# Patient Record
Sex: Male | Born: 1960
Health system: Southern US, Community
[De-identification: ages and names within clinical notes are randomized; demographics above are authoritative.]

## PROBLEM LIST (undated history)

## (undated) ENCOUNTER — Inpatient Hospital Stay: Discharge: 2018-03-11 | Payer: PRIVATE HEALTH INSURANCE

## (undated) DIAGNOSIS — M960 Pseudarthrosis after fusion or arthrodesis: Secondary | ICD-10-CM

## (undated) DIAGNOSIS — M47812 Spondylosis without myelopathy or radiculopathy, cervical region: Secondary | ICD-10-CM

## (undated) DIAGNOSIS — M47816 Spondylosis without myelopathy or radiculopathy, lumbar region: Secondary | ICD-10-CM

## (undated) DIAGNOSIS — R202 Paresthesia of skin: Secondary | ICD-10-CM

## (undated) DIAGNOSIS — C9110 Chronic lymphocytic leukemia of B-cell type not having achieved remission: Principal | ICD-10-CM

## (undated) DIAGNOSIS — Z9889 Other specified postprocedural states: Secondary | ICD-10-CM

## (undated) DIAGNOSIS — I70213 Atherosclerosis of native arteries of extremities with intermittent claudication, bilateral legs: Secondary | ICD-10-CM

## (undated) DIAGNOSIS — Z01818 Encounter for other preprocedural examination: Secondary | ICD-10-CM

## (undated) DIAGNOSIS — E049 Nontoxic goiter, unspecified: Secondary | ICD-10-CM

## (undated) DIAGNOSIS — R1010 Upper abdominal pain, unspecified: Secondary | ICD-10-CM

## (undated) DIAGNOSIS — M4312 Spondylolisthesis, cervical region: Secondary | ICD-10-CM

## (undated) DIAGNOSIS — R52 Pain, unspecified: Principal | ICD-10-CM

## (undated) DIAGNOSIS — R1084 Generalized abdominal pain: Principal | ICD-10-CM

## (undated) DIAGNOSIS — F32A Depression, unspecified: Secondary | ICD-10-CM

## (undated) DIAGNOSIS — N419 Inflammatory disease of prostate, unspecified: Secondary | ICD-10-CM

## (undated) DIAGNOSIS — K219 Gastro-esophageal reflux disease without esophagitis: Secondary | ICD-10-CM

## (undated) DIAGNOSIS — R109 Unspecified abdominal pain: Secondary | ICD-10-CM

## (undated) DIAGNOSIS — G8929 Other chronic pain: Secondary | ICD-10-CM

## (undated) DIAGNOSIS — K589 Irritable bowel syndrome without diarrhea: Secondary | ICD-10-CM

## (undated) DIAGNOSIS — I739 Peripheral vascular disease, unspecified: Secondary | ICD-10-CM

## (undated) DIAGNOSIS — K859 Acute pancreatitis without necrosis or infection, unspecified: Secondary | ICD-10-CM

## (undated) DIAGNOSIS — M549 Dorsalgia, unspecified: Secondary | ICD-10-CM

## (undated) DIAGNOSIS — I2510 Atherosclerotic heart disease of native coronary artery without angina pectoris: Secondary | ICD-10-CM

## (undated) DIAGNOSIS — F329 Major depressive disorder, single episode, unspecified: Secondary | ICD-10-CM

## (undated) DIAGNOSIS — E119 Type 2 diabetes mellitus without complications: Secondary | ICD-10-CM

## (undated) DIAGNOSIS — I10 Essential (primary) hypertension: Secondary | ICD-10-CM

## (undated) HISTORY — DX: Inflammatory disease of prostate, unspecified: N41.9

## (undated) HISTORY — DX: Dorsalgia, unspecified: M54.9

## (undated) HISTORY — DX: Other chronic pain: G89.29

## (undated) HISTORY — DX: Peripheral vascular disease, unspecified: I73.9

---

## 2014-08-21 ENCOUNTER — Encounter (HOSPITAL_BASED_OUTPATIENT_CLINIC_OR_DEPARTMENT_OTHER): Payer: Self-pay

## 2014-08-21 ENCOUNTER — Emergency Department (HOSPITAL_BASED_OUTPATIENT_CLINIC_OR_DEPARTMENT_OTHER)
Admission: EM | Admit: 2014-08-21 | Discharge: 2014-08-21 | Disposition: A | Discharge status: Home / Self Care | Payer: Self-pay | Attending: Emergency Medicine | Admitting: Emergency Medicine

## 2014-08-21 DIAGNOSIS — Z79899 Other long term (current) drug therapy: Secondary | ICD-10-CM | POA: Insufficient documentation

## 2014-08-21 DIAGNOSIS — K0889 Other specified disorders of teeth and supporting structures: Secondary | ICD-10-CM

## 2014-08-21 DIAGNOSIS — K029 Dental caries, unspecified: Secondary | ICD-10-CM | POA: Insufficient documentation

## 2014-08-21 DIAGNOSIS — Z720 Tobacco use: Secondary | ICD-10-CM | POA: Insufficient documentation

## 2014-08-21 DIAGNOSIS — K219 Gastro-esophageal reflux disease without esophagitis: Secondary | ICD-10-CM | POA: Insufficient documentation

## 2014-08-21 DIAGNOSIS — I2510 Atherosclerotic heart disease of native coronary artery without angina pectoris: Secondary | ICD-10-CM | POA: Insufficient documentation

## 2014-08-21 DIAGNOSIS — K088 Other specified disorders of teeth and supporting structures: Secondary | ICD-10-CM | POA: Insufficient documentation

## 2014-08-21 DIAGNOSIS — I10 Essential (primary) hypertension: Secondary | ICD-10-CM | POA: Insufficient documentation

## 2014-08-21 DIAGNOSIS — E119 Type 2 diabetes mellitus without complications: Secondary | ICD-10-CM | POA: Insufficient documentation

## 2014-08-21 HISTORY — DX: Irritable bowel syndrome, unspecified: K58.9

## 2014-08-21 HISTORY — DX: Type 2 diabetes mellitus without complications: E11.9

## 2014-08-21 HISTORY — DX: Atherosclerotic heart disease of native coronary artery without angina pectoris: I25.10

## 2014-08-21 HISTORY — DX: Gastro-esophageal reflux disease without esophagitis: K21.9

## 2014-08-21 HISTORY — DX: Essential (primary) hypertension: I10

## 2014-08-21 MED ORDER — PENICILLIN V POTASSIUM 500 MG PO TABS: 1000.0000 mg | ORAL_TABLET | Freq: Two times a day (BID) | ORAL | Status: DC

## 2014-08-21 MED ORDER — BUPIVACAINE HCL 0.25 % IJ SOLN: 10.0000 mL | Freq: Once | INTRAMUSCULAR | Status: DC

## 2014-08-21 MED ORDER — BUPIVACAINE-EPINEPHRINE (PF) 0.5% -1:200000 IJ SOLN
INTRAMUSCULAR | Status: AC
  Administered 2014-08-21: 1.8 mL
  Filled 2014-08-21: qty 1.8

## 2014-08-21 MED ORDER — ACETAMINOPHEN 500 MG PO TABS
1000.0000 mg | ORAL_TABLET | Freq: Once | ORAL | Status: AC
  Administered 2014-08-21: 1000 mg via ORAL
  Filled 2014-08-21: qty 2

## 2014-08-21 MED ORDER — IBUPROFEN 800 MG PO TABS
800.0000 mg | ORAL_TABLET | Freq: Once | ORAL | Status: AC
  Administered 2014-08-21: 800 mg via ORAL
  Filled 2014-08-21: qty 1

## 2014-08-21 MED ORDER — OXYCODONE HCL 5 MG PO TABS
5.0000 mg | ORAL_TABLET | Freq: Once | ORAL | Status: AC
  Administered 2014-08-21: 5 mg via ORAL
  Filled 2014-08-21: qty 1

## 2014-08-21 NOTE — ED Notes (Signed)
Pt reports left sided dental pain, extensive dental decay.  Reports yesterday face was swollen.

## 2014-08-21 NOTE — Discharge Instructions (Signed)
Dental list          updated 1.22.15 These dentists all accept Medicaid.  The list is for your convenience in choosing your childs dentist. Estos dentistas aceptan Medicaid.  La lista es para su [place] y es una cortesa.     [facility]     [phone] [address]  [address] [place] [address] Se habla espaol From 4 to 53 years old Parent may go with child Vinson Moselle DDS     [phone] [address] [place]  [address] Se habla espaol From 44 to 56 years old Parent may NOT go with child  Marolyn Hammock DMD    [phone] [address] [place] [address] Se habla espaol [place] spoken From 55 years old Parent may go with child [facility]     [phone] [address] [place] [address] Se habla espaol From 48 to 23 years old Parent may NOT go with child  Winfield Rast DDS     [phone] [facility]      [address]  Gin[place] [address] No se habla espaol From teeth coming in Parent may go with child  [facility]     [phone] [address] [place]. [place] [address] Requires certification. Call for information. Requiere certificacin. Llame para informacin. Algunos dias se habla espaol  From birth to 20 years Parent possibly goes with child  Bradd Canary DDS     [phone] [id number]  [address] [place] [address] Se habla espaol From 18 months to 18 years  Parent may go with child  J. Cos Cob DDS    [phone] Garlon Hatchet DDS [address] [place] [address] Se habla espaol From 70 year old Parent may go with child  Melynda Ripple DDS    [phone] [address] [place] [address] Se habla espaol  From 32 months old Parent may go with child Dorian Pod DDS    [phone] [address] [place] [address] Se habla espaol From 88 to 87 years old Parent may go with child  [facility]    [phone] [address] [place] [address] No se habla espaol From birth Parent may not go with child     Dental Pain A tooth ache may be caused by cavities (tooth decay). Cavities expose the nerve of the tooth to air and hot or cold temperatures. It may come from an infection or abscess (also called a boil or furuncle) around your tooth. It is also often caused by dental caries (tooth decay). This causes the pain you are having. DIAGNOSIS  Your caregiver can diagnose this problem by exam. TREATMENT   If caused by an infection, it may be treated with medications which kill germs (antibiotics) and pain medications as prescribed by your caregiver. Take medications as directed.  Only take over-the-counter or prescription medicines for pain, discomfort, or fever as directed by your caregiver.  Whether the tooth ache today is caused by infection or dental disease, you should see your dentist as soon as possible for further care. SEEK MEDICAL CARE IF: The exam and treatment you received today has been provided on an emergency basis only. This is not a substitute for complete medical or dental care. If your problem worsens or new problems (symptoms) appear, and you are unable to meet with your dentist, call or return to this location. SEEK IMMEDIATE MEDICAL  CARE IF:   You have a fever.  You develop redness and swelling of your face, jaw, or neck.  You are unable to open your mouth.  You have severe pain uncontrolled by pain medicine. MAKE SURE YOU:   Understand these instructions.  Will watch your condition.  Will get help right away if you are not doing well or get worse. Document Released: 01/09/2005 Document Revised: 04/03/2011 Document Reviewed: 08/28/2007 [facility] Patient Information 2015 [place], [place]. This information is not intended to replace advice given to you by your health care provider. Make sure you discuss any questions you have with your  health care provider.

## 2014-08-21 NOTE — ED Provider Notes (Signed)
CSN: [id number]     Arrival date & time 08/21/14  1609 History   First MD Initiated Contact with Patient 08/21/14 1700     Chief Complaint  Patient presents with  . Dental Pain     (Consider location/radiation/quality/duration/timing/severity/associated sxs/prior Treatment) Patient is a 54 y.o. male presenting with tooth pain. The history is provided by the patient.  Dental Pain Location:  Upper, generalized and lower Upper teeth location:  12/LU 1st bicuspid Lower teeth location:  19/LL 1st molar and 18/LL 2nd molar Quality:  Sharp and constant Severity:  Severe Onset quality:  Gradual Duration:  2 days Timing:  Constant Progression:  Worsening Chronicity:  New Previous work-up:  Dental exam Relieved by:  Nothing Worsened by:  Nothing tried Ineffective treatments:  None tried Associated symptoms: facial pain, facial swelling and gum swelling   Associated symptoms: no congestion, no fever and no headaches     Past Medical History  Diagnosis Date  . Diabetes mellitus without complication   . IBS (irritable bowel syndrome)   . CAD (coronary artery disease)   . Hypertension   . GERD (gastroesophageal reflux disease)    History reviewed. No pertinent past surgical history. No family history on file. History  Substance Use Topics  . Smoking status: Current Every Day Smoker -- 0.50 packs/day    Types: Cigarettes  . Smokeless tobacco: Not on file  . Alcohol Use: No    Review of Systems  Constitutional: Negative for fever and chills.  HENT: Positive for dental problem and facial swelling. Negative for congestion.   Eyes: Negative for discharge and visual disturbance.  Respiratory: Negative for shortness of breath.   Cardiovascular: Negative for chest pain and palpitations.  Gastrointestinal: Negative for vomiting, abdominal pain and diarrhea.  Musculoskeletal: Negative for myalgias and arthralgias.  Skin: Negative for color change and rash.  Neurological: Negative for  tremors, syncope and headaches.  Psychiatric/Behavioral: Negative for confusion and dysphoric mood.      Allergies  Review of patient's allergies indicates no known allergies.  Home Medications   Prior to Admission medications   Medication Sig Start Date End Date Taking? Authorizing Provider  dicyclomine (BENTYL) 10 MG capsule Take 10 mg by mouth 4 (four) times daily -  before meals and at bedtime.   Yes Historical Provider, MD  ibuprofen (ADVIL,MOTRIN) 400 MG tablet Take 400 mg by mouth every 6 (six) hours as needed.   Yes Historical Provider, MD  lisinopril (PRINIVIL,ZESTRIL) 10 MG tablet Take 10 mg by mouth daily.   Yes Historical Provider, MD  metFORMIN (GLUCOPHAGE) 1000 MG tablet Take 1,000 mg by mouth 2 (two) times daily with a meal.   Yes Historical Provider, MD  omeprazole (PRILOSEC) 10 MG capsule Take 10 mg by mouth daily.   Yes Historical Provider, MD  rosuvastatin (CRESTOR) 10 MG tablet Take 10 mg by mouth daily.   Yes Historical Provider, MD  penicillin v potassium (VEETID) 500 MG tablet Take 2 tablets (1,000 mg total) by mouth 2 (two) times daily. X 7 days 08/21/14   Melene Plan, DO   BP 130/70 mmHg  Pulse 82  Temp(Src) 98.1 F (36.7 C) (Oral)  Resp 22  Ht  (1.676 m)  Wt 166 lb (75.297 kg)  BMI 26.81 kg/m2  SpO2 97% Physical Exam  Constitutional: He is oriented to person, place, and time. He appears well-developed and well-nourished.  HENT:  Head: Normocephalic and atraumatic.  Poor dentition multiple dental caries mild fluctuance above left upper canine.  Noted swelling to the floor of the mouth. Able to handle secretions without difficulty.  Eyes: EOM are normal. Pupils are equal, round, and reactive to light.  Neck: Normal range of motion. Neck supple. No JVD present.  Pulmonary/Chest: No respiratory distress.  Musculoskeletal: Normal range of motion.  Neurological: He is alert and oriented to person, place, and time.  Skin: No rash noted. No pallor.   Psychiatric: He has a normal mood and affect. His behavior is normal.    ED Course  Dental Date/Time: 08/21/2014 5:41 PM Performed by: Melene Plan Authorized by: Melene Plan Consent: Verbal consent obtained. Risks and benefits: risks, benefits and alternatives were discussed Consent given by: patient Patient understanding: patient states understanding of the procedure being performed Patient consent: the patient's understanding of the procedure matches consent given Required items: required blood products, implants, devices, and special equipment available Time out: Immediately prior to procedure a "time out" was called to verify the correct patient, procedure, equipment, support staff and site/side marked as required. Local anesthesia used: yes Anesthesia: nerve block Local anesthetic: bupivacaine 0.25% with epinephrine Anesthetic total: 10 ml Patient sedated: no Patient tolerance: Patient tolerated the procedure well with no immediate complications Comments: I&D without drainage  NERVE BLOCK Date/Time: 08/21/2014 5:42 PM Performed by: Melene Plan Authorized by: Melene Plan Consent: Verbal consent obtained. Risks and benefits: risks, benefits and alternatives were discussed Consent given by: patient Required items: required blood products, implants, devices, and special equipment available Indications: pain relief Body area: face/mouth Nerve: inferior alveolar Laterality: left Patient sedated: no Patient position: sitting Needle gauge: 27 G Location technique: anatomical landmarks Local anesthetic: bupivacaine 0.25% with epinephrine Anesthetic total: 5 ml Outcome: pain improved Patient tolerance: Patient tolerated the procedure well with no immediate complications   (including critical care time) Labs Review Labs Reviewed - No data to display  Imaging Review No results found.   EKG Interpretation None      MDM   Final diagnoses:  Pain, dental       54 year old male  with dental pain started 2 days ago. Mild facial swelling has resolved according to patient. Able to handle secretions without difficulty. No signs of ludwigs angina. Dental block was performed at bedside with relief of pain. We'll have the patient follow-up with his dentist. 5:42 PM:  I have discussed the diagnosis/risks/treatment options with the patient and family and believe the pt to be eligible for discharge home to follow-up with Dentist. We also discussed returning to the ED immediately if new or worsening sx occur. We discussed the sx which are most concerning (e.g., sudden worsening pain, fever,nausea) that necessitate immediate return. Medications administered to the patient during their visit and any new prescriptions provided to the patient are listed below.  Medications given during this visit Medications  acetaminophen (TYLENOL) tablet 1,000 mg (1,000 mg Oral Given 08/21/14 1730)  ibuprofen (ADVIL,MOTRIN) tablet 800 mg (800 mg Oral Given 08/21/14 1730)  oxyCODONE (Oxy IR/ROXICODONE) immediate release tablet 5 mg (5 mg Oral Given 08/21/14 1730)  bupivacaine-epinephrine (MARCAINE W/ EPI) 0.5% -1:200000 injection (1.8 mLs  Given 08/21/14 1735)    New Prescriptions   PENICILLIN V POTASSIUM (VEETID) 500 MG TABLET    Take 2 tablets (1,000 mg total) by mouth 2 (two) times daily. X 7 days     The patient appears reasonably screen and/or stabilized for discharge and I doubt any other medical condition or other [facility] requiring further screening, evaluation, or treatment in the ED at this time prior  to discharge.    Melene Plan, DO 08/21/14 1742

## 2014-08-27 ENCOUNTER — Emergency Department (HOSPITAL_BASED_OUTPATIENT_CLINIC_OR_DEPARTMENT_OTHER): Payer: Self-pay

## 2014-08-27 ENCOUNTER — Encounter (HOSPITAL_BASED_OUTPATIENT_CLINIC_OR_DEPARTMENT_OTHER): Payer: Self-pay

## 2014-08-27 ENCOUNTER — Emergency Department (HOSPITAL_BASED_OUTPATIENT_CLINIC_OR_DEPARTMENT_OTHER)
Admission: EM | Admit: 2014-08-27 | Discharge: 2014-08-27 | Disposition: A | Discharge status: Home / Self Care | Payer: Self-pay | Attending: Emergency Medicine | Admitting: Emergency Medicine

## 2014-08-27 DIAGNOSIS — R1084 Generalized abdominal pain: Secondary | ICD-10-CM | POA: Insufficient documentation

## 2014-08-27 DIAGNOSIS — K589 Irritable bowel syndrome without diarrhea: Secondary | ICD-10-CM | POA: Insufficient documentation

## 2014-08-27 DIAGNOSIS — Z720 Tobacco use: Secondary | ICD-10-CM | POA: Insufficient documentation

## 2014-08-27 DIAGNOSIS — Z79899 Other long term (current) drug therapy: Secondary | ICD-10-CM | POA: Insufficient documentation

## 2014-08-27 DIAGNOSIS — I2510 Atherosclerotic heart disease of native coronary artery without angina pectoris: Secondary | ICD-10-CM | POA: Insufficient documentation

## 2014-08-27 DIAGNOSIS — E119 Type 2 diabetes mellitus without complications: Secondary | ICD-10-CM | POA: Insufficient documentation

## 2014-08-27 DIAGNOSIS — K219 Gastro-esophageal reflux disease without esophagitis: Secondary | ICD-10-CM | POA: Insufficient documentation

## 2014-08-27 DIAGNOSIS — I10 Essential (primary) hypertension: Secondary | ICD-10-CM | POA: Insufficient documentation

## 2014-08-27 LAB — COMPREHENSIVE METABOLIC PANEL
ALK PHOS: 51 U/L (ref 38–126)
ALT: 13 U/L — AB (ref 17–63)
ANION GAP: 13 (ref 5–15)
AST: 19 U/L (ref 15–41)
Albumin: 4.9 g/dL (ref 3.5–5.0)
BUN: 13 mg/dL (ref 6–20)
CO2: 25 mmol/L (ref 22–32)
CREATININE: 0.95 mg/dL (ref 0.61–1.24)
Calcium: 9.7 mg/dL (ref 8.9–10.3)
Chloride: 105 mmol/L (ref 101–111)
Glucose, Bld: 183 mg/dL — ABNORMAL HIGH (ref 65–99)
Potassium: 4.1 mmol/L (ref 3.5–5.1)
Sodium: 143 mmol/L (ref 135–145)
Total Bilirubin: 0.6 mg/dL (ref 0.3–1.2)
Total Protein: 8.5 g/dL — ABNORMAL HIGH (ref 6.5–8.1)

## 2014-08-27 LAB — CBC WITH DIFFERENTIAL/PLATELET
BASOS PCT: 0 % (ref 0–1)
Basophils Absolute: 0 10*3/uL (ref 0.0–0.1)
Eosinophils Absolute: 0 10*3/uL (ref 0.0–0.7)
Eosinophils Relative: 0 % (ref 0–5)
HCT: 44.3 % (ref 39.0–52.0)
Hemoglobin: 14.9 g/dL (ref 13.0–17.0)
LYMPHS PCT: 37 % (ref 12–46)
Lymphs Abs: 6.6 10*3/uL — ABNORMAL HIGH (ref 0.7–4.0)
MCH: 29 pg (ref 26.0–34.0)
MCHC: 33.6 g/dL (ref 30.0–36.0)
MCV: 86.2 fL (ref 78.0–100.0)
Monocytes Absolute: 0.5 10*3/uL (ref 0.1–1.0)
Monocytes Relative: 3 % (ref 3–12)
Neutro Abs: 10.7 10*3/uL — ABNORMAL HIGH (ref 1.7–7.7)
Neutrophils Relative %: 60 % (ref 43–77)
Platelets: 404 10*3/uL — ABNORMAL HIGH (ref 150–400)
RBC: 5.14 MIL/uL (ref 4.22–5.81)
RDW: 14.6 % (ref 11.5–15.5)
WBC: 17.8 10*3/uL — ABNORMAL HIGH (ref 4.0–10.5)

## 2014-08-27 LAB — LIPASE, BLOOD: LIPASE: 25 U/L (ref 22–51)

## 2014-08-27 MED ORDER — HYDROCODONE-ACETAMINOPHEN 5-325 MG PO TABS: 1.0000 | ORAL_TABLET | Freq: Four times a day (QID) | ORAL | Status: DC | PRN

## 2014-08-27 MED ORDER — MORPHINE SULFATE 4 MG/ML IJ SOLN
4.0000 mg | Freq: Once | INTRAMUSCULAR | Status: AC
  Administered 2014-08-27: 4 mg via INTRAVENOUS
  Filled 2014-08-27: qty 1

## 2014-08-27 MED ORDER — MORPHINE SULFATE 4 MG/ML IJ SOLN
4.0000 mg | Freq: Once | INTRAMUSCULAR | Status: AC
  Administered 2014-08-27: 4 mg via INTRAVENOUS

## 2014-08-27 MED ORDER — ONDANSETRON HCL 4 MG/2ML IJ SOLN
4.0000 mg | Freq: Once | INTRAMUSCULAR | Status: AC
  Administered 2014-08-27: 4 mg via INTRAVENOUS
  Filled 2014-08-27: qty 2

## 2014-08-27 MED ORDER — IOHEXOL 300 MG/ML  SOLN
100.0000 mL | Freq: Once | INTRAMUSCULAR | Status: AC | PRN
  Administered 2014-08-27: 100 mL via INTRAVENOUS

## 2014-08-27 MED ORDER — ONDANSETRON HCL 4 MG/2ML IJ SOLN
INTRAMUSCULAR | Status: AC
  Administered 2014-08-27: 4 mg
  Filled 2014-08-27: qty 2

## 2014-08-27 MED ORDER — ONDANSETRON 8 MG PO TBDP: ORAL_TABLET | ORAL | Status: DC

## 2014-08-27 MED ORDER — PROMETHAZINE HCL 25 MG/ML IJ SOLN
12.5000 mg | Freq: Once | INTRAMUSCULAR | Status: AC
  Administered 2014-08-27: 12.5 mg via INTRAVENOUS
  Filled 2014-08-27: qty 1

## 2014-08-27 MED ORDER — IOHEXOL 300 MG/ML  SOLN
25.0000 mL | Freq: Once | INTRAMUSCULAR | Status: AC | PRN
  Administered 2014-08-27: 25 mL via ORAL

## 2014-08-27 MED ORDER — MORPHINE SULFATE 4 MG/ML IJ SOLN
INTRAMUSCULAR | Status: AC
  Filled 2014-08-27: qty 1

## 2014-08-27 NOTE — ED Notes (Signed)
Update given on blood pressure to Dr. Judd Lien.  OK to discharge.

## 2014-08-27 NOTE — ED Notes (Signed)
abd pain x 1 week- n/v-constipation with laxative use-last BM today

## 2014-08-27 NOTE — Discharge Instructions (Signed)
Hydrocodone as prescribed as needed for pain. Zofran as prescribed as needed for nausea.  Follow-up with your primary Dr. if not improving in the next several days, and return to the ER if symptoms significantly worsen or change.   Abdominal Pain Many things can cause abdominal pain. Usually, abdominal pain is not caused by a disease and will improve without treatment. It can often be observed and treated at home. Your health care provider will do a physical exam and possibly order blood tests and X-rays to help determine the seriousness of your pain. However, in many cases, more time must pass before a clear cause of the pain can be found. Before that point, your health care provider may not know if you need more testing or further treatment. HOME CARE INSTRUCTIONS  Monitor your abdominal pain for any changes. The following actions may help to alleviate any discomfort you are experiencing:  Only take over-the-counter or prescription medicines as directed by your health care provider.  Do not take laxatives unless directed to do so by your health care provider.  Try a clear liquid diet (broth, tea, or water) as directed by your health care provider. Slowly move to a bland diet as tolerated. SEEK MEDICAL CARE IF:  You have unexplained abdominal pain.  You have abdominal pain associated with nausea or diarrhea.  You have pain when you urinate or have a bowel movement.  You experience abdominal pain that wakes you in the night.  You have abdominal pain that is worsened or improved by eating food.  You have abdominal pain that is worsened with eating fatty foods.  You have a fever. SEEK IMMEDIATE MEDICAL CARE IF:   Your pain does not go away within 2 hours.  You keep throwing up (vomiting).  Your pain is felt only in portions of the abdomen, such as the right side or the left lower portion of the abdomen.  You pass bloody or black tarry stools. MAKE SURE YOU:  Understand these  instructions.   Will watch your condition.   Will get help right away if you are not doing well or get worse.  Document Released: 10/19/2004 Document Revised: 01/14/2013 Document Reviewed: 09/18/2012 [facility] Patient Information 2015 [place], [place]. This information is not intended to replace advice given to you by your health care provider. Make sure you discuss any questions you have with your health care provider.

## 2014-08-27 NOTE — ED Provider Notes (Signed)
CSN: [id number]     Arrival date & time 08/27/14  1430 History   First MD Initiated Contact with Patient 08/27/14 1522     Chief Complaint  Patient presents with  . Abdominal Pain     (Consider location/radiation/quality/duration/timing/severity/associated sxs/prior Treatment) HPI Comments: Patient is a 54 year old male with prior history of pancreatitis, diabetes, and hypertension, but no prior abdominal surgeries. He presents for evaluation of abdominal pain which has worsened over the past week. He tells me initially it started in the lower half of his abdomen but now hurts all over. He reports vomiting but no diarrhea and actually believes he may be constipated. He denies any urinary complaints. He denies any fevers or chills.  Patient is a 54 y.o. male presenting with abdominal pain. The history is provided by the patient.  Abdominal Pain Pain location:  Generalized Pain quality: cramping   Pain radiates to:  Does not radiate Pain severity:  Severe Onset quality:  Gradual Duration:  1 week Timing:  Constant Progression:  Worsening Chronicity:  Recurrent Relieved by:  Nothing Worsened by:  Nothing tried Ineffective treatments:  None tried   Past Medical History  Diagnosis Date  . Diabetes mellitus without complication   . IBS (irritable bowel syndrome)   . CAD (coronary artery disease)   . Hypertension   . GERD (gastroesophageal reflux disease)    History reviewed. No pertinent past surgical history. No family history on file. History  Substance Use Topics  . Smoking status: Current Every Day Smoker -- 0.50 packs/day    Types: Cigarettes  . Smokeless tobacco: Not on file  . Alcohol Use: No    Review of Systems  Gastrointestinal: Positive for abdominal pain.  All other systems reviewed and are negative.     Allergies  Review of patient's allergies indicates no known allergies.  Home Medications   Prior to Admission medications   Medication Sig Start Date  End Date Taking? Authorizing Provider  dicyclomine (BENTYL) 10 MG capsule Take 10 mg by mouth 4 (four) times daily -  before meals and at bedtime.    Historical Provider, MD  ibuprofen (ADVIL,MOTRIN) 400 MG tablet Take 400 mg by mouth every 6 (six) hours as needed.    Historical Provider, MD  lisinopril (PRINIVIL,ZESTRIL) 10 MG tablet Take 10 mg by mouth daily.    Historical Provider, MD  metFORMIN (GLUCOPHAGE) 1000 MG tablet Take 1,000 mg by mouth 2 (two) times daily with a meal.    Historical Provider, MD  omeprazole (PRILOSEC) 10 MG capsule Take 10 mg by mouth daily.    Historical Provider, MD  rosuvastatin (CRESTOR) 10 MG tablet Take 10 mg by mouth daily.    Historical Provider, MD   BP 188/97 mmHg  Pulse 73  Temp(Src) 98.2 F (36.8 C) (Oral)  Resp 18  Ht 5\' 6"  (1.676 m)  Wt 165 lb (74.844 kg)  BMI 26.64 kg/m2  SpO2 98% Physical Exam  Constitutional: He is oriented to person, place, and time. He appears well-developed and well-nourished. No distress.  HENT:  Head: Normocephalic and atraumatic.  Neck: Normal range of motion. Neck supple.  Cardiovascular: Normal rate, regular rhythm and normal heart sounds.   No murmur heard. Pulmonary/Chest: Effort normal and breath sounds normal. No respiratory distress. He has no wheezes.  Abdominal: Soft. Bowel sounds are normal. He exhibits no distension. There is tenderness. There is no rebound and no guarding.  There is tenderness to palpation in all 4 quadrants. There is no rebound  and no guarding.  Musculoskeletal: Normal range of motion. He exhibits no edema.  Neurological: He is alert and oriented to person, place, and time.  Skin: Skin is warm and dry. He is not diaphoretic.  Nursing note and vitals reviewed.   ED Course  Procedures (including critical care time) Labs Review Labs Reviewed  CBC WITH DIFFERENTIAL/PLATELET - Abnormal; Notable for the following:    WBC 17.8 (*)    Platelets 404 (*)    All other components within  normal limits  COMPREHENSIVE METABOLIC PANEL  LIPASE, BLOOD    Imaging Review No results found.   EKG Interpretation None      MDM   Final diagnoses:  None    Workup reveals a leukocytosis of 18,000, however CT scan is unremarkable for any acute intra-abdominal process. He reports a history of pancreatitis, however there is no radiographic evidence of this and his lipase is normal. He tells me he has several episodes per year of abdominal pain flareups and this feels similar. He is feeling somewhat better with medications here in the ER and I believe is appropriate for discharge.    Geoffery Lyons, MD 08/27/14 [phone]

## 2014-08-28 LAB — PATHOLOGIST SMEAR REVIEW: PATH REVIEW: REACTIVE

## 2014-08-31 ENCOUNTER — Encounter (HOSPITAL_BASED_OUTPATIENT_CLINIC_OR_DEPARTMENT_OTHER): Payer: Self-pay

## 2014-08-31 ENCOUNTER — Emergency Department (HOSPITAL_BASED_OUTPATIENT_CLINIC_OR_DEPARTMENT_OTHER)
Admission: EM | Admit: 2014-08-31 | Discharge: 2014-08-31 | Disposition: A | Discharge status: Home / Self Care | Payer: Self-pay | Attending: Emergency Medicine | Admitting: Emergency Medicine

## 2014-08-31 DIAGNOSIS — F1210 Cannabis abuse, uncomplicated: Secondary | ICD-10-CM | POA: Insufficient documentation

## 2014-08-31 DIAGNOSIS — F1110 Opioid abuse, uncomplicated: Secondary | ICD-10-CM | POA: Insufficient documentation

## 2014-08-31 DIAGNOSIS — K5900 Constipation, unspecified: Secondary | ICD-10-CM | POA: Insufficient documentation

## 2014-08-31 DIAGNOSIS — R1084 Generalized abdominal pain: Secondary | ICD-10-CM | POA: Insufficient documentation

## 2014-08-31 DIAGNOSIS — R112 Nausea with vomiting, unspecified: Secondary | ICD-10-CM | POA: Insufficient documentation

## 2014-08-31 DIAGNOSIS — E119 Type 2 diabetes mellitus without complications: Secondary | ICD-10-CM | POA: Insufficient documentation

## 2014-08-31 DIAGNOSIS — Z720 Tobacco use: Secondary | ICD-10-CM | POA: Insufficient documentation

## 2014-08-31 DIAGNOSIS — R630 Anorexia: Secondary | ICD-10-CM | POA: Insufficient documentation

## 2014-08-31 DIAGNOSIS — Z79899 Other long term (current) drug therapy: Secondary | ICD-10-CM | POA: Insufficient documentation

## 2014-08-31 DIAGNOSIS — I2510 Atherosclerotic heart disease of native coronary artery without angina pectoris: Secondary | ICD-10-CM | POA: Insufficient documentation

## 2014-08-31 DIAGNOSIS — I10 Essential (primary) hypertension: Secondary | ICD-10-CM | POA: Insufficient documentation

## 2014-08-31 DIAGNOSIS — R109 Unspecified abdominal pain: Secondary | ICD-10-CM

## 2014-08-31 DIAGNOSIS — K219 Gastro-esophageal reflux disease without esophagitis: Secondary | ICD-10-CM | POA: Insufficient documentation

## 2014-08-31 LAB — COMPREHENSIVE METABOLIC PANEL
ALT: 14 U/L — ABNORMAL LOW (ref 17–63)
AST: 21 U/L (ref 15–41)
Albumin: 4.7 g/dL (ref 3.5–5.0)
Alkaline Phosphatase: 53 U/L (ref 38–126)
Anion gap: 11 (ref 5–15)
BILIRUBIN TOTAL: 0.7 mg/dL (ref 0.3–1.2)
BUN: 12 mg/dL (ref 6–20)
CO2: 24 mmol/L (ref 22–32)
Calcium: 9.7 mg/dL (ref 8.9–10.3)
Chloride: 106 mmol/L (ref 101–111)
Creatinine, Ser: 0.9 mg/dL (ref 0.61–1.24)
GLUCOSE: 173 mg/dL — AB (ref 65–99)
POTASSIUM: 4.1 mmol/L (ref 3.5–5.1)
Sodium: 141 mmol/L (ref 135–145)
TOTAL PROTEIN: 7.5 g/dL (ref 6.5–8.1)

## 2014-08-31 LAB — CBC WITH DIFFERENTIAL/PLATELET
BASOS ABS: 0 10*3/uL (ref 0.0–0.1)
Basophils Relative: 0 % (ref 0–1)
EOS ABS: 0 10*3/uL (ref 0.0–0.7)
Eosinophils Relative: 0 % (ref 0–5)
HCT: 45.1 % (ref 39.0–52.0)
Hemoglobin: 15.3 g/dL (ref 13.0–17.0)
LYMPHS ABS: 6.3 10*3/uL — AB (ref 0.7–4.0)
LYMPHS PCT: 34 % (ref 12–46)
MCH: 29.1 pg (ref 26.0–34.0)
MCHC: 33.9 g/dL (ref 30.0–36.0)
MCV: 85.9 fL (ref 78.0–100.0)
MYELOCYTES: 1 %
Monocytes Absolute: 0.4 10*3/uL (ref 0.1–1.0)
Monocytes Relative: 2 % — ABNORMAL LOW (ref 3–12)
Neutro Abs: 11.7 10*3/uL — ABNORMAL HIGH (ref 1.7–7.7)
Neutrophils Relative %: 63 % (ref 43–77)
Platelets: 401 10*3/uL — ABNORMAL HIGH (ref 150–400)
RBC: 5.25 MIL/uL (ref 4.22–5.81)
RDW: 14.5 % (ref 11.5–15.5)
WBC: 18.4 10*3/uL — ABNORMAL HIGH (ref 4.0–10.5)

## 2014-08-31 LAB — URINALYSIS, ROUTINE W REFLEX MICROSCOPIC
Glucose, UA: NEGATIVE mg/dL
Hgb urine dipstick: NEGATIVE
KETONES UR: 15 mg/dL — AB
Leukocytes, UA: NEGATIVE
Nitrite: NEGATIVE
Protein, ur: 30 mg/dL — AB
Specific Gravity, Urine: 1.029 (ref 1.005–1.030)
UROBILINOGEN UA: 1 mg/dL (ref 0.0–1.0)
pH: 6 (ref 5.0–8.0)

## 2014-08-31 LAB — RAPID URINE DRUG SCREEN, HOSP PERFORMED
AMPHETAMINES: NOT DETECTED
Barbiturates: NOT DETECTED
Benzodiazepines: NOT DETECTED
Cocaine: NOT DETECTED
Opiates: POSITIVE — AB
TETRAHYDROCANNABINOL: POSITIVE — AB

## 2014-08-31 LAB — TROPONIN I: Troponin I: <0.03 ng/mL

## 2014-08-31 LAB — URINE MICROSCOPIC-ADD ON

## 2014-08-31 LAB — LIPASE, BLOOD: LIPASE: 19 U/L — AB (ref 22–51)

## 2014-08-31 LAB — I-STAT CG4 LACTIC ACID, ED: Lactic Acid, Venous: 2.01 mmol/L (ref 0.5–2.0)

## 2014-08-31 LAB — ETHANOL

## 2014-08-31 MED ORDER — KETOROLAC TROMETHAMINE 30 MG/ML IJ SOLN
30.0000 mg | Freq: Once | INTRAMUSCULAR | Status: AC
  Administered 2014-08-31: 30 mg via INTRAVENOUS
  Filled 2014-08-31: qty 1

## 2014-08-31 MED ORDER — SODIUM CHLORIDE 0.9 % IV BOLUS (SEPSIS)
1000.0000 mL | Freq: Once | INTRAVENOUS | Status: AC
  Administered 2014-08-31: 1000 mL via INTRAVENOUS

## 2014-08-31 MED ORDER — HYDROMORPHONE HCL 1 MG/ML IJ SOLN
2.0000 mg | Freq: Once | INTRAMUSCULAR | Status: AC
  Administered 2014-08-31: 2 mg via INTRAVENOUS
  Filled 2014-08-31: qty 2

## 2014-08-31 MED ORDER — ONDANSETRON 4 MG PO TBDP: ORAL_TABLET | ORAL | Status: DC

## 2014-08-31 MED ORDER — ONDANSETRON HCL 4 MG/2ML IJ SOLN
4.0000 mg | Freq: Once | INTRAMUSCULAR | Status: AC
  Administered 2014-08-31: 4 mg via INTRAVENOUS
  Filled 2014-08-31: qty 2

## 2014-08-31 MED ORDER — DIPHENHYDRAMINE HCL 50 MG/ML IJ SOLN
25.0000 mg | Freq: Once | INTRAMUSCULAR | Status: AC
  Administered 2014-08-31: 25 mg via INTRAVENOUS
  Filled 2014-08-31: qty 1

## 2014-08-31 MED ORDER — METOCLOPRAMIDE HCL 5 MG/ML IJ SOLN
10.0000 mg | Freq: Once | INTRAMUSCULAR | Status: AC
  Administered 2014-08-31: 10 mg via INTRAVENOUS
  Filled 2014-08-31: qty 2

## 2014-08-31 MED ORDER — HYDROMORPHONE HCL 1 MG/ML IJ SOLN
1.0000 mg | Freq: Once | INTRAMUSCULAR | Status: AC
  Administered 2014-08-31: 1 mg via INTRAVENOUS
  Filled 2014-08-31: qty 1

## 2014-08-31 NOTE — ED Notes (Signed)
Requested urine specimen.  Pt states he is unable to void at present time. Urinal at bedside

## 2014-08-31 NOTE — Discharge Instructions (Signed)

## 2014-08-31 NOTE — ED Notes (Signed)
Pt reports " that pain medication i got did nothing for me " , MD made aware

## 2014-08-31 NOTE — ED Provider Notes (Signed)
CSN: [id number]     Arrival date & time 08/31/14  1142 History   First MD Initiated Contact with Patient 08/31/14 1152     Chief Complaint  Patient presents with  . Abdominal Pain     (Consider location/radiation/quality/duration/timing/severity/associated sxs/prior Treatment) Patient is a 54 y.o. male presenting with abdominal pain.  Abdominal Pain Pain location:  Generalized Pain quality: cramping   Pain radiates to:  Does not radiate Pain severity:  Moderate Onset quality:  Gradual Duration:  2 weeks Timing:  Constant Progression:  Unchanged Chronicity:  Recurrent Context: not alcohol use, not medication withdrawal and not previous surgeries   Relieved by:  Nothing Worsened by:  Nothing tried Ineffective treatments:  None tried Associated symptoms: anorexia, constipation, nausea and vomiting   Associated symptoms: no cough, no diarrhea, no dysuria and no fever     Past Medical History  Diagnosis Date  . Diabetes mellitus without complication   . IBS (irritable bowel syndrome)   . CAD (coronary artery disease)   . Hypertension   . GERD (gastroesophageal reflux disease)    History reviewed. No pertinent past surgical history. No family history on file. Social History  Substance Use Topics  . Smoking status: Current Every Day Smoker -- 0.50 packs/day    Types: Cigarettes  . Smokeless tobacco: None  . Alcohol Use: No    Review of Systems  Constitutional: Negative for fever.  Respiratory: Negative for cough.   Gastrointestinal: Positive for nausea, vomiting, abdominal pain, constipation and anorexia. Negative for diarrhea.  Genitourinary: Negative for dysuria.  All other systems reviewed and are negative.     Allergies  Review of patient's allergies indicates no known allergies.  Home Medications   Prior to Admission medications   Medication Sig Start Date End Date Taking? Authorizing Provider  dicyclomine (BENTYL) 10 MG capsule Take 10 mg by mouth 4  (four) times daily -  before meals and at bedtime.    Historical Provider, MD  HYDROcodone-acetaminophen (NORCO) 5-325 MG per tablet Take 1-2 tablets by mouth every 6 (six) hours as needed. 08/27/14   Geoffery Lyons, MD  ibuprofen (ADVIL,MOTRIN) 400 MG tablet Take 400 mg by mouth every 6 (six) hours as needed.    Historical Provider, MD  lisinopril (PRINIVIL,ZESTRIL) 10 MG tablet Take 10 mg by mouth daily.    Historical Provider, MD  metFORMIN (GLUCOPHAGE) 1000 MG tablet Take 1,000 mg by mouth 2 (two) times daily with a meal.    Historical Provider, MD  omeprazole (PRILOSEC) 10 MG capsule Take 10 mg by mouth daily.    Historical Provider, MD  ondansetron (ZOFRAN ODT) 4 MG disintegrating tablet  ODT q4 hours prn nausea/vomit 08/31/14   Mirian Mo, MD  rosuvastatin (CRESTOR) 10 MG tablet Take 10 mg by mouth daily.    Historical Provider, MD   BP 133/61 mmHg  Pulse 69  Temp(Src) 98.2 F (36.8 C) (Oral)  Resp 16  Ht  (1.676 m)  Wt 165 lb (74.844 kg)  BMI 26.64 kg/m2  SpO2 98% Physical Exam  Constitutional: He is oriented to person, place, and time. He appears well-developed and well-nourished.  HENT:  Head: Normocephalic and atraumatic.  Eyes: Conjunctivae and EOM are normal.  Neck: Normal range of motion. Neck supple.  Cardiovascular: Normal rate, regular rhythm and normal heart sounds.   Pulmonary/Chest: Effort normal and breath sounds normal. No respiratory distress.  Abdominal: He exhibits no distension. There is generalized tenderness. There is no rebound and no guarding.  Musculoskeletal:  Normal range of motion.  Neurological: He is alert and oriented to person, place, and time.  Skin: Skin is warm and dry.  Vitals reviewed.   ED Course  Procedures (including critical care time) Labs Review Labs Reviewed  COMPREHENSIVE METABOLIC PANEL - Abnormal; Notable for the following:    Glucose, Bld 173 (*)    ALT 14 (*)    All other components within normal limits  LIPASE,  BLOOD - Abnormal; Notable for the following:    Lipase 19 (*)    All other components within normal limits  CBC WITH DIFFERENTIAL/PLATELET - Abnormal; Notable for the following:    WBC 18.4 (*)    Platelets 401 (*)    Monocytes Relative 2 (*)    Neutro Abs 11.7 (*)    Lymphs Abs 6.3 (*)    All other components within normal limits  URINALYSIS, ROUTINE W REFLEX MICROSCOPIC (NOT AT [facility]) - Abnormal; Notable for the following:    Color, Urine AMBER (*)    APPearance CLOUDY (*)    Bilirubin Urine SMALL (*)    Ketones, ur 15 (*)    Protein, ur 30 (*)    All other components within normal limits  URINE RAPID DRUG SCREEN, HOSP PERFORMED - Abnormal; Notable for the following:    Opiates POSITIVE (*)    Tetrahydrocannabinol POSITIVE (*)    All other components within normal limits  URINE MICROSCOPIC-ADD ON - Abnormal; Notable for the following:    Casts HYALINE CASTS (*)    All other components within normal limits  I-STAT CG4 LACTIC ACID, ED - Abnormal; Notable for the following:    Lactic Acid, Venous 2.01 (*)    All other components within normal limits  TROPONIN I  ETHANOL    Imaging Review No results found.   EKG Interpretation   Date/Time:  Monday August 31 2014 12:08:07 EDT Ventricular Rate:  74 PR Interval:  116 QRS Duration: 86 QT Interval:  398 QTC Calculation: 441 R Axis:   -23 Text Interpretation:  Normal sinus rhythm Normal ECG No old tracing to  compare Confirmed by Mirian Mo [phone]) on 08/31/2014 1:27:14 PM      MDM   Final diagnoses:  Abdominal pain, acute    54 y.o. male with pertinent PMH of chronic abd pain (8 episodes last year) presents with recurrent abd pain.  Pt states he has had significant wu including endoscopy and colonoscopy and visits to gastroenterology which she states were unremarkable. He was seen recently for similar symptoms and had a negative CT scan. He denies change in symptoms since that time. On arrival today the patient has  vital signs and physical exam as above. On my history the patient denies use of any drugs.  Workup returned as above with positive THC. I confronted the patient about this and informed him that this could be contributing to his symptoms.  Pain relieved with narcotics. Tolerating by mouth. Discharged home in stable condition  I have reviewed all laboratory and imaging studies if ordered as above  1. Abdominal pain, acute         Mirian Mo, MD 09/02/14 1526

## 2014-08-31 NOTE — ED Notes (Signed)
Pt actively heaving/vomiting in triage-c/o cont'd abd pain-seen here 8/4 for same

## 2014-08-31 NOTE — ED Notes (Signed)
Urinal provided.

## 2014-08-31 NOTE — ED Notes (Signed)
MD at bedside. 

## 2014-12-15 ENCOUNTER — Emergency Department (HOSPITAL_BASED_OUTPATIENT_CLINIC_OR_DEPARTMENT_OTHER)
Admission: EM | Admit: 2014-12-15 | Discharge: 2014-12-15 | Disposition: A | Discharge status: Home / Self Care | Payer: Self-pay | Attending: Emergency Medicine | Admitting: Emergency Medicine

## 2014-12-15 ENCOUNTER — Other Ambulatory Visit: Payer: Self-pay | Admitting: Emergency Medicine

## 2014-12-15 ENCOUNTER — Encounter (HOSPITAL_BASED_OUTPATIENT_CLINIC_OR_DEPARTMENT_OTHER): Payer: Self-pay | Admitting: *Deleted

## 2014-12-15 ENCOUNTER — Emergency Department (HOSPITAL_BASED_OUTPATIENT_CLINIC_OR_DEPARTMENT_OTHER): Payer: Self-pay

## 2014-12-15 DIAGNOSIS — K859 Acute pancreatitis without necrosis or infection, unspecified: Secondary | ICD-10-CM | POA: Insufficient documentation

## 2014-12-15 DIAGNOSIS — F17210 Nicotine dependence, cigarettes, uncomplicated: Secondary | ICD-10-CM | POA: Insufficient documentation

## 2014-12-15 DIAGNOSIS — E119 Type 2 diabetes mellitus without complications: Secondary | ICD-10-CM | POA: Insufficient documentation

## 2014-12-15 DIAGNOSIS — I10 Essential (primary) hypertension: Secondary | ICD-10-CM | POA: Insufficient documentation

## 2014-12-15 DIAGNOSIS — Z79899 Other long term (current) drug therapy: Secondary | ICD-10-CM | POA: Insufficient documentation

## 2014-12-15 DIAGNOSIS — I2510 Atherosclerotic heart disease of native coronary artery without angina pectoris: Secondary | ICD-10-CM | POA: Insufficient documentation

## 2014-12-15 DIAGNOSIS — K589 Irritable bowel syndrome without diarrhea: Secondary | ICD-10-CM | POA: Insufficient documentation

## 2014-12-15 DIAGNOSIS — K219 Gastro-esophageal reflux disease without esophagitis: Secondary | ICD-10-CM | POA: Insufficient documentation

## 2014-12-15 LAB — CBC WITH DIFFERENTIAL/PLATELET
Basophils Absolute: 0 10*3/uL (ref 0.0–0.1)
Basophils Relative: 0 %
Eosinophils Absolute: 0.5 10*3/uL (ref 0.0–0.7)
Eosinophils Relative: 3 %
HEMATOCRIT: 40.2 % (ref 39.0–52.0)
HEMOGLOBIN: 13.5 g/dL (ref 13.0–17.0)
Lymphocytes Relative: 60 %
Lymphs Abs: 10.1 10*3/uL — ABNORMAL HIGH (ref 0.7–4.0)
MCH: 28 pg (ref 26.0–34.0)
MCHC: 33.6 g/dL (ref 30.0–36.0)
MCV: 83.4 fL (ref 78.0–100.0)
MONOS PCT: 4 %
Monocytes Absolute: 0.7 10*3/uL (ref 0.1–1.0)
NEUTROS PCT: 33 %
Neutro Abs: 5.6 10*3/uL (ref 1.7–7.7)
Platelets: 307 10*3/uL (ref 150–400)
RBC: 4.82 MIL/uL (ref 4.22–5.81)
RDW: 16.2 % — ABNORMAL HIGH (ref 11.5–15.5)
WBC: 16.9 10*3/uL — AB (ref 4.0–10.5)

## 2014-12-15 LAB — COMPREHENSIVE METABOLIC PANEL
ALT: 13 U/L — AB (ref 17–63)
AST: 16 U/L (ref 15–41)
Albumin: 4.5 g/dL (ref 3.5–5.0)
Alkaline Phosphatase: 50 U/L (ref 38–126)
Anion gap: 8 (ref 5–15)
BUN: 9 mg/dL (ref 6–20)
CHLORIDE: 105 mmol/L (ref 101–111)
CO2: 26 mmol/L (ref 22–32)
CREATININE: 0.91 mg/dL (ref 0.61–1.24)
Calcium: 9 mg/dL (ref 8.9–10.3)
Glucose, Bld: 81 mg/dL (ref 65–99)
Potassium: 4 mmol/L (ref 3.5–5.1)
SODIUM: 139 mmol/L (ref 135–145)
Total Bilirubin: 0.2 mg/dL — ABNORMAL LOW (ref 0.3–1.2)
Total Protein: 7.3 g/dL (ref 6.5–8.1)

## 2014-12-15 LAB — LIPASE, BLOOD: Lipase: 348 U/L — ABNORMAL HIGH (ref 11–51)

## 2014-12-15 MED ORDER — KETOROLAC TROMETHAMINE 15 MG/ML IJ SOLN
15.0000 mg | Freq: Once | INTRAMUSCULAR | Status: AC
  Administered 2014-12-15: 15 mg via INTRAVENOUS
  Filled 2014-12-15: qty 1

## 2014-12-15 MED ORDER — SODIUM CHLORIDE 0.9 % IV BOLUS (SEPSIS)
1000.0000 mL | Freq: Once | INTRAVENOUS | Status: AC
  Administered 2014-12-15: 1000 mL via INTRAVENOUS

## 2014-12-15 MED ORDER — PROMETHAZINE HCL 25 MG PO TABS: 25.0000 mg | ORAL_TABLET | Freq: Four times a day (QID) | ORAL | Status: DC | PRN

## 2014-12-15 MED ORDER — HYDROCODONE-ACETAMINOPHEN 5-325 MG PO TABS: 1.0000 | ORAL_TABLET | Freq: Four times a day (QID) | ORAL | Status: DC | PRN

## 2014-12-15 MED ORDER — HYDROMORPHONE HCL 1 MG/ML IJ SOLN
1.0000 mg | Freq: Once | INTRAMUSCULAR | Status: AC
  Administered 2014-12-15: 1 mg via INTRAVENOUS
  Filled 2014-12-15: qty 1

## 2014-12-15 MED ORDER — ONDANSETRON HCL 4 MG/2ML IJ SOLN
4.0000 mg | Freq: Once | INTRAMUSCULAR | Status: AC
  Administered 2014-12-15: 4 mg via INTRAVENOUS
  Filled 2014-12-15: qty 2

## 2014-12-15 NOTE — ED Notes (Signed)
Ice chips and water given for PO challenge

## 2014-12-15 NOTE — ED Notes (Signed)
C/o abd pain in left side. Pt constipated x 2 days. Pain started 3 weeks ago. Pt has hx of IBS. Pt states hx of clogged arteries in left abd.

## 2014-12-15 NOTE — Discharge Instructions (Signed)

## 2014-12-15 NOTE — ED Provider Notes (Signed)
CSN: [id number]     Arrival date & time 12/15/14  1713 History   First MD Initiated Contact with Patient 12/15/14 1731     No chief complaint on file.    The history is provided by the patient. No language interpreter was used.   Jacobi Nile is a 54 y.o. male who presents to the Emergency Department complaining of abdominal pain. He has a history of hypertension, diabetes, irritable bowel syndrome. Reports 3 weeks of intermittent left lower quadrant abdominal pain. The pain is described as sharp in nature and is waxing and waning. No clear alleviating or worsening factors but does note that it is worse with urination at times. He denies any fevers. He does have associated nausea and vomiting the last several days. He is having increased constipation. No change in his urinary output, no dysuria. Symptoms are moderate and worsening.   Past Medical History  Diagnosis Date  . Diabetes mellitus without complication (HCC)   . IBS (irritable bowel syndrome)   . CAD (coronary artery disease)   . Hypertension   . GERD (gastroesophageal reflux disease)    History reviewed. No pertinent past surgical history. No family history on file. Social History  Substance Use Topics  . Smoking status: Current Every Day Smoker -- 0.50 packs/day    Types: Cigarettes  . Smokeless tobacco: None  . Alcohol Use: No    Review of Systems  All other systems reviewed and are negative.     Allergies  Review of patient's allergies indicates no known allergies.  Home Medications   Prior to Admission medications   Medication Sig Start Date End Date Taking? Authorizing Provider  dicyclomine (BENTYL) 10 MG capsule Take 10 mg by mouth 4 (four) times daily -  before meals and at bedtime.   Yes Historical Provider, MD  ibuprofen (ADVIL,MOTRIN) 400 MG tablet Take 400 mg by mouth every 6 (six) hours as needed.   Yes Historical Provider, MD  lisinopril (PRINIVIL,ZESTRIL) 10 MG tablet Take 10 mg by mouth daily.    Yes Historical Provider, MD  metFORMIN (GLUCOPHAGE) 1000 MG tablet Take 1,000 mg by mouth 2 (two) times daily with a meal.   Yes Historical Provider, MD  omeprazole (PRILOSEC) 10 MG capsule Take 10 mg by mouth daily.   Yes Historical Provider, MD  rosuvastatin (CRESTOR) 10 MG tablet Take 10 mg by mouth daily.   Yes Historical Provider, MD  sertraline (ZOLOFT) 100 MG tablet Take 100 mg by mouth daily.   Yes Historical Provider, MD   BP 132/80 mmHg  Pulse 76  Temp(Src) 98.2 F (36.8 C) (Oral)  Resp 18  Ht  (1.676 m)  Wt 162 lb (73.483 kg)  BMI 26.16 kg/m2  SpO2 100% Physical Exam  Constitutional: He is oriented to person, place, and time. He appears well-developed and well-nourished.  HENT:  Head: Normocephalic and atraumatic.  Cardiovascular: Normal rate and regular rhythm.   No murmur heard. Pulmonary/Chest: Effort normal and breath sounds normal. No respiratory distress.  Abdominal: Soft. There is no rebound and no guarding.  Mild diffuse abdominal tenderness  Genitourinary:  No inguinal masses  Musculoskeletal: Normal range of motion. He exhibits no edema or tenderness.  2+ femoral pulses bilaterally  Neurological: He is alert and oriented to person, place, and time.  Skin: Skin is warm and dry.  Psychiatric: He has a normal mood and affect. His behavior is normal.  Nursing note and vitals reviewed.   ED Course  Procedures (including critical  care time) Labs Review Labs Reviewed  COMPREHENSIVE METABOLIC PANEL - Abnormal; Notable for the following:    ALT 13 (*)    Total Bilirubin 0.2 (*)    All other components within normal limits  CBC WITH DIFFERENTIAL/PLATELET - Abnormal; Notable for the following:    WBC 16.9 (*)    RDW 16.2 (*)    Lymphs Abs 10.1 (*)    All other components within normal limits  LIPASE, BLOOD - Abnormal; Notable for the following:    Lipase 348 (*)    All other components within normal limits    Imaging Review Ct Renal Stone  Study  12/15/2014  CLINICAL DATA:  Pt getting meds Pt co left flank pain x 3 wks, nausea, vomiting, h/o kidney stones EXAM: CT ABDOMEN AND PELVIS WITHOUT CONTRAST TECHNIQUE: Multidetector CT imaging of the abdomen and pelvis was performed following the standard protocol without IV contrast. COMPARISON:  08/27/2014 FINDINGS: Visualized lung bases clear. Unremarkable liver, nondilated gallbladder, spleen, adrenal glands, pancreas, kidneys. Unenhanced CT was performed per clinician order. Lack of IV contrast limits sensitivity and specificity, especially for evaluation of abdominal/pelvic solid viscera. No nephrolithiasis or hydronephrosis. Stomach is nondistended. Small bowel and colon are nondilated. Normal appendix. Urinary bladder physiologically distended. Mild prostatic enlargement. No ascites. No free air. No adenopathy. Patchy aortoiliac arterial calcifications. IMPRESSION: 1. No nephrolithiasis, hydronephrosis, or other acute intra-abdominal process. Electronically Signed   By: Corlis Leak  Hassell M.D.   On: 12/15/2014 19:06   I have personally reviewed and evaluated these images and lab results as part of my medical decision-making.   EKG Interpretation None      MDM   Final diagnoses:  Acute pancreatitis, unspecified complication status, unspecified pancreatitis type    Patient here for evaluation of abdominal pain, vomiting. Initial concern for renal colic given his symptoms. CT negative for stone. Labs are consistent with acute pancreatitis. On repeat evaluation following IV fluids and pain medications as pain is resolved and he is tolerating oral fluids without any difficulty. Discussed with patient options for admission for monitoring versus outpatient follow-up and patient prefers outpatient follow-up. Discussed very close return precautions for new or worsening symptoms.    Tilden FossaElizabeth Meagan Ancona, MD 12/15/14 2351

## 2014-12-19 ENCOUNTER — Emergency Department (HOSPITAL_BASED_OUTPATIENT_CLINIC_OR_DEPARTMENT_OTHER)
Admission: EM | Admit: 2014-12-19 | Discharge: 2014-12-20 | Disposition: A | Discharge status: Home / Self Care | Payer: Self-pay | Attending: Emergency Medicine | Admitting: Emergency Medicine

## 2014-12-19 ENCOUNTER — Emergency Department (HOSPITAL_BASED_OUTPATIENT_CLINIC_OR_DEPARTMENT_OTHER): Payer: Self-pay

## 2014-12-19 ENCOUNTER — Encounter (HOSPITAL_BASED_OUTPATIENT_CLINIC_OR_DEPARTMENT_OTHER): Payer: Self-pay | Admitting: Adult Health

## 2014-12-19 DIAGNOSIS — Z7984 Long term (current) use of oral hypoglycemic drugs: Secondary | ICD-10-CM | POA: Insufficient documentation

## 2014-12-19 DIAGNOSIS — I10 Essential (primary) hypertension: Secondary | ICD-10-CM | POA: Insufficient documentation

## 2014-12-19 DIAGNOSIS — I2510 Atherosclerotic heart disease of native coronary artery without angina pectoris: Secondary | ICD-10-CM | POA: Insufficient documentation

## 2014-12-19 DIAGNOSIS — Z79899 Other long term (current) drug therapy: Secondary | ICD-10-CM | POA: Insufficient documentation

## 2014-12-19 DIAGNOSIS — E119 Type 2 diabetes mellitus without complications: Secondary | ICD-10-CM | POA: Insufficient documentation

## 2014-12-19 DIAGNOSIS — R109 Unspecified abdominal pain: Secondary | ICD-10-CM

## 2014-12-19 DIAGNOSIS — K589 Irritable bowel syndrome without diarrhea: Secondary | ICD-10-CM | POA: Insufficient documentation

## 2014-12-19 DIAGNOSIS — F17210 Nicotine dependence, cigarettes, uncomplicated: Secondary | ICD-10-CM | POA: Insufficient documentation

## 2014-12-19 DIAGNOSIS — K219 Gastro-esophageal reflux disease without esophagitis: Secondary | ICD-10-CM | POA: Insufficient documentation

## 2014-12-19 DIAGNOSIS — M545 Low back pain: Secondary | ICD-10-CM | POA: Insufficient documentation

## 2014-12-19 DIAGNOSIS — R300 Dysuria: Secondary | ICD-10-CM | POA: Insufficient documentation

## 2014-12-19 DIAGNOSIS — R112 Nausea with vomiting, unspecified: Secondary | ICD-10-CM | POA: Insufficient documentation

## 2014-12-19 DIAGNOSIS — R1084 Generalized abdominal pain: Secondary | ICD-10-CM | POA: Insufficient documentation

## 2014-12-19 LAB — URINALYSIS, ROUTINE W REFLEX MICROSCOPIC
GLUCOSE, UA: NEGATIVE mg/dL
HGB URINE DIPSTICK: NEGATIVE
Ketones, ur: 15 mg/dL — AB
Leukocytes, UA: NEGATIVE
Nitrite: NEGATIVE
Protein, ur: 30 mg/dL — AB
SPECIFIC GRAVITY, URINE: 1.028 (ref 1.005–1.030)
pH: 6.5 (ref 5.0–8.0)

## 2014-12-19 LAB — URINE MICROSCOPIC-ADD ON: Bacteria, UA: NONE SEEN

## 2014-12-19 LAB — CBC
HEMATOCRIT: 40.8 % (ref 39.0–52.0)
Hemoglobin: 13.7 g/dL (ref 13.0–17.0)
MCH: 28.2 pg (ref 26.0–34.0)
MCHC: 33.6 g/dL (ref 30.0–36.0)
MCV: 84 fL (ref 78.0–100.0)
Platelets: 324 10*3/uL (ref 150–400)
RBC: 4.86 MIL/uL (ref 4.22–5.81)
RDW: 16.4 % — ABNORMAL HIGH (ref 11.5–15.5)
WBC: 18.9 10*3/uL — ABNORMAL HIGH (ref 4.0–10.5)

## 2014-12-19 LAB — COMPREHENSIVE METABOLIC PANEL
ALBUMIN: 4.7 g/dL (ref 3.5–5.0)
ALK PHOS: 57 U/L (ref 38–126)
ALT: 14 U/L — ABNORMAL LOW (ref 17–63)
ANION GAP: 9 (ref 5–15)
AST: 16 U/L (ref 15–41)
BUN: 14 mg/dL (ref 6–20)
CALCIUM: 9.4 mg/dL (ref 8.9–10.3)
CO2: 26 mmol/L (ref 22–32)
Chloride: 107 mmol/L (ref 101–111)
Creatinine, Ser: 0.84 mg/dL (ref 0.61–1.24)
GFR calc Af Amer: >60 mL/min (ref >60)
GFR calc non Af Amer: >60 mL/min (ref >60)
GLUCOSE: 182 mg/dL — AB (ref 65–99)
POTASSIUM: 4.4 mmol/L (ref 3.5–5.1)
SODIUM: 142 mmol/L (ref 135–145)
Total Bilirubin: 0.4 mg/dL (ref 0.3–1.2)
Total Protein: 7.7 g/dL (ref 6.5–8.1)

## 2014-12-19 LAB — RAPID URINE DRUG SCREEN, HOSP PERFORMED
AMPHETAMINES: NOT DETECTED
Barbiturates: NOT DETECTED
Benzodiazepines: NOT DETECTED
Cocaine: NOT DETECTED
Opiates: POSITIVE — AB
TETRAHYDROCANNABINOL: POSITIVE — AB

## 2014-12-19 LAB — LIPASE, BLOOD: Lipase: 32 U/L (ref 11–51)

## 2014-12-19 LAB — ETHANOL: Alcohol, Ethyl (B): <5 mg/dL (ref <5)

## 2014-12-19 MED ORDER — ONDANSETRON 4 MG PO TBDP: ORAL_TABLET | ORAL | Status: DC

## 2014-12-19 MED ORDER — IOHEXOL 300 MG/ML  SOLN: 30.0000 mL | Freq: Once | INTRAMUSCULAR | Status: DC | PRN

## 2014-12-19 MED ORDER — IOHEXOL 300 MG/ML  SOLN
30.0000 mL | Freq: Once | INTRAMUSCULAR | Status: DC | PRN
  Administered 2014-12-19: 30 mL via INTRAVENOUS

## 2014-12-19 MED ORDER — HYDROMORPHONE HCL 1 MG/ML IJ SOLN
1.0000 mg | Freq: Once | INTRAMUSCULAR | Status: AC
  Administered 2014-12-19: 1 mg via INTRAVENOUS
  Filled 2014-12-19: qty 1

## 2014-12-19 MED ORDER — ONDANSETRON HCL 4 MG/2ML IJ SOLN
4.0000 mg | Freq: Once | INTRAMUSCULAR | Status: AC
  Administered 2014-12-19: 4 mg via INTRAVENOUS

## 2014-12-19 MED ORDER — SODIUM CHLORIDE 0.9 % IV BOLUS (SEPSIS)
1000.0000 mL | Freq: Once | INTRAVENOUS | Status: AC
  Administered 2014-12-19: 1000 mL via INTRAVENOUS

## 2014-12-19 MED ORDER — IOHEXOL 300 MG/ML  SOLN
50.0000 mL | Freq: Once | INTRAMUSCULAR | Status: AC | PRN
Start: 2014-12-19 — End: 2014-12-19
  Administered 2014-12-19: 50 mL via ORAL

## 2014-12-19 MED ORDER — ONDANSETRON HCL 4 MG/2ML IJ SOLN
INTRAMUSCULAR | Status: AC
  Filled 2014-12-19: qty 2

## 2014-12-19 MED ORDER — OXYCODONE-ACETAMINOPHEN 5-325 MG PO TABS: 1.0000 | ORAL_TABLET | ORAL | Status: DC | PRN

## 2014-12-19 NOTE — ED Notes (Addendum)
Presents with 3 weeks of left flank and middle back pain, states this feels like pancreatitis, it has been ongoing for three weeks. The pain is lower in the abdomen as well mostly on left side. Reports 6 episodes of vomiting, endorses nausea. Pt is diaphoretic, pain rated 10/10. Denies diarrhea.  Endorses episode of left sided chest pain yesterday and SOB at that time. Denies chest pain at this time

## 2014-12-19 NOTE — ED Provider Notes (Signed)
CSN: [id number]     Arrival date & time 12/19/14  1913 History  By signing my name below, I, David Nelson, attest that this documentation has been prepared under the direction and in the presence of David MoMatthew Gentry, MD. Electronically Signed: [name], ED Scribe. 12/19/2014. 7:51 PM.     Chief Complaint  Patient presents with  . Abdominal Pain   Patient is a 54 y.o. male presenting with abdominal pain. The history is provided by the patient. No language interpreter was used.  Abdominal Pain Pain location:  LLQ and LUQ Pain quality: aching   Pain severity:  Moderate Onset quality:  Gradual Duration:  3 weeks Timing:  Constant Progression:  Worsening Chronicity:  Recurrent Context: retching   Relieved by:  Nothing Associated symptoms: dysuria, nausea and vomiting    HPI Comments: David Nelson is a 54 y.o. male smoker at 0.5 ppd with a PMHx of IBS, GERD, CAD, DM, and HTN who presents to the Emergency Department complaining of constant, aching, worsening, left-sided, lower abdominal pain onset 3 weeks ago. He reports associated n/v, lower back pain ("as though someone had punched me"), and dysuria. He notes exacerbation of the pain after urinating. Pt states he has been seen for the same before, when he was diagnosed with Pancreatitis. He notes a PMHx of the same and states he has seen a GI specialist for this. He states he was not told what had caused that episode. He notes he has not been drinking alcohol for the past 5 years. He notes he smokes marijuana, and states his last use was yesterday. He denies a PSHx of cholecystectomy.   Past Medical History  Diagnosis Date  . Diabetes mellitus without complication (HCC)   . IBS (irritable bowel syndrome)   . CAD (coronary artery disease)   . Hypertension   . GERD (gastroesophageal reflux disease)    History reviewed. No pertinent past surgical history. History reviewed. No pertinent family history. Social History  Substance  Use Topics  . Smoking status: Current Every Day Smoker -- 0.50 packs/day    Types: Cigarettes  . Smokeless tobacco: None  . Alcohol Use: No    Review of Systems  Gastrointestinal: Positive for nausea, vomiting and abdominal pain.  Genitourinary: Positive for dysuria.  Musculoskeletal: Positive for back pain.  All other systems reviewed and are negative.   Allergies  Review of patient's allergies indicates no known allergies.  Home Medications   Prior to Admission medications   Medication Sig Start Date End Date Taking? Authorizing Provider  dicyclomine (BENTYL) 10 MG capsule Take 10 mg by mouth 4 (four) times daily -  before meals and at bedtime.    Historical Provider, MD  HYDROcodone-acetaminophen (NORCO/VICODIN) 5-325 MG tablet Take 1 tablet by mouth every 6 (six) hours as needed. 12/15/14   Tilden FossaElizabeth Rees, MD  ibuprofen (ADVIL,MOTRIN) 400 MG tablet Take 400 mg by mouth every 6 (six) hours as needed.    Historical Provider, MD  lisinopril (PRINIVIL,ZESTRIL) 10 MG tablet Take 10 mg by mouth daily.    Historical Provider, MD  metFORMIN (GLUCOPHAGE) 1000 MG tablet Take 1,000 mg by mouth 2 (two) times daily with a meal.    Historical Provider, MD  omeprazole (PRILOSEC) 10 MG capsule Take 10 mg by mouth daily.    Historical Provider, MD  ondansetron (ZOFRAN ODT) 4 MG disintegrating tablet 4mg  ODT q4 hours prn nausea/vomit 12/19/14   David MoMatthew Gentry, MD  oxyCODONE-acetaminophen (PERCOCET/ROXICET) 5-325 MG tablet Take 1-2 tablets by mouth  every 4 (four) hours as needed. 12/19/14   David Mo, MD  promethazine (PHENERGAN) 25 MG tablet Take 1 tablet (25 mg total) by mouth every 6 (six) hours as needed for nausea or vomiting. 12/15/14   Tilden Fossa, MD  rosuvastatin (CRESTOR) 10 MG tablet Take 10 mg by mouth daily.    Historical Provider, MD  sertraline (ZOLOFT) 100 MG tablet Take 100 mg by mouth daily.    Historical Provider, MD   BP 139/80 mmHg  Pulse 75  Temp(Src) 98.6 F (37 C)  (Oral)  Resp 14  Ht  (1.676 m)  Wt 165 lb (74.844 kg)  BMI 26.64 kg/m2  SpO2 100% Physical Exam  Constitutional: He is oriented to person, place, and time. He appears well-developed and well-nourished.  HENT:  Head: Normocephalic and atraumatic.  Eyes: Conjunctivae and EOM are normal.  Neck: Normal range of motion. Neck supple.  Cardiovascular: Normal rate, regular rhythm and normal heart sounds.   Pulmonary/Chest: Effort normal and breath sounds normal. No respiratory distress.  Abdominal: He exhibits no distension. There is generalized tenderness. There is no rebound and no guarding.  Musculoskeletal: Normal range of motion.  Neurological: He is alert and oriented to person, place, and time.  Skin: Skin is warm and dry.  Vitals reviewed.   ED Course  Procedures  DIAGNOSTIC STUDIES: Oxygen Saturation is 99% on RA, normal by my interpretation.    COORDINATION OF CARE: 7:32 PM - Discussed plans to order pain and anti-nausea medication, as well as diagnostic studies. Advised pt stop smoking marijuana. Pt advised of plan for treatment and pt agrees.  Labs Review Labs Reviewed  COMPREHENSIVE METABOLIC PANEL - Abnormal; Notable for the following:    Glucose, Bld 182 (*)    ALT 14 (*)    All other components within normal limits  CBC - Abnormal; Notable for the following:    WBC 18.9 (*)    RDW 16.4 (*)    All other components within normal limits  URINALYSIS, ROUTINE W REFLEX MICROSCOPIC (NOT AT [facility]) - Abnormal; Notable for the following:    Bilirubin Urine SMALL (*)    Ketones, ur 15 (*)    Protein, ur 30 (*)    All other components within normal limits  URINE RAPID DRUG SCREEN, HOSP PERFORMED - Abnormal; Notable for the following:    Opiates POSITIVE (*)    Tetrahydrocannabinol POSITIVE (*)    All other components within normal limits  URINE MICROSCOPIC-ADD ON - Abnormal; Notable for the following:    Squamous Epithelial / LPF 0-5 (*)    Casts GRANULAR CAST (*)     All other components within normal limits  LIPASE, BLOOD  ETHANOL    Imaging Review US Abdomen Complete  12/19/2014  CLINICAL DATA:  Nausea and vomiting for 2 weeks. Left lower quadrant abdominal pain for 3 days. Pancreatitis for 3 days. EXAM: ULTRASOUND ABDOMEN COMPLETE COMPARISON:  CT abdomen and pelvis 12/15/2014 FINDINGS: Gallbladder: No gallstones or wall thickening visualized. No sonographic Murphy sign noted. Common bile duct: Diameter: 2.5 mm, normal Liver: No focal lesion identified. Within normal limits in parenchymal echogenicity. IVC: No abnormality visualized. Pancreas: Incomplete visualization due to overlying bowel gas. The pancreatic duct as visualized in the body of the pancreas is mildly dilated at 4 mm. This may indicate changes of obstruction or pancreatitis. Spleen: Size and appearance within normal limits. Right Kidney: Length: 12.5 cm. Echogenicity within normal limits. No mass or hydronephrosis visualized. Left Kidney: Length: 11.3 cm.  Echogenicity within normal limits. No mass or hydronephrosis visualized. Abdominal aorta: No aneurysm visualized. Other findings: None. IMPRESSION: No evidence of cholelithiasis or cholecystitis. Segmental visualization of the pancreas demonstrates mild pancreatic ductal dilatation in the body region. Electronically Signed   By: Burman Nieves M.D.   On: 12/19/2014 20:58   Ct Abdomen Pelvis W Contrast  12/19/2014  CLINICAL DATA:  Subacute onset of left lower quadrant abdominal pain, with nausea and vomiting. Lower back pain. Dysuria. Initial encounter. EXAM: CT ABDOMEN AND PELVIS WITH CONTRAST TECHNIQUE: Multidetector CT imaging of the abdomen and pelvis was performed using the standard protocol following bolus administration of intravenous contrast. CONTRAST:  50mL OMNIPAQUE IOHEXOL 300 MG/ML  SOLN COMPARISON:  CT of the abdomen and pelvis performed 12/15/2014, and abdominal ultrasound performed earlier today at 8:23 p.m. FINDINGS: The  visualized lung bases are clear. The liver and spleen are unremarkable in appearance. The gallbladder is within normal limits. The pancreas and adrenal glands are unremarkable. The pancreatic duct prominence suggested on ultrasound is not seen on CT. The kidneys are unremarkable in appearance. There is no evidence of hydronephrosis. No renal or ureteral stones are seen. Minimal nonspecific perinephric stranding is noted bilaterally. No free fluid is identified. The small bowel is unremarkable in appearance. The stomach is within normal limits. No acute vascular abnormalities are seen. Scattered calcification is noted along the abdominal aorta and its branches. The appendix is normal in caliber and contains trace air, without evidence of appendicitis. The colon is unremarkable in appearance. The bladder is mildly distended and grossly unremarkable. The prostate remains normal in size. No inguinal lymphadenopathy is seen. No acute osseous abnormalities are identified. IMPRESSION: 1. No acute abnormality seen within the abdomen or pelvis. 2. Scattered calcification along the abdominal aorta and its branches. Electronically Signed   By: Roanna Raider M.D.   On: 12/19/2014 23:36   I have personally reviewed and evaluated these images and lab results as part of my medical decision-making.   EKG Interpretation   Date/Time:  Saturday December 19 2014 19:24:14 EST Ventricular Rate:  66 PR Interval:  142 QRS Duration: 88 QT Interval:  424 QTC Calculation: 444 R Axis:   3 Text Interpretation:  Normal sinus rhythm Normal ECG No significant change  since last tracing Confirmed by David Mo [phone]) on 12/19/2014  7:32:24 PM      MDM   Final diagnoses:  Recurrent abdominal pain    54 y.o. male with pertinent PMH of pancreatitis with recent visit for same presents with recurrent abd pain.  Seen recently with elevated lipase.  Returned home and symptoms continued, no acute worsening.  Presents due to  continued pain.  On arrival vitals and physical exam as above.  Given dilaudid, wu as above unremarkable.  Likely recurrent chronic abd pain, dc home to fu with GI. Encouraged cessation of marijuana.    I have reviewed all laboratory and imaging studies if ordered as above  1. Recurrent abdominal pain   2. Pain in the abdomen           David Mo, MD 12/20/14 [phone]

## 2014-12-19 NOTE — Discharge Instructions (Signed)

## 2014-12-19 NOTE — ED Notes (Signed)
Pt placed on heart monitor.

## 2014-12-23 ENCOUNTER — Ambulatory Visit (INDEPENDENT_AMBULATORY_CARE_PROVIDER_SITE_OTHER): Payer: Self-pay | Admitting: Gastroenterology

## 2014-12-23 ENCOUNTER — Encounter: Payer: Self-pay | Admitting: Gastroenterology

## 2014-12-23 VITALS — BP 142/70 | HR 72 | Ht 66.0 in | Wt 180.0 lb

## 2014-12-23 DIAGNOSIS — R1033 Periumbilical pain: Secondary | ICD-10-CM

## 2014-12-23 MED ORDER — NA SULFATE-K SULFATE-MG SULF 17.5-3.13-1.6 GM/177ML PO SOLN: ORAL | Status: DC

## 2014-12-23 NOTE — Progress Notes (Signed)
HPI: This is a  pleasant 54 year old man     who was referred to me by ER MD to evaluate  abdominal pains .    Chief complaint is chronic intermittent abdominal pains  Had had lower abdominal pains for about a month.  Feels like really sharp pains, + nausea, vomiting. Also pains in the middle of back. Also pains in his groin bilaterally.  The pain is intermittent.   He says he has been dealing with this for about 10 years  Starts after he urinates or moves his bowels.  He had colonoscopy in [place] for these same pains about 4 years.  Was in hosp for 8 days.  He tells me the colonoscopy was normal. He had upper endoscopy, he tells me this was normal as well.  He believes he was told he had IBS.  Overall gaining weight.  Probably 10 pounds in past 6 months.  CT scan abdomen and pelvis with IV and oral contrast November 2016 done during ER admission for abdominal pain was essentially normal. No pancreatitis noted  He saw a GI doctor in [place] in 2014 and other tests were done.  He says he's been to [place] [facility] for abd pains 6-8 times in 2015.  CT scan abdomen pelvis with IV and oral contrast August 2016 done during ER admission for abdominal pain was also essentially normal Ultrasound abdomen November 2016 suggested dilated main pancreatic duct but was otherwise normal.  Bloodwork November and August 2016 showed normal CBC, normal complete about profile, his amylase was 300 on one occasion, most recently his amylase was normal.  He takes 3 of the 600mg  ibuprofen daily for past 2-3 months. Says this doesn't help at all.   Review of systems: Pertinent positive and negative review of systems were noted in the above HPI section. Complete review of systems was performed and was otherwise normal.   Past Medical History  Diagnosis Date  . Diabetes mellitus without complication (HCC)   . IBS (irritable bowel syndrome)   . CAD (coronary artery disease)   . Hypertension   . GERD  (gastroesophageal reflux disease)     History reviewed. No pertinent past surgical history.  Current Outpatient Prescriptions  Medication Sig Dispense Refill  . dicyclomine (BENTYL) 10 MG capsule Take 10 mg by mouth 4 (four) times daily -  before meals and at bedtime.    Marland Kitchen. HYDROcodone-acetaminophen (NORCO/VICODIN) 5-325 MG tablet Take 1 tablet by mouth every 6 (six) hours as needed. 6 tablet 0  . ibuprofen (ADVIL,MOTRIN) 400 MG tablet Take 400 mg by mouth every 6 (six) hours as needed.    Marland Kitchen. lisinopril (PRINIVIL,ZESTRIL) 10 MG tablet Take 10 mg by mouth daily.    . metFORMIN (GLUCOPHAGE) 1000 MG tablet Take 1,000 mg by mouth 2 (two) times daily with a meal.    . omeprazole (PRILOSEC) 10 MG capsule Take 10 mg by mouth daily.    . ondansetron (ZOFRAN ODT) 4 MG disintegrating tablet 4mg  ODT q4 hours prn nausea/vomit 15 tablet 0  . oxyCODONE-acetaminophen (PERCOCET/ROXICET) 5-325 MG tablet Take 1-2 tablets by mouth every 4 (four) hours as needed. 15 tablet 0  . promethazine (PHENERGAN) 25 MG tablet Take 1 tablet (25 mg total) by mouth every 6 (six) hours as needed for nausea or vomiting. 8 tablet 0  . rosuvastatin (CRESTOR) 10 MG tablet Take 10 mg by mouth daily.    . sertraline (ZOLOFT) 100 MG tablet Take 100 mg by mouth daily.  No current facility-administered medications for this visit.    Allergies as of 12/23/2014  . (No Known Allergies)    Family History  Problem Relation Age of Onset  . Family history unknown: Yes    Social History   Social History  . Marital Status: Significant Other    Spouse Name: N/A  . Number of Children: N/A  . Years of Education: N/A   Occupational History  . disabled    Social History Main Topics  . Smoking status: Current Every Day Smoker -- 0.50 packs/day    Types: Cigarettes  . Smokeless tobacco: Not on file  . Alcohol Use: No  . Drug Use: No  . Sexual Activity: Not on file   Other Topics Concern  . Not on file   Social History  Narrative     Physical Exam: BP 142/70 mmHg  Pulse 72  Ht  (1.676 m)  Wt 180 lb (81.647 kg)  BMI 29.07 kg/m2 Constitutional: generally well-appearing Psychiatric: alert and oriented x3 Eyes: extraocular movements intact Mouth: oral pharynx moist, no lesions Neck: supple no lymphadenopathy Cardiovascular: heart regular rate and rhythm Lungs: clear to auscultation bilaterally Abdomen: soft, nontender, nondistended, no obvious ascites, no peritoneal signs, normal bowel sounds Extremities: no lower extremity edema bilaterally Skin: no lesions on visible extremities   Assessment and plan: 54 y.o. male with  chronic intermittent abdominal pains  He tells me he has been dealing with these intermittent severe abdominal pains for about 10 years. He has undergone multiple tests including CAT scans, colonoscopy, upper endoscopy which were about 4 years ago in [facility]. He has seen other gastroenterologist for this. Most recently CT scan, ultrasound were essentially normal. Blood work shows normal complete metabolic profile. He did have slightly elevated white blood cell count which I cannot explain. I'm suspicious of drug-seeking behavior here. I explained to him that he is on a very high-dose of NSAIDs daily and this can cause ulcers and he should try to cut back if he can. He is currently listed as taking 2 different narcotic pain medicines, he received these from emergency room visits here recently. His tox screen was positive for opiates and for cannabis. I explained to him that we would not be prescribing narcotic pain medicines for his chronic intermittent pain. I offered further testing with colonoscopy and upper endoscopy, admitted that I would unlikely be able to find any explanation for his chronic intermittent pains since they've been going on for so long, he has undergone some many tests already and all have been negative, unrevealing. He would like to proceed with colonoscopy and  upper endoscopy. We will get records from his previous [place] [facility] visits. I also recommended he start taking MiraLAX on a daily basis since he is very bothered by chronic constipation as well area   Rob Bunting, MD [facility] 12/23/2014, 10:13 AM  Cc: ER MD

## 2014-12-23 NOTE — Patient Instructions (Addendum)
We will get records sent from your previous gastroenterologist ([place]) for review.  This will include any endoscopic (colonoscopy or upper endoscopy) procedures and any associated pathology reports.   We will not prescribe narcotic pain medicines. You will be set up for a colonoscopy, EGD (for abdominal pains). Take miralax every single day instead of just needed (will help your constipation).  You have been scheduled for an endoscopy and colonoscopy. Please follow the written instructions given to you at your visit today. Please pick up your prep supplies at the pharmacy within the next 1-3 days. If you use inhalers (even only as needed), please bring them with you on the day of your procedure. Your physician has requested that you go to www.startemmi.com and enter the access code given to you at your visit today. This web site gives a general overview about your procedure. However, you should still follow specific instructions given to you by our office regarding your preparation for the procedure.

## 2015-02-02 ENCOUNTER — Telehealth: Payer: Self-pay | Admitting: Gastroenterology

## 2015-02-02 NOTE — Telephone Encounter (Signed)
Colonoscopy January 2015 [place] [facility], done for "variety of GI complaints including diarrhea and nausea vomiting upper and lower GI pain recent CT scan raise possibility of colitis". Findings normal terminal ileum, sigmoid diverticulosis and small internal hemorrhoids. Biopsies showed normal terminal ileum, normal colonic mucosa. Upper endoscopy January 2015 with [facility]. [facility], done for the same. Findings gastric inlet patch just below the upper esophageal sphincter muscle slight sliding hiatal hernia patchy erythema in the duodenum. Biopsies were all essentially normal except for some mild inflammation.

## 2015-02-09 ENCOUNTER — Encounter: Payer: Self-pay | Admitting: Gastroenterology

## 2015-02-17 ENCOUNTER — Encounter (HOSPITAL_BASED_OUTPATIENT_CLINIC_OR_DEPARTMENT_OTHER): Payer: Self-pay

## 2015-02-17 ENCOUNTER — Emergency Department (HOSPITAL_BASED_OUTPATIENT_CLINIC_OR_DEPARTMENT_OTHER)
Admission: EM | Admit: 2015-02-17 | Discharge: 2015-02-17 | Disposition: A | Discharge status: Home / Self Care | Payer: Self-pay | Attending: Emergency Medicine | Admitting: Emergency Medicine

## 2015-02-17 DIAGNOSIS — F17210 Nicotine dependence, cigarettes, uncomplicated: Secondary | ICD-10-CM | POA: Insufficient documentation

## 2015-02-17 DIAGNOSIS — K5900 Constipation, unspecified: Secondary | ICD-10-CM | POA: Insufficient documentation

## 2015-02-17 DIAGNOSIS — I2510 Atherosclerotic heart disease of native coronary artery without angina pectoris: Secondary | ICD-10-CM | POA: Insufficient documentation

## 2015-02-17 DIAGNOSIS — K219 Gastro-esophageal reflux disease without esophagitis: Secondary | ICD-10-CM | POA: Insufficient documentation

## 2015-02-17 DIAGNOSIS — K859 Acute pancreatitis without necrosis or infection, unspecified: Secondary | ICD-10-CM | POA: Insufficient documentation

## 2015-02-17 DIAGNOSIS — I10 Essential (primary) hypertension: Secondary | ICD-10-CM | POA: Insufficient documentation

## 2015-02-17 DIAGNOSIS — Z79899 Other long term (current) drug therapy: Secondary | ICD-10-CM | POA: Insufficient documentation

## 2015-02-17 DIAGNOSIS — Z7984 Long term (current) use of oral hypoglycemic drugs: Secondary | ICD-10-CM | POA: Insufficient documentation

## 2015-02-17 DIAGNOSIS — E119 Type 2 diabetes mellitus without complications: Secondary | ICD-10-CM | POA: Insufficient documentation

## 2015-02-17 DIAGNOSIS — N410 Acute prostatitis: Secondary | ICD-10-CM | POA: Insufficient documentation

## 2015-02-17 HISTORY — DX: Acute pancreatitis without necrosis or infection, unspecified: K85.90

## 2015-02-17 LAB — CBC WITH DIFFERENTIAL/PLATELET
BASOS ABS: 0 10*3/uL (ref 0.0–0.1)
BASOS PCT: 0 %
EOS ABS: 0.7 10*3/uL (ref 0.0–0.7)
EOS PCT: 4 %
HCT: 40.2 % (ref 39.0–52.0)
Hemoglobin: 13.5 g/dL (ref 13.0–17.0)
LYMPHS ABS: 7.5 10*3/uL — AB (ref 0.7–4.0)
LYMPHS PCT: 41 %
MCH: 27.8 pg (ref 26.0–34.0)
MCHC: 33.6 g/dL (ref 30.0–36.0)
MCV: 82.7 fL (ref 78.0–100.0)
MONO ABS: 1.8 10*3/uL — AB (ref 0.1–1.0)
MONOS PCT: 10 %
NEUTROS PCT: 45 %
Neutro Abs: 8.3 10*3/uL — ABNORMAL HIGH (ref 1.7–7.7)
PLATELETS: 364 10*3/uL (ref 150–400)
RBC: 4.86 MIL/uL (ref 4.22–5.81)
RDW: 15.1 % (ref 11.5–15.5)
WBC: 18.3 10*3/uL — ABNORMAL HIGH (ref 4.0–10.5)

## 2015-02-17 LAB — URINALYSIS, ROUTINE W REFLEX MICROSCOPIC
Bilirubin Urine: NEGATIVE
GLUCOSE, UA: NEGATIVE mg/dL
Hgb urine dipstick: NEGATIVE
KETONES UR: NEGATIVE mg/dL
LEUKOCYTES UA: NEGATIVE
NITRITE: NEGATIVE
PH: 6 (ref 5.0–8.0)
Protein, ur: NEGATIVE mg/dL
SPECIFIC GRAVITY, URINE: 1.01 (ref 1.005–1.030)

## 2015-02-17 LAB — COMPREHENSIVE METABOLIC PANEL
ALT: 16 U/L — AB (ref 17–63)
AST: 16 U/L (ref 15–41)
Albumin: 4.5 g/dL (ref 3.5–5.0)
Alkaline Phosphatase: 51 U/L (ref 38–126)
Anion gap: 10 (ref 5–15)
BUN: 14 mg/dL (ref 6–20)
CHLORIDE: 103 mmol/L (ref 101–111)
CO2: 25 mmol/L (ref 22–32)
CREATININE: 0.92 mg/dL (ref 0.61–1.24)
Calcium: 8.9 mg/dL (ref 8.9–10.3)
GFR calc Af Amer: >60 mL/min (ref >60)
GFR calc non Af Amer: >60 mL/min (ref >60)
Glucose, Bld: 123 mg/dL — ABNORMAL HIGH (ref 65–99)
POTASSIUM: 3.8 mmol/L (ref 3.5–5.1)
SODIUM: 138 mmol/L (ref 135–145)
Total Bilirubin: 0.4 mg/dL (ref 0.3–1.2)
Total Protein: 7.5 g/dL (ref 6.5–8.1)

## 2015-02-17 LAB — LIPASE, BLOOD: Lipase: 287 U/L — ABNORMAL HIGH (ref 11–51)

## 2015-02-17 MED ORDER — ONDANSETRON HCL 4 MG PO TABS: 4.0000 mg | ORAL_TABLET | Freq: Four times a day (QID) | ORAL | Status: DC

## 2015-02-17 MED ORDER — ONDANSETRON HCL 4 MG/2ML IJ SOLN
4.0000 mg | Freq: Once | INTRAMUSCULAR | Status: AC
  Administered 2015-02-17: 4 mg via INTRAVENOUS
  Filled 2015-02-17: qty 2

## 2015-02-17 MED ORDER — OXYCODONE HCL 5 MG PO TABS: 21.0000 mg | ORAL_TABLET | ORAL | Status: DC | PRN

## 2015-02-17 MED ORDER — SODIUM CHLORIDE 0.9 % IV BOLUS (SEPSIS)
1000.0000 mL | Freq: Once | INTRAVENOUS | Status: AC
  Administered 2015-02-17: 1000 mL via INTRAVENOUS

## 2015-02-17 MED ORDER — MORPHINE SULFATE (PF) 4 MG/ML IV SOLN
8.0000 mg | Freq: Once | INTRAVENOUS | Status: AC
  Administered 2015-02-17: 8 mg via INTRAVENOUS
  Filled 2015-02-17: qty 2

## 2015-02-17 MED ORDER — CIPROFLOXACIN HCL 500 MG PO TABS
500.0000 mg | ORAL_TABLET | Freq: Once | ORAL | Status: AC
  Administered 2015-02-17: 500 mg via ORAL
  Filled 2015-02-17: qty 1

## 2015-02-17 MED ORDER — HYDROMORPHONE HCL 1 MG/ML IJ SOLN
1.0000 mg | Freq: Once | INTRAMUSCULAR | Status: AC
  Administered 2015-02-17: 1 mg via INTRAVENOUS
  Filled 2015-02-17: qty 1

## 2015-02-17 MED ORDER — CIPROFLOXACIN HCL 750 MG PO TABS: 750.0000 mg | ORAL_TABLET | Freq: Two times a day (BID) | ORAL | Status: DC

## 2015-02-17 MED ORDER — OXYCODONE HCL 5 MG PO TABS: 5.0000 mg | ORAL_TABLET | ORAL | Status: DC | PRN

## 2015-02-17 NOTE — ED Provider Notes (Signed)
CSN: [id number]     Arrival date & time 02/17/15  1639 History  By signing my name below, I, Linus Galas, attest that this documentation has been prepared under the direction and in the presence of Melene Plan, DO. Electronically Signed: Linus Galas, ED Scribe. 02/17/2015. 6:36 PM.   Chief Complaint  Patient presents with  . Abdominal Pain   The history is provided by the patient. No language interpreter was used.   HPI Comments: David Nelson is a 55 y.o. male with a h/o pancreatitis, DM, CAD, GERD, and IBS who presents to the Emergency Department complaining of constant diffuse abdominal pain that began 3 weeks ago. Pt described the pain as a pressure on his abdomen and lower back pain as if someone is "punching him." Pt also reports constipation, ongoing dysuria, and vomiting x1. He states that his abdominal pain is worse after urinating and moving his bowel. Pt reports he last had a bowel movement 2 days ago.  Pt tried hot shower and heating pad with no relief. Pt reports he was seen by a GI specialist. At that time, plan included coloscopy and endoscopy for further evaluation. However, pt was unable to schedule due to lack of insurance. Pt denies any fevers, chills, CP, SOB, testicular pain, penile pain, or any other associated sx. Pt denies any abdominal surgeries.   Past Medical History  Diagnosis Date  . Diabetes mellitus without complication (HCC)   . IBS (irritable bowel syndrome)   . CAD (coronary artery disease)   . Hypertension   . GERD (gastroesophageal reflux disease)   . Pancreatitis    History reviewed. No pertinent past surgical history. Family History  Problem Relation Age of Onset  . Family history unknown: Yes   Social History  Substance Use Topics  . Smoking status: Current Every Day Smoker -- 0.50 packs/day    Types: Cigarettes  . Smokeless tobacco: None  . Alcohol Use: No    Review of Systems  Constitutional: Negative for fever and chills.  HENT:  Negative for congestion and facial swelling.   Eyes: Negative for discharge and visual disturbance.  Respiratory: Negative for shortness of breath.   Cardiovascular: Negative for chest pain and palpitations.  Gastrointestinal: Positive for vomiting, abdominal pain and constipation.  Genitourinary: Positive for dysuria. Negative for penile pain and testicular pain.  Musculoskeletal: Positive for back pain. Negative for myalgias and arthralgias.  Skin: Negative for color change and rash.  Neurological: Negative for tremors, syncope and headaches.  Psychiatric/Behavioral: Negative for confusion and dysphoric mood.      Allergies  Ibuprofen  Home Medications   Prior to Admission medications   Medication Sig Start Date End Date Taking? Authorizing Provider  ciprofloxacin (CIPRO) 750 MG tablet Take 1 tablet (750 mg total) by mouth 2 (two) times daily. 02/17/15   Melene Plan, DO  dicyclomine (BENTYL) 10 MG capsule Take 10 mg by mouth 4 (four) times daily -  before meals and at bedtime.    Historical Provider, MD  lisinopril (PRINIVIL,ZESTRIL) 10 MG tablet Take 10 mg by mouth daily.    Historical Provider, MD  metFORMIN (GLUCOPHAGE) 1000 MG tablet Take 1,000 mg by mouth 2 (two) times daily with a meal.    Historical Provider, MD  omeprazole (PRILOSEC) 10 MG capsule Take 10 mg by mouth daily.    Historical Provider, MD  ondansetron (ZOFRAN) 4 MG tablet Take 1 tablet (4 mg total) by mouth every 6 (six) hours. 02/17/15   Melene Plan, DO  oxyCODONE (ROXICODONE) 5 MG immediate release tablet Take 1 tablet (5 mg total) by mouth every 4 (four) hours as needed for severe pain. 02/17/15   Melene Plan, DO  rosuvastatin (CRESTOR) 10 MG tablet Take 10 mg by mouth daily.    Historical Provider, MD  sertraline (ZOLOFT) 100 MG tablet Take 100 mg by mouth daily.    Historical Provider, MD   BP 138/85 mmHg  Pulse 72  Temp(Src) 98.4 F (36.9 C) (Oral)  Resp 18  Ht  (1.676 m)  Wt 178 lb (80.74 kg)  BMI 28.74  kg/m2  SpO2 100% Physical Exam  Constitutional: He is oriented to person, place, and time. He appears well-developed and well-nourished.  HENT:  Head: Normocephalic and atraumatic.  Eyes: EOM are normal. Pupils are equal, round, and reactive to light.  Neck: Normal range of motion. Neck supple. No JVD present.  Cardiovascular: Normal rate and regular rhythm.  Exam reveals no gallop and no friction rub.   No murmur heard. Pulmonary/Chest: No respiratory distress. He has no wheezes.  Abdominal: He exhibits no distension. There is no rebound and no guarding.  Diffuse abdominal tenderness, mildly worse in epigastrium, LLQ and RLQ. Tender bilateral inguinal canal. No hernia. No testicular tenderness. No Penile discharge. Exquisite tenderness to prostate.   Musculoskeletal: Normal range of motion.  Neurological: He is alert and oriented to person, place, and time.  Skin: No rash noted. No pallor.  Psychiatric: He has a normal mood and affect. His behavior is normal.  Nursing note and vitals reviewed.   ED Course  Procedures  DIAGNOSTIC STUDIES: Oxygen Saturation is 99% on room air, normal by my interpretation.    COORDINATION OF CARE: 6:03 PM Will give fluids, Morphine, Zofran and Cipro. Will order Urinalysis and blood work. Discussed treatment plan with pt at bedside and pt agreed to plan.   Labs Review Labs Reviewed  CBC WITH DIFFERENTIAL/PLATELET - Abnormal; Notable for the following:    WBC 18.3 (*)    Neutro Abs 8.3 (*)    Lymphs Abs 7.5 (*)    Monocytes Absolute 1.8 (*)    All other components within normal limits  COMPREHENSIVE METABOLIC PANEL - Abnormal; Notable for the following:    Glucose, Bld 123 (*)    ALT 16 (*)    All other components within normal limits  LIPASE, BLOOD - Abnormal; Notable for the following:    Lipase 287 (*)    All other components within normal limits  URINALYSIS, ROUTINE W REFLEX MICROSCOPIC (NOT AT [facility])  PATHOLOGIST SMEAR REVIEW     Imaging Review No results found. I have personally reviewed and evaluated these images and lab results as part of my medical decision-making.   EKG Interpretation None      MDM   Final diagnoses:  Acute pancreatitis, unspecified pancreatitis type  Acute prostatitis    55 yo M with hx of pancreatitis with lower abdominal pain.  Constant, some urinary symptoms.  Found to have pancreatitis by labs, though minimal epigastric pain.  Exquisite TTP about the prostate, will treat for prostatitis.  Pain well controlled in the ED, will have follow up with urology and GI. Gerrit Friends     I personally performed the services described in this documentation, which was scribed in my presence. The recorded information has been reviewed and is accurate.  I have discussed the diagnosis/risks/treatment options with the patient and family and believe the pt to be eligible for discharge home to follow-up with  PCP, urology, GI. We also discussed returning to the ED immediately if new or worsening sx occur. We discussed the sx which are most concerning (e.g., sudden worsening pain, fever, inability to tolerate by mouth) that necessitate immediate return. Medications administered to the patient during their visit and any new prescriptions provided to the patient are listed below.  Medications given during this visit Medications  sodium chloride 0.9 % bolus 1,000 mL (0 mLs Intravenous Stopped 02/17/15 1945)  morphine 4 MG/ML injection 8 mg (8 mg Intravenous Given 02/17/15 1844)  ondansetron (ZOFRAN) injection 4 mg (4 mg Intravenous Given 02/17/15 1843)  ciprofloxacin (CIPRO) tablet 500 mg (500 mg Oral Given 02/17/15 1848)  HYDROmorphone (DILAUDID) injection 1 mg (1 mg Intravenous Given 02/17/15 1957)  ondansetron (ZOFRAN) injection 4 mg (4 mg Intravenous Given 02/17/15 1956)    Discharge Medication List as of 02/17/2015  8:30 PM    START taking these medications   Details  ciprofloxacin (CIPRO) 750 MG  tablet Take 1 tablet (750 mg total) by mouth 2 (two) times daily., Starting 02/17/2015, Until Discontinued, Print    ondansetron (ZOFRAN) 4 MG tablet Take 1 tablet (4 mg total) by mouth every 6 (six) hours., Starting 02/17/2015, Until Discontinued, Print    oxyCODONE (ROXICODONE) 5 MG immediate release tablet Take 4 tablets (20 mg total) by mouth every 4 (four) hours as needed for severe pain., Starting 02/17/2015, Until Discontinued, Print        The patient appears reasonably screen and/or stabilized for discharge and I doubt any other medical condition or other [facility] requiring further screening, evaluation, or treatment in the ED at this time prior to discharge.     Melene Plan, DO 02/18/15 1141

## 2015-02-17 NOTE — Discharge Instructions (Signed)
Prostatitis °Prostatitis is redness, soreness, and puffiness (swelling) of the prostate gland. The prostate gland is the walnut-sized gland located just below your bladder. °HOME CARE:  °· Take all medicines as told by your doctor. °· Take warm-water baths (sitz baths) as told by your doctor. °GET HELP IF: °· Your symptoms get worse, not better. °· You have a fever. °GET HELP RIGHT AWAY IF:  °· You have chills. °· You feel sick to your stomach (nauseous) or like you will throw up (vomit). °· You feel lightheaded or like you will pass out (faint). °· You are unable to pee (urinate). °· You have blood or blood clumps (clots) in your pee (urine). °MAKE SURE YOU: °· Understand these instructions. °· Will watch your condition. °· Will get help right away if you are not doing well or get worse. °  °This information is not intended to replace advice given to you by your health care provider. Make sure you discuss any questions you have with your health care provider. °  °Document Released: 07/11/2011 Document Revised: 01/30/2014 Document Reviewed: 07/29/2012 °Elsevier Interactive Patient Education ©2016 [employer] Inc[employer] ° °

## 2015-02-17 NOTE — ED Notes (Signed)
abd pain x 3 weeks, vomited x 1 yesterday-denies diarrhea-NAD-steady gait

## 2015-02-18 LAB — PATHOLOGIST SMEAR REVIEW

## 2015-02-24 ENCOUNTER — Encounter (HOSPITAL_BASED_OUTPATIENT_CLINIC_OR_DEPARTMENT_OTHER): Payer: Self-pay

## 2015-02-24 ENCOUNTER — Emergency Department (HOSPITAL_BASED_OUTPATIENT_CLINIC_OR_DEPARTMENT_OTHER)
Admission: EM | Admit: 2015-02-24 | Discharge: 2015-02-25 | Disposition: A | Discharge status: Home / Self Care | Payer: Self-pay | Attending: Emergency Medicine | Admitting: Emergency Medicine

## 2015-02-24 DIAGNOSIS — I10 Essential (primary) hypertension: Secondary | ICD-10-CM | POA: Insufficient documentation

## 2015-02-24 DIAGNOSIS — R1084 Generalized abdominal pain: Secondary | ICD-10-CM

## 2015-02-24 DIAGNOSIS — Z792 Long term (current) use of antibiotics: Secondary | ICD-10-CM | POA: Insufficient documentation

## 2015-02-24 DIAGNOSIS — Z8719 Personal history of other diseases of the digestive system: Secondary | ICD-10-CM

## 2015-02-24 DIAGNOSIS — F17210 Nicotine dependence, cigarettes, uncomplicated: Secondary | ICD-10-CM | POA: Insufficient documentation

## 2015-02-24 DIAGNOSIS — Z87438 Personal history of other diseases of male genital organs: Secondary | ICD-10-CM

## 2015-02-24 DIAGNOSIS — Z79899 Other long term (current) drug therapy: Secondary | ICD-10-CM | POA: Insufficient documentation

## 2015-02-24 DIAGNOSIS — E119 Type 2 diabetes mellitus without complications: Secondary | ICD-10-CM | POA: Insufficient documentation

## 2015-02-24 DIAGNOSIS — Z7984 Long term (current) use of oral hypoglycemic drugs: Secondary | ICD-10-CM | POA: Insufficient documentation

## 2015-02-24 DIAGNOSIS — K219 Gastro-esophageal reflux disease without esophagitis: Secondary | ICD-10-CM | POA: Insufficient documentation

## 2015-02-24 DIAGNOSIS — I2510 Atherosclerotic heart disease of native coronary artery without angina pectoris: Secondary | ICD-10-CM | POA: Insufficient documentation

## 2015-02-24 MED ORDER — MORPHINE SULFATE (PF) 4 MG/ML IV SOLN
4.0000 mg | Freq: Once | INTRAVENOUS | Status: AC
  Administered 2015-02-25: 4 mg via INTRAVENOUS
  Filled 2015-02-24: qty 1

## 2015-02-24 MED ORDER — SODIUM CHLORIDE 0.9 % IV BOLUS (SEPSIS)
1000.0000 mL | Freq: Once | INTRAVENOUS | Status: AC
  Administered 2015-02-25: 1000 mL via INTRAVENOUS

## 2015-02-24 MED ORDER — ONDANSETRON HCL 4 MG/2ML IJ SOLN
4.0000 mg | Freq: Once | INTRAMUSCULAR | Status: AC
  Administered 2015-02-25: 4 mg via INTRAVENOUS
  Filled 2015-02-24: qty 2

## 2015-02-24 NOTE — ED Provider Notes (Signed)
CSN: [id number]     Arrival date & time 02/24/15  2134 History  By signing my name below, I, David Nelson, attest that this documentation has been prepared under the direction and in the presence of Geoffery Lyons, MD. Electronically Signed: Soijett Nelson, ED Scribe. 02/24/2015. 11:48 PM.   Chief Complaint  Patient presents with  . Abdominal Pain      The history is provided by the patient. No language interpreter was used.    HPI Comments: David Nelson is a 55 y.o. male with a medical hx of IBS, GERD, pancreatitis, HTN, DM, who presents to the Emergency Department complaining of bilateral lower/middle abdominal pain onset 10 years ago worsening 4 weeks ago. He reports that he was here last week and was dx with pancreatitis and prostatitis. He states that he is returning to the ED because he is having worsening pain. He notes that he called urology to set up an appointment and there were no available appointments at the time. He is having associated symptoms of nausea. He states that he has not tried any medications for the relief for his symptoms. He denies any other symptoms.   Per pt chart review: Pt was seen in the ED on 02/17/2015, 12/23/2014, 12/19/2014, 12/15/2014, 08/31/2014, and 08/27/2014 for abdominal pain and/or pancreatitis. At pt most recent visit on 02/17/2015, pt had labs completed. Pt was Rx cipro, zofran, and roxicodone and informed to follow up with his PCP, urology, and GI for further evaluation.     Past Medical History  Diagnosis Date  . Diabetes mellitus without complication (HCC)   . IBS (irritable bowel syndrome)   . CAD (coronary artery disease)   . Hypertension   . GERD (gastroesophageal reflux disease)   . Pancreatitis    History reviewed. No pertinent past surgical history. Family History  Problem Relation Age of Onset  . Family history unknown: Yes   Social History  Substance Use Topics  . Smoking status: Current Every Day Smoker -- 0.50 packs/day     Types: Cigarettes  . Smokeless tobacco: None  . Alcohol Use: No    Review of Systems  A complete 10 system review of systems was obtained and all systems are negative except as noted in the HPI and PMH.   Allergies  Ibuprofen  Home Medications   Prior to Admission medications   Medication Sig Start Date End Date Taking? Authorizing Provider  ciprofloxacin (CIPRO) 750 MG tablet Take 1 tablet (750 mg total) by mouth 2 (two) times daily. 02/17/15   Melene Plan, DO  dicyclomine (BENTYL) 10 MG capsule Take 10 mg by mouth 4 (four) times daily -  before meals and at bedtime.    Historical Provider, MD  lisinopril (PRINIVIL,ZESTRIL) 10 MG tablet Take 10 mg by mouth daily.    Historical Provider, MD  metFORMIN (GLUCOPHAGE) 1000 MG tablet Take 1,000 mg by mouth 2 (two) times daily with a meal.    Historical Provider, MD  omeprazole (PRILOSEC) 10 MG capsule Take 10 mg by mouth daily.    Historical Provider, MD  ondansetron (ZOFRAN) 4 MG tablet Take 1 tablet (4 mg total) by mouth every 6 (six) hours. 02/17/15   Melene Plan, DO  oxyCODONE (ROXICODONE) 5 MG immediate release tablet Take 1 tablet (5 mg total) by mouth every 4 (four) hours as needed for severe pain. 02/17/15   Melene Plan, DO  rosuvastatin (CRESTOR) 10 MG tablet Take 10 mg by mouth daily.    Historical Provider, MD  sertraline (ZOLOFT) 100 MG tablet Take 100 mg by mouth daily.    Historical Provider, MD   BP 129/85 mmHg  Pulse 88  Temp(Src) 98.4 F (36.9 C) (Oral)  Resp 18  Ht  (1.676 m)  Wt 178 lb (80.74 kg)  BMI 28.74 kg/m2  SpO2 96% Physical Exam  Constitutional: He is oriented to person, place, and time. He appears well-developed and well-nourished. No distress.  HENT:  Head: Normocephalic and atraumatic.  Eyes: Conjunctivae are normal. Pupils are equal, round, and reactive to light. No scleral icterus.  Neck: Normal range of motion. Neck supple.  Cardiovascular: Normal rate, regular rhythm and normal heart sounds.  Exam  reveals no gallop and no friction rub.   No murmur heard. Pulmonary/Chest: Effort normal and breath sounds normal. No respiratory distress. He has no wheezes. He has no rales.  Abdominal: Soft. Bowel sounds are normal. He exhibits no distension. There is tenderness. There is no rebound.  TTP over the lower abdomen  Musculoskeletal: Normal range of motion. He exhibits no edema.  Neurological: He is alert and oriented to person, place, and time.  Skin: Skin is warm and dry.  Psychiatric: He has a normal mood and affect. His behavior is normal.  Nursing note and vitals reviewed.   ED Course  Procedures (including critical care time) DIAGNOSTIC STUDIES: Oxygen Saturation is 96% on RA, nl by my interpretation.    COORDINATION OF CARE: 11:47 PM Discussed treatment plan with pt at bedside and pt agreed to plan.    Labs Review Labs Reviewed - No data to display  Imaging Review No results found. I have personally reviewed and evaluated these images and lab results as part of my medical decision-making.   EKG Interpretation None      MDM   Final diagnoses:  None    Patient presents here with complaints of abdominal pain he tells me has been ongoing for 10 years. He is been in the ER multiple times recently and told that he has pancreatitis. His last lipase was 287, however today it is 63. There is no radiographic evidence of pancreatitis on his CT scan, nor are there any other emergent processes. He does have an elevated white count today, however appears to run this white count chronically. His pain has been treated and he is nontoxic and very comfortable appearing. He is surfing the Internet on his tablet and appears to me to be in no discomfort.  He tells me that his primary doctor has called in a prescription for Cipro for his prostatitis. He will be given a dose of this here and discharged to home with instructions to fill this prescription.  I personally performed the services  described in this documentation, which was scribed in my presence. The recorded information has been reviewed and is accurate.       Geoffery Lyons, MD 02/25/15 [phone]

## 2015-02-24 NOTE — ED Notes (Signed)
Pt c/o bilateral lower and middle abdominal pain for the last several weeks, hx of pancreatitis and dx'd with prostatitis last week but states he has not gotten any better since visit last Wednesday.

## 2015-02-25 ENCOUNTER — Emergency Department (HOSPITAL_BASED_OUTPATIENT_CLINIC_OR_DEPARTMENT_OTHER): Payer: Self-pay

## 2015-02-25 LAB — CBC WITH DIFFERENTIAL/PLATELET
BASOS ABS: 0.2 10*3/uL — AB (ref 0.0–0.1)
Basophils Relative: 1 %
EOS ABS: 1 10*3/uL — AB (ref 0.0–0.7)
Eosinophils Relative: 5 %
HCT: 38.4 % — ABNORMAL LOW (ref 39.0–52.0)
HEMOGLOBIN: 12.8 g/dL — AB (ref 13.0–17.0)
LYMPHS ABS: 10 10*3/uL — AB (ref 0.7–4.0)
Lymphocytes Relative: 52 %
MCH: 27.6 pg (ref 26.0–34.0)
MCHC: 33.3 g/dL (ref 30.0–36.0)
MCV: 82.8 fL (ref 78.0–100.0)
MONO ABS: 1.2 10*3/uL — AB (ref 0.1–1.0)
MONOS PCT: 6 %
NEUTROS ABS: 6.9 10*3/uL (ref 1.7–7.7)
Neutrophils Relative %: 36 %
Platelets: 342 10*3/uL (ref 150–400)
RBC: 4.64 MIL/uL (ref 4.22–5.81)
RDW: 15.1 % (ref 11.5–15.5)
WBC: 19.3 10*3/uL — AB (ref 4.0–10.5)

## 2015-02-25 LAB — COMPREHENSIVE METABOLIC PANEL
ALT: 14 U/L — AB (ref 17–63)
AST: 14 U/L — AB (ref 15–41)
Albumin: 4.3 g/dL (ref 3.5–5.0)
Alkaline Phosphatase: 54 U/L (ref 38–126)
Anion gap: 9 (ref 5–15)
BILIRUBIN TOTAL: 0.2 mg/dL — AB (ref 0.3–1.2)
BUN: 11 mg/dL (ref 6–20)
CALCIUM: 9.2 mg/dL (ref 8.9–10.3)
CHLORIDE: 103 mmol/L (ref 101–111)
CO2: 26 mmol/L (ref 22–32)
CREATININE: 0.9 mg/dL (ref 0.61–1.24)
Glucose, Bld: 137 mg/dL — ABNORMAL HIGH (ref 65–99)
Potassium: 3.8 mmol/L (ref 3.5–5.1)
Sodium: 138 mmol/L (ref 135–145)
TOTAL PROTEIN: 7.3 g/dL (ref 6.5–8.1)

## 2015-02-25 LAB — LIPASE, BLOOD: LIPASE: 63 U/L — AB (ref 11–51)

## 2015-02-25 MED ORDER — HYDROCODONE-ACETAMINOPHEN 5-325 MG PO TABS: 1.0000 | ORAL_TABLET | Freq: Four times a day (QID) | ORAL | Status: DC | PRN

## 2015-02-25 MED ORDER — CIPROFLOXACIN HCL 500 MG PO TABS: 500.0000 mg | ORAL_TABLET | Freq: Two times a day (BID) | ORAL | Status: DC

## 2015-02-25 MED ORDER — CIPROFLOXACIN HCL 500 MG PO TABS
500.0000 mg | ORAL_TABLET | Freq: Once | ORAL | Status: AC
  Administered 2015-02-25: 500 mg via ORAL
  Filled 2015-02-25: qty 1

## 2015-02-25 MED ORDER — IOHEXOL 300 MG/ML  SOLN
100.0000 mL | Freq: Once | INTRAMUSCULAR | Status: AC | PRN
  Administered 2015-02-25: 100 mL via INTRAVENOUS

## 2015-02-25 MED ORDER — IOHEXOL 300 MG/ML  SOLN
25.0000 mL | Freq: Once | INTRAMUSCULAR | Status: AC | PRN
  Administered 2015-02-25: 25 mL via ORAL

## 2015-02-25 MED ORDER — MORPHINE SULFATE (PF) 4 MG/ML IV SOLN
4.0000 mg | Freq: Once | INTRAVENOUS | Status: AC
  Administered 2015-02-25: 4 mg via INTRAVENOUS
  Filled 2015-02-25: qty 1

## 2015-02-25 NOTE — Discharge Instructions (Signed)
Cipro as prescribed.  Hydrocodone as prescribed as needed for pain.  Follow-up with gastroenterology if not improving in the next week.   Abdominal Pain, Adult Many things can cause abdominal pain. Usually, abdominal pain is not caused by a disease and will improve without treatment. It can often be observed and treated at home. Your health care provider will do a physical exam and possibly order blood tests and X-rays to help determine the seriousness of your pain. However, in many cases, more time must pass before a clear cause of the pain can be found. Before that point, your health care provider may not know if you need more testing or further treatment. HOME CARE INSTRUCTIONS Monitor your abdominal pain for any changes. The following actions may help to alleviate any discomfort you are experiencing:  Only take over-the-counter or prescription medicines as directed by your health care provider.  Do not take laxatives unless directed to do so by your health care provider.  Try a clear liquid diet (broth, tea, or water) as directed by your health care provider. Slowly move to a bland diet as tolerated. SEEK MEDICAL CARE IF:  You have unexplained abdominal pain.  You have abdominal pain associated with nausea or diarrhea.  You have pain when you urinate or have a bowel movement.  You experience abdominal pain that wakes you in the night.  You have abdominal pain that is worsened or improved by eating food.  You have abdominal pain that is worsened with eating fatty foods.  You have a fever. SEEK IMMEDIATE MEDICAL CARE IF:  Your pain does not go away within 2 hours.  You keep throwing up (vomiting).  Your pain is felt only in portions of the abdomen, such as the right side or the left lower portion of the abdomen.  You pass bloody or black tarry stools. MAKE SURE YOU:  Understand these instructions.  Will watch your condition.  Will get help right away if you are not  doing well or get worse.   This information is not intended to replace advice given to you by your health care provider. Make sure you discuss any questions you have with your health care provider.   Document Released: 10/19/2004 Document Revised: 09/30/2014 Document Reviewed: 09/18/2012 Elsevier Interactive Patient Education [employer]

## 2015-02-25 NOTE — ED Notes (Signed)
C/o abd pain w n/v and constipation x 3 months  Off and on  Was seen here last week for same,  Saw his md yesterday   Was given prescription for antibiotics but did not get  Due to lack on money

## 2015-03-22 ENCOUNTER — Encounter (HOSPITAL_BASED_OUTPATIENT_CLINIC_OR_DEPARTMENT_OTHER): Payer: Self-pay | Admitting: *Deleted

## 2015-03-22 ENCOUNTER — Emergency Department (HOSPITAL_BASED_OUTPATIENT_CLINIC_OR_DEPARTMENT_OTHER)
Admission: EM | Admit: 2015-03-22 | Discharge: 2015-03-22 | Disposition: A | Discharge status: Home / Self Care | Payer: Self-pay | Attending: Emergency Medicine | Admitting: Emergency Medicine

## 2015-03-22 DIAGNOSIS — G8929 Other chronic pain: Secondary | ICD-10-CM | POA: Insufficient documentation

## 2015-03-22 DIAGNOSIS — F1210 Cannabis abuse, uncomplicated: Secondary | ICD-10-CM | POA: Insufficient documentation

## 2015-03-22 DIAGNOSIS — Z7984 Long term (current) use of oral hypoglycemic drugs: Secondary | ICD-10-CM | POA: Insufficient documentation

## 2015-03-22 DIAGNOSIS — I2510 Atherosclerotic heart disease of native coronary artery without angina pectoris: Secondary | ICD-10-CM | POA: Insufficient documentation

## 2015-03-22 DIAGNOSIS — R109 Unspecified abdominal pain: Secondary | ICD-10-CM | POA: Insufficient documentation

## 2015-03-22 DIAGNOSIS — F17210 Nicotine dependence, cigarettes, uncomplicated: Secondary | ICD-10-CM | POA: Insufficient documentation

## 2015-03-22 DIAGNOSIS — I10 Essential (primary) hypertension: Secondary | ICD-10-CM | POA: Insufficient documentation

## 2015-03-22 DIAGNOSIS — Z79899 Other long term (current) drug therapy: Secondary | ICD-10-CM | POA: Insufficient documentation

## 2015-03-22 DIAGNOSIS — Z792 Long term (current) use of antibiotics: Secondary | ICD-10-CM | POA: Insufficient documentation

## 2015-03-22 DIAGNOSIS — K589 Irritable bowel syndrome without diarrhea: Secondary | ICD-10-CM | POA: Insufficient documentation

## 2015-03-22 DIAGNOSIS — R112 Nausea with vomiting, unspecified: Secondary | ICD-10-CM | POA: Insufficient documentation

## 2015-03-22 DIAGNOSIS — K219 Gastro-esophageal reflux disease without esophagitis: Secondary | ICD-10-CM | POA: Insufficient documentation

## 2015-03-22 DIAGNOSIS — E119 Type 2 diabetes mellitus without complications: Secondary | ICD-10-CM | POA: Insufficient documentation

## 2015-03-22 LAB — URINALYSIS, ROUTINE W REFLEX MICROSCOPIC
Bilirubin Urine: NEGATIVE
Glucose, UA: NEGATIVE mg/dL
HGB URINE DIPSTICK: NEGATIVE
Ketones, ur: NEGATIVE mg/dL
LEUKOCYTES UA: NEGATIVE
NITRITE: NEGATIVE
PROTEIN: NEGATIVE mg/dL
Specific Gravity, Urine: 1.019 (ref 1.005–1.030)
pH: 7 (ref 5.0–8.0)

## 2015-03-22 LAB — CBC WITH DIFFERENTIAL/PLATELET
BASOS ABS: 0.1 10*3/uL (ref 0.0–0.1)
BASOS PCT: 1 %
Eosinophils Absolute: 1.3 10*3/uL — ABNORMAL HIGH (ref 0.0–0.7)
Eosinophils Relative: 7 %
HCT: 41.5 % (ref 39.0–52.0)
Hemoglobin: 13.8 g/dL (ref 13.0–17.0)
Lymphocytes Relative: 44 %
Lymphs Abs: 8.5 10*3/uL — ABNORMAL HIGH (ref 0.7–4.0)
MCH: 28.1 pg (ref 26.0–34.0)
MCHC: 33.3 g/dL (ref 30.0–36.0)
MCV: 84.5 fL (ref 78.0–100.0)
MONO ABS: 1.4 10*3/uL — AB (ref 0.1–1.0)
Monocytes Relative: 7 %
NEUTROS ABS: 8.2 10*3/uL — AB (ref 1.7–7.7)
Neutrophils Relative %: 42 %
PLATELETS: 342 10*3/uL (ref 150–400)
RBC: 4.91 MIL/uL (ref 4.22–5.81)
RDW: 16.1 % — ABNORMAL HIGH (ref 11.5–15.5)
WBC: 20 10*3/uL — ABNORMAL HIGH (ref 4.0–10.5)

## 2015-03-22 LAB — COMPREHENSIVE METABOLIC PANEL
ALT: 15 U/L — AB (ref 17–63)
ANION GAP: 11 (ref 5–15)
AST: 19 U/L (ref 15–41)
Albumin: 4.6 g/dL (ref 3.5–5.0)
Alkaline Phosphatase: 51 U/L (ref 38–126)
BUN: 14 mg/dL (ref 6–20)
CHLORIDE: 104 mmol/L (ref 101–111)
CO2: 24 mmol/L (ref 22–32)
Calcium: 9.2 mg/dL (ref 8.9–10.3)
Creatinine, Ser: 0.95 mg/dL (ref 0.61–1.24)
GFR calc non Af Amer: >60 mL/min (ref >60)
Glucose, Bld: 182 mg/dL — ABNORMAL HIGH (ref 65–99)
POTASSIUM: 4.2 mmol/L (ref 3.5–5.1)
SODIUM: 139 mmol/L (ref 135–145)
Total Bilirubin: 0.4 mg/dL (ref 0.3–1.2)
Total Protein: 7.4 g/dL (ref 6.5–8.1)

## 2015-03-22 LAB — RAPID URINE DRUG SCREEN, HOSP PERFORMED
AMPHETAMINES: NOT DETECTED
BENZODIAZEPINES: NOT DETECTED
Barbiturates: NOT DETECTED
Cocaine: NOT DETECTED
Opiates: POSITIVE — AB
TETRAHYDROCANNABINOL: POSITIVE — AB

## 2015-03-22 LAB — LIPASE, BLOOD: LIPASE: 29 U/L (ref 11–51)

## 2015-03-22 MED ORDER — HYDROMORPHONE HCL 1 MG/ML IJ SOLN
1.0000 mg | Freq: Once | INTRAMUSCULAR | Status: AC
  Administered 2015-03-22: 1 mg via INTRAVENOUS
  Filled 2015-03-22: qty 1

## 2015-03-22 MED ORDER — HALOPERIDOL LACTATE 5 MG/ML IJ SOLN
2.0000 mg | Freq: Once | INTRAMUSCULAR | Status: AC
  Administered 2015-03-22: 2 mg via INTRAVENOUS
  Filled 2015-03-22: qty 1

## 2015-03-22 MED ORDER — SODIUM CHLORIDE 0.9 % IV SOLN
1000.0000 mL | Freq: Once | INTRAVENOUS | Status: AC
  Administered 2015-03-22: 1000 mL via INTRAVENOUS

## 2015-03-22 MED ORDER — ONDANSETRON 4 MG PO TBDP
4.0000 mg | ORAL_TABLET | Freq: Once | ORAL | Status: AC
  Administered 2015-03-22: 4 mg via ORAL
  Filled 2015-03-22: qty 1

## 2015-03-22 MED ORDER — METOCLOPRAMIDE HCL 5 MG/ML IJ SOLN
10.0000 mg | Freq: Once | INTRAMUSCULAR | Status: AC
  Administered 2015-03-22: 10 mg via INTRAVENOUS
  Filled 2015-03-22: qty 2

## 2015-03-22 MED ORDER — ONDANSETRON 4 MG PO TBDP: 4.0000 mg | ORAL_TABLET | Freq: Three times a day (TID) | ORAL | Status: DC | PRN

## 2015-03-22 MED ORDER — DIPHENHYDRAMINE HCL 50 MG/ML IJ SOLN
12.5000 mg | Freq: Once | INTRAMUSCULAR | Status: AC
  Administered 2015-03-22: 12.5 mg via INTRAVENOUS
  Filled 2015-03-22: qty 1

## 2015-03-22 MED ORDER — SODIUM CHLORIDE 0.9 % IV SOLN: 1000.0000 mL | INTRAVENOUS | Status: DC

## 2015-03-22 NOTE — ED Provider Notes (Signed)
Patient care assumed from [facility], PA-C at shift change pending nausea control and lab results. For full HPI, see note by initial provider.  In short, 55 year old male with recurrent abdominal pain presenting with acute worsening of chronic pain. Generalized aching with associated nausea and vomiting. History of pancreatitis and gastroparesis. Patient is also a daily marijuana user. On exam, abdomen is soft, nontender without peritoneal signs. Patient symptoms of nausea uncontrolled with Zofran, Benadryl and Reglan. Patient given Dilaudid and Haldol in attempt control nausea.  White blood cell count found to be high at 20. Chart review shows that his white blood cells are chronically elevated at this level. Unlikely to be acute infection. Lipase 29. Glucose 182. No sign of urinary tract infection. Urine positive for marijuana and opiates. Patient reports good symptom control after Dilaudid and Haldol. He is requesting to go home as his abdominal pain and nausea have resolved. Patient is to be discharged per initial provider's instructions with prescription for Zofran and follow up with his primary care provider. Patient is in no acute distress and hemodynamically stable before discharge. I have answered all questions he states understanding.  Filed Vitals:   03/22/15 1236 03/22/15 1428 03/22/15 1558 03/22/15 1801  BP: 158/101 159/82 177/108 126/73  Pulse: 80 68 62 73  Temp: 98.1 F (36.7 C) 98.1 F (36.7 C) 98.5 F (36.9 C) 98 F (36.7 C)  TempSrc: Oral Oral Oral Oral  Resp: '20 20 20 17  ' Weight: 80.74 kg     SpO2: 99% 100% 100% 98%   Results for orders placed or performed during the hospital encounter of 03/22/15 (from the past 48 hour(s))  Lipase, blood     Status: None   Collection Time: 03/22/15  1:15 PM  Result Value Ref Range   Lipase 29 11 - 51 U/L  CBC with Differential/Platelet     Status: Abnormal   Collection Time: 03/22/15  1:15 PM  Result Value Ref Range   WBC 20.0 (H)  4.0 - 10.5 K/uL    Comment: WHITE COUNT CONFIRMED ON SMEAR   RBC 4.91 4.22 - 5.81 MIL/uL   Hemoglobin 13.8 13.0 - 17.0 g/dL   HCT 41.5 39.0 - 52.0 %   MCV 84.5 78.0 - 100.0 fL   MCH 28.1 26.0 - 34.0 pg   MCHC 33.3 30.0 - 36.0 g/dL   RDW 16.1 (H) 11.5 - 15.5 %   Platelets 342 150 - 400 K/uL   Neutrophils Relative % 42 %   Neutro Abs 8.2 (H) 1.7 - 7.7 K/uL   Lymphocytes Relative 44 %   Lymphs Abs 8.5 (H) 0.7 - 4.0 K/uL   Monocytes Relative 7 %   Monocytes Absolute 1.4 (H) 0.1 - 1.0 K/uL   Eosinophils Relative 7 %   Eosinophils Absolute 1.3 (H) 0.0 - 0.7 K/uL   Basophils Relative 1 %   Basophils Absolute 0.1 0.0 - 0.1 K/uL   WBC Morphology ATYPICAL LYMPHOCYTES     Comment: TOXIC GRANULATION VACUOLATED NEUTROPHILS SMUDGE CELLS    RBC Morphology ELLIPTOCYTES     Comment: TEARDROP CELLS  Comprehensive metabolic panel     Status: Abnormal   Collection Time: 03/22/15  1:15 PM  Result Value Ref Range   Sodium 139 135 - 145 mmol/L   Potassium 4.2 3.5 - 5.1 mmol/L   Chloride 104 101 - 111 mmol/L   CO2 24 22 - 32 mmol/L   Glucose, Bld 182 (H) 65 - 99 mg/dL   BUN 14 6 -  20 mg/dL   Creatinine, Ser 0.95 0.61 - 1.24 mg/dL   Calcium 9.2 8.9 - 10.3 mg/dL   Total Protein 7.4 6.5 - 8.1 g/dL   Albumin 4.6 3.5 - 5.0 g/dL   AST 19 15 - 41 U/L   ALT 15 (L) 17 - 63 U/L   Alkaline Phosphatase 51 38 - 126 U/L   Total Bilirubin 0.4 0.3 - 1.2 mg/dL   GFR calc non Af Amer >60 >60 mL/min   GFR calc Af Amer >60 >60 mL/min    Comment: (NOTE) The eGFR has been calculated using the CKD EPI equation. This calculation has not been validated in all clinical situations. eGFR's persistently <60 mL/min signify possible Chronic Kidney Disease.    Anion gap 11 5 - 15  Urinalysis, Routine w reflex microscopic (not at [facility])     Status: None   Collection Time: 03/22/15  3:55 PM  Result Value Ref Range   Color, Urine YELLOW YELLOW   APPearance CLEAR CLEAR   Specific Gravity, Urine 1.019 1.005 - 1.030    pH 7.0 5.0 - 8.0   Glucose, UA NEGATIVE NEGATIVE mg/dL   Hgb urine dipstick NEGATIVE NEGATIVE   Bilirubin Urine NEGATIVE NEGATIVE   Ketones, ur NEGATIVE NEGATIVE mg/dL   Protein, ur NEGATIVE NEGATIVE mg/dL   Nitrite NEGATIVE NEGATIVE   Leukocytes, UA NEGATIVE NEGATIVE    Comment: MICROSCOPIC NOT DONE ON URINES WITH NEGATIVE PROTEIN, BLOOD, LEUKOCYTES, NITRITE, OR GLUCOSE <1000 mg/dL.  Urine rapid drug screen (hosp performed)     Status: Abnormal   Collection Time: 03/22/15  3:55 PM  Result Value Ref Range   Opiates POSITIVE (A) NONE DETECTED   Cocaine NONE DETECTED NONE DETECTED   Benzodiazepines NONE DETECTED NONE DETECTED   Amphetamines NONE DETECTED NONE DETECTED   Tetrahydrocannabinol POSITIVE (A) NONE DETECTED   Barbiturates NONE DETECTED NONE DETECTED    Comment:        DRUG SCREEN FOR MEDICAL PURPOSES ONLY.  IF CONFIRMATION IS NEEDED FOR ANY PURPOSE, NOTIFY LAB WITHIN 5 DAYS.        LOWEST DETECTABLE LIMITS FOR URINE DRUG SCREEN Drug Class       Cutoff (ng/mL) Amphetamine      1000 Barbiturate      200 Benzodiazepine   272 Tricyclics       536 Opiates          300 Cocaine          300 THC              50     Carolos Fecher, PA-C 03/23/15 0129  Veryl Speak, MD 03/24/15 6440

## 2015-03-22 NOTE — ED Notes (Signed)
No urine sample at this time

## 2015-03-22 NOTE — ED Notes (Signed)
Pt has dry heaves and continues to c/o pain 10/10. U/A obtained.

## 2015-03-22 NOTE — Discharge Instructions (Signed)

## 2015-03-22 NOTE — ED Provider Notes (Addendum)
CSN: [id number]     Arrival date & time 03/22/15  1221 History   First MD Initiated Contact with Patient 03/22/15 1258     Chief Complaint  Patient presents with  . Abdominal Pain     (Consider location/radiation/quality/duration/timing/severity/associated sxs/prior Treatment) Patient is a 55 y.o. Nelson presenting with abdominal pain. The history is provided by the patient. No language interpreter was used.  Abdominal Pain Pain location:  Generalized Pain quality: aching   Pain radiates to:  Does not radiate Pain severity:  Severe Onset quality:  Sudden Progression:  Worsening Chronicity:  Recurrent Context: retching   Relieved by:  Nothing Worsened by:  Nothing tried Ineffective treatments:  None tried Associated symptoms: vomiting   Associated symptoms: no anorexia   Risk factors: has not had multiple surgeries     Past Medical History  Diagnosis Date  . Diabetes mellitus without complication (HCC)   . IBS (irritable bowel syndrome)   . CAD (coronary artery disease)   . Hypertension   . GERD (gastroesophageal reflux disease)   . Pancreatitis    History reviewed. No pertinent past surgical history. Family History  Problem Relation Age of Onset  . Family history unknown: Yes   Social History  Substance Use Topics  . Smoking status: Current Every Day Smoker -- 0.50 packs/day    Types: Cigarettes  . Smokeless tobacco: None  . Alcohol Use: No    Review of Systems  Gastrointestinal: Positive for vomiting and abdominal pain. Negative for anorexia.  All other systems reviewed and are negative.     Allergies  Ibuprofen  Home Medications   Prior to Admission medications   Medication Sig Start Date End Date Taking? Authorizing Provider  ciprofloxacin (CIPRO) 500 MG tablet Take 1 tablet (500 mg total) by mouth 2 (two) times daily. 02/25/15   Geoffery Lyons, MD  dicyclomine (BENTYL) 10 MG capsule Take 10 mg by mouth 4 (four) times daily -  before meals and at bedtime.     Historical Provider, MD  HYDROcodone-acetaminophen (NORCO) 5-325 MG tablet Take 1-2 tablets by mouth every 6 (six) hours as needed. 02/25/15   Geoffery Lyons, MD  lisinopril (PRINIVIL,ZESTRIL) 10 MG tablet Take 10 mg by mouth daily.    Historical Provider, MD  metFORMIN (GLUCOPHAGE) 1000 MG tablet Take 1,000 mg by mouth 2 (two) times daily with a meal.    Historical Provider, MD  omeprazole (PRILOSEC) 10 MG capsule Take 10 mg by mouth daily.    Historical Provider, MD  ondansetron (ZOFRAN) 4 MG tablet Take 1 tablet (4 mg total) by mouth every 6 (six) hours. 02/17/15   Melene Plan, DO  oxyCODONE (ROXICODONE) 5 MG immediate release tablet Take 1 tablet (5 mg total) by mouth every 4 (four) hours as needed for severe pain. 02/17/15   Melene Plan, DO  rosuvastatin (CRESTOR) 10 MG tablet Take 10 mg by mouth daily.    Historical Provider, MD  sertraline (ZOLOFT) 100 MG tablet Take 100 mg by mouth daily.    Historical Provider, MD   BP 158/101 mmHg  Pulse 80  Temp(Src) 98.1 F (36.7 C) (Oral)  Resp 20  Wt 80.74 kg  SpO2 99% Physical Exam  Constitutional: He is oriented to person, place, and time. He appears well-developed and well-nourished.  HENT:  Head: Normocephalic and atraumatic.  Eyes: EOM are normal. Pupils are equal, round, and reactive to light.  Neck: Normal range of motion.  Cardiovascular: Normal rate and normal heart sounds.   Pulmonary/Chest: Effort  normal.  Abdominal: He exhibits no distension.  Musculoskeletal: Normal range of motion.  Neurological: He is alert and oriented to person, place, and time.  Psychiatric: He has a normal mood and affect.  Nursing note and vitals reviewed.   ED Course  Procedures (including critical care time) Labs Review Labs Reviewed  URINALYSIS, ROUTINE W REFLEX MICROSCOPIC (NOT AT [facility])  LIPASE, BLOOD  CBC WITH DIFFERENTIAL/PLATELET  COMPREHENSIVE METABOLIC PANEL  URINE RAPID DRUG SCREEN, HOSP PERFORMED    Imaging Review No results found. I  have personally reviewed and evaluated these images and lab results as part of my medical decision-making.   EKG Interpretation None      MDM Pt given zofran, reglan and benadryl.   Dilaudid for pain.    I reviewed records.   Pt has frequent episodes of pancreatitis.     Final diagnoses:  Chronic abdominal pain   Meds ordered this encounter  Medications  . 0.9 %  sodium chloride infusion    Sig:    Followed by  . 0.9 %  sodium chloride infusion    Sig:    Followed by  . 0.9 %  sodium chloride infusion    Sig:   . metoCLOPramide (REGLAN) injection 10 mg    Sig:   . diphenhydrAMINE (BENADRYL) injection 12.5 mg    Sig:   . ondansetron (ZOFRAN-ODT) disintegrating tablet 4 mg    Sig:   . HYDROmorphone (DILAUDID) injection 1 mg    Sig:   . ondansetron (ZOFRAN ODT) 4 MG disintegrating tablet    Sig: Take 1 tablet (4 mg total) by mouth every 8 (eight) hours as needed for nausea or vomiting.    Dispense:  20 tablet    Refill:  0    Order Specific Question:  Supervising Provider    Answer:  Eber Hong [[id number]]    An After Visit Summary was printed and given to the patient. Pt advised he needs to stop using marijuana as this may be part of the reason he has gastoparesis  Elson Areas, PA-C 03/22/15 1627  Azalia Bilis, MD 03/22/15 [address] [place], [place] 03/22/15 1644  Azalia Bilis, MD 03/24/15 [phone]

## 2015-03-22 NOTE — ED Notes (Signed)
Abdominal pain and vomiting. States he was diagnosed with pancreatitis about 3 weeks ago. Has not gotten any better.

## 2015-04-08 ENCOUNTER — Emergency Department (HOSPITAL_BASED_OUTPATIENT_CLINIC_OR_DEPARTMENT_OTHER)
Admission: EM | Admit: 2015-04-08 | Discharge: 2015-04-08 | Disposition: A | Discharge status: Home / Self Care | Payer: Self-pay | Attending: Physician Assistant | Admitting: Physician Assistant

## 2015-04-08 ENCOUNTER — Encounter (HOSPITAL_BASED_OUTPATIENT_CLINIC_OR_DEPARTMENT_OTHER): Payer: Self-pay | Admitting: Emergency Medicine

## 2015-04-08 DIAGNOSIS — R109 Unspecified abdominal pain: Secondary | ICD-10-CM

## 2015-04-08 DIAGNOSIS — I10 Essential (primary) hypertension: Secondary | ICD-10-CM | POA: Insufficient documentation

## 2015-04-08 DIAGNOSIS — R300 Dysuria: Secondary | ICD-10-CM

## 2015-04-08 DIAGNOSIS — Z79899 Other long term (current) drug therapy: Secondary | ICD-10-CM | POA: Insufficient documentation

## 2015-04-08 DIAGNOSIS — Z7984 Long term (current) use of oral hypoglycemic drugs: Secondary | ICD-10-CM | POA: Insufficient documentation

## 2015-04-08 DIAGNOSIS — E119 Type 2 diabetes mellitus without complications: Secondary | ICD-10-CM | POA: Insufficient documentation

## 2015-04-08 DIAGNOSIS — F1210 Cannabis abuse, uncomplicated: Secondary | ICD-10-CM | POA: Insufficient documentation

## 2015-04-08 DIAGNOSIS — I2510 Atherosclerotic heart disease of native coronary artery without angina pectoris: Secondary | ICD-10-CM | POA: Insufficient documentation

## 2015-04-08 DIAGNOSIS — F17210 Nicotine dependence, cigarettes, uncomplicated: Secondary | ICD-10-CM | POA: Insufficient documentation

## 2015-04-08 DIAGNOSIS — N419 Inflammatory disease of prostate, unspecified: Secondary | ICD-10-CM | POA: Insufficient documentation

## 2015-04-08 DIAGNOSIS — K219 Gastro-esophageal reflux disease without esophagitis: Secondary | ICD-10-CM | POA: Insufficient documentation

## 2015-04-08 DIAGNOSIS — K589 Irritable bowel syndrome without diarrhea: Secondary | ICD-10-CM | POA: Insufficient documentation

## 2015-04-08 LAB — COMPREHENSIVE METABOLIC PANEL
ALT: 13 U/L — AB (ref 17–63)
ANION GAP: 9 (ref 5–15)
AST: 16 U/L (ref 15–41)
Albumin: 3.9 g/dL (ref 3.5–5.0)
Alkaline Phosphatase: 45 U/L (ref 38–126)
BUN: 9 mg/dL (ref 6–20)
CHLORIDE: 106 mmol/L (ref 101–111)
CO2: 25 mmol/L (ref 22–32)
Calcium: 8.7 mg/dL — ABNORMAL LOW (ref 8.9–10.3)
Creatinine, Ser: 0.73 mg/dL (ref 0.61–1.24)
Glucose, Bld: 114 mg/dL — ABNORMAL HIGH (ref 65–99)
POTASSIUM: 3.7 mmol/L (ref 3.5–5.1)
SODIUM: 140 mmol/L (ref 135–145)
Total Bilirubin: 0.4 mg/dL (ref 0.3–1.2)
Total Protein: 6.4 g/dL — ABNORMAL LOW (ref 6.5–8.1)

## 2015-04-08 LAB — CBC WITH DIFFERENTIAL/PLATELET
BASOS ABS: 0 10*3/uL (ref 0.0–0.1)
Basophils Relative: 0 %
EOS ABS: 0.3 10*3/uL (ref 0.0–0.7)
Eosinophils Relative: 2 %
HCT: 36.5 % — ABNORMAL LOW (ref 39.0–52.0)
Hemoglobin: 11.8 g/dL — ABNORMAL LOW (ref 13.0–17.0)
LYMPHS ABS: 6.2 10*3/uL — AB (ref 0.7–4.0)
LYMPHS PCT: 42 %
MCH: 27.3 pg (ref 26.0–34.0)
MCHC: 32.3 g/dL (ref 30.0–36.0)
MCV: 84.3 fL (ref 78.0–100.0)
MONOS PCT: 2 %
Monocytes Absolute: 0.3 10*3/uL (ref 0.1–1.0)
Neutro Abs: 8 10*3/uL — ABNORMAL HIGH (ref 1.7–7.7)
Neutrophils Relative %: 54 %
PLATELETS: 331 10*3/uL (ref 150–400)
RBC: 4.33 MIL/uL (ref 4.22–5.81)
RDW: 15.9 % — AB (ref 11.5–15.5)
WBC: 14.8 10*3/uL — AB (ref 4.0–10.5)

## 2015-04-08 LAB — URINALYSIS, ROUTINE W REFLEX MICROSCOPIC
BILIRUBIN URINE: NEGATIVE
Glucose, UA: NEGATIVE mg/dL
Hgb urine dipstick: NEGATIVE
KETONES UR: NEGATIVE mg/dL
Leukocytes, UA: NEGATIVE
NITRITE: NEGATIVE
PH: 7 (ref 5.0–8.0)
Protein, ur: NEGATIVE mg/dL
Specific Gravity, Urine: 1.014 (ref 1.005–1.030)

## 2015-04-08 LAB — RAPID URINE DRUG SCREEN, HOSP PERFORMED
AMPHETAMINES: NOT DETECTED
BENZODIAZEPINES: NOT DETECTED
Barbiturates: NOT DETECTED
Cocaine: NOT DETECTED
OPIATES: NOT DETECTED
TETRAHYDROCANNABINOL: POSITIVE — AB

## 2015-04-08 LAB — LIPASE, BLOOD: LIPASE: 78 U/L — AB (ref 11–51)

## 2015-04-08 MED ORDER — HYDROMORPHONE HCL 1 MG/ML IJ SOLN
1.0000 mg | Freq: Once | INTRAMUSCULAR | Status: AC
  Administered 2015-04-08: 1 mg via INTRAVENOUS
  Filled 2015-04-08: qty 1

## 2015-04-08 MED ORDER — HYDROCODONE-ACETAMINOPHEN 5-325 MG PO TABS: 1.0000 | ORAL_TABLET | ORAL | Status: DC | PRN

## 2015-04-08 MED ORDER — CIPROFLOXACIN HCL 500 MG PO TABS: 500.0000 mg | ORAL_TABLET | Freq: Two times a day (BID) | ORAL | Status: DC

## 2015-04-08 MED FILL — CIPROFLOXACIN HCL 500 MG TA: 500 | 10 days supply | Qty: 20 | Fill #0

## 2015-04-08 MED FILL — HYDROCODON-APAP 5-325: 5-325 | 2 days supply | Qty: 8 | Fill #0

## 2015-04-08 NOTE — Discharge Instructions (Signed)
1. Medications: pain medicine, cipro, usual home medications 2. Treatment: rest, drink plenty of fluids 3. Follow Up: please followup with your primary doctor and with urology for discussion of your diagnoses and further evaluation after today's visit; if you do not have a primary care doctor use the phone number listed in your discharge paperwork to find one; please return to the ER for high fever, severe abdominal pain, new or worsening symptoms   Prostatitis The prostate gland is about the size and shape of a walnut. It is located just below your bladder. It produces one of the components of semen, which is made up of sperm and the fluids that help nourish and transport it out from the testicles. Prostatitis is inflammation of the prostate gland.  There are four types of prostatitis:  Acute bacterial prostatitis. This is the least common type of prostatitis. It starts quickly and usually is associated with a bladder infection, high fever, and shaking chills. It can occur at any age.  Chronic bacterial prostatitis. This is a persistent bacterial infection in the prostate. It usually develops from repeated acute bacterial prostatitis or acute bacterial prostatitis that was not properly treated. It can occur in men of any age but is most common in middle-aged men whose prostate has begun to enlarge. The symptoms are not as severe as those in acute bacterial prostatitis. Discomfort in the part of your body that is in front of your rectum and below your scrotum (perineum), lower abdomen, or in the head of your penis (glans) may represent your primary discomfort.  Chronic prostatitis (nonbacterial). This is the most common type of prostatitis. It is inflammation of the prostate gland that is not caused by a bacterial infection. The cause is unknown and may be associated with a viral infection or autoimmune disorder.  Prostatodynia (pelvic floor disorder). This is associated with increased muscular tone  in the pelvis surrounding the prostate. CAUSES The causes of bacterial prostatitis are bacterial infection. The causes of the other types of prostatitis are unknown.  SYMPTOMS  Symptoms can vary depending upon the type of prostatitis that exists. There can also be overlap in symptoms. Possible symptoms for each type of prostatitis are listed below. Acute Bacterial Prostatitis  Painful urination.  Fever or chills.  Muscle or joint pains.  Low back pain.  Low abdominal pain.  Inability to empty bladder completely. Chronic Bacterial Prostatitis, Chronic Nonbacterial Prostatitis, and Prostatodynia  Sudden urge to urinate.  Frequent urination.  Difficulty starting urine stream.  Weak urine stream.  Discharge from the urethra.  Dribbling after urination.  Rectal pain.  Pain in the testicles, penis, or tip of the penis.  Pain in the perineum.  Problems with sexual function.  Painful ejaculation.  Bloody semen. DIAGNOSIS  In order to diagnose prostatitis, your health care provider will ask about your symptoms. One or more urine samples will be taken and tested (urinalysis). If the urinalysis result is negative for bacteria, your health care provider may use a finger to feel your prostate (digital rectal exam). This exam helps your health care provider determine if your prostate is swollen and tender. It will also produce a specimen of semen that can be analyzed. TREATMENT  Treatment for prostatitis depends on the cause. If a bacterial infection is the cause, it can be treated with antibiotic medicine. In cases of chronic bacterial prostatitis, the use of antibiotics for up to 1 month or 6 weeks may be necessary. Your health care provider may instruct you  to take sitz baths to help relieve pain. A sitz bath is a bath of hot water in which your hips and buttocks are under water. This relaxes the pelvic floor muscles and often helps to relieve the pressure on your prostate. HOME  CARE INSTRUCTIONS   Take all medicines as directed by your health care provider.  Take sitz baths as directed by your health care provider. SEEK MEDICAL CARE IF:   Your symptoms get worse, not better.  You have a fever. SEEK IMMEDIATE MEDICAL CARE IF:   You have chills.  You feel nauseous or vomit.  You feel lightheaded or faint.  You are unable to urinate.  You have blood or blood clots in your urine. MAKE SURE YOU:  Understand these instructions.  Will watch your condition.  Will get help right away if you are not doing well or get worse.   This information is not intended to replace advice given to you by your health care provider. Make sure you discuss any questions you have with your health care provider.   Document Released: 01/07/2000 Document Revised: 01/30/2014 Document Reviewed: 07/29/2012 Elsevier Interactive Patient Education [employer]

## 2015-04-08 NOTE — ED Notes (Addendum)
C/o piercing pains in the lower abdomen since yesterday morning. Feels nauseated but has not vomited and has pain with urination. Hx of pancreatitis.

## 2015-04-08 NOTE — ED Provider Notes (Signed)
CSN: [id number]     Arrival date & time 04/08/15  1408 History   First MD Initiated Contact with Patient 04/08/15 1422     Chief Complaint  Patient presents with  . Abdominal Pain  . Dysuria    HPI   David Nelson is a 55 y.o. male with a PMH of DM, IBS, HTN, GERD, pancreatitis who presents to the ED with diffuse abdominal pain. He states his symptoms started yesterday and been constant since that time. He denies exacerbating or alleviating factors. He reports associated dysuria. He denies fever, chills, nausea, vomiting, diarrhea, constipation, hematochezia, melena, urgency, frequency, penile discharge/pain/swelling, testicular pain/swelling. Per record review, patient has been evaluated in the ED several times for the same symptoms and has been seen in follow-up by GI. He states he has a history of prostatitis and that his symptoms feel similar.   Past Medical History  Diagnosis Date  . Diabetes mellitus without complication (HCC)   . IBS (irritable bowel syndrome)   . CAD (coronary artery disease)   . Hypertension   . GERD (gastroesophageal reflux disease)   . Pancreatitis    History reviewed. No pertinent past surgical history. Family History  Problem Relation Age of Onset  . Family history unknown: Yes   Social History  Substance Use Topics  . Smoking status: Current Every Day Smoker -- 0.50 packs/day    Types: Cigarettes  . Smokeless tobacco: None  . Alcohol Use: No     Review of Systems  Constitutional: Negative for fever and chills.  Gastrointestinal: Positive for abdominal pain. Negative for nausea, vomiting, constipation and blood in stool.  Genitourinary: Positive for dysuria. Negative for urgency, frequency, hematuria, discharge, penile swelling, scrotal swelling, penile pain and testicular pain.  All other systems reviewed and are negative.     Allergies  Ibuprofen  Home Medications   Prior to Admission medications   Medication Sig Start Date End Date  Taking? Authorizing Provider  dicyclomine (BENTYL) 10 MG capsule Take 10 mg by mouth 4 (four) times daily -  before meals and at bedtime.   Yes Historical Provider, MD  gabapentin (NEURONTIN) 300 MG capsule Take 300 mg by mouth at bedtime.   Yes Historical Provider, MD  lisinopril (PRINIVIL,ZESTRIL) 10 MG tablet Take 10 mg by mouth daily.   Yes Historical Provider, MD  metFORMIN (GLUCOPHAGE) 1000 MG tablet Take 1,000 mg by mouth 2 (two) times daily with a meal.   Yes Historical Provider, MD  omeprazole (PRILOSEC) 10 MG capsule Take 10 mg by mouth daily.   Yes Historical Provider, MD  rosuvastatin (CRESTOR) 10 MG tablet Take 10 mg by mouth daily.   Yes Historical Provider, MD  sertraline (ZOLOFT) 100 MG tablet Take 100 mg by mouth daily.   Yes Historical Provider, MD  ciprofloxacin (CIPRO) 500 MG tablet Take 1 tablet (500 mg total) by mouth every 12 (twelve) hours. 04/08/15   Mady Gemma, PA-C  HYDROcodone-acetaminophen (NORCO/VICODIN) 5-325 MG tablet Take 1-2 tablets by mouth every 4 (four) hours as needed. 04/08/15   Mady Gemma, PA-C  ondansetron (ZOFRAN ODT) 4 MG disintegrating tablet Take 1 tablet (4 mg total) by mouth every 8 (eight) hours as needed for nausea or vomiting. 03/22/15   Elson Areas, PA-C  ondansetron (ZOFRAN) 4 MG tablet Take 1 tablet (4 mg total) by mouth every 6 (six) hours. 02/17/15   Melene Plan, DO  oxyCODONE (ROXICODONE) 5 MG immediate release tablet Take 1 tablet (5 mg total) by mouth  every 4 (four) hours as needed for severe pain. 02/17/15   Melene Plan, DO    BP 177/92 mmHg  Pulse 84  Temp(Src) 98.3 F (36.8 C) (Oral)  Resp 18  Ht  (1.676 m)  Wt 78.019 kg  BMI 27.77 kg/m2  SpO2 100% Physical Exam  Constitutional: He is oriented to person, place, and time. He appears well-developed and well-nourished. No distress.  HENT:  Head: Normocephalic and atraumatic.  Right Ear: External ear normal.  Left Ear: External ear normal.  Nose: Nose normal.   Mouth/Throat: Uvula is midline, oropharynx is clear and moist and mucous membranes are normal.  Eyes: Conjunctivae, EOM and lids are normal. Pupils are equal, round, and reactive to light. Right eye exhibits no discharge. Left eye exhibits no discharge. No scleral icterus.  Neck: Normal range of motion. Neck supple.  Cardiovascular: Normal rate, regular rhythm, normal heart sounds, intact distal pulses and normal pulses.   Pulmonary/Chest: Effort normal and breath sounds normal. No respiratory distress. He has no wheezes. He has no rales.  Abdominal: Soft. Normal appearance and bowel sounds are normal. He exhibits no distension and no mass. There is tenderness. There is no rigidity, no rebound, no guarding and no CVA tenderness.  Mild diffuse tenderness to palpation of abdomen. No rebound, guarding, or masses. No CVA tenderness.  Genitourinary: Testes normal and penis normal. Prostate is tender. Right testis shows no mass, no swelling and no tenderness. Left testis shows no mass, no swelling and no tenderness. No penile erythema or penile tenderness. No discharge found.  TTP to prostate.  Musculoskeletal: Normal range of motion. He exhibits no edema or tenderness.  Lymphadenopathy:       Right: No inguinal adenopathy present.       Left: No inguinal adenopathy present.  Neurological: He is alert and oriented to person, place, and time.  Skin: Skin is warm, dry and intact. No rash noted. He is not diaphoretic. No erythema. No pallor.  Psychiatric: He has a normal mood and affect. His speech is normal and behavior is normal.  Nursing note and vitals reviewed.   ED Course  Procedures (including critical care time)  Labs Review Labs Reviewed  CBC WITH DIFFERENTIAL/PLATELET - Abnormal; Notable for the following:    WBC 14.8 (*)    Hemoglobin 11.8 (*)    HCT 36.5 (*)    RDW 15.9 (*)    Neutro Abs 8.0 (*)    Lymphs Abs 6.2 (*)    All other components within normal limits  COMPREHENSIVE  METABOLIC PANEL - Abnormal; Notable for the following:    Glucose, Bld 114 (*)    Calcium 8.7 (*)    Total Protein 6.4 (*)    ALT 13 (*)    All other components within normal limits  LIPASE, BLOOD - Abnormal; Notable for the following:    Lipase 78 (*)    All other components within normal limits  URINE RAPID DRUG SCREEN, HOSP PERFORMED - Abnormal; Notable for the following:    Tetrahydrocannabinol POSITIVE (*)    All other components within normal limits  URINE CULTURE  URINALYSIS, ROUTINE W REFLEX MICROSCOPIC (NOT AT [facility])  GC/CHLAMYDIA PROBE AMP ([place]) NOT AT [facility]    Imaging Review No results found.   I have personally reviewed and evaluated these lab results as part of my medical decision-making.   EKG Interpretation None      MDM   Final diagnoses:  Abdominal pain, unspecified abdominal location  Dysuria  Prostatitis, unspecified prostatitis type    55 year old male presents with abdominal pain. Reports associated dysuria. Per record review, patient has been evaluated in the ED several times for the same symptoms, which he typically attributes to pancreatitis and prostatitis. He notes his symptoms feel characteristic of his typical abdominal pain. Of note, patient had a CT abdomen pelvis on February 2, which was negative for acute abnormality.  Patient is afebrile. Vital signs stable. On exam, he has mild diffuse tenderness to palpation of his abdomen with no rebound, guarding, or masses. No CVA tenderness. No TTP to penis or testes. No penile discharge. Significant TTP to prostate. Will obtain labs and urine. Will give pain medication.   On reassessment of patient, he reports significant symptom improvement. Abdomen soft, nondistended, with mild tenderness to palpation in lower quadrants. CBC remarkable for leukocytosis of 14.8, however appears patient has a chronic leukocytosis and this is improved from prior. CMP unremarkable. Lipase elevated at 78. UA negative  for infection. UDS positive for THC. GC/chlamydia and urine cultures pending.  Patient is nontoxic and well-appearing, feel he is stable for discharge at this time. Symptoms likely related to chronic pancreatitis and prostatitis. Will treat with short course of pain medication and cipro. Patient to follow up with PCP and with urology. Strict return precautions discussed. Patient verbalizes his understanding and is in agreement with plan.  BP 177/92 mmHg  Pulse 84  Temp(Src) 98.3 F (36.8 C) (Oral)  Resp 18  Ht 5\' 6"  (1.676 m)  Wt 78.019 kg  BMI 27.77 kg/m2  SpO2 100%     Mady Gemmalizabeth C Westfall, PA-C 04/08/15 1610  Courteney Lyn Mackuen, MD 04/09/15 1006

## 2015-04-09 LAB — URINE CULTURE: CULTURE: NO GROWTH

## 2015-04-14 ENCOUNTER — Encounter: Payer: Self-pay | Admitting: Urology

## 2015-04-14 DIAGNOSIS — I2510 Atherosclerotic heart disease of native coronary artery without angina pectoris: Secondary | ICD-10-CM | POA: Insufficient documentation

## 2015-04-14 DIAGNOSIS — K219 Gastro-esophageal reflux disease without esophagitis: Secondary | ICD-10-CM | POA: Insufficient documentation

## 2015-04-14 DIAGNOSIS — E119 Type 2 diabetes mellitus without complications: Secondary | ICD-10-CM | POA: Insufficient documentation

## 2015-04-14 DIAGNOSIS — I10 Essential (primary) hypertension: Secondary | ICD-10-CM | POA: Insufficient documentation

## 2015-04-14 DIAGNOSIS — K859 Acute pancreatitis without necrosis or infection, unspecified: Secondary | ICD-10-CM | POA: Insufficient documentation

## 2015-04-14 DIAGNOSIS — K589 Irritable bowel syndrome without diarrhea: Secondary | ICD-10-CM | POA: Insufficient documentation

## 2015-10-14 ENCOUNTER — Encounter (HOSPITAL_BASED_OUTPATIENT_CLINIC_OR_DEPARTMENT_OTHER): Payer: Self-pay | Admitting: Emergency Medicine

## 2015-10-14 ENCOUNTER — Emergency Department (HOSPITAL_BASED_OUTPATIENT_CLINIC_OR_DEPARTMENT_OTHER): Payer: Self-pay

## 2015-10-14 ENCOUNTER — Emergency Department (HOSPITAL_BASED_OUTPATIENT_CLINIC_OR_DEPARTMENT_OTHER)
Admission: EM | Admit: 2015-10-14 | Discharge: 2015-10-14 | Disposition: A | Discharge status: Home / Self Care | Payer: Self-pay | Attending: Emergency Medicine | Admitting: Emergency Medicine

## 2015-10-14 DIAGNOSIS — Z79899 Other long term (current) drug therapy: Secondary | ICD-10-CM | POA: Insufficient documentation

## 2015-10-14 DIAGNOSIS — I2510 Atherosclerotic heart disease of native coronary artery without angina pectoris: Secondary | ICD-10-CM | POA: Insufficient documentation

## 2015-10-14 DIAGNOSIS — K859 Acute pancreatitis without necrosis or infection, unspecified: Secondary | ICD-10-CM | POA: Insufficient documentation

## 2015-10-14 DIAGNOSIS — E119 Type 2 diabetes mellitus without complications: Secondary | ICD-10-CM | POA: Insufficient documentation

## 2015-10-14 DIAGNOSIS — R109 Unspecified abdominal pain: Secondary | ICD-10-CM

## 2015-10-14 DIAGNOSIS — Z7984 Long term (current) use of oral hypoglycemic drugs: Secondary | ICD-10-CM | POA: Insufficient documentation

## 2015-10-14 DIAGNOSIS — I10 Essential (primary) hypertension: Secondary | ICD-10-CM | POA: Insufficient documentation

## 2015-10-14 DIAGNOSIS — F17210 Nicotine dependence, cigarettes, uncomplicated: Secondary | ICD-10-CM | POA: Insufficient documentation

## 2015-10-14 HISTORY — DX: Depression, unspecified: F32.A

## 2015-10-14 HISTORY — DX: Major depressive disorder, single episode, unspecified: F32.9

## 2015-10-14 LAB — COMPREHENSIVE METABOLIC PANEL
ALBUMIN: 4.4 g/dL (ref 3.5–5.0)
ALT: 15 U/L — ABNORMAL LOW (ref 17–63)
ANION GAP: 7 (ref 5–15)
AST: 18 U/L (ref 15–41)
Alkaline Phosphatase: 46 U/L (ref 38–126)
BILIRUBIN TOTAL: 0.4 mg/dL (ref 0.3–1.2)
BUN: 11 mg/dL (ref 6–20)
CHLORIDE: 104 mmol/L (ref 101–111)
CO2: 27 mmol/L (ref 22–32)
CREATININE: 0.99 mg/dL (ref 0.61–1.24)
Calcium: 9.2 mg/dL (ref 8.9–10.3)
GFR calc Af Amer: >60 mL/min (ref >60)
GFR calc non Af Amer: >60 mL/min (ref >60)
GLUCOSE: 75 mg/dL (ref 65–99)
POTASSIUM: 4.5 mmol/L (ref 3.5–5.1)
SODIUM: 138 mmol/L (ref 135–145)
Total Protein: 7.1 g/dL (ref 6.5–8.1)

## 2015-10-14 LAB — CBC
HEMATOCRIT: 37.4 % — AB (ref 39.0–52.0)
Hemoglobin: 12.6 g/dL — ABNORMAL LOW (ref 13.0–17.0)
MCH: 28.4 pg (ref 26.0–34.0)
MCHC: 33.7 g/dL (ref 30.0–36.0)
MCV: 84.4 fL (ref 78.0–100.0)
PLATELETS: 397 10*3/uL (ref 150–400)
RBC: 4.43 MIL/uL (ref 4.22–5.81)
RDW: 16.6 % — AB (ref 11.5–15.5)
WBC: 15.9 10*3/uL — ABNORMAL HIGH (ref 4.0–10.5)

## 2015-10-14 LAB — URINALYSIS, ROUTINE W REFLEX MICROSCOPIC
BILIRUBIN URINE: NEGATIVE
GLUCOSE, UA: NEGATIVE mg/dL
HGB URINE DIPSTICK: NEGATIVE
KETONES UR: 15 mg/dL — AB
LEUKOCYTES UA: NEGATIVE
Nitrite: NEGATIVE
PH: 7 (ref 5.0–8.0)
Protein, ur: NEGATIVE mg/dL
Specific Gravity, Urine: 1.026 (ref 1.005–1.030)

## 2015-10-14 LAB — LIPASE, BLOOD: Lipase: 277 U/L — ABNORMAL HIGH (ref 11–51)

## 2015-10-14 MED ORDER — PROMETHAZINE HCL 25 MG PO TABS: 25.0000 mg | ORAL_TABLET | Freq: Four times a day (QID) | ORAL | 0 refills | Status: DC | PRN

## 2015-10-14 MED ORDER — POLYETHYLENE GLYCOL 3350 17 G PO PACK: 17.0000 g | PACK | Freq: Every day | ORAL | 0 refills | Status: DC

## 2015-10-14 MED ORDER — MORPHINE SULFATE (PF) 4 MG/ML IV SOLN
4.0000 mg | Freq: Once | INTRAVENOUS | Status: AC
  Administered 2015-10-14: 4 mg via INTRAVENOUS
  Filled 2015-10-14: qty 1

## 2015-10-14 MED ORDER — SODIUM CHLORIDE 0.9 % IV BOLUS (SEPSIS)
500.0000 mL | Freq: Once | INTRAVENOUS | Status: AC
  Administered 2015-10-14: 500 mL via INTRAVENOUS

## 2015-10-14 MED ORDER — PROMETHAZINE HCL 25 MG/ML IJ SOLN
12.5000 mg | Freq: Once | INTRAMUSCULAR | Status: AC
  Administered 2015-10-14: 12.5 mg via INTRAVENOUS
  Filled 2015-10-14: qty 1

## 2015-10-14 MED ORDER — OXYCODONE-ACETAMINOPHEN 5-325 MG PO TABS: 1.0000 | ORAL_TABLET | Freq: Four times a day (QID) | ORAL | 0 refills | Status: DC | PRN

## 2015-10-14 MED ORDER — ONDANSETRON HCL 4 MG/2ML IJ SOLN
4.0000 mg | Freq: Once | INTRAMUSCULAR | Status: AC
  Administered 2015-10-14: 4 mg via INTRAVENOUS
  Filled 2015-10-14: qty 2

## 2015-10-14 NOTE — ED Provider Notes (Addendum)
[facility] Provider Note   CSN: [id number] Arrival date & time: 10/14/15  1422     History   Chief Complaint Chief Complaint  Patient presents with  . Abdominal Pain    HPI David Nelson is a 55 y.o. male.  The history is provided by the patient.  Abdominal Pain   Associated symptoms include nausea and vomiting. Pertinent negatives include headaches.  Patient presents with left-sided abdominal pain. His had it for the last month. His had some nausea or vomiting. Pain is dull. Feels like his previous pancreatitis. States she's also been constipated. States that he has had one bowel movement the last 5 days. States that is unusual for him. No blood in stool. No blood in emesis. States he has had some chills. He has had pains like this before. Also states he has pain in his right neck and goes down the arm. States he's had this for a while. States it got worse after moving a dresser with a family member.  Also slight dysuria.   Past Medical History:  Diagnosis Date  . CAD (coronary artery disease)   . Depression   . Diabetes mellitus without complication (HCC)   . GERD (gastroesophageal reflux disease)   . Hypertension   . IBS (irritable bowel syndrome)   . Pancreatitis   . Prostatitis     Patient Active Problem List   Diagnosis Date Noted  . Diabetes mellitus without complication (HCC)   . IBS (irritable bowel syndrome)   . CAD (coronary artery disease)   . Hypertension   . GERD (gastroesophageal reflux disease)   . Pancreatitis     History reviewed. No pertinent surgical history.     Home Medications    Prior to Admission medications   Medication Sig Start Date End Date Taking? Authorizing Provider  dicyclomine (BENTYL) 10 MG capsule Take 10 mg by mouth 4 (four) times daily -  before meals and at bedtime.   Yes Historical Provider, MD  DULoxetine (CYMBALTA) 30 MG capsule Take 30 mg by mouth 2 (two) times daily.   Yes Historical Provider, MD    gabapentin (NEURONTIN) 300 MG capsule Take 300 mg by mouth at bedtime.   Yes Historical Provider, MD  lisinopril (PRINIVIL,ZESTRIL) 10 MG tablet Take 10 mg by mouth daily.   Yes Historical Provider, MD  metFORMIN (GLUCOPHAGE) 1000 MG tablet Take 1,000 mg by mouth 2 (two) times daily with a meal.   Yes Historical Provider, MD  omeprazole (PRILOSEC) 10 MG capsule Take 10 mg by mouth daily.   Yes Historical Provider, MD  rosuvastatin (CRESTOR) 10 MG tablet Take 10 mg by mouth daily.   Yes Historical Provider, MD  ciprofloxacin (CIPRO) 500 MG tablet Take 1 tablet (500 mg total) by mouth every 12 (twelve) hours. 04/08/15   Mady Gemma, PA-C  HYDROcodone-acetaminophen (NORCO/VICODIN) 5-325 MG tablet Take 1-2 tablets by mouth every 4 (four) hours as needed. 04/08/15   Mady Gemma, PA-C  ondansetron (ZOFRAN ODT) 4 MG disintegrating tablet Take 1 tablet (4 mg total) by mouth every 8 (eight) hours as needed for nausea or vomiting. 03/22/15   Elson Areas, PA-C  ondansetron (ZOFRAN) 4 MG tablet Take 1 tablet (4 mg total) by mouth every 6 (six) hours. 02/17/15   Melene Plan, DO  oxyCODONE (ROXICODONE) 5 MG immediate release tablet Take 1 tablet (5 mg total) by mouth every 4 (four) hours as needed for severe pain. 02/17/15   Melene Plan, DO  oxyCODONE-acetaminophen (  PERCOCET/ROXICET) 5-325 MG tablet Take 1-2 tablets by mouth every 6 (six) hours as needed for severe pain. 10/14/15   Benjiman Core, MD  polyethylene glycol [facility]) packet Take 17 g by mouth daily. 10/14/15   Benjiman Core, MD  promethazine (PHENERGAN) 25 MG tablet Take 1 tablet (25 mg total) by mouth every 6 (six) hours as needed for nausea. 10/14/15   Benjiman Core, MD  sertraline (ZOLOFT) 100 MG tablet Take 100 mg by mouth daily.    Historical Provider, MD    Family History Family History  Problem Relation Age of Onset  . Family history unknown: Yes    Social History Social History  Substance Use Topics  . Smoking  status: Current Every Day Smoker    Packs/day: 0.50    Types: Cigarettes  . Smokeless tobacco: Never Used  . Alcohol use No     Allergies   Ibuprofen   Review of Systems Review of Systems  Constitutional: Positive for appetite change.  HENT: Negative for drooling.   Respiratory: Negative for shortness of breath.   Gastrointestinal: Positive for abdominal pain, nausea and vomiting.  Genitourinary: Negative for flank pain.  Musculoskeletal: Negative for gait problem.  Neurological: Negative for headaches.  All other systems reviewed and are negative.    Physical Exam Updated Vital Signs BP 115/71 (BP Location: Left Arm)   Pulse 65   Temp 98.7 F (37.1 C) (Oral)   Resp 18   Ht 5\' 6"  (1.676 m)   Wt 182 lb (82.6 kg)   SpO2 98%   BMI 29.38 kg/m   Physical Exam  Constitutional: He appears well-developed.  HENT:  Head: Atraumatic.  Neck: Neck supple.  Cardiovascular: Normal rate.   Pulmonary/Chest: Effort normal.  Abdominal: There is tenderness.  Epigastric tenderness without rebound or guarding.  Musculoskeletal: He exhibits no edema.  Neurological: He is alert.  Skin: Skin is warm.     ED Treatments / Results  Labs (all labs ordered are listed, but only abnormal results are displayed) Labs Reviewed  LIPASE, BLOOD - Abnormal; Notable for the following:       Result Value   Lipase 277 (*)    All other components within normal limits  COMPREHENSIVE METABOLIC PANEL - Abnormal; Notable for the following:    ALT 15 (*)    All other components within normal limits  CBC - Abnormal; Notable for the following:    WBC 15.9 (*)    Hemoglobin 12.6 (*)    HCT 37.4 (*)    RDW 16.6 (*)    All other components within normal limits  URINALYSIS, ROUTINE W REFLEX MICROSCOPIC (NOT AT [facility]) - Abnormal; Notable for the following:    Color, Urine AMBER (*)    Ketones, ur 15 (*)    All other components within normal limits    EKG  EKG Interpretation None        Radiology Dg Abd 2 Views  Result Date: 10/14/2015 CLINICAL DATA:  55 y/o M; 5 days of constipation and abdominal pain. EXAM: ABDOMEN - 2 VIEW COMPARISON:  None. FINDINGS: The bowel gas pattern is normal. There is no evidence of free air. No radio-opaque calculi or other significant radiographic abnormality is seen. Large amount of stool throughout the colon. IMPRESSION: Normal bowel gas pattern. Large amount of stool throughout the colon. Electronically Signed   By: Mitzi Hansen M.D.   On: 10/14/2015 16:05    Procedures Procedures (including critical care time)  Medications Ordered in ED Medications  sodium chloride 0.9 % bolus 500 mL (0 mLs Intravenous Stopped 10/14/15 1751)  ondansetron (ZOFRAN) injection 4 mg (4 mg Intravenous Given 10/14/15 1720)  morphine 4 MG/ML injection 4 mg (4 mg Intravenous Given 10/14/15 1721)  morphine 4 MG/ML injection 4 mg (4 mg Intravenous Given 10/14/15 1903)  promethazine (PHENERGAN) injection 12.5 mg (12.5 mg Intravenous Given 10/14/15 1857)     Initial Impression / Assessment and Plan / ED Course  I have reviewed the triage vital signs and the nursing notes.  Pertinent labs & imaging results that were available during my care of the patient were reviewed by me and considered in my medical decision making (see chart for details).  Clinical Course    Patient with pancreatitis. Abdominal pain. Labs reassuring. Tolerated orals here. Will discharge home with pain meds and antiemetics. Also has had constipation. Will give mineral lax since he'll also be on pain medicines.  Final Clinical Impressions(s) / ED Diagnoses   Final diagnoses:  Abdominal pain  Acute pancreatitis, unspecified pancreatitis type    New Prescriptions New Prescriptions   OXYCODONE-ACETAMINOPHEN (PERCOCET/ROXICET) 5-325 MG TABLET    Take 1-2 tablets by mouth every 6 (six) hours as needed for severe pain.   POLYETHYLENE GLYCOL (MIRALAX) PACKET    Take 17 g by mouth  daily.   PROMETHAZINE (PHENERGAN) 25 MG TABLET    Take 1 tablet (25 mg total) by mouth every 6 (six) hours as needed for nausea.     Benjiman CoreNathan Normon Pettijohn, MD 10/14/15 2040    Benjiman CoreNathan Eshika Reckart, MD 11/25/15 1323

## 2015-10-14 NOTE — ED Triage Notes (Signed)
Pt here with severe abdominal pain x1 month. Hx of diagnosis of pancreatitis,  IBS and delayed bladder emptying. Forcing self to urinate and to have bowel movements. Reports N/V denies diarrhea or fever.

## 2015-10-14 NOTE — ED Notes (Signed)
Pt drinking sprite without diff

## 2015-10-14 NOTE — ED Notes (Signed)
DIET SPRITE GIVEN TO PT PER HIS REQUEST.

## 2015-11-03 ENCOUNTER — Encounter (HOSPITAL_BASED_OUTPATIENT_CLINIC_OR_DEPARTMENT_OTHER): Payer: Self-pay | Admitting: Emergency Medicine

## 2015-11-03 ENCOUNTER — Emergency Department (HOSPITAL_BASED_OUTPATIENT_CLINIC_OR_DEPARTMENT_OTHER)
Admission: EM | Admit: 2015-11-03 | Discharge: 2015-11-04 | Disposition: A | Discharge status: Home / Self Care | Payer: Self-pay | Attending: Emergency Medicine | Admitting: Emergency Medicine

## 2015-11-03 ENCOUNTER — Emergency Department (HOSPITAL_BASED_OUTPATIENT_CLINIC_OR_DEPARTMENT_OTHER): Payer: Self-pay

## 2015-11-03 DIAGNOSIS — I10 Essential (primary) hypertension: Secondary | ICD-10-CM | POA: Insufficient documentation

## 2015-11-03 DIAGNOSIS — F17210 Nicotine dependence, cigarettes, uncomplicated: Secondary | ICD-10-CM | POA: Insufficient documentation

## 2015-11-03 DIAGNOSIS — K859 Acute pancreatitis without necrosis or infection, unspecified: Secondary | ICD-10-CM | POA: Insufficient documentation

## 2015-11-03 DIAGNOSIS — J209 Acute bronchitis, unspecified: Secondary | ICD-10-CM | POA: Insufficient documentation

## 2015-11-03 DIAGNOSIS — Z7984 Long term (current) use of oral hypoglycemic drugs: Secondary | ICD-10-CM | POA: Insufficient documentation

## 2015-11-03 DIAGNOSIS — I2510 Atherosclerotic heart disease of native coronary artery without angina pectoris: Secondary | ICD-10-CM | POA: Insufficient documentation

## 2015-11-03 DIAGNOSIS — Z79899 Other long term (current) drug therapy: Secondary | ICD-10-CM | POA: Insufficient documentation

## 2015-11-03 DIAGNOSIS — E119 Type 2 diabetes mellitus without complications: Secondary | ICD-10-CM | POA: Insufficient documentation

## 2015-11-03 HISTORY — DX: Peripheral vascular disease, unspecified: I73.9

## 2015-11-03 NOTE — ED Triage Notes (Signed)
Pt c/o abd pain x 3 days with vomiting, states is consistent with his pancreatitis. Pt also states he has chest tightness with shob x 1 month.

## 2015-11-04 LAB — CBC WITH DIFFERENTIAL/PLATELET
BASOS ABS: 0.2 10*3/uL — AB (ref 0.0–0.1)
Basophils Relative: 1 %
EOS ABS: 0.8 10*3/uL — AB (ref 0.0–0.7)
Eosinophils Relative: 4 %
HCT: 37.3 % — ABNORMAL LOW (ref 39.0–52.0)
HEMOGLOBIN: 12.6 g/dL — AB (ref 13.0–17.0)
LYMPHS PCT: 52 %
Lymphs Abs: 9.8 10*3/uL — ABNORMAL HIGH (ref 0.7–4.0)
MCH: 28.4 pg (ref 26.0–34.0)
MCHC: 33.8 g/dL (ref 30.0–36.0)
MCV: 84 fL (ref 78.0–100.0)
MONOS PCT: 9 %
Monocytes Absolute: 1.7 10*3/uL — ABNORMAL HIGH (ref 0.1–1.0)
NEUTROS PCT: 34 %
Neutro Abs: 6.5 10*3/uL (ref 1.7–7.7)
PLATELETS: 339 10*3/uL (ref 150–400)
RBC: 4.44 MIL/uL (ref 4.22–5.81)
RDW: 16 % — ABNORMAL HIGH (ref 11.5–15.5)
WBC: 19 10*3/uL — ABNORMAL HIGH (ref 4.0–10.5)

## 2015-11-04 LAB — COMPREHENSIVE METABOLIC PANEL
ALBUMIN: 4.3 g/dL (ref 3.5–5.0)
ALK PHOS: 49 U/L (ref 38–126)
ALT: 13 U/L — AB (ref 17–63)
AST: 16 U/L (ref 15–41)
Anion gap: 8 (ref 5–15)
BUN: 12 mg/dL (ref 6–20)
CALCIUM: 10.4 mg/dL — AB (ref 8.9–10.3)
CHLORIDE: 102 mmol/L (ref 101–111)
CO2: 27 mmol/L (ref 22–32)
CREATININE: 0.99 mg/dL (ref 0.61–1.24)
GFR calc Af Amer: >60 mL/min (ref >60)
GFR calc non Af Amer: >60 mL/min (ref >60)
GLUCOSE: 134 mg/dL — AB (ref 65–99)
Potassium: 4.3 mmol/L (ref 3.5–5.1)
SODIUM: 137 mmol/L (ref 135–145)
Total Bilirubin: 0.5 mg/dL (ref 0.3–1.2)
Total Protein: 7.1 g/dL (ref 6.5–8.1)

## 2015-11-04 LAB — LIPASE, BLOOD: LIPASE: 101 U/L — AB (ref 11–51)

## 2015-11-04 LAB — ETHANOL: Alcohol, Ethyl (B): <5 mg/dL (ref <5)

## 2015-11-04 LAB — TROPONIN I: Troponin I: <0.03 ng/mL (ref <0.03)

## 2015-11-04 MED ORDER — ALBUTEROL SULFATE HFA 108 (90 BASE) MCG/ACT IN AERS
2.0000 | INHALATION_SPRAY | RESPIRATORY_TRACT | Status: DC | PRN
  Administered 2015-11-04: 2 via RESPIRATORY_TRACT
  Filled 2015-11-04: qty 6.7

## 2015-11-04 MED ORDER — HYDROMORPHONE HCL 1 MG/ML IJ SOLN
1.0000 mg | Freq: Once | INTRAMUSCULAR | Status: AC
  Administered 2015-11-04: 1 mg via INTRAVENOUS
  Filled 2015-11-04: qty 1

## 2015-11-04 MED ORDER — ONDANSETRON HCL 4 MG/2ML IJ SOLN
4.0000 mg | Freq: Once | INTRAMUSCULAR | Status: AC
  Administered 2015-11-04: 4 mg via INTRAVENOUS
  Filled 2015-11-04: qty 2

## 2015-11-04 MED ORDER — SODIUM CHLORIDE 0.9 % IV BOLUS (SEPSIS)
1000.0000 mL | Freq: Once | INTRAVENOUS | Status: AC
  Administered 2015-11-04: 1000 mL via INTRAVENOUS

## 2015-11-04 MED ORDER — OXYCODONE-ACETAMINOPHEN 5-325 MG PO TABS: 1.0000 | ORAL_TABLET | Freq: Four times a day (QID) | ORAL | 0 refills | Status: DC | PRN

## 2015-11-04 NOTE — ED Notes (Signed)
MD at bedside. 

## 2015-11-04 NOTE — ED Provider Notes (Signed)
[facility] Provider Note: David Dell, MD, FACEP  CSN: [id number] MRN: [id number] ARRIVAL: 11/03/15 at 2326   CHIEF COMPLAINT  Abdominal Pain   HISTORY OF PRESENT ILLNESS  David Nelson is a 55 y.o. male with a history of pancreatitis. He is here with a three-day history of pain in his lower abdomen consistent with previous episodes of pancreatitis. He has had nausea and vomiting but no diarrhea. He thinks he may be dehydrated. Pain is worse with movement or palpation. He denies recent alcohol use. He is also having shortness of breath with a rattly productive cough for about the past month. He is a smoker.   Past Medical History:  Diagnosis Date  . CAD (coronary artery disease)   . Depression   . Diabetes mellitus without complication (HCC)   . GERD (gastroesophageal reflux disease)   . Hypertension   . IBS (irritable bowel syndrome)   . Pancreatitis   . Prostatitis   . PVD (peripheral vascular disease) (HCC)     No past surgical history on file.  Family History  Problem Relation Age of Onset  . Family history unknown: Yes    Social History  Substance Use Topics  . Smoking status: Current Every Day Smoker    Packs/day: 0.50    Types: Cigarettes  . Smokeless tobacco: Never Used  . Alcohol use No    Prior to Admission medications   Medication Sig Start Date End Date Taking? Authorizing Provider  ciprofloxacin (CIPRO) 500 MG tablet Take 1 tablet (500 mg total) by mouth every 12 (twelve) hours. 04/08/15   Mady Gemma, PA-C  dicyclomine (BENTYL) 10 MG capsule Take 10 mg by mouth 4 (four) times daily -  before meals and at bedtime.    Historical Provider, MD  DULoxetine (CYMBALTA) 30 MG capsule Take 30 mg by mouth 2 (two) times daily.    Historical Provider, MD  gabapentin (NEURONTIN) 300 MG capsule Take 300 mg by mouth at bedtime.    Historical Provider, MD  HYDROcodone-acetaminophen (NORCO/VICODIN) 5-325 MG tablet Take 1-2 tablets by mouth  every 4 (four) hours as needed. 04/08/15   Mady Gemma, PA-C  lisinopril (PRINIVIL,ZESTRIL) 10 MG tablet Take 10 mg by mouth daily.    Historical Provider, MD  metFORMIN (GLUCOPHAGE) 1000 MG tablet Take 1,000 mg by mouth 2 (two) times daily with a meal.    Historical Provider, MD  omeprazole (PRILOSEC) 10 MG capsule Take 10 mg by mouth daily.    Historical Provider, MD  ondansetron (ZOFRAN ODT) 4 MG disintegrating tablet Take 1 tablet (4 mg total) by mouth every 8 (eight) hours as needed for nausea or vomiting. 03/22/15   Elson Areas, PA-C  ondansetron (ZOFRAN) 4 MG tablet Take 1 tablet (4 mg total) by mouth every 6 (six) hours. 02/17/15   Melene Plan, DO  oxyCODONE (ROXICODONE) 5 MG immediate release tablet Take 1 tablet (5 mg total) by mouth every 4 (four) hours as needed for severe pain. 02/17/15   Melene Plan, DO  oxyCODONE-acetaminophen (PERCOCET/ROXICET) 5-325 MG tablet Take 1-2 tablets by mouth every 6 (six) hours as needed for severe pain. 10/14/15   Benjiman Core, MD  polyethylene glycol [facility]) packet Take 17 g by mouth daily. 10/14/15   Benjiman Core, MD  promethazine (PHENERGAN) 25 MG tablet Take 1 tablet (25 mg total) by mouth every 6 (six) hours as needed for nausea. 10/14/15   Benjiman Core, MD  rosuvastatin (CRESTOR) 10 MG tablet Take 10 mg  by mouth daily.    Historical Provider, MD  sertraline (ZOLOFT) 100 MG tablet Take 100 mg by mouth daily.    Historical Provider, MD    Allergies Ibuprofen   REVIEW OF SYSTEMS  Negative except as noted here or in the History of Present Illness.   PHYSICAL EXAMINATION  Initial Vital Signs Blood pressure 155/67, pulse 87, temperature 98.5 F (36.9 C), temperature source Oral, resp. rate 18, height 5\' 6"  (1.676 m), weight 180 lb (81.6 kg), SpO2 100 %.  Examination General: Well-developed, well-nourished male in no acute distress; appearance consistent with age of record HENT: normocephalic; atraumatic Eyes: pupils equal,  round and reactive to light; extraocular muscles intact Neck: supple Heart: regular rate and rhythm Lungs: clear to auscultation bilaterally Abdomen: soft; nondistended; Mild epigastric tenderness with moderate suprapubic tenderness; no masses or hepatosplenomegaly; bowel sounds present Extremities: No deformity; full range of motion; pulses normal Neurologic: Awake, alert and oriented; motor function intact in all extremities and symmetric; no facial droop Skin: Warm and dry Psychiatric: Normal mood and affect   RESULTS  Summary of this visit's results, reviewed by myself:   EKG Interpretation  Date/Time:  Wednesday November 03 2015 23:42:24 EDT Ventricular Rate:  73 PR Interval:    QRS Duration: 97 QT Interval:  384 QTC Calculation: 424 R Axis:   -6 Text Interpretation:  Sinus rhythm Ventricular premature complex Low voltage, extremity and precordial leads PVCs not seen previously Confirmed by Paticia Moster  MD, Jonny Ruiz ([id number]) on 11/03/2015 11:53:24 PM      Laboratory Studies: Results for orders placed or performed during the hospital encounter of 11/03/15 (from the past 24 hour(s))  CBC with Differential     Status: Abnormal   Collection Time: 11/03/15 11:40 PM  Result Value Ref Range   WBC 19.0 (H) 4.0 - 10.5 K/uL   RBC 4.44 4.22 - 5.81 MIL/uL   Hemoglobin 12.6 (L) 13.0 - 17.0 g/dL   HCT 40.1 (L) 02.7 - 25.3 %   MCV 84.0 78.0 - 100.0 fL   MCH 28.4 26.0 - 34.0 pg   MCHC 33.8 30.0 - 36.0 g/dL   RDW 66.4 (H) 40.3 - 47.4 %   Platelets 339 150 - 400 K/uL   Neutrophils Relative % 34 %   Lymphocytes Relative 52 %   Monocytes Relative 9 %   Eosinophils Relative 4 %   Basophils Relative 1 %   Neutro Abs 6.5 1.7 - 7.7 K/uL   Lymphs Abs 9.8 (H) 0.7 - 4.0 K/uL   Monocytes Absolute 1.7 (H) 0.1 - 1.0 K/uL   Eosinophils Absolute 0.8 (H) 0.0 - 0.7 K/uL   Basophils Absolute 0.2 (H) 0.0 - 0.1 K/uL   RBC Morphology ELLIPTOCYTES    WBC Morphology ATYPICAL LYMPHOCYTES   Comprehensive  metabolic panel     Status: Abnormal   Collection Time: 11/03/15 11:40 PM  Result Value Ref Range   Sodium 137 135 - 145 mmol/L   Potassium 4.3 3.5 - 5.1 mmol/L   Chloride 102 101 - 111 mmol/L   CO2 27 22 - 32 mmol/L   Glucose, Bld 134 (H) 65 - 99 mg/dL   BUN 12 6 - 20 mg/dL   Creatinine, Ser 2.59 0.61 - 1.24 mg/dL   Calcium 56.3 (H) 8.9 - 10.3 mg/dL   Total Protein 7.1 6.5 - 8.1 g/dL   Albumin 4.3 3.5 - 5.0 g/dL   AST 16 15 - 41 U/L   ALT 13 (L) 17 - 63 U/L  Alkaline Phosphatase 49 38 - 126 U/L   Total Bilirubin 0.5 0.3 - 1.2 mg/dL   GFR calc non Af Amer >60 >60 mL/min   GFR calc Af Amer >60 >60 mL/min   Anion gap 8 5 - 15  Troponin I     Status: None   Collection Time: 11/03/15 11:40 PM  Result Value Ref Range   Troponin I <0.03 <0.03 ng/mL  Lipase, blood     Status: Abnormal   Collection Time: 11/03/15 11:40 PM  Result Value Ref Range   Lipase 101 (H) 11 - 51 U/L  Ethanol     Status: None   Collection Time: 11/03/15 11:40 PM  Result Value Ref Range   Alcohol, Ethyl (B) <5 <5 mg/dL   Imaging Studies: Dg Chest 2 View  Result Date: 11/04/2015 CLINICAL DATA:  55 y/o  M; chest congestion and shortness of breath. EXAM: CHEST  2 VIEW COMPARISON:  12/09/2013 chest CT FINDINGS: The heart size and mediastinal contours are within normal limits. Both lungs are clear. The visualized skeletal structures are unremarkable. IMPRESSION: No active cardiopulmonary disease. Electronically Signed   By: Mitzi HansenLance  Furusawa-Stratton M.D.   On: 11/04/2015 00:42    ED COURSE  Nursing notes and initial vitals signs, including pulse oximetry, reviewed.  Vitals:   11/04/15 0000 11/04/15 0057 11/04/15 0100 11/04/15 0130  BP: 134/71 130/67 121/64 118/66  Pulse: 74 69 69 70  Resp: 18 18  16   Temp:      TempSrc:      SpO2: 96% 96% 94% 97%  Weight:      Height:       2:02 AM Patient's pain and nausea improved. Lipase is elevated consistent with acute on chronic pancreatitis. His breathing  problems are likely due to his chronic smoking and may represent early COPD.  PROCEDURES    ED DIAGNOSES     ICD-9-CM ICD-10-CM   1. Acute recurrent pancreatitis 577.0 K85.90   2. Acute bronchitis with bronchospasm 466.0 J20.9        Paula LibraJohn Kaleigh Spiegelman, MD 11/04/15 [phone]

## 2015-11-09 ENCOUNTER — Encounter (HOSPITAL_BASED_OUTPATIENT_CLINIC_OR_DEPARTMENT_OTHER): Payer: Self-pay | Admitting: *Deleted

## 2015-11-09 ENCOUNTER — Emergency Department (HOSPITAL_BASED_OUTPATIENT_CLINIC_OR_DEPARTMENT_OTHER)
Admission: EM | Admit: 2015-11-09 | Discharge: 2015-11-10 | Disposition: A | Discharge status: Home / Self Care | Payer: Self-pay | Attending: Emergency Medicine | Admitting: Emergency Medicine

## 2015-11-09 DIAGNOSIS — K861 Other chronic pancreatitis: Secondary | ICD-10-CM

## 2015-11-09 DIAGNOSIS — E119 Type 2 diabetes mellitus without complications: Secondary | ICD-10-CM | POA: Insufficient documentation

## 2015-11-09 DIAGNOSIS — I2510 Atherosclerotic heart disease of native coronary artery without angina pectoris: Secondary | ICD-10-CM | POA: Insufficient documentation

## 2015-11-09 DIAGNOSIS — F17210 Nicotine dependence, cigarettes, uncomplicated: Secondary | ICD-10-CM | POA: Insufficient documentation

## 2015-11-09 DIAGNOSIS — I10 Essential (primary) hypertension: Secondary | ICD-10-CM | POA: Insufficient documentation

## 2015-11-09 DIAGNOSIS — K859 Acute pancreatitis without necrosis or infection, unspecified: Secondary | ICD-10-CM | POA: Insufficient documentation

## 2015-11-09 DIAGNOSIS — Z7984 Long term (current) use of oral hypoglycemic drugs: Secondary | ICD-10-CM | POA: Insufficient documentation

## 2015-11-09 DIAGNOSIS — Z79899 Other long term (current) drug therapy: Secondary | ICD-10-CM | POA: Insufficient documentation

## 2015-11-09 HISTORY — DX: Other chronic pain: G89.29

## 2015-11-09 HISTORY — DX: Unspecified abdominal pain: R10.9

## 2015-11-09 NOTE — ED Notes (Signed)
Pt seen here last week and dx'd with pancreatitis. Given pain meds, but "didn't get better". Dry heaves at times, but no N/V/D. "Hurts to pee."

## 2015-11-09 NOTE — ED Triage Notes (Signed)
Patient is alert and oriented x4.  He is compalinign of RLE pain that started last week.  He statese that he was seen here for the same issue with no relief of pain.  Patient adds that the pain feels like it has become worse since last week.  Currently he rates his pain 10 of 10.

## 2015-11-10 LAB — COMPREHENSIVE METABOLIC PANEL
ALT: 15 U/L — AB (ref 17–63)
ANION GAP: 9 (ref 5–15)
AST: 16 U/L (ref 15–41)
Albumin: 4.3 g/dL (ref 3.5–5.0)
Alkaline Phosphatase: 52 U/L (ref 38–126)
BUN: 10 mg/dL (ref 6–20)
CHLORIDE: 103 mmol/L (ref 101–111)
CO2: 27 mmol/L (ref 22–32)
Calcium: 9.3 mg/dL (ref 8.9–10.3)
Creatinine, Ser: 0.99 mg/dL (ref 0.61–1.24)
GFR calc non Af Amer: >60 mL/min (ref >60)
Glucose, Bld: 126 mg/dL — ABNORMAL HIGH (ref 65–99)
POTASSIUM: 4.1 mmol/L (ref 3.5–5.1)
SODIUM: 139 mmol/L (ref 135–145)
Total Bilirubin: 0.3 mg/dL (ref 0.3–1.2)
Total Protein: 7.2 g/dL (ref 6.5–8.1)

## 2015-11-10 LAB — URINALYSIS, ROUTINE W REFLEX MICROSCOPIC
Bilirubin Urine: NEGATIVE
GLUCOSE, UA: NEGATIVE mg/dL
HGB URINE DIPSTICK: NEGATIVE
Ketones, ur: NEGATIVE mg/dL
Nitrite: NEGATIVE
Protein, ur: NEGATIVE mg/dL
SPECIFIC GRAVITY, URINE: 1.007 (ref 1.005–1.030)
pH: 6.5 (ref 5.0–8.0)

## 2015-11-10 LAB — URINE MICROSCOPIC-ADD ON: RBC / HPF: NONE SEEN RBC/hpf (ref 0–5)

## 2015-11-10 LAB — CBC
HCT: 37.3 % — ABNORMAL LOW (ref 39.0–52.0)
HEMOGLOBIN: 12.5 g/dL — AB (ref 13.0–17.0)
MCH: 28.3 pg (ref 26.0–34.0)
MCHC: 33.5 g/dL (ref 30.0–36.0)
MCV: 84.6 fL (ref 78.0–100.0)
Platelets: 328 10*3/uL (ref 150–400)
RBC: 4.41 MIL/uL (ref 4.22–5.81)
RDW: 16.1 % — ABNORMAL HIGH (ref 11.5–15.5)
WBC: 20.3 10*3/uL — ABNORMAL HIGH (ref 4.0–10.5)

## 2015-11-10 LAB — LIPASE, BLOOD: LIPASE: 22 U/L (ref 11–51)

## 2015-11-10 MED ORDER — OXYCODONE-ACETAMINOPHEN 5-325 MG PO TABS: 1.0000 | ORAL_TABLET | Freq: Four times a day (QID) | ORAL | 0 refills | Status: DC | PRN

## 2015-11-10 MED ORDER — HYDROMORPHONE HCL 1 MG/ML IJ SOLN
1.0000 mg | Freq: Once | INTRAMUSCULAR | Status: AC | PRN
Start: 2015-11-10 — End: 2015-11-10
  Administered 2015-11-10: 1 mg via INTRAVENOUS
  Filled 2015-11-10: qty 1

## 2015-11-10 MED ORDER — HYDROMORPHONE HCL 1 MG/ML IJ SOLN
1.0000 mg | Freq: Once | INTRAMUSCULAR | Status: AC
  Administered 2015-11-10: 1 mg via INTRAVENOUS
  Filled 2015-11-10: qty 1

## 2015-11-10 MED ORDER — ONDANSETRON HCL 4 MG/2ML IJ SOLN
4.0000 mg | Freq: Once | INTRAMUSCULAR | Status: AC
  Administered 2015-11-10: 4 mg via INTRAVENOUS
  Filled 2015-11-10: qty 2

## 2015-11-10 NOTE — ED Provider Notes (Signed)
[facility] Provider Note: David DellJ. Lane [name], MD, FACEP  CSN: [id number] MRN: [id number] ARRIVAL: 11/09/15 at 2133   CHIEF COMPLAINT  Abdominal Pain   HISTORY OF PRESENT ILLNESS  David Nelson is a [age] y.o. male with a history of chronic pancreatitis. He was seen by myself on October 11 and was released in improved condition. He states his pain returned after about a half day and his pain has not been relieved since. His pain is moderate to severe and is located in his lower abdomen, notably the left suprapubic region. This is the usual location of his pancreatic pain. Unlike previous episodes he has not had vomiting or diarrhea. Pain is exacerbated by urination or defecation.    Past Medical History:  Diagnosis Date  . CAD (coronary artery disease)   . Chronic abdominal pain   . Depression   . Diabetes mellitus without complication (HCC)   . GERD (gastroesophageal reflux disease)   . Hypertension   . IBS (irritable bowel syndrome)   . Pancreatitis   . Prostatitis   . PVD (peripheral vascular disease) (HCC)     History reviewed. No pertinent surgical history.  Family History  Problem Relation Age of Onset  . Family history unknown: Yes    Social History  Substance Use Topics  . Smoking status: Current Every Day Smoker    Packs/day: 0.50    Types: Cigarettes  . Smokeless tobacco: Never Used  . Alcohol use No    Prior to Admission medications   Medication Sig Start Date End Date Taking? Authorizing Provider  ciprofloxacin (CIPRO) 500 MG tablet Take 1 tablet (500 mg total) by mouth every 12 (twelve) hours. 04/08/15   Mady GemmaElizabeth C Westfall, PA-C  dicyclomine (BENTYL) 10 MG capsule Take 10 mg by mouth 4 (four) times daily -  before meals and at bedtime.    Historical Provider, MD  DULoxetine (CYMBALTA) 30 MG capsule Take 30 mg by mouth 2 (two) times daily.    Historical Provider, MD  gabapentin (NEURONTIN) 300 MG capsule Take 300 mg by mouth at bedtime.     Historical Provider, MD  lisinopril (PRINIVIL,ZESTRIL) 10 MG tablet Take 10 mg by mouth daily.    Historical Provider, MD  metFORMIN (GLUCOPHAGE) 1000 MG tablet Take 1,000 mg by mouth 2 (two) times daily with a meal.    Historical Provider, MD  omeprazole (PRILOSEC) 10 MG capsule Take 10 mg by mouth daily.    Historical Provider, MD  ondansetron (ZOFRAN ODT) 4 MG disintegrating tablet Take 1 tablet (4 mg total) by mouth every 8 (eight) hours as needed for nausea or vomiting. 03/22/15   Elson AreasLeslie K Sofia, PA-C  ondansetron (ZOFRAN) 4 MG tablet Take 1 tablet (4 mg total) by mouth every 6 (six) hours. 02/17/15   Melene Planan Floyd, DO  oxyCODONE-acetaminophen (PERCOCET/ROXICET) 5-325 MG tablet Take 1-2 tablets by mouth every 6 (six) hours as needed for severe pain. 11/04/15   Nicola Heinemann, MD  polyethylene glycol [facility](MIRALAX) packet Take 17 g by mouth daily. 10/14/15   Benjiman CoreNathan Pickering, MD  promethazine (PHENERGAN) 25 MG tablet Take 1 tablet (25 mg total) by mouth every 6 (six) hours as needed for nausea. 10/14/15   Benjiman CoreNathan Pickering, MD  rosuvastatin (CRESTOR) 10 MG tablet Take 10 mg by mouth daily.    Historical Provider, MD  sertraline (ZOLOFT) 100 MG tablet Take 100 mg by mouth daily.    Historical Provider, MD    Allergies Ibuprofen   REVIEW OF SYSTEMS  Negative except as noted here or in the History of Present Illness.   PHYSICAL EXAMINATION  Initial Vital Signs Blood pressure 130/75, pulse 66, temperature 98.3 F (36.8 C), temperature source Oral, resp. rate 20, height 5\' 6"  (1.676 m), weight 185 lb (83.9 kg), SpO2 96 %.  Examination General: Well-developed, well-nourished male in no acute distress; appearance consistent with age of record HENT: normocephalic; atraumatic Eyes: pupils equal, round and reactive to light; extraocular muscles intact Neck: supple Heart: regular rate and rhythm Lungs: clear to auscultation bilaterally Abdomen: soft; nondistended; suprapubic tenderness; no masses or  hepatosplenomegaly; bowel sounds present Extremities: No deformity; full range of motion; pulses normal Neurologic: Awake, alert and oriented; motor function intact in all extremities and symmetric; no facial droop Skin: Warm and dry Psychiatric: Normal mood and affect   RESULTS  Summary of this visit's results, reviewed by myself:   EKG Interpretation  Date/Time:    Ventricular Rate:    PR Interval:    QRS Duration:   QT Interval:    QTC Calculation:   R Axis:     Text Interpretation:        Laboratory Studies: Results for orders placed or performed during the hospital encounter of 11/09/15 (from the past 24 hour(s))  Urinalysis, Routine w reflex microscopic     Status: Abnormal   Collection Time: 11/09/15 11:35 PM  Result Value Ref Range   Color, Urine YELLOW YELLOW   APPearance CLEAR CLEAR   Specific Gravity, Urine 1.007 1.005 - 1.030   pH 6.5 5.0 - 8.0   Glucose, UA NEGATIVE NEGATIVE mg/dL   Hgb urine dipstick NEGATIVE NEGATIVE   Bilirubin Urine NEGATIVE NEGATIVE   Ketones, ur NEGATIVE NEGATIVE mg/dL   Protein, ur NEGATIVE NEGATIVE mg/dL   Nitrite NEGATIVE NEGATIVE   Leukocytes, UA SMALL (A) NEGATIVE  Urine microscopic-add on     Status: Abnormal   Collection Time: 11/09/15 11:35 PM  Result Value Ref Range   Squamous Epithelial / LPF 0-5 (A) NONE SEEN   WBC, UA 0-5 0 - 5 WBC/hpf   RBC / HPF NONE SEEN 0 - 5 RBC/hpf   Bacteria, UA RARE (A) NONE SEEN  Lipase, blood     Status: None   Collection Time: 11/09/15 11:50 PM  Result Value Ref Range   Lipase 22 11 - 51 U/L  Comprehensive metabolic panel     Status: Abnormal   Collection Time: 11/09/15 11:50 PM  Result Value Ref Range   Sodium 139 135 - 145 mmol/L   Potassium 4.1 3.5 - 5.1 mmol/L   Chloride 103 101 - 111 mmol/L   CO2 27 22 - 32 mmol/L   Glucose, Bld 126 (H) 65 - 99 mg/dL   BUN 10 6 - 20 mg/dL   Creatinine, Ser 1.61 0.61 - 1.24 mg/dL   Calcium 9.3 8.9 - 09.6 mg/dL   Total Protein 7.2 6.5 - 8.1 g/dL    Albumin 4.3 3.5 - 5.0 g/dL   AST 16 15 - 41 U/L   ALT 15 (L) 17 - 63 U/L   Alkaline Phosphatase 52 38 - 126 U/L   Total Bilirubin 0.3 0.3 - 1.2 mg/dL   GFR calc non Af Amer >60 >60 mL/min   GFR calc Af Amer >60 >60 mL/min   Anion gap 9 5 - 15  CBC     Status: Abnormal   Collection Time: 11/09/15 11:50 PM  Result Value Ref Range   WBC 20.3 (H) 4.0 - 10.5 K/uL  RBC 4.41 4.22 - 5.81 MIL/uL   Hemoglobin 12.5 (L) 13.0 - 17.0 g/dL   HCT 16.1 (L) 09.6 - 04.5 %   MCV 84.6 78.0 - 100.0 fL   MCH 28.3 26.0 - 34.0 pg   MCHC 33.5 30.0 - 36.0 g/dL   RDW 40.9 (H) 81.1 - 91.4 %   Platelets 328 150 - 400 K/uL   Imaging Studies: No results found.  ED COURSE  Nursing notes and initial vitals signs, including pulse oximetry, reviewed.  Vitals:   11/10/15 0030 11/10/15 0100 11/10/15 0130 11/10/15 0200  BP: 130/75 145/74 146/75 134/86  Pulse: 66 65 63 63  Resp:  16 16 16   Temp:      TempSrc:      SpO2: 96% 99% 96% 99%  Weight:      Height:       3:24 AM Patient feeling better after IV medications. He states he is ready to go home. He was advised of new regulations limiting the ED his ability to treat chronic pain. He was advised to follow-up with his PCP as scheduled on November 1 to discuss chronic pain management options.  PROCEDURES    ED DIAGNOSES     ICD-9-CM ICD-10-CM   1. Acute on chronic pancreatitis [facility]) 577.0 K85.90    577.1 K86.1        Paula Libra, MD 11/10/15 [phone]

## 2015-11-10 NOTE — ED Notes (Addendum)
MD at bedside. Consulting civil engineerCharge RN accompanying.

## 2015-12-20 ENCOUNTER — Encounter (HOSPITAL_BASED_OUTPATIENT_CLINIC_OR_DEPARTMENT_OTHER): Payer: Self-pay | Admitting: Adult Health

## 2015-12-20 ENCOUNTER — Emergency Department (HOSPITAL_BASED_OUTPATIENT_CLINIC_OR_DEPARTMENT_OTHER)
Admission: EM | Admit: 2015-12-20 | Discharge: 2015-12-20 | Disposition: A | Discharge status: Home / Self Care | Payer: Self-pay | Attending: Emergency Medicine | Admitting: Emergency Medicine

## 2015-12-20 DIAGNOSIS — F17210 Nicotine dependence, cigarettes, uncomplicated: Secondary | ICD-10-CM | POA: Insufficient documentation

## 2015-12-20 DIAGNOSIS — E119 Type 2 diabetes mellitus without complications: Secondary | ICD-10-CM | POA: Insufficient documentation

## 2015-12-20 DIAGNOSIS — Z7984 Long term (current) use of oral hypoglycemic drugs: Secondary | ICD-10-CM | POA: Insufficient documentation

## 2015-12-20 DIAGNOSIS — I2510 Atherosclerotic heart disease of native coronary artery without angina pectoris: Secondary | ICD-10-CM | POA: Insufficient documentation

## 2015-12-20 DIAGNOSIS — Z79899 Other long term (current) drug therapy: Secondary | ICD-10-CM | POA: Insufficient documentation

## 2015-12-20 DIAGNOSIS — I10 Essential (primary) hypertension: Secondary | ICD-10-CM | POA: Insufficient documentation

## 2015-12-20 DIAGNOSIS — R1030 Lower abdominal pain, unspecified: Secondary | ICD-10-CM | POA: Insufficient documentation

## 2015-12-20 DIAGNOSIS — Z8719 Personal history of other diseases of the digestive system: Secondary | ICD-10-CM | POA: Insufficient documentation

## 2015-12-20 DIAGNOSIS — G8929 Other chronic pain: Secondary | ICD-10-CM | POA: Insufficient documentation

## 2015-12-20 DIAGNOSIS — R109 Unspecified abdominal pain: Secondary | ICD-10-CM

## 2015-12-20 LAB — COMPREHENSIVE METABOLIC PANEL
ALK PHOS: 56 U/L (ref 38–126)
ALT: 21 U/L (ref 17–63)
AST: 18 U/L (ref 15–41)
Albumin: 4.3 g/dL (ref 3.5–5.0)
Anion gap: 9 (ref 5–15)
BUN: 12 mg/dL (ref 6–20)
CALCIUM: 9.2 mg/dL (ref 8.9–10.3)
CHLORIDE: 106 mmol/L (ref 101–111)
CO2: 26 mmol/L (ref 22–32)
CREATININE: 0.93 mg/dL (ref 0.61–1.24)
Glucose, Bld: 114 mg/dL — ABNORMAL HIGH (ref 65–99)
Potassium: 3.6 mmol/L (ref 3.5–5.1)
Sodium: 141 mmol/L (ref 135–145)
Total Bilirubin: 0.4 mg/dL (ref 0.3–1.2)
Total Protein: 7.4 g/dL (ref 6.5–8.1)

## 2015-12-20 LAB — URINALYSIS, ROUTINE W REFLEX MICROSCOPIC
Bilirubin Urine: NEGATIVE
GLUCOSE, UA: NEGATIVE mg/dL
HGB URINE DIPSTICK: NEGATIVE
Ketones, ur: NEGATIVE mg/dL
LEUKOCYTES UA: NEGATIVE
NITRITE: NEGATIVE
PH: 6 (ref 5.0–8.0)
PROTEIN: NEGATIVE mg/dL
SPECIFIC GRAVITY, URINE: 1.006 (ref 1.005–1.030)

## 2015-12-20 LAB — CBC WITH DIFFERENTIAL/PLATELET
BASOS ABS: 0 10*3/uL (ref 0.0–0.1)
Basophils Relative: 0 %
EOS ABS: 1.4 10*3/uL — AB (ref 0.0–0.7)
Eosinophils Relative: 7 %
HCT: 38.1 % — ABNORMAL LOW (ref 39.0–52.0)
Hemoglobin: 12.9 g/dL — ABNORMAL LOW (ref 13.0–17.0)
LYMPHS ABS: 10 10*3/uL — AB (ref 0.7–4.0)
Lymphocytes Relative: 52 %
MCH: 28.2 pg (ref 26.0–34.0)
MCHC: 33.9 g/dL (ref 30.0–36.0)
MCV: 83.4 fL (ref 78.0–100.0)
MONO ABS: 1.4 10*3/uL — AB (ref 0.1–1.0)
Monocytes Relative: 7 %
Neutro Abs: 6.6 10*3/uL (ref 1.7–7.7)
Neutrophils Relative %: 34 %
Platelets: 359 10*3/uL (ref 150–400)
RBC: 4.57 MIL/uL (ref 4.22–5.81)
RDW: 16.2 % — AB (ref 11.5–15.5)
WBC: 19.4 10*3/uL — AB (ref 4.0–10.5)

## 2015-12-20 LAB — LIPASE, BLOOD: LIPASE: 30 U/L (ref 11–51)

## 2015-12-20 MED ORDER — CIPROFLOXACIN HCL 500 MG PO TABS: 500.0000 mg | ORAL_TABLET | Freq: Two times a day (BID) | ORAL | 0 refills | Status: DC

## 2015-12-20 MED ORDER — HYDROMORPHONE HCL 1 MG/ML IJ SOLN
1.0000 mg | Freq: Once | INTRAMUSCULAR | Status: AC
  Administered 2015-12-20: 1 mg via INTRAVENOUS
  Filled 2015-12-20: qty 1

## 2015-12-20 MED ORDER — OXYCODONE-ACETAMINOPHEN 5-325 MG PO TABS: 1.0000 | ORAL_TABLET | ORAL | 0 refills | Status: DC | PRN

## 2015-12-20 MED ORDER — METRONIDAZOLE 500 MG PO TABS
500.0000 mg | ORAL_TABLET | Freq: Once | ORAL | Status: AC
  Administered 2015-12-20: 500 mg via ORAL
  Filled 2015-12-20: qty 1

## 2015-12-20 MED ORDER — CIPROFLOXACIN HCL 500 MG PO TABS
500.0000 mg | ORAL_TABLET | Freq: Once | ORAL | Status: AC
  Administered 2015-12-20: 500 mg via ORAL
  Filled 2015-12-20: qty 1

## 2015-12-20 MED ORDER — SODIUM CHLORIDE 0.9 % IV BOLUS (SEPSIS)
1000.0000 mL | Freq: Once | INTRAVENOUS | Status: AC
  Administered 2015-12-20: 1000 mL via INTRAVENOUS

## 2015-12-20 MED ORDER — METRONIDAZOLE 500 MG PO TABS: 500.0000 mg | ORAL_TABLET | Freq: Three times a day (TID) | ORAL | 0 refills | Status: DC

## 2015-12-20 MED ORDER — ONDANSETRON HCL 4 MG/2ML IJ SOLN
4.0000 mg | Freq: Once | INTRAMUSCULAR | Status: AC
  Administered 2015-12-20: 4 mg via INTRAVENOUS
  Filled 2015-12-20: qty 2

## 2015-12-20 NOTE — ED Notes (Signed)
Dr. Glick at BS 

## 2015-12-20 NOTE — ED Triage Notes (Signed)
Presents with lower abdominal pain and back pain that began a week ago associated with painful urination. Pt reports this feels like his "pancreatitis acting up. I had tried a bunch home remedies without any relief" Denies nausea, vomitng and diarrhea.

## 2015-12-20 NOTE — ED Notes (Signed)
ED Provider at bedside. 

## 2015-12-20 NOTE — ED Provider Notes (Signed)
[facility] Provider Note   CSN: [id number] Arrival date & time: 12/20/15  0030     History   Chief Complaint Chief Complaint  Patient presents with  . Abdominal Pain    HPI David Nelson is a 55 y.o. male.  He states he has a history of pancreatitis and has been having pain for the last week it's typical of his pancreatitis. Pain is across the lower abdomen with radiation to the lower back. It is crampy in nature and getting worse. He rates it a 10/10. He initially had some nausea and dry heaves but those have resolved. He denies fever or chills. Pain is worse when he takes a deep breath and when he urinates. Nothing makes it any better. He denies ethanol consumption.   The history is provided by the patient.    Past Medical History:  Diagnosis Date  . CAD (coronary artery disease)   . Chronic abdominal pain   . Depression   . Diabetes mellitus without complication (HCC)   . GERD (gastroesophageal reflux disease)   . Hypertension   . IBS (irritable bowel syndrome)   . Pancreatitis   . Prostatitis   . PVD (peripheral vascular disease) [facility](HCC)     Patient Active Problem List   Diagnosis Date Noted  . Diabetes mellitus without complication (HCC)   . IBS (irritable bowel syndrome)   . CAD (coronary artery disease)   . Hypertension   . GERD (gastroesophageal reflux disease)   . Pancreatitis     History reviewed. No pertinent surgical history.     Home Medications    Prior to Admission medications   Medication Sig Start Date End Date Taking? Authorizing Provider  ciprofloxacin (CIPRO) 500 MG tablet Take 1 tablet (500 mg total) by mouth every 12 (twelve) hours. 04/08/15   Mady GemmaElizabeth C Westfall, PA-C  dicyclomine (BENTYL) 10 MG capsule Take 10 mg by mouth 4 (four) times daily -  before meals and at bedtime.    Historical Provider, MD  DULoxetine (CYMBALTA) 30 MG capsule Take 30 mg by mouth 2 (two) times daily.    Historical Provider, MD  gabapentin  (NEURONTIN) 300 MG capsule Take 300 mg by mouth at bedtime.    Historical Provider, MD  lisinopril (PRINIVIL,ZESTRIL) 10 MG tablet Take 10 mg by mouth daily.    Historical Provider, MD  metFORMIN (GLUCOPHAGE) 1000 MG tablet Take 1,000 mg by mouth 2 (two) times daily with a meal.    Historical Provider, MD  omeprazole (PRILOSEC) 10 MG capsule Take 10 mg by mouth daily.    Historical Provider, MD  ondansetron (ZOFRAN ODT) 4 MG disintegrating tablet Take 1 tablet (4 mg total) by mouth every 8 (eight) hours as needed for nausea or vomiting. 03/22/15   Elson AreasLeslie K Sofia, PA-C  ondansetron (ZOFRAN) 4 MG tablet Take 1 tablet (4 mg total) by mouth every 6 (six) hours. 02/17/15   Melene Planan Floyd, DO  oxyCODONE-acetaminophen (PERCOCET/ROXICET) 5-325 MG tablet Take 1-2 tablets by mouth every 6 (six) hours as needed for severe pain. 11/10/15   John Molpus, MD  polyethylene glycol [facility](MIRALAX) packet Take 17 g by mouth daily. 10/14/15   Benjiman CoreNathan Pickering, MD  promethazine (PHENERGAN) 25 MG tablet Take 1 tablet (25 mg total) by mouth every 6 (six) hours as needed for nausea. 10/14/15   Benjiman CoreNathan Pickering, MD  rosuvastatin (CRESTOR) 10 MG tablet Take 10 mg by mouth daily.    Historical Provider, MD  sertraline (ZOLOFT) 100 MG tablet Take 100  mg by mouth daily.    Historical Provider, MD    Family History Family History  Problem Relation Age of Onset  . Family history unknown: Yes    Social History Social History  Substance Use Topics  . Smoking status: Current Every Day Smoker    Packs/day: 0.50    Types: Cigarettes  . Smokeless tobacco: Never Used  . Alcohol use No     Allergies   Ibuprofen   Review of Systems Review of Systems  All other systems reviewed and are negative.    Physical Exam Updated Vital Signs BP 148/87 (BP Location: Right Arm)   Pulse 84   Temp 99 F (37.2 C) (Oral)   Resp 18   Ht 5\' 6"  (1.676 m)   Wt 183 lb (83 kg)   SpO2 99%   BMI 29.54 kg/m   Physical Exam  Nursing note and  vitals reviewed.  55 year old male, resting comfortably and in no acute distress. Vital signs are significant for hypertension. Oxygen saturation is 99%, which is normal. Head is normocephalic and atraumatic. PERRLA, EOMI. Oropharynx is clear. Neck is nontender and supple without adenopathy or JVD. Back is nontender in the midline. There is mild tenderness in the lower paralumbar area. There is no CVA tenderness. Lungs are clear without rales, wheezes, or rhonchi. Chest is nontender. Heart has regular rate and rhythm without murmur. Abdomen is soft, flat, with moderate tenderness across lower abdomen. There is no rebound or guarding. There are no masses or hepatosplenomegaly and peristalsis is hypoactive. Extremities have no cyanosis or edema, full range of motion is present. Skin is warm and dry without rash. Neurologic: Mental status is normal, cranial nerves are intact, there are no motor or sensory deficits.  ED Treatments / Results  Labs (all labs ordered are listed, but only abnormal results are displayed) Labs Reviewed  COMPREHENSIVE METABOLIC PANEL - Abnormal; Notable for the following:       Result Value   Glucose, Bld 114 (*)    All other components within normal limits  CBC WITH DIFFERENTIAL/PLATELET - Abnormal; Notable for the following:    WBC 19.4 (*)    Hemoglobin 12.9 (*)    HCT 38.1 (*)    RDW 16.2 (*)    Lymphs Abs 10.0 (*)    Monocytes Absolute 1.4 (*)    Eosinophils Absolute 1.4 (*)    All other components within normal limits  LIPASE, BLOOD  URINALYSIS, ROUTINE W REFLEX MICROSCOPIC (NOT AT [facility])    Procedures Procedures (including critical care time)  Medications Ordered in ED Medications  sodium chloride 0.9 % bolus 1,000 mL (0 mLs Intravenous Stopped 12/20/15 0354)  HYDROmorphone (DILAUDID) injection 1 mg (1 mg Intravenous Given 12/20/15 0235)  ondansetron (ZOFRAN) injection 4 mg (4 mg Intravenous Given 12/20/15 0230)  HYDROmorphone (DILAUDID)  injection 1 mg (1 mg Intravenous Given 12/20/15 0350)  ciprofloxacin (CIPRO) tablet 500 mg (500 mg Oral Given 12/20/15 0348)  metroNIDAZOLE (FLAGYL) tablet 500 mg (500 mg Oral Given 12/20/15 0349)     Initial Impression / Assessment and Plan / ED Course  I have reviewed the triage vital signs and the nursing notes.  Pertinent lab results that were available during my care of the patient were reviewed by me and considered in my medical decision making (see chart for details).  Clinical Course    Abdominal pain which patient relates is typical of his pancreatitis. Location of pain is atypical for pancreatitis and it is unusual  that for him not to have more nausea. Old records are reviewed and he has multiple ED visits for pancreatitis and several CT scans done. CT scans of never shown evidence of pancreatitis but he has had elevated lipase on some occasions. I am somewhat doubtful that he is actually having pancreatitis. I do wonder about possible diverticulitis. On reviewing old CT scans, I believe I do see evidence of existing diverticulosis. He'll be given IV fluids, ondansetron, hydromorphone.  He had partial relief with above noted treatment. Is given a second dose of hydromorphone with significant improvement. Laboratory workup is unremarkable including normal lipase. I have discussed possibility of diverticulitis with the patient, and I'm discharging him with a prescription for ciprofloxacin and metronidazole. He is requesting a prescription for pain medication and states that he only gets pain medication prescribed when he comes to the ED. His record on the [place] a controlled substance reporting website was reviewed confirming that his only narcotic prescriptions were from ED visits. He is given a prescription for 10 oxycodone-acetaminophen tablets. Follow-up with PCP.  Final Clinical Impressions(s) / ED Diagnoses   Final diagnoses:  Chronic abdominal pain    New Prescriptions New  Prescriptions   CIPROFLOXACIN (CIPRO) 500 MG TABLET    Take 1 tablet (500 mg total) by mouth 2 (two) times daily.   METRONIDAZOLE (FLAGYL) 500 MG TABLET    Take 1 tablet (500 mg total) by mouth 3 (three) times daily.   OXYCODONE-ACETAMINOPHEN (PERCOCET) 5-325 MG TABLET    Take 1 tablet by mouth every 4 (four) hours as needed for moderate pain.     Dione Boozeavid Rykker Coviello, MD 12/20/15 [phone]

## 2015-12-20 NOTE — ED Notes (Signed)
Pt states he has a hx of pancreatitis and has been having low abd and back pain x 1 week. Has tried all the usual treatments and meds at home without relief. Also states he has been having some problems urinating for a few days. Denies N/V/D.

## 2015-12-20 NOTE — ED Notes (Signed)
Pt states he feels much better and that his daughter will be driving him home. Sprite given.

## 2015-12-28 ENCOUNTER — Encounter (HOSPITAL_BASED_OUTPATIENT_CLINIC_OR_DEPARTMENT_OTHER): Payer: Self-pay | Admitting: Emergency Medicine

## 2015-12-28 ENCOUNTER — Emergency Department (HOSPITAL_BASED_OUTPATIENT_CLINIC_OR_DEPARTMENT_OTHER): Payer: Self-pay

## 2015-12-28 ENCOUNTER — Emergency Department (HOSPITAL_BASED_OUTPATIENT_CLINIC_OR_DEPARTMENT_OTHER)
Admission: EM | Admit: 2015-12-28 | Discharge: 2015-12-29 | Disposition: A | Discharge status: Home / Self Care | Payer: Self-pay | Attending: Emergency Medicine | Admitting: Emergency Medicine

## 2015-12-28 DIAGNOSIS — R109 Unspecified abdominal pain: Secondary | ICD-10-CM

## 2015-12-28 DIAGNOSIS — Z79899 Other long term (current) drug therapy: Secondary | ICD-10-CM | POA: Insufficient documentation

## 2015-12-28 DIAGNOSIS — R1032 Left lower quadrant pain: Secondary | ICD-10-CM | POA: Insufficient documentation

## 2015-12-28 DIAGNOSIS — G8929 Other chronic pain: Secondary | ICD-10-CM | POA: Insufficient documentation

## 2015-12-28 DIAGNOSIS — I2510 Atherosclerotic heart disease of native coronary artery without angina pectoris: Secondary | ICD-10-CM | POA: Insufficient documentation

## 2015-12-28 DIAGNOSIS — E119 Type 2 diabetes mellitus without complications: Secondary | ICD-10-CM | POA: Insufficient documentation

## 2015-12-28 DIAGNOSIS — I10 Essential (primary) hypertension: Secondary | ICD-10-CM | POA: Insufficient documentation

## 2015-12-28 DIAGNOSIS — F17210 Nicotine dependence, cigarettes, uncomplicated: Secondary | ICD-10-CM | POA: Insufficient documentation

## 2015-12-28 DIAGNOSIS — Z7984 Long term (current) use of oral hypoglycemic drugs: Secondary | ICD-10-CM | POA: Insufficient documentation

## 2015-12-28 LAB — COMPREHENSIVE METABOLIC PANEL
ALBUMIN: 4.4 g/dL (ref 3.5–5.0)
ALK PHOS: 51 U/L (ref 38–126)
ALT: 16 U/L — ABNORMAL LOW (ref 17–63)
AST: 17 U/L (ref 15–41)
Anion gap: 8 (ref 5–15)
BILIRUBIN TOTAL: 0.3 mg/dL (ref 0.3–1.2)
BUN: 11 mg/dL (ref 6–20)
CALCIUM: 9 mg/dL (ref 8.9–10.3)
CO2: 26 mmol/L (ref 22–32)
CREATININE: 0.92 mg/dL (ref 0.61–1.24)
Chloride: 106 mmol/L (ref 101–111)
GFR calc Af Amer: >60 mL/min (ref >60)
GLUCOSE: 121 mg/dL — AB (ref 65–99)
Potassium: 3.6 mmol/L (ref 3.5–5.1)
Sodium: 140 mmol/L (ref 135–145)
TOTAL PROTEIN: 7.5 g/dL (ref 6.5–8.1)

## 2015-12-28 LAB — CBC WITH DIFFERENTIAL/PLATELET
BASOS PCT: 1 %
Basophils Absolute: 0.2 10*3/uL — ABNORMAL HIGH (ref 0.0–0.1)
EOS ABS: 1.3 10*3/uL — AB (ref 0.0–0.7)
EOS PCT: 7 %
HEMATOCRIT: 39.5 % (ref 39.0–52.0)
HEMOGLOBIN: 13 g/dL (ref 13.0–17.0)
Lymphocytes Relative: 47 %
Lymphs Abs: 8.7 10*3/uL — ABNORMAL HIGH (ref 0.7–4.0)
MCH: 27.5 pg (ref 26.0–34.0)
MCHC: 32.9 g/dL (ref 30.0–36.0)
MCV: 83.7 fL (ref 78.0–100.0)
MONO ABS: 1.1 10*3/uL — AB (ref 0.1–1.0)
Monocytes Relative: 6 %
NEUTROS ABS: 7.2 10*3/uL (ref 1.7–7.7)
NEUTROS PCT: 39 %
Platelets: 388 10*3/uL (ref 150–400)
RBC: 4.72 MIL/uL (ref 4.22–5.81)
RDW: 16.6 % — ABNORMAL HIGH (ref 11.5–15.5)
WBC: 18.5 10*3/uL — ABNORMAL HIGH (ref 4.0–10.5)

## 2015-12-28 LAB — URINALYSIS, MICROSCOPIC (REFLEX): Bacteria, UA: NONE SEEN

## 2015-12-28 LAB — URINALYSIS, ROUTINE W REFLEX MICROSCOPIC
BILIRUBIN URINE: NEGATIVE
GLUCOSE, UA: NEGATIVE mg/dL
Hgb urine dipstick: NEGATIVE
KETONES UR: NEGATIVE mg/dL
Leukocytes, UA: NEGATIVE
NITRITE: NEGATIVE
PH: 6 (ref 5.0–8.0)
Protein, ur: 100 mg/dL — AB
SPECIFIC GRAVITY, URINE: 1.024 (ref 1.005–1.030)

## 2015-12-28 LAB — LIPASE, BLOOD: LIPASE: 32 U/L (ref 11–51)

## 2015-12-28 MED ORDER — SODIUM CHLORIDE 0.9 % IV BOLUS (SEPSIS)
500.0000 mL | Freq: Once | INTRAVENOUS | Status: AC
  Administered 2015-12-28: 500 mL via INTRAVENOUS

## 2015-12-28 MED ORDER — IOPAMIDOL (ISOVUE-300) INJECTION 61%
100.0000 mL | Freq: Once | INTRAVENOUS | Status: AC | PRN
  Administered 2015-12-29: 100 mL via INTRAVENOUS

## 2015-12-28 MED ORDER — MORPHINE SULFATE (PF) 4 MG/ML IV SOLN
4.0000 mg | Freq: Once | INTRAVENOUS | Status: AC
  Administered 2015-12-28: 4 mg via INTRAVENOUS
  Filled 2015-12-28: qty 1

## 2015-12-28 NOTE — ED Triage Notes (Addendum)
Patient reports that he was here last week for abdominal pain and dx with diverticulitis. The patient reports that still has the pain. Patient reports that he feels like this is his bladder. Reports that he is urinating very little.

## 2015-12-28 NOTE — ED Provider Notes (Signed)
Emergency Department Provider Note  By signing my name below, I, Nelwyn SalisburyJoshua Fowler, attest that this documentation has been prepared under the direction and in the presence of Maia PlanJoshua G Bruce Churilla, MD . Electronically Signed: Nelwyn SalisburyJoshua Fowler, Scribe. 12/28/2015. 9:51 PM.  I have reviewed the triage vital signs and the nursing notes.   HISTORY  Chief Complaint Abdominal Pain   HPI HPI Comments:  David Nelson is a 55 y.o. male with pmhx of pancreatitis, IBS, DM and diverticulitis who presents to the Emergency Department complaining of recurrent unchanged constant left lower abdominal pain beginning about 2 weeks ago. He describes his pain as a 10/10 exacerbated by urinating and deep inhalation. Pt states that he has had similar pain in the past and is typically indicative of either his pancreatitis or his IBS. He was seen recently (last week) for similar symptoms and was dx with diverticulitis. Pt returns today having had no relief of his symptoms.  He reports associated decreased urine. Pt denies any other symptoms.   Past Medical History:  Diagnosis Date  . CAD (coronary artery disease)   . Chronic abdominal pain   . Depression   . Diabetes mellitus without complication (HCC)   . GERD (gastroesophageal reflux disease)   . Hypertension   . IBS (irritable bowel syndrome)   . Pancreatitis   . Prostatitis   . PVD (peripheral vascular disease) [facility](HCC)     Patient Active Problem List   Diagnosis Date Noted  . Diabetes mellitus without complication (HCC)   . IBS (irritable bowel syndrome)   . CAD (coronary artery disease)   . Hypertension   . GERD (gastroesophageal reflux disease)   . Pancreatitis     History reviewed. No pertinent surgical history.  Current Outpatient Rx  . Order #: [id number] Class: Print  . Order #: [id number] Class: Print  . Order #: [id number] Class: Historical Med  . Order #: [id number] Class: Historical Med  . Order #: [id number] Class: Historical Med  . Order #:  [id number] Class: Print  . Order #: [id number] Class: Historical Med  . Order #: [id number] Class: Historical Med  . Order #: [id number] Class: Print  . Order #: [id number] Class: Historical Med  . Order #: [id number] Class: Print  . Order #: [id number] Class: Print  . Order #: [id number] Class: Print  . Order #: [id number] Class: Print  . Order #: [id number] Class: Print  . Order #: [id number] Class: Print  . Order #: [id number] Class: Historical Med  . Order #: [id number] Class: Historical Med    Allergies Ibuprofen  Family History  Problem Relation Age of Onset  . Family history unknown: Yes    Social History Social History  Substance Use Topics  . Smoking status: Current Every Day Smoker    Packs/day: 0.50    Types: Cigarettes  . Smokeless tobacco: Never Used  . Alcohol use No    Review of Systems  Constitutional: No fever/chills Eyes: No visual changes. ENT: No sore throat. Cardiovascular: Denies chest pain. Respiratory: Denies shortness of breath. Gastrointestinal: Positive for abdominal pain.  Positive nausea, no vomiting.  No diarrhea.  No constipation. Genitourinary: Positive for decreased urine. Negative for dysuria. Musculoskeletal: Negative for back pain. Skin: Negative for rash. Neurological: Negative for headaches, focal weakness or numbness.  10-point ROS otherwise negative.  ____________________________________________   PHYSICAL EXAM:  VITAL SIGNS: ED Triage Vitals  Enc Vitals Group     BP 12/28/15 2105 151/90     Pulse Rate 12/28/15 2105 88     Resp 12/28/15 2105 18     Temp 12/28/15 2105  98.1 F (36.7 C)     Temp Source 12/28/15 2105 Oral     SpO2 12/28/15 2105 100 %     Weight 12/28/15 2105 182 lb (82.6 kg)     Height 12/28/15 2105 5\' 6"  (1.676 m)     Pain Score 12/28/15 2117 10   Constitutional: Alert and oriented. Well appearing and in no acute distress. Eyes: Conjunctivae are normal.  Head: Atraumatic. Nose: No congestion/rhinnorhea. Mouth/Throat:  Mucous membranes are moist.  Oropharynx non-erythematous. Neck: No stridor.   Cardiovascular: Normal rate, regular rhythm. Good peripheral circulation. Grossly normal heart sounds.   Respiratory: Normal respiratory effort.  No retractions. Lungs CTAB. Gastrointestinal: Soft with positive LLQ tenderness. No rebound or guarding. No distention.  Musculoskeletal: No lower extremity tenderness nor edema. No gross deformities of extremities. Neurologic:  Normal speech and language. No gross focal neurologic deficits are appreciated.  Skin:  Skin is warm, dry and intact. No rash noted.  ____________________________________________   LABS (all labs ordered are listed, but only abnormal results are displayed)  Labs Reviewed  URINALYSIS, ROUTINE W REFLEX MICROSCOPIC - Abnormal; Notable for the following:       Result Value   APPearance CLOUDY (*)    Protein, ur 100 (*)    All other components within normal limits  CBC WITH DIFFERENTIAL/PLATELET - Abnormal; Notable for the following:    WBC 18.5 (*)    RDW 16.6 (*)    Lymphs Abs 8.7 (*)    Monocytes Absolute 1.1 (*)    Eosinophils Absolute 1.3 (*)    Basophils Absolute 0.2 (*)    All other components within normal limits  COMPREHENSIVE METABOLIC PANEL - Abnormal; Notable for the following:    Glucose, Bld 121 (*)    ALT 16 (*)    All other components within normal limits  URINALYSIS, MICROSCOPIC (REFLEX) - Abnormal; Notable for the following:    Squamous Epithelial / LPF 0-5 (*)    All other components within normal limits  LIPASE, BLOOD   ____________________________________________  RADIOLOGY  CT pending ____________________________________________   PROCEDURES  Procedure(s) performed:   Procedures  None ____________________________________________   INITIAL IMPRESSION / ASSESSMENT AND PLAN / ED COURSE  Pertinent labs & imaging results that were available during my care of the patient were reviewed by me and considered  in my medical decision making (see chart for details).  Patient returns emergency department for evaluation of abdominal discomfort. He has a history of pancreatitis, IBS, and diverticulitis. He was started on Cipro and Flagyl during recent emergency department visit but was only able to fill one of the prescriptions is been taking it irregularly. Abdomen is soft but tender to palpation left lower quadrant. White count is elevated but no other significant lab abnormalities. Plan for CT scan of the abdomen and pelvis to rule out diverticulitis with associated abscess or perforation.   Care transferred to Dr. Mora Bellman pending CT. Updated patient and prescribed small amount of hydrocodone for use at home.    ____________________________________________  FINAL CLINICAL IMPRESSION(S) / ED DIAGNOSES  Final diagnoses:  Chronic abdominal pain     MEDICATIONS GIVEN DURING THIS VISIT:  Medications  sodium chloride 0.9 % bolus 500 mL (0 mLs Intravenous Stopped 12/28/15 2337)  morphine 4 MG/ML injection 4 mg (4 mg Intravenous Given 12/28/15 2208)  iopamidol (ISOVUE-300) 61 % injection 100 mL (100 mLs Intravenous Contrast Given 12/29/15 0011)  morphine 4 MG/ML injection 4 mg (4 mg Intravenous Given 12/28/15 2342)  NEW OUTPATIENT MEDICATIONS STARTED DURING THIS VISIT:  Discharge Medication List as of 12/29/2015  1:32 AM    START taking these medications   Details  HYDROcodone-acetaminophen (NORCO/VICODIN) 5-325 MG tablet Take 1 tablet by mouth every 4 (four) hours as needed., Starting Wed 12/29/2015, Print        Note:  This document was prepared using Dragon voice recognition software and may include unintentional dictation errors.  Alona BeneJoshua Phebe Dettmer, MD Emergency Medicine  I personally performed the services described in this documentation, which was scribed in my presence. The recorded information has been reviewed and is accurate.       Maia PlanJoshua G Valeree Leidy, MD 12/29/15 1005

## 2015-12-29 MED ORDER — HYDROCODONE-ACETAMINOPHEN 5-325 MG PO TABS: 1.0000 | ORAL_TABLET | ORAL | 0 refills | Status: DC | PRN

## 2015-12-29 MED ORDER — DIPHENHYDRAMINE HCL 50 MG/ML IJ SOLN
INTRAMUSCULAR | Status: AC
  Filled 2015-12-29: qty 1

## 2015-12-29 NOTE — ED Provider Notes (Signed)
1:30 AM I was signed out patient as pending CT of abd pelvis.  It does not show any acute pathology.  Dr. Jacqulyn BathLong prepared pain medication Rx for the patient to go home with which was given to him.  He was informed of CT results.  Abdominal exam is not concerning for me.  He appears well and in NAD.  VS remain within his normal limits and he is safe for DC.   Tomasita CrumbleAdeleke Tilia Faso, MD 12/29/15 [phone]

## 2016-02-07 ENCOUNTER — Emergency Department (HOSPITAL_BASED_OUTPATIENT_CLINIC_OR_DEPARTMENT_OTHER): Payer: Self-pay

## 2016-02-07 ENCOUNTER — Emergency Department (HOSPITAL_BASED_OUTPATIENT_CLINIC_OR_DEPARTMENT_OTHER)
Admission: EM | Admit: 2016-02-07 | Discharge: 2016-02-07 | Disposition: A | Discharge status: Home / Self Care | Payer: Self-pay | Attending: Emergency Medicine | Admitting: Emergency Medicine

## 2016-02-07 ENCOUNTER — Encounter (HOSPITAL_BASED_OUTPATIENT_CLINIC_OR_DEPARTMENT_OTHER): Payer: Self-pay | Admitting: *Deleted

## 2016-02-07 DIAGNOSIS — K5900 Constipation, unspecified: Secondary | ICD-10-CM | POA: Insufficient documentation

## 2016-02-07 DIAGNOSIS — R300 Dysuria: Secondary | ICD-10-CM | POA: Insufficient documentation

## 2016-02-07 DIAGNOSIS — R110 Nausea: Secondary | ICD-10-CM | POA: Insufficient documentation

## 2016-02-07 DIAGNOSIS — F17210 Nicotine dependence, cigarettes, uncomplicated: Secondary | ICD-10-CM | POA: Insufficient documentation

## 2016-02-07 DIAGNOSIS — E119 Type 2 diabetes mellitus without complications: Secondary | ICD-10-CM | POA: Insufficient documentation

## 2016-02-07 DIAGNOSIS — R109 Unspecified abdominal pain: Secondary | ICD-10-CM

## 2016-02-07 DIAGNOSIS — I10 Essential (primary) hypertension: Secondary | ICD-10-CM | POA: Insufficient documentation

## 2016-02-07 DIAGNOSIS — Z7984 Long term (current) use of oral hypoglycemic drugs: Secondary | ICD-10-CM | POA: Insufficient documentation

## 2016-02-07 DIAGNOSIS — I2510 Atherosclerotic heart disease of native coronary artery without angina pectoris: Secondary | ICD-10-CM | POA: Insufficient documentation

## 2016-02-07 DIAGNOSIS — R079 Chest pain, unspecified: Secondary | ICD-10-CM | POA: Insufficient documentation

## 2016-02-07 DIAGNOSIS — G8929 Other chronic pain: Secondary | ICD-10-CM

## 2016-02-07 LAB — CBC WITH DIFFERENTIAL/PLATELET
Basophils Absolute: 0.1 10*3/uL (ref 0.0–0.1)
Basophils Relative: 1 %
Eosinophils Absolute: 0.5 10*3/uL (ref 0.0–0.7)
Eosinophils Relative: 4 %
HCT: 38.3 % — ABNORMAL LOW (ref 39.0–52.0)
Hemoglobin: 12.6 g/dL — ABNORMAL LOW (ref 13.0–17.0)
Lymphocytes Relative: 50 %
Lymphs Abs: 6.7 10*3/uL — ABNORMAL HIGH (ref 0.7–4.0)
MCH: 27.5 pg (ref 26.0–34.0)
MCHC: 32.9 g/dL (ref 30.0–36.0)
MCV: 83.6 fL (ref 78.0–100.0)
Monocytes Absolute: 0.9 10*3/uL (ref 0.1–1.0)
Monocytes Relative: 7 %
Neutro Abs: 5.1 10*3/uL (ref 1.7–7.7)
Neutrophils Relative %: 38 %
Platelets: 327 10*3/uL (ref 150–400)
RBC: 4.58 MIL/uL (ref 4.22–5.81)
RDW: 16.3 % — ABNORMAL HIGH (ref 11.5–15.5)
WBC: 13.3 10*3/uL — ABNORMAL HIGH (ref 4.0–10.5)

## 2016-02-07 LAB — COMPREHENSIVE METABOLIC PANEL
ALT: 15 U/L — ABNORMAL LOW (ref 17–63)
AST: 15 U/L (ref 15–41)
Albumin: 4.3 g/dL (ref 3.5–5.0)
Alkaline Phosphatase: 53 U/L (ref 38–126)
Anion gap: 6 (ref 5–15)
BUN: 11 mg/dL (ref 6–20)
CO2: 27 mmol/L (ref 22–32)
Calcium: 9 mg/dL (ref 8.9–10.3)
Chloride: 105 mmol/L (ref 101–111)
Creatinine, Ser: 0.86 mg/dL (ref 0.61–1.24)
GFR calc Af Amer: >60 mL/min (ref >60)
GFR calc non Af Amer: >60 mL/min (ref >60)
Glucose, Bld: 113 mg/dL — ABNORMAL HIGH (ref 65–99)
Potassium: 4 mmol/L (ref 3.5–5.1)
Sodium: 138 mmol/L (ref 135–145)
Total Bilirubin: 0.4 mg/dL (ref 0.3–1.2)
Total Protein: 7.3 g/dL (ref 6.5–8.1)

## 2016-02-07 LAB — URINALYSIS, ROUTINE W REFLEX MICROSCOPIC
BILIRUBIN URINE: NEGATIVE
Glucose, UA: NEGATIVE mg/dL
HGB URINE DIPSTICK: NEGATIVE
Ketones, ur: NEGATIVE mg/dL
Leukocytes, UA: NEGATIVE
NITRITE: NEGATIVE
PH: 6.5 (ref 5.0–8.0)
Protein, ur: NEGATIVE mg/dL
SPECIFIC GRAVITY, URINE: 1.005 (ref 1.005–1.030)

## 2016-02-07 LAB — TROPONIN I: Troponin I: <0.03 ng/mL (ref <0.03)

## 2016-02-07 LAB — LIPASE, BLOOD: Lipase: 25 U/L (ref 11–51)

## 2016-02-07 MED ORDER — ONDANSETRON HCL 4 MG/2ML IJ SOLN
4.0000 mg | Freq: Once | INTRAMUSCULAR | Status: AC
  Administered 2016-02-07: 4 mg via INTRAVENOUS
  Filled 2016-02-07: qty 2

## 2016-02-07 MED ORDER — HYDROMORPHONE HCL 1 MG/ML IJ SOLN
1.0000 mg | Freq: Once | INTRAMUSCULAR | Status: AC
  Administered 2016-02-07: 1 mg via INTRAVENOUS
  Filled 2016-02-07: qty 1

## 2016-02-07 MED ORDER — MORPHINE SULFATE (PF) 4 MG/ML IV SOLN
4.0000 mg | Freq: Once | INTRAVENOUS | Status: AC
  Administered 2016-02-07: 4 mg via INTRAVENOUS
  Filled 2016-02-07: qty 1

## 2016-02-07 MED ORDER — DOCUSATE SODIUM 100 MG PO CAPS: 100.0000 mg | ORAL_CAPSULE | Freq: Two times a day (BID) | ORAL | 0 refills | Status: DC

## 2016-02-07 MED ORDER — POLYETHYLENE GLYCOL 3350 17 G PO PACK: 17.0000 g | PACK | Freq: Every day | ORAL | 0 refills | Status: DC

## 2016-02-07 MED ORDER — IOPAMIDOL (ISOVUE-300) INJECTION 61%
100.0000 mL | Freq: Once | INTRAVENOUS | Status: AC | PRN
  Administered 2016-02-07: 100 mL via INTRAVENOUS

## 2016-02-07 MED ORDER — ACETAMINOPHEN 500 MG PO TABS: 1000.0000 mg | ORAL_TABLET | Freq: Three times a day (TID) | ORAL | 0 refills | Status: DC | PRN

## 2016-02-07 NOTE — ED Notes (Signed)
Informed PA about Pt. Wanting pain meds.  RN encouraged Pt. About going to CT soon.

## 2016-02-07 NOTE — ED Notes (Signed)
Patient transported to CT 

## 2016-02-07 NOTE — ED Triage Notes (Signed)
Pt reports intermittent abdominal pain x 3-4 weeks.  Worsening over the last few days.  Pt given flomax at urologist and reports that it is not helping with urination.  Ambulatory.  No acute distress noted.

## 2016-02-07 NOTE — ED Provider Notes (Signed)
[facility] Provider Note   CSN: [id number] Arrival date & time: 02/07/16  1516  By signing my name below, I, David Nelson, attest that this documentation has been prepared under the direction and in the presence of [name], PA-C. Electronically Signed: [name] , ED Scribe. 02/07/16. 4:20 PM.   History   Chief Complaint Chief Complaint  Patient presents with  . Abdominal Pain    HPI  David Nelson is a 56 y.o. male with hx of diabetes mellitus, pancreatitis, IBS and constipation diverticulitis who presents to the Emergency Department complaining of moderate to severe abdominal pain with sudden onset x2 days. Pt states that x2 days ago he developed a constantly worsening, sharp abdominal pain. He states that he has had intermittent abdominal pain since this summer that never cleared up. He was seen by a urologist and prescribed Flomax which he states provided mild relief. Pt states that he taken Dicyclomine and Nexium for IBS. Pt reports abdominal pain, dysuria (x1 month), chest pain radiating to L arm with position changes (x2 days), constipation and nausea. Patient denies chest pain today. Pt states that he taken hot showers, lied on heating pads and has taken Zofran with mild relief of the nausea. Pt denies fever, vomiting. Pt denies hx of heart problems, stroke, family hx of heart disease. Pt is a smoker. Of note, pt states "I have clogged arteries all through my body."  The history is provided by the patient. No language interpreter was used.    Past Medical History:  Diagnosis Date  . CAD (coronary artery disease)   . Chronic abdominal pain   . Depression   . Diabetes mellitus without complication (HCC)   . GERD (gastroesophageal reflux disease)   . Hypertension   . IBS (irritable bowel syndrome)   . Pancreatitis   . Prostatitis   . PVD (peripheral vascular disease) [facility])     Patient Active Problem List   Diagnosis Date Noted  . Diabetes  mellitus without complication (HCC)   . IBS (irritable bowel syndrome)   . CAD (coronary artery disease)   . Hypertension   . GERD (gastroesophageal reflux disease)   . Pancreatitis     History reviewed. No pertinent surgical history.     Home Medications    Prior to Admission medications   Medication Sig Start Date End Date Taking? Authorizing Provider  acetaminophen (TYLENOL) 500 MG tablet Take 2 tablets (1,000 mg total) by mouth every 8 (eight) hours as needed. 02/07/16   [name], PA-C  dicyclomine (BENTYL) 10 MG capsule Take 10 mg by mouth 4 (four) times daily -  before meals and at bedtime.    Historical Provider, MD  docusate sodium (COLACE) 100 MG capsule Take 1 capsule (100 mg total) by mouth every 12 (twelve) hours. 02/07/16   [name], PA-C  DULoxetine (CYMBALTA) 30 MG capsule Take 30 mg by mouth 2 (two) times daily.    Historical Provider, MD  gabapentin (NEURONTIN) 300 MG capsule Take 300 mg by mouth at bedtime.    Historical Provider, MD  lisinopril (PRINIVIL,ZESTRIL) 10 MG tablet Take 10 mg by mouth daily.    Historical Provider, MD  metFORMIN (GLUCOPHAGE) 1000 MG tablet Take 1,000 mg by mouth 2 (two) times daily with a meal.    Historical Provider, MD  omeprazole (PRILOSEC) 10 MG capsule Take 10 mg by mouth daily.    Historical Provider, MD  polyethylene glycol (MIRALAX) packet Take 17 g by mouth daily.  You can take up to 4 days in a row to produce bowel movement. 02/07/16   [name], PA-C  rosuvastatin (CRESTOR) 10 MG tablet Take 10 mg by mouth daily.    Historical Provider, MD    Family History Family History  Problem Relation Age of Onset  . Family history unknown: Yes    Social History Social History  Substance Use Topics  . Smoking status: Current Every Day Smoker    Packs/day: 0.50    Types: Cigarettes  . Smokeless tobacco: Never Used  . Alcohol use No     Allergies   Ibuprofen   Review of Systems Review of Systems    Constitutional: Negative for chills and fever.  HENT: Negative for facial swelling and sore throat.   Respiratory: Negative for shortness of breath.   Cardiovascular: Positive for chest pain.  Gastrointestinal: Positive for abdominal pain, constipation and nausea. Negative for vomiting.  Genitourinary: Positive for dysuria.  Musculoskeletal: Negative for back pain.  Skin: Negative for rash and wound.  Neurological: Negative for headaches.  Psychiatric/Behavioral: The patient is not nervous/anxious.      Physical Exam Updated Vital Signs BP 121/78   Pulse 77   Temp 98.2 F (36.8 C) (Oral)   Resp 12   Ht 5\' 6"  (1.676 m)   Wt 81.6 kg   SpO2 97%   BMI 29.05 kg/m   Physical Exam  Constitutional: He appears well-developed and well-nourished. No distress.  HENT:  Head: Normocephalic and atraumatic.  Mouth/Throat: Oropharynx is clear and moist. No oropharyngeal exudate.  Eyes: Conjunctivae are normal. Pupils are equal, round, and reactive to light. Right eye exhibits no discharge. Left eye exhibits no discharge. No scleral icterus.  Neck: Normal range of motion. Neck supple. No thyromegaly present.  Cardiovascular: Normal rate, regular rhythm, normal heart sounds and intact distal pulses.  Exam reveals no gallop and no friction rub.   No murmur heard. Pulmonary/Chest: Effort normal and breath sounds normal. No stridor. No respiratory distress. He has no wheezes. He has no rales.  Abdominal: Soft. Bowel sounds are normal. He exhibits no distension. There is tenderness in the right lower quadrant, suprapubic area and left lower quadrant. There is no rebound and no guarding.  RLQ, suprapubilc, LLQ and L flank tenderness  Musculoskeletal: He exhibits no edema.  Lymphadenopathy:    He has no cervical adenopathy.  Neurological: He is alert. Coordination normal.  Skin: Skin is warm and dry. No rash noted. He is not diaphoretic. No pallor.  Psychiatric: He has a normal mood and affect.   Nursing note and vitals reviewed.    ED Treatments / Results   DIAGNOSTIC STUDIES: Oxygen Saturation is 100% on RA, normal by my interpretation.   COORDINATION OF CARE: 12:57 PM-Discussed next steps with pt. Pt verbalized understanding and is agreeable with the plan.    Labs (all labs ordered are listed, but only abnormal results are displayed) Labs Reviewed  COMPREHENSIVE METABOLIC PANEL - Abnormal; Notable for the following:       Result Value   Glucose, Bld 113 (*)    ALT 15 (*)    All other components within normal limits  CBC WITH DIFFERENTIAL/PLATELET - Abnormal; Notable for the following:    WBC 13.3 (*)    Hemoglobin 12.6 (*)    HCT 38.3 (*)    RDW 16.3 (*)    Lymphs Abs 6.7 (*)    All other components within normal limits  URINALYSIS, ROUTINE W REFLEX MICROSCOPIC  TROPONIN I  LIPASE, BLOOD  PATHOLOGIST SMEAR REVIEW    EKG  EKG Interpretation  Date/Time:  Monday February 07 2016 17:47:27 EST Ventricular Rate:  82 PR Interval:    QRS Duration: 82 QT Interval:  377 QTC Calculation: 441 R Axis:   -14 Text Interpretation:  Sinus rhythm Low voltage, precordial leads Abnormal R-wave progression, early transition Nonspecific T abnormalities, lateral leads No significant change since last tracing Confirmed by [facility] MD, PEDRO ([id number]) on 02/07/2016 6:24:17 PM Also confirmed by [facility] MD, PEDRO [phone]), editor [place]  CCT, BEVERLY ([id number])  on 02/08/2016 6:55:09 AM       Radiology Ct Abdomen Pelvis W Contrast  Result Date: 02/07/2016 CLINICAL DATA:  Left lower quadrant pain, left flank pain and right lower quadrant pain for 2 days. Abdominal pain and dysuria for 1 month. Constipation and nausea. History of diabetes, pancreatitis, IBS, diverticulitis, GERD, hypertension. EXAM: CT ABDOMEN AND PELVIS WITH CONTRAST TECHNIQUE: Multidetector CT imaging of the abdomen and pelvis was performed using the standard protocol following bolus administration of intravenous  contrast. CONTRAST:  ISOVUE-300 IOPAMIDOL (ISOVUE-300) INJECTION 61% COMPARISON:  CT abdomen dated 12/29/2015. FINDINGS: Lower chest: No acute abnormality. Hepatobiliary: No focal liver abnormality is seen. No gallstones, gallbladder wall thickening, or biliary dilatation. Pancreas: Mild parenchymal atrophy.  Otherwise unremarkable. Spleen: Normal in size without focal abnormality. Adrenals/Urinary Tract: Adrenal glands appear normal. Kidneys appear normal without mass, stone or hydronephrosis. No ureteral or bladder calculi identified. Bladder is unremarkable. Stomach/Bowel: Bowel is normal in caliber. No bowel wall thickening or evidence of bowel wall inflammation. Moderate amount of stool throughout the nondistended colon. Appendix is normal. Stomach appears normal. Vascular/Lymphatic: Aortic atherosclerosis. No acute appearing vascular abnormality. No enlarged lymph nodes seen. Reproductive: Unremarkable. Other: No free fluid or abscess collection. No free intraperitoneal air. Musculoskeletal: No acute or suspicious osseous finding. Superficial soft tissues are unremarkable. IMPRESSION: 1. No acute findings within the abdomen or pelvis. No bowel obstruction or evidence of bowel wall inflammation. No evidence of acute solid organ abnormality. No renal or ureteral calculi. No free fluid. Appendix is normal. 2. Moderate amount of stool in the transverse colon and right colon (constipation? ). 3. Aortic atherosclerosis. Electronically Signed   By: Bary Richard M.D.   On: 02/07/2016 20:40    Procedures Procedures (including critical care time)  Medications Ordered in ED Medications  morphine 4 MG/ML injection 4 mg (4 mg Intravenous Given 02/07/16 1745)  ondansetron (ZOFRAN) injection 4 mg (4 mg Intravenous Given 02/07/16 1745)  iopamidol (ISOVUE-300) 61 % injection 100 mL (100 mLs Intravenous Contrast Given 02/07/16 2022)  HYDROmorphone (DILAUDID) injection 1 mg (1 mg Intravenous Given 02/07/16 2119)      Initial Impression / Assessment and Plan / ED Course  I have reviewed the triage vital signs and the nursing notes.  Pertinent labs & imaging results that were available during my care of the patient were reviewed by me and considered in my medical decision making (see chart for details).  Clinical Course     CBC shows WBC 13.3, which is decreased from last visit in the past month, hemoglobin 12.6. CMP, lipase WNL. Troponin negative. UA negative. EKG shows NSR, nonspecific T wave abnormality, but no significant change since last tracing. CT abdomen pelvis shows no acute findings, but moderate stool burden. Will treat patient's constipation with Miralax and colace. D/c with tylenol for pain. Follow up to GI, urology, and PCP. Return precautions discussed. Patient understands and agrees with plan. Patient vitals  stable throughout ED course and discharged in satisfactory condition. I discussed patient case with Dr. Eudelia Bunchardama who guided the patient's management and agrees with plan.   Final Clinical Impressions(s) / ED Diagnoses   Final diagnoses:  Chronic abdominal pain    New Prescriptions Discharge Medication List as of 02/07/2016  9:12 PM    START taking these medications   Details  acetaminophen (TYLENOL) 500 MG tablet Take 2 tablets (1,000 mg total) by mouth every 8 (eight) hours as needed., Starting Mon 02/07/2016, Print    docusate sodium (COLACE) 100 MG capsule Take 1 capsule (100 mg total) by mouth every 12 (twelve) hours., Starting Mon 02/07/2016, Print    polyethylene glycol (MIRALAX) packet Take 17 g by mouth daily. You can take up to 4 days in a row to produce bowel movement., Starting Mon 02/07/2016, Print       I personally performed the services described in this documentation, which was scribed in my presence. The recorded information has been reviewed and is accurate.     Emi Holeslexandra M Kassidee Narciso, PA-C 02/09/16 1300    Nira ConnPedro Eduardo Cardama, MD 02/11/16 1535

## 2016-02-07 NOTE — Discharge Instructions (Signed)
Medications: Tylenol, MiraLAX, Colace  Treatment: Take Tylenol every 8 hours as prescribed for pain. Take MiraLAX once daily up to 4 days in a row to produce bowel movement. Take Colace as prescribed to soften your stools.  Follow-up: Please follow up with your primary care provider as soon as possible for further evaluation and treatment of her symptoms. Please follow-up with your urologist and gastroenterologist as well. Please return to the emergency department if you develop any new or worsening symptoms.

## 2016-02-08 LAB — PATHOLOGIST SMEAR REVIEW

## 2016-03-04 ENCOUNTER — Emergency Department (HOSPITAL_COMMUNITY)
Admission: EM | Admit: 2016-03-04 | Discharge: 2016-03-05 | Disposition: A | Discharge status: Home / Self Care | Payer: Self-pay | Attending: Emergency Medicine | Admitting: Emergency Medicine

## 2016-03-04 ENCOUNTER — Encounter (HOSPITAL_COMMUNITY): Payer: Self-pay | Admitting: Emergency Medicine

## 2016-03-04 DIAGNOSIS — I10 Essential (primary) hypertension: Secondary | ICD-10-CM | POA: Insufficient documentation

## 2016-03-04 DIAGNOSIS — I2510 Atherosclerotic heart disease of native coronary artery without angina pectoris: Secondary | ICD-10-CM | POA: Insufficient documentation

## 2016-03-04 DIAGNOSIS — G8929 Other chronic pain: Secondary | ICD-10-CM | POA: Insufficient documentation

## 2016-03-04 DIAGNOSIS — M542 Cervicalgia: Secondary | ICD-10-CM | POA: Insufficient documentation

## 2016-03-04 DIAGNOSIS — E119 Type 2 diabetes mellitus without complications: Secondary | ICD-10-CM | POA: Insufficient documentation

## 2016-03-04 DIAGNOSIS — Z79899 Other long term (current) drug therapy: Secondary | ICD-10-CM | POA: Insufficient documentation

## 2016-03-04 DIAGNOSIS — F17210 Nicotine dependence, cigarettes, uncomplicated: Secondary | ICD-10-CM | POA: Insufficient documentation

## 2016-03-04 LAB — URINALYSIS, ROUTINE W REFLEX MICROSCOPIC
Bacteria, UA: NONE SEEN
Glucose, UA: 50 mg/dL — AB
Hgb urine dipstick: NEGATIVE
Ketones, ur: 5 mg/dL — AB
Nitrite: NEGATIVE
Protein, ur: 30 mg/dL — AB
Specific Gravity, Urine: 1.032 — ABNORMAL HIGH (ref 1.005–1.030)
pH: 5 (ref 5.0–8.0)

## 2016-03-04 LAB — CBG MONITORING, ED: Glucose-Capillary: 140 mg/dL — ABNORMAL HIGH (ref 65–99)

## 2016-03-04 MED ORDER — CYCLOBENZAPRINE HCL 10 MG PO TABS
10.0000 mg | ORAL_TABLET | Freq: Once | ORAL | Status: AC
  Administered 2016-03-05: 10 mg via ORAL
  Filled 2016-03-04: qty 1

## 2016-03-04 MED ORDER — DICYCLOMINE HCL 10 MG PO CAPS
10.0000 mg | ORAL_CAPSULE | Freq: Once | ORAL | Status: AC
  Administered 2016-03-05: 10 mg via ORAL
  Filled 2016-03-04: qty 1

## 2016-03-04 MED ORDER — POLYETHYLENE GLYCOL 3350 17 G PO PACK
17.0000 g | PACK | Freq: Once | ORAL | Status: DC
  Filled 2016-03-04: qty 1

## 2016-03-04 MED ORDER — KETOROLAC TROMETHAMINE 60 MG/2ML IM SOLN
30.0000 mg | Freq: Once | INTRAMUSCULAR | Status: AC
  Administered 2016-03-05: 30 mg via INTRAMUSCULAR
  Filled 2016-03-04: qty 2

## 2016-03-04 MED ORDER — PANTOPRAZOLE SODIUM 40 MG PO TBEC
40.0000 mg | DELAYED_RELEASE_TABLET | Freq: Once | ORAL | Status: AC
  Administered 2016-03-05: 40 mg via ORAL
  Filled 2016-03-04: qty 1

## 2016-03-04 MED ORDER — GABAPENTIN 300 MG PO CAPS
300.0000 mg | ORAL_CAPSULE | Freq: Once | ORAL | Status: AC
  Administered 2016-03-05: 300 mg via ORAL
  Filled 2016-03-04: qty 1

## 2016-03-04 NOTE — ED Notes (Signed)
Pt states that he has moved here in [place] since last month from [place].  States that he does not have a doctor but was seeing someone in [place].  Chronic pain all over per pt including neuropathy to feet due to DM.  Has been out of a lot medication for awhile-about 3 weeks per pt.  Pt states that there was an issue with sending medications to new address and pt states that they are to be sent to new address currently.

## 2016-03-04 NOTE — ED Triage Notes (Signed)
Pt states that he has not had his pain medication for about 3 weeks, having pain from neck to feet.

## 2016-03-05 NOTE — ED Provider Notes (Addendum)
[facility]PT Provider Note   CSN: [id number] Arrival date & time: 03/04/16  2120     History   Chief Complaint Chief Complaint  Patient presents with  . Generalized Body Aches    HPI David Nelson is a 56 y.o. male with history of chronic abdominal pain, chronic neck pain, chronic diabetic nerve pain, pancreatitis, IBS, PVD who presents with chronic pain due to being out of his maintenance medications for 3 weeks. Patient reports he recently moved and his mail and medications were mailed to his old address. Patient states his medications should be on the way. He states he is having pain from his neck to his toes, specifically his neck, chronic abdominal pain, and bilateral calves and feet due to his PVD and diabetic nerve pain. Patient states he is out of gabapentin, Robaxin, Bentyl, Prilosec, Colace. Patient states he has not had a bowel movement in 4 days. Patient states that if I was to refill his medications, he would not be able to afford them. He requests pain medication to get him through until his other medications come. Patient denies any chest pain, shortness of breath, nausea, vomiting, diarrhea, urinary symptoms.  HPI  Past Medical History:  Diagnosis Date  . CAD (coronary artery disease)   . Chronic abdominal pain   . Depression   . Diabetes mellitus without complication (HCC)   . GERD (gastroesophageal reflux disease)   . Hypertension   . IBS (irritable bowel syndrome)   . Pancreatitis   . Prostatitis   . PVD (peripheral vascular disease) [facility])     Patient Active Problem List   Diagnosis Date Noted  . Diabetes mellitus without complication (HCC)   . IBS (irritable bowel syndrome)   . CAD (coronary artery disease)   . Hypertension   . GERD (gastroesophageal reflux disease)   . Pancreatitis     History reviewed. No pertinent surgical history.     Home Medications    Prior to Admission medications   Medication Sig Start Date End Date Taking?  Authorizing Provider  acetaminophen (TYLENOL) 500 MG tablet Take 2 tablets (1,000 mg total) by mouth every 8 (eight) hours as needed. 02/07/16  Yes Rogue Pautler M Araina Butrick, PA-C  dicyclomine (BENTYL) 10 MG capsule Take 10 mg by mouth 4 (four) times daily -  before meals and at bedtime.   Yes Historical Provider, MD  docusate sodium (COLACE) 100 MG capsule Take 1 capsule (100 mg total) by mouth every 12 (twelve) hours. 02/07/16  Yes Mady Oubre M Naasir Carreira, PA-C  DULoxetine (CYMBALTA) 30 MG capsule Take 30 mg by mouth 2 (two) times daily.   Yes Historical Provider, MD  gabapentin (NEURONTIN) 300 MG capsule Take 300 mg by mouth at bedtime.   Yes Historical Provider, MD  lisinopril (PRINIVIL,ZESTRIL) 10 MG tablet Take 10 mg by mouth daily.   Yes Historical Provider, MD  metFORMIN (GLUCOPHAGE) 1000 MG tablet Take 1,000 mg by mouth 2 (two) times daily with a meal.   Yes Historical Provider, MD  omeprazole (PRILOSEC) 10 MG capsule Take 10 mg by mouth daily.   Yes Historical Provider, MD  polyethylene glycol (MIRALAX) packet Take 17 g by mouth daily. You can take up to 4 days in a row to produce bowel movement. 02/07/16  Yes Darnel Mchan M Joanie Duprey, PA-C  rosuvastatin (CRESTOR) 10 MG tablet Take 10 mg by mouth daily.   Yes Historical Provider, MD  tamsulosin (FLOMAX) 0.4 MG CAPS capsule Take 0.4 mg by mouth daily. 01/12/16  Yes Historical  Provider, MD    Family History Family History  Problem Relation Age of Onset  . Family history unknown: Yes    Social History Social History  Substance Use Topics  . Smoking status: Current Every Day Smoker    Packs/day: 0.50    Types: Cigarettes  . Smokeless tobacco: Never Used  . Alcohol use No     Allergies   Ibuprofen   Review of Systems Review of Systems   Physical Exam Updated Vital Signs BP 153/76 (BP Location: Left Arm)   Pulse 88   Temp 97.7 F (36.5 C) (Oral)   Resp 18   Ht 5\' 6"  (1.676 m)   Wt 81.6 kg   SpO2 100%   BMI 29.05 kg/m   Physical Exam    Constitutional: He appears well-developed and well-nourished. No distress.  HENT:  Head: Normocephalic and atraumatic.  Mouth/Throat: Oropharynx is clear and moist. No oropharyngeal exudate.  Eyes: Conjunctivae are normal. Pupils are equal, round, and reactive to light. Right eye exhibits no discharge. Left eye exhibits no discharge. No scleral icterus.  Neck: Normal range of motion. Neck supple. No thyromegaly present.  Cervical tenderness (chronic)  Cardiovascular: Normal rate, regular rhythm, normal heart sounds and intact distal pulses.  Exam reveals no gallop and no friction rub.   No murmur heard. Pulmonary/Chest: Effort normal and breath sounds normal. No stridor. No respiratory distress. He has no wheezes. He has no rales.  Abdominal: Soft. Bowel sounds are normal. He exhibits no distension. There is generalized tenderness. There is no rebound and no guarding.  Musculoskeletal: He exhibits no edema.  Bilateral calf tenderness (feels same as chronic)  Lymphadenopathy:    He has no cervical adenopathy.  Neurological: He is alert. Coordination normal.  Skin: Skin is warm and dry. No rash noted. He is not diaphoretic. No pallor.  Psychiatric: He has a normal mood and affect.  Nursing note and vitals reviewed.    ED Treatments / Results  Labs (all labs ordered are listed, but only abnormal results are displayed) Labs Reviewed  URINALYSIS, ROUTINE W REFLEX MICROSCOPIC - Abnormal; Notable for the following:       Result Value   Color, Urine AMBER (*)    APPearance HAZY (*)    Specific Gravity, Urine 1.032 (*)    Glucose, UA 50 (*)    Bilirubin Urine MODERATE (*)    Ketones, ur 5 (*)    Protein, ur 30 (*)    Leukocytes, UA TRACE (*)    All other components within normal limits  CBG MONITORING, ED - Abnormal; Notable for the following:    Glucose-Capillary 140 (*)    All other components within normal limits    EKG  EKG Interpretation None       Radiology No results  found.  Procedures Procedures (including critical care time)  Medications Ordered in ED Medications  polyethylene glycol (MIRALAX / GLYCOLAX) packet 17 g (17 g Oral Given 03/05/16 0003)  gabapentin (NEURONTIN) capsule 300 mg (300 mg Oral Given 03/05/16 0003)  dicyclomine (BENTYL) capsule 10 mg (10 mg Oral Given 03/05/16 0003)  pantoprazole (PROTONIX) EC tablet 40 mg (40 mg Oral Given 03/05/16 0003)  cyclobenzaprine (FLEXERIL) tablet 10 mg (10 mg Oral Given 03/05/16 0003)  ketorolac (TORADOL) injection 30 mg (30 mg Intramuscular Given 03/05/16 0003)     Initial Impression / Assessment and Plan / ED Course  I have reviewed the triage vital signs and the nursing notes.  Pertinent labs & imaging  results that were available during my care of the patient were reviewed by me and considered in my medical decision making (see chart for details).     Patient with many chronic conditions and pain. Out of many medications, however patient states he would not be able to afford any prescriptions, as he has no income. He does have all of his medications on the way in the mail. He hopes to have them in 2 days. Patient given a dose of his gabapentin, Bentyl, as well as Protonix in place of Prilosec and Flexeril in place Robaxin in ED. Patient also given a Toradol IM injection. Patient's pain much improved in the ED. UA shows moderate bilirubin, 5 ketones, 30 protein, trace leukocytes. Patient states he has not been drinking very much water because he has been in a lot of pain and hasn't been walking very much the kitchen. Patient advised to follow up and establish care with a new primary care provider as well as follow up with his GI doctor and urologist. I advised to have his urine rechecked at one of the above follow-ups. Return precautions discussed. Patient understands and agrees with plan. Patient vitals stable throughout ED course and discharged in satisfactory condition. I discussed patient case with Dr.  Deretha EmoryZackowski who guided the patient's management and agrees with plan.  Final Clinical Impressions(s) / ED Diagnoses   Final diagnoses:  Other chronic pain    New Prescriptions New Prescriptions   No medications on file     Verdis Primelexandra M Zahmir Lalla, PA-C 03/05/16 0036    Vanetta MuldersScott Zackowski, MD 03/06/16 0021    Waylan BogaAlexandra M Julyanna Scholle, PA-C 03/17/16 [id number]    Vanetta MuldersScott Zackowski, MD 03/21/16 1719

## 2016-03-05 NOTE — Discharge Instructions (Signed)
Please follow up with your primary care provider, GI doctor, and urologist for further evaluation of your symptoms and chronic pain. Please have your primary care doctor or urologist recheck your urine at your next visit. Please return to the emergency department if you develop any new or worsening symptoms.

## 2016-05-19 ENCOUNTER — Encounter (HOSPITAL_COMMUNITY): Payer: Self-pay

## 2016-05-19 ENCOUNTER — Emergency Department (HOSPITAL_COMMUNITY): Payer: Self-pay

## 2016-05-19 ENCOUNTER — Emergency Department (HOSPITAL_COMMUNITY)
Admission: EM | Admit: 2016-05-19 | Discharge: 2016-05-20 | Disposition: A | Discharge status: Home / Self Care | Payer: Self-pay | Attending: Emergency Medicine | Admitting: Emergency Medicine

## 2016-05-19 DIAGNOSIS — Z7984 Long term (current) use of oral hypoglycemic drugs: Secondary | ICD-10-CM | POA: Insufficient documentation

## 2016-05-19 DIAGNOSIS — I10 Essential (primary) hypertension: Secondary | ICD-10-CM | POA: Insufficient documentation

## 2016-05-19 DIAGNOSIS — I2510 Atherosclerotic heart disease of native coronary artery without angina pectoris: Secondary | ICD-10-CM | POA: Insufficient documentation

## 2016-05-19 DIAGNOSIS — R531 Weakness: Secondary | ICD-10-CM | POA: Insufficient documentation

## 2016-05-19 DIAGNOSIS — Z79899 Other long term (current) drug therapy: Secondary | ICD-10-CM | POA: Insufficient documentation

## 2016-05-19 DIAGNOSIS — E119 Type 2 diabetes mellitus without complications: Secondary | ICD-10-CM | POA: Insufficient documentation

## 2016-05-19 DIAGNOSIS — R0602 Shortness of breath: Secondary | ICD-10-CM | POA: Insufficient documentation

## 2016-05-19 DIAGNOSIS — R0789 Other chest pain: Secondary | ICD-10-CM | POA: Insufficient documentation

## 2016-05-19 DIAGNOSIS — F17210 Nicotine dependence, cigarettes, uncomplicated: Secondary | ICD-10-CM | POA: Insufficient documentation

## 2016-05-19 LAB — CBC WITH DIFFERENTIAL/PLATELET
BLASTS: 0 %
Band Neutrophils: 0 %
Basophils Absolute: 0 10*3/uL (ref 0.0–0.1)
Basophils Relative: 0 %
EOS ABS: 1.7 10*3/uL — AB (ref 0.0–0.7)
EOS PCT: 11 %
HEMATOCRIT: 33.7 % — AB (ref 39.0–52.0)
Hemoglobin: 11.4 g/dL — ABNORMAL LOW (ref 13.0–17.0)
Lymphocytes Relative: 31 %
Lymphs Abs: 4.9 10*3/uL — ABNORMAL HIGH (ref 0.7–4.0)
MCH: 27.4 pg (ref 26.0–34.0)
MCHC: 33.8 g/dL (ref 30.0–36.0)
MCV: 81 fL (ref 78.0–100.0)
METAMYELOCYTES PCT: 0 %
MONOS PCT: 3 %
Monocytes Absolute: 0.5 10*3/uL (ref 0.1–1.0)
Myelocytes: 0 %
NEUTROS PCT: 55 %
NRBC: 0 /100{WBCs}
Neutro Abs: 8.8 10*3/uL — ABNORMAL HIGH (ref 1.7–7.7)
OTHER: 0 %
PROMYELOCYTES ABS: 0 %
Platelets: 409 10*3/uL — ABNORMAL HIGH (ref 150–400)
RBC: 4.16 MIL/uL — ABNORMAL LOW (ref 4.22–5.81)
RDW: 16.4 % — AB (ref 11.5–15.5)
WBC: 15.9 10*3/uL — ABNORMAL HIGH (ref 4.0–10.5)

## 2016-05-19 LAB — TROPONIN I

## 2016-05-19 LAB — COMPREHENSIVE METABOLIC PANEL
ALBUMIN: 4.2 g/dL (ref 3.5–5.0)
ALK PHOS: 65 U/L (ref 38–126)
ALT: 18 U/L (ref 17–63)
AST: 17 U/L (ref 15–41)
Anion gap: 8 (ref 5–15)
BILIRUBIN TOTAL: 0.7 mg/dL (ref 0.3–1.2)
BUN: 9 mg/dL (ref 6–20)
CALCIUM: 8.9 mg/dL (ref 8.9–10.3)
CO2: 27 mmol/L (ref 22–32)
CREATININE: 0.98 mg/dL (ref 0.61–1.24)
Chloride: 104 mmol/L (ref 101–111)
GFR calc Af Amer: >60 mL/min (ref >60)
GFR calc non Af Amer: >60 mL/min (ref >60)
GLUCOSE: 134 mg/dL — AB (ref 65–99)
POTASSIUM: 3.9 mmol/L (ref 3.5–5.1)
Sodium: 139 mmol/L (ref 135–145)
TOTAL PROTEIN: 7.2 g/dL (ref 6.5–8.1)

## 2016-05-19 MED ORDER — FENTANYL CITRATE (PF) 100 MCG/2ML IJ SOLN
50.0000 ug | Freq: Once | INTRAMUSCULAR | Status: AC
  Administered 2016-05-19: 50 ug via INTRAVENOUS

## 2016-05-19 NOTE — ED Provider Notes (Signed)
[facility] DEPT Provider Note   CSN: [id number] Arrival date & time: 05/19/16  2103  By signing my name below, I, David Nelson, attest that this documentation has been prepared under the direction and in the presence of [name], MD. Electronically Signed: Elder Nelson, Scribe. 05/19/16. 11:22 PM.   History   Chief Complaint Chief Complaint  Patient presents with  . Chest Pain    HPI David Nelson is a 56 y.o. male with history of CAD not anticoagulated, diabetes, HTN, and PAD who presents to the ED with chest pain. This patient states that 3 days ago he was walking and "his legs felt like noodles" and he fell to his back/buttocks. He did not strike his head or loss consciousness. He states that his legs frequently had these symptoms due to PAD. Shortly thereafter he developed L sided chest pain which extended into the L neck and L arm. His pain has been constant for 3 days. Improves when using a heating pad. Worse when moving his arm or twisting. Taking Ibuprofen at home without improvement. Reporting occasional dyspnea. No diaphoresis or nausea. He does not believe he struck his chest during his fall. His legs are at his normal sensation and strength currently.   The history is provided by the patient. No language interpreter was used.    Past Medical History:  Diagnosis Date  . CAD (coronary artery disease)   . Chronic abdominal pain   . Depression   . Diabetes mellitus without complication (HCC)   . GERD (gastroesophageal reflux disease)   . Hypertension   . IBS (irritable bowel syndrome)   . Pancreatitis   . Prostatitis   . PVD (peripheral vascular disease) [facility])     Patient Active Problem List   Diagnosis Date Noted  . Diabetes mellitus without complication (HCC)   . IBS (irritable bowel syndrome)   . CAD (coronary artery disease)   . Hypertension   . GERD (gastroesophageal reflux disease)   . Pancreatitis     History reviewed. No pertinent  surgical history.     Home Medications    Prior to Admission medications   Medication Sig Start Date End Date Taking? Authorizing Provider  acetaminophen (TYLENOL) 500 MG tablet Take 2 tablets (1,000 mg total) by mouth every 8 (eight) hours as needed. 02/07/16  Yes Alexandra M Law, PA-C  ASPIRIN PO Take 1 tablet by mouth once as needed (for pain).   Yes Historical Provider, MD  atorvastatin (LIPITOR) 20 MG tablet TAKE 1 Tablet BY MOUTH EVERY NIGHT AT BEDTIME (REPLACES CRESTOR) 03/01/16  Yes Historical Provider, MD  DULoxetine (CYMBALTA) 30 MG capsule Take 30 mg by mouth 2 (two) times daily.   Yes Historical Provider, MD  gabapentin (NEURONTIN) 300 MG capsule Take 300 mg by mouth 2 (two) times daily.    Yes Historical Provider, MD  lisinopril (PRINIVIL,ZESTRIL) 10 MG tablet Take 10 mg by mouth daily.   Yes Historical Provider, MD  metFORMIN (GLUCOPHAGE) 500 MG tablet Take 500 mg by mouth 2 (two) times daily. 03/07/16  Yes Historical Provider, MD  omeprazole (PRILOSEC) 10 MG capsule Take 10 mg by mouth daily.   Yes Historical Provider, MD  omeprazole (PRILOSEC) 20 MG capsule Take 40 mg by mouth daily. 02/24/16  Yes Historical Provider, MD  tamsulosin (FLOMAX) 0.4 MG CAPS capsule Take 0.4 mg by mouth every evening.  01/12/16  Yes Historical Provider, MD    Family History Family History  Problem Relation Age of Onset  . Family  history unknown: Yes    Social History Social History  Substance Use Topics  . Smoking status: Current Every Day Smoker    Packs/day: 0.50    Types: Cigarettes  . Smokeless tobacco: Never Used  . Alcohol use No     Allergies   Ibuprofen   Review of Systems Review of Systems  Respiratory: Positive for shortness of breath (intermittent).   Cardiovascular: Positive for chest pain.  Gastrointestinal: Negative for nausea.  Neurological: Positive for weakness (brief, bilateral legs).  All other systems reviewed and are negative.    Physical Exam Updated  Vital Signs BP (!) 163/93 (BP Location: Left Arm)   Pulse 85   Temp 98.1 F (36.7 C) (Oral)   Resp 12   Ht 5\' 6"  (1.676 m)   Wt 182 lb (82.6 kg)   SpO2 100%   BMI 29.38 kg/m   Physical Exam  Constitutional: He is oriented to person, place, and time. He appears well-developed and well-nourished. No distress.  HENT:  Head: Normocephalic and atraumatic.  Mouth/Throat: Oropharynx is clear and moist. No oropharyngeal exudate.  Eyes: Conjunctivae and EOM are normal. Pupils are equal, round, and reactive to light.  Neck: Normal range of motion. Neck supple.  No meningismus.  Cardiovascular: Normal rate, regular rhythm and normal heart sounds.   No murmur heard. 2+ radial, DP, and PT pulses.   Pulmonary/Chest: Effort normal and breath sounds normal. No respiratory distress.  Left chest wall is tender to palpation.   Abdominal: Soft. There is no tenderness. There is no rebound and no guarding.  Musculoskeletal: Normal range of motion. He exhibits no edema or tenderness.  Neurological: He is alert and oriented to person, place, and time. No cranial nerve deficit. He exhibits normal muscle tone. Coordination normal.   5/5 strength throughout. CN 2-12 intact.Equal grip strength.   Skin: Skin is warm.  Psychiatric: He has a normal mood and affect. His behavior is normal.  Nursing note and vitals reviewed.    ED Treatments / Results  Labs (all labs ordered are listed, but only abnormal results are displayed) Labs Reviewed  CBC WITH DIFFERENTIAL/PLATELET - Abnormal; Notable for the following:       Result Value   WBC 15.9 (*)    RBC 4.16 (*)    Hemoglobin 11.4 (*)    HCT 33.7 (*)    RDW 16.4 (*)    Platelets 409 (*)    Neutro Abs 8.8 (*)    Lymphs Abs 4.9 (*)    Eosinophils Absolute 1.7 (*)    All other components within normal limits  COMPREHENSIVE METABOLIC PANEL - Abnormal; Notable for the following:    Glucose, Bld 134 (*)    All other components within normal limits    TROPONIN I  TROPONIN I    EKG  EKG Interpretation  Date/Time:  Friday May 19 2016 21:41:45 EDT Ventricular Rate:  88 PR Interval:    QRS Duration: 84 QT Interval:  395 QTC Calculation: 478 R Axis:   1 Text Interpretation:  Sinus rhythm Low voltage, precordial leads Nonspecific T abnormalities, lateral leads Borderline prolonged QT interval No significant change was found Confirmed by Manus Gunning  MD, Myishia Kasik [phone]) on 05/19/2016 10:59:35 PM       Radiology Dg Chest 2 View  Result Date: 05/19/2016 CLINICAL DATA:  Initial evaluation for acute chest and left arm pain. EXAM: CHEST  2 VIEW COMPARISON:  Prior radiograph from 11/03/2015. FINDINGS: The cardiac and mediastinal silhouettes are stable in  size and contour, and remain within normal limits. The lungs are normally inflated. No airspace consolidation, pleural effusion, or pulmonary edema is identified. There is no pneumothorax. No acute osseous abnormality identified. IMPRESSION: No active cardiopulmonary disease. Electronically Signed   By: Rise Mu M.D.   On: 05/19/2016 22:50   Ct Head Wo Contrast  Result Date: 05/20/2016 CLINICAL DATA:  56 year old male with left arm numbness. EXAM: CT HEAD WITHOUT CONTRAST CT CERVICAL SPINE WITHOUT CONTRAST TECHNIQUE: Multidetector CT imaging of the head and cervical spine was performed following the standard protocol without intravenous contrast. Multiplanar CT image reconstructions of the cervical spine were also generated. COMPARISON:  None. FINDINGS: CT HEAD FINDINGS Brain: No evidence of acute infarction, hemorrhage, hydrocephalus, extra-axial collection or mass lesion/mass effect. Vascular: No hyperdense vessel or unexpected calcification. Skull: Normal. Negative for fracture or focal lesion. Sinuses/Orbits: There is diffuse mucoperiosteal thickening and opacification of the paranasal sinuses most consistent with chronic sinusitis. There is associated sclerotic changes of the walls of  the paranasal sinuses. No air-fluid levels. The mastoid air cells are clear. Other: None CT CERVICAL SPINE FINDINGS Alignment: No acute subluxation. There is mild reversal of normal cervical lordosis at C5-C6 likely secondary to degenerative changes. Skull base and vertebrae: No acute fracture. No primary bone lesion or focal pathologic process. Soft tissues and spinal canal: No prevertebral fluid or swelling. No visible canal hematoma. Disc levels: Multilevel disc disease most prominent at C5-C6 and C6-C7 with associated endplate irregularity and disc space narrowing and osteophyte formation. Upper chest: Negative. Other: A 2.2 x 1.8 cm right thyroid hypodense nodule. Ultrasound is recommended for further characterization. IMPRESSION: 1. No acute intracranial pathology. 2. No acute/traumatic cervical spine pathology. Degenerative changes primarily involving C4-C7 with mild associated reversal of normal cervical lordosis. 3. Right thyroid hypodense nodule. Electronically Signed   By: Elgie Collard M.D.   On: 05/20/2016 05:17   Ct Cervical Spine Wo Contrast  Result Date: 05/20/2016 CLINICAL DATA:  56 year old male with left arm numbness. EXAM: CT HEAD WITHOUT CONTRAST CT CERVICAL SPINE WITHOUT CONTRAST TECHNIQUE: Multidetector CT imaging of the head and cervical spine was performed following the standard protocol without intravenous contrast. Multiplanar CT image reconstructions of the cervical spine were also generated. COMPARISON:  None. FINDINGS: CT HEAD FINDINGS Brain: No evidence of acute infarction, hemorrhage, hydrocephalus, extra-axial collection or mass lesion/mass effect. Vascular: No hyperdense vessel or unexpected calcification. Skull: Normal. Negative for fracture or focal lesion. Sinuses/Orbits: There is diffuse mucoperiosteal thickening and opacification of the paranasal sinuses most consistent with chronic sinusitis. There is associated sclerotic changes of the walls of the paranasal sinuses.  No air-fluid levels. The mastoid air cells are clear. Other: None CT CERVICAL SPINE FINDINGS Alignment: No acute subluxation. There is mild reversal of normal cervical lordosis at C5-C6 likely secondary to degenerative changes. Skull base and vertebrae: No acute fracture. No primary bone lesion or focal pathologic process. Soft tissues and spinal canal: No prevertebral fluid or swelling. No visible canal hematoma. Disc levels: Multilevel disc disease most prominent at C5-C6 and C6-C7 with associated endplate irregularity and disc space narrowing and osteophyte formation. Upper chest: Negative. Other: A 2.2 x 1.8 cm right thyroid hypodense nodule. Ultrasound is recommended for further characterization. IMPRESSION: 1. No acute intracranial pathology. 2. No acute/traumatic cervical spine pathology. Degenerative changes primarily involving C4-C7 with mild associated reversal of normal cervical lordosis. 3. Right thyroid hypodense nodule. Electronically Signed   By: Elgie Collard M.D.   On: 05/20/2016 05:17  Ct Angio Chest/abd/pel For Dissection W And/or Wo Contrast  Result Date: 05/20/2016 CLINICAL DATA:  56 year old male with fall 3 days ago presenting with chest pain and back pain. EXAM: CT ANGIOGRAPHY CHEST, ABDOMEN AND PELVIS TECHNIQUE: Multidetector CT imaging through the chest, abdomen and pelvis was performed using the standard protocol during bolus administration of intravenous contrast. Multiplanar reconstructed images and MIPs were obtained and reviewed to evaluate the vascular anatomy. CONTRAST:  100 cc Isovue 370 COMPARISON:  Chest radiograph dated 05/19/2016 and CT of the abdomen pelvis dated 02/07/2016 and chest CT dated 12/09/2013 FINDINGS: CTA CHEST FINDINGS Cardiovascular: There is no cardiomegaly or pericardial effusion. Mild coronary vascular calcification primarily involving the delete the. The thoracic aorta appears unremarkable. No aneurysmal dilatation or evidence of dissection. The  origins of the great vessels of the aortic arch appear patent. There is no CT evidence of pulmonary embolism. Mediastinum/Nodes: No hilar or mediastinal adenopathy. The esophagus is grossly unremarkable. There is a 1.8 cm right thyroid hypodense nodule. Ultrasound is recommended for further characterization. Lungs/Pleura: There is a 4 mm nodular density in the right upper lobe (series 7, image 20) which appears similar to the chest CT dated 12/09/2013 and likely postinflammatory. The lungs are otherwise clear. There is no pleural effusion or pneumothorax. The central airways are patent. Musculoskeletal: There is no axillary adenopathy. The chest wall soft tissues appear unremarkable. No acute osseous pathology. Review of the MIP images confirms the above findings. CTA ABDOMEN AND PELVIS FINDINGS VASCULAR Aorta: Moderate atherosclerotic calcification of the aorta as well as noncalcified atherosclerotic plaques similar to the prior CT. There is no aneurysmal dilatation or evidence of dissection. Celiac: Patent without evidence of aneurysm, dissection, vasculitis or significant stenosis. SMA: Patent without evidence of aneurysm, dissection, vasculitis or significant stenosis. Renals: Both renal arteries are patent without evidence of aneurysm, dissection, vasculitis, fibromuscular dysplasia or significant stenosis. IMA: There is high-grade narrowing of the origin of the IMA similar to prior CT. The vessel however remains patent. Inflow: There is moderate calcified and noncalcified atherosclerotic plaque involving the common iliac artery is. There is extensive atherosclerotic disease along the course of the right external iliac artery with diminutive appearing vessel and luminal narrowing similar to the prior CT. There is also stable atherosclerotic disease of the right internal iliac artery. The right internal iliac artery remains patent and demonstrates better enhancement compared to the right external iliac artery.  There is occlusion of the visualized portion of the proximal right superficial femoral artery. Moderate to severe calcified and noncalcified atherosclerotic plaques involving the left external and internal iliac artery is appear similar to the prior CT. These vessels remain patent. The left external iliac artery is small similar to prior CT. Veins: No obvious venous abnormality within the limitations of this arterial phase study. Review of the MIP images confirms the above findings. NON-VASCULAR Hepatobiliary: No focal liver abnormality is seen. No gallstones, gallbladder wall thickening, or biliary dilatation. Pancreas: Unremarkable. No pancreatic ductal dilatation or surrounding inflammatory changes. Spleen: Normal in size without focal abnormality. Adrenals/Urinary Tract: Adrenal glands are unremarkable. Kidneys are normal, without renal calculi, focal lesion, or hydronephrosis. Bladder is unremarkable. Stomach/Bowel: Small scattered sigmoid diverticula without active inflammatory changes. There is no evidence of bowel obstruction or active inflammation. Normal appendix. Lymphatic: No adenopathy. Reproductive: The prostate and seminal vesicles are grossly unremarkable. Other: No intra-abdominal free air or free fluid. Musculoskeletal: No acute or significant osseous findings. Review of the MIP images confirms the above findings. IMPRESSION: 1. No  acute intrathoracic, abdominal, or pelvic pathology. No evidence of aortic dissection or aneurysm. No CT evidence of pulmonary embolism. 2. Atherosclerotic aorta and bilateral iliac arteries with no significant interval change in the degree of atherosclerosis compared to the prior study. Moderate to severe bilateral external iliac artery calcified and noncalcified plaque with diminutive appearance of the vessels as seen on the prior CT. There is occlusion of the visualized proximal right superficial femoral artery similar to prior CT. 3. Stable stenosis of the origin of  the IMA. This vessel remains patent. The origins of the celiac axis and SMA are widely patent. 4. Incidental findings of right thyroid nodule and a 4 mm right upper lobe pulmonary nodule. These findings are similar to prior CT of 2015. Electronically Signed   By: Elgie Collard M.D.   On: 05/20/2016 01:33    Procedures Procedures (including critical care time)  Medications Ordered in ED Medications - No data to display   Initial Impression / Assessment and Plan / ED Course  I have reviewed the triage vital signs and the nursing notes.  Pertinent labs & imaging results that were available during my care of the patient were reviewed by me and considered in my medical decision making (see chart for details).    3 days of constant left-sided chest pain worse with palpation and movement. States he had a fall several days ago when his legs became weak. Denies any trauma to his chest. EKG shows no acute ST changes.  Chest pain is reproducible, worse with L arm movement and palpation. CXR negative.  CTA negative for dissection or PE.  Stable atherosclerotic disease.  Troponin negative x2.  Low suspicion for ACS or PE.  Chest pain worse with palpation and arm movement.  Patient is tolerating PO and ambulatory. Follow up with PCP. Return precautions discussed.   Final Clinical Impressions(s) / ED Diagnoses   Final diagnoses:  Atypical chest pain    New Prescriptions New Prescriptions   No medications on file  I personally performed the services described in this documentation, which was scribed in my presence. The recorded information has been reviewed and is accurate.    [name], MD 05/20/16 [phone]

## 2016-05-19 NOTE — ED Triage Notes (Signed)
Pt reports he fell 3 days ago, states he is not sure why he fell but he had just got up off of the couch and his leg went out from under him.  Pt reports since then he has been having chest pain, pain to the base of his neck and pain and numbness in left shoulder and down left arm.

## 2016-05-19 NOTE — ED Triage Notes (Signed)
Leg went numb and pt reports falling-   No PCP

## 2016-05-19 NOTE — ED Triage Notes (Signed)
Pt reports "severe chest pain"  X 3 days and now with numbness down his left arm that is worse with putting pressure on his arm

## 2016-05-20 ENCOUNTER — Emergency Department (HOSPITAL_COMMUNITY): Payer: Self-pay

## 2016-05-20 LAB — TROPONIN I: Troponin I: <0.03 ng/mL (ref <0.03)

## 2016-05-20 MED ORDER — KETOROLAC TROMETHAMINE 30 MG/ML IJ SOLN
30.0000 mg | Freq: Once | INTRAMUSCULAR | Status: AC
  Administered 2016-05-20: 30 mg via INTRAVENOUS
  Filled 2016-05-20: qty 1

## 2016-05-20 MED ORDER — IOPAMIDOL (ISOVUE-370) INJECTION 76%
100.0000 mL | Freq: Once | INTRAVENOUS | Status: AC | PRN
  Administered 2016-05-20: 100 mL via INTRAVENOUS

## 2016-05-20 MED ORDER — NAPROXEN 500 MG PO TABS
500.0000 mg | ORAL_TABLET | Freq: Two times a day (BID) | ORAL | 0 refills | Status: DC
Start: 2016-05-20 — End: 2016-06-06

## 2016-05-20 MED ORDER — SODIUM CHLORIDE 0.9 % IV BOLUS (SEPSIS)
500.0000 mL | Freq: Once | INTRAVENOUS | Status: AC
  Administered 2016-05-20: 500 mL via INTRAVENOUS

## 2016-05-20 NOTE — Discharge Instructions (Signed)
There is no evidence of heart attack or blood clot in the lung. Followup with your primary doctor. Return to the ED if you develop new or worsening symptoms.

## 2016-05-20 NOTE — ED Notes (Signed)
Document error on discharge condition

## 2016-05-20 NOTE — ED Notes (Signed)
Went in to discharge this pt and pt's BP was low x 3. EDP notified and orders received for  A 500 cc bolus.

## 2016-06-05 DIAGNOSIS — Z139 Encounter for screening, unspecified: Secondary | ICD-10-CM

## 2016-06-05 LAB — GLUCOSE, POCT (MANUAL RESULT ENTRY): POC GLUCOSE: 113 mg/dL — AB (ref 70–99)

## 2016-06-05 NOTE — Congregational Nurse Program (Signed)
Congregational Nurse Program Note  Date of Encounter: 06/05/2016  Past Medical History: Past Medical History:  Diagnosis Date  . CAD (coronary artery disease)   . Chronic abdominal pain   . Depression   . Diabetes mellitus without complication (HCC)   . GERD (gastroesophageal reflux disease)   . Hypertension   . IBS (irritable bowel syndrome)   . Pancreatitis   . Prostatitis   . PVD (peripheral vascular disease) [facility])     Encounter Details:     CNP Questionnaire - 06/05/16 1532      Patient Demographics   Is this a new or existing patient? New   Patient is considered a/an Not Applicable   Race Caucasian/White     Patient Assistance   Location of Patient Assistance [name]   Patient's financial/insurance status Low Income;Self-Pay (Uninsured)   Uninsured Patient ([place]/Care Connects) Yes   Interventions Counseled to make appt. with provider;Assisted patient in making appt.   Patient referred to apply for the following financial assistance [facility]   Food insecurities addressed Not Applicable   Transportation assistance No   Assistance securing medications No   Educational health offerings Behavioral health;Chronic disease;Hypertension;Navigating the healthcare system     Encounter Details   Primary purpose of visit Chronic Illness/Condition Visit;Education/Health Concerns;Navigating the Healthcare System   Was an Emergency Department visit averted? Not Applicable   Does patient have a medical provider? No   Patient referred to Area Agency;Clinic;Establish PCP   Was a mental health screening completed? (GAINS tool) No   Does patient have dental issues? No   Does patient have vision issues? No   Does your patient have an abnormal blood pressure today? No   Since previous encounter, have you referred patient for abnormal blood pressure that resulted in a new diagnosis or medication change? No   Does your patient have an abnormal blood glucose  today? No   Since previous encounter, have you referred patient for abnormal blood glucose that resulted in a new diagnosis or medication change? No   Was there a life-saving intervention made? No      New client to [facility]. Client here today seeking help navigating into a primary care provider in [facility]. He does not have medical insurance. He has applied for disability and has applied for medicaid. He does receive food stamps. He is currently living in [place] with his daughter. He was formerly a patient ad [employer] in [place] for medical and mental health services. He states he hasn't been there since moving to [facility] in December or January.  Client states that while in [facility] he was receiving [place] [facility] for his medications, but is unsure when it is time for renewal. He also states that he was receiving [facility] discount assistance, but also is not unsure when he is to renew. He states he has not received a letter for this. He was recently on 05/19/16 in the [facility] ER with atypical chest pain, had negative Trop and negative CT of chest and abdomen for dissection aneurysm at that time. He reports he could not afford to get the naproxen filled so has not been taking it.  Current complaints of chronic abdominal pain, along with Chronic nerve pain in legs and chronic neck and lower back pain. Vitals : 125/72 left arm sitting, pulse 90 , Temp 98.9 orally , non fasting glucose 113.  Current medications per client: Ibuprofen 600 mg orally as  needed for pain Lisinopril 10 mg orally one daily Cymbalta 30 mg orally one twice a day Gabapentin 300 mg orally one twice daily Men's one a day vitamin Metformin 500 mg orally one tablet two times a day Omeprazole 20 mg orally daily Client reports he has not had his gabapentin since around December.  Past medical History per client: Anxiety  Depression Pancreatitis  IBS DM type 2   PAD Neuropathy  Client states he has been told he has carotic blockages and also blockages of arteries in his legs. Degenerative disc disease Sciatica  Chronic Bronchitis GERD  Current everyday smoker cigar 1 pk/42 years and denies wanting to quit. Counseled client on affects of smoking along with diabetes as well as hypertension and vascular disease increases his risk of heart attack and stroke. Client states understanding and states he will take it under consideration.  Client also reports smoking marijuana 3 times a day everyday for a "long time" states he uses it for pain control. Denies Alcohol use. States has not used alcohol in 4 years since having pancreatitis.  Alert and oriented to person, place and time. Answers questions appropriately. Does not appear anxious or nervous. Denies chest pain currently. Lungs clear, client reports ocassional shortness of breath and coughing and attributes that with his smoking. Abdomen soft and slightly tender with palpation, bowel sounds present. No edema noted in the lower extremities. 2 + DP  Bilaterally Mood normal, denies current thoughts of suicide and denies past attempts. Client does state he has had counseling and also saw a psychiatrist in the past.   Dicussed with client options for medical care as well as mental health services. Will refer client into the Free clinic for medical care and appointment secured for 06/06/16 at 1:30 PM. Contact information given. Client given a written times for walk in intakes for [facility] in [place] for mental health services. Walk in times are Monday thru Friday beginning at 8 am . RN encouraged client to go early in order to have time to finish intake process. Also gave client [facility] number to keep. Client verbalizes understanding of above.  Will follow up with client as needed and after his first appointment at the Free clinic. Will follow up with client after

## 2016-06-06 ENCOUNTER — Encounter: Payer: Self-pay | Admitting: Physician Assistant

## 2016-06-06 ENCOUNTER — Ambulatory Visit: Payer: Self-pay | Admitting: Physician Assistant

## 2016-06-06 VITALS — BP 116/66 | HR 91 | Temp 98.2°F | Ht 65.25 in | Wt 179.8 lb

## 2016-06-06 DIAGNOSIS — G8929 Other chronic pain: Secondary | ICD-10-CM

## 2016-06-06 DIAGNOSIS — I7090 Unspecified atherosclerosis: Secondary | ICD-10-CM

## 2016-06-06 DIAGNOSIS — E118 Type 2 diabetes mellitus with unspecified complications: Secondary | ICD-10-CM

## 2016-06-06 DIAGNOSIS — E041 Nontoxic single thyroid nodule: Secondary | ICD-10-CM

## 2016-06-06 DIAGNOSIS — F1011 Alcohol abuse, in remission: Secondary | ICD-10-CM

## 2016-06-06 DIAGNOSIS — F17219 Nicotine dependence, cigarettes, with unspecified nicotine-induced disorders: Secondary | ICD-10-CM

## 2016-06-06 DIAGNOSIS — K589 Irritable bowel syndrome without diarrhea: Secondary | ICD-10-CM

## 2016-06-06 DIAGNOSIS — I10 Essential (primary) hypertension: Secondary | ICD-10-CM

## 2016-06-06 DIAGNOSIS — R911 Solitary pulmonary nodule: Secondary | ICD-10-CM

## 2016-06-06 DIAGNOSIS — K219 Gastro-esophageal reflux disease without esophagitis: Secondary | ICD-10-CM

## 2016-06-06 DIAGNOSIS — D649 Anemia, unspecified: Secondary | ICD-10-CM

## 2016-06-06 MED ORDER — GABAPENTIN 300 MG PO CAPS: 300.0000 mg | ORAL_CAPSULE | Freq: Two times a day (BID) | ORAL | 0 refills | Status: AC

## 2016-06-06 NOTE — Patient Instructions (Signed)
[facility] Customer service- [phone] Financial Counselor- [facility]- [phone]

## 2016-06-06 NOTE — Progress Notes (Signed)
BP 116/66 (BP Location: Left Arm, Patient Position: Sitting, Cuff Size: Normal)   Pulse 91   Temp 98.2 F (36.8 C)   Ht 5' 5.25" (1.657 m)   Wt 179 lb 12 oz (81.5 kg)   SpO2 96%   BMI 29.68 kg/m    Subjective:    Patient ID: David Nelson, male    DOB: [birth year], 56 y.o.   MRN: [id number]  HPI: David Friendsrnest Yellin is a 56 y.o. male presenting on 06/06/2016 for New Patient (Initial Visit)   HPI   Pt has applied for medicaid - he hasn't heard yet whether he was approved.   Pt says he just renewed his medassist   [facility]. He moved here aftet christmas.  Pt says he has CAD but he has never been seen by cardiologist.  he says he has never had heart attack.  He says he has it b/c he has been to the ER with CP and it runs in the family.    Pt was Dx with dm about 20 years ago.   He says he has never had to be on insulin but he says he has "really bad nerve pain"    Pt uses a cane. He says he has been having problems walking for 10 years.   Pt says he has chest pains some times.  He says it is Less than once/week.  He says he thinks he has CP less than once/month.   He has been having lots of stomach pain recently. He doesn't know if it's ibs or pancreatitis.  Pt with hx alcoholism.  He has been sober for 5 years.   Interview and exam limited by pt's 2yo grandson who is with him today.   Relevant past medical, surgical, family and social history reviewed and updated as indicated. Interim medical history since our last visit reviewed. Allergies and medications reviewed and updated.   Current Outpatient Prescriptions:  .  atorvastatin (LIPITOR) 20 MG tablet, TAKE 1 Tablet BY MOUTH EVERY NIGHT AT BEDTIME (REPLACES CRESTOR), Disp: , Rfl: 4 .  B Complex-C-Folic Acid (STRESS B COMPLEX PO), Take by mouth., Disp: , Rfl:  .  DULoxetine (CYMBALTA) 30 MG capsule, Take 30 mg by mouth 2 (two) times daily., Disp: , Rfl:  .  ibuprofen (ADVIL,MOTRIN)  600 MG tablet, Take 600 mg by mouth every 8 (eight) hours as needed., Disp: , Rfl:  .  lisinopril (PRINIVIL,ZESTRIL) 10 MG tablet, Take 10 mg by mouth daily., Disp: , Rfl:  .  metFORMIN (GLUCOPHAGE) 500 MG tablet, Take 500 mg by mouth 2 (two) times daily., Disp: , Rfl: 3 .  Multiple Vitamins-Minerals (MULTI FOR HIM 50+ PO), Take by mouth., Disp: , Rfl:  .  omeprazole (PRILOSEC) 40 MG capsule, Take 40 mg by mouth daily., Disp: , Rfl:  .  tamsulosin (FLOMAX) 0.4 MG CAPS capsule, Take 0.4 mg by mouth every evening. , Disp: , Rfl: 3  Review of Systems  Constitutional: Positive for fatigue. Negative for appetite change, chills, diaphoresis, fever and unexpected weight change.  HENT: Positive for congestion, mouth sores and voice change. Negative for drooling, ear pain, facial swelling, hearing loss, sneezing, sore throat and trouble swallowing.   Eyes: Negative for pain, discharge, redness, itching and visual disturbance.  Respiratory: Positive for wheezing. Negative for cough, choking and shortness of breath.   Cardiovascular: Positive for leg swelling. Negative for chest pain and palpitations.  Gastrointestinal: Positive for abdominal pain. Negative for blood in  stool, constipation, diarrhea and vomiting.  Endocrine: Negative for cold intolerance, heat intolerance and polydipsia.  Genitourinary: Negative for decreased urine volume, dysuria and hematuria.  Musculoskeletal: Positive for arthralgias, back pain and gait problem.  Skin: Negative for rash.  Allergic/Immunologic: Negative for environmental allergies.  Neurological: Positive for light-headedness. Negative for seizures, syncope and headaches.  Hematological: Negative for adenopathy.  Psychiatric/Behavioral: Positive for agitation and dysphoric mood. Negative for suicidal ideas. The patient is nervous/anxious.     Per HPI unless specifically indicated above     Objective:    BP 116/66 (BP Location: Left Arm, Patient Position:  Sitting, Cuff Size: Normal)   Pulse 91   Temp 98.2 F (36.8 C)   Ht 5' 5.25" (1.657 m)   Wt 179 lb 12 oz (81.5 kg)   SpO2 96%   BMI 29.68 kg/m   Wt Readings from Last 3 Encounters:  06/06/16 179 lb 12 oz (81.5 kg)  06/05/16 180 lb (81.6 kg)  05/19/16 182 lb (82.6 kg)    Physical Exam  Constitutional: He is oriented to person, place, and time. He appears well-developed and well-nourished.  HENT:  Head: Normocephalic and atraumatic.  Mouth/Throat: Oropharynx is clear and moist. No oropharyngeal exudate.  Eyes: Conjunctivae and EOM are normal. Pupils are equal, round, and reactive to light.  Neck: Neck supple. No thyromegaly present.  Cardiovascular: Normal rate and regular rhythm.   Pulmonary/Chest: Effort normal and breath sounds normal. He has no wheezes. He has no rales.  Abdominal: Soft. Bowel sounds are normal. He exhibits no mass. There is no hepatosplenomegaly. There is no tenderness.  Musculoskeletal: He exhibits no edema.  Lymphadenopathy:    He has no cervical adenopathy.  Neurological: He is alert and oriented to person, place, and time.  Skin: Skin is warm and dry. No rash noted.  Psychiatric: He has a normal mood and affect. His behavior is normal. Thought content normal.  Vitals reviewed.   Results for orders placed or performed in visit on 06/05/16  POCT glucose  Result Value Ref Range   POC Glucose 113 (A) 70 - 99 mg/dl      Assessment & Plan:    Encounter Diagnoses  Name Primary?  . Controlled diabetes mellitus type 2 with complications, unspecified whether long term insulin use (HCC) Yes  . Cigarette nicotine dependence with nicotine-induced disorder   . Essential hypertension   . Other chronic pain   . Gastroesophageal reflux disease, esophagitis presence not specified   . Alcohol abuse, in remission   . Irritable bowel syndrome, unspecified type   . Thyroid nodule   . Lung nodule   . Atherosclerosis   . Anemia, unspecified type      -pt to  check on his [facility] discount.  He says he thinks he has one. He is given phone number to call to check.  He is given application in the event that it has expired -pt will need [place] for thyroid nodule (seen on CT 05/20/16) -pt has Lung nodule,, RUL- ct 05/20/16 appears similar to CT 12/09/2013 -recent imaging shows Atherosclerosis LE/PAD -Anemia -Chronic abd pain (seen [facility] in past)- pt needs to be re-referred for his abd pain -Gave sample aspirin to start -Will see about referral to pain clinic when he finds out about [facility] discount. Will rx gabapaentin one time for now.  Discussed with pt that [facility] not a pain clinic -pt to get baseline labs drawn tomorrow morning while fasting -follow up OV 1 month. RTO sooner  prn

## 2016-06-20 ENCOUNTER — Telehealth: Payer: Self-pay

## 2016-06-20 NOTE — Telephone Encounter (Signed)
[facility]16 seeking help navigating into a primary care provider. No medical insurance. Patient stated he has applied for disability, and medicaid.   After Dicussing with client options for medical care as well as mental health services. Will refer client into the Free clinic for medical care and appointment secured for 06/06/16 at 1:30 PM.  Also patient was given a written times for walk in intakes for [facility] in [place] for mental health services. Walk in times are Monday thru Friday beginning at 8 am . RN encouraged client to go early in order to have time to finish intake process. Also gave client [facility] number to keep.  Called patient as a follow-up call and check -in on his status.   No answer. Left message stating the reason for the call and gave number to return call.    David Nelson R. David Soltys LPN [phone]

## 2016-07-04 ENCOUNTER — Ambulatory Visit: Payer: Self-pay | Admitting: Physician Assistant

## 2016-07-12 ENCOUNTER — Encounter: Payer: Self-pay | Admitting: Physician Assistant

## 2016-09-21 IMAGING — CT CT ABD-PELV W/ CM
2 of 5 series · 16 of 46 positions shown, 18 images · IV contrast (omnipaque)
Comparison: None.

CLINICAL DATA: Abdominal pain for 1 week, nausea and vomiting

EXAM:
CT ABDOMEN AND PELVIS WITH CONTRAST
TECHNIQUE: Multidetector CT imaging of the abdomen and pelvis was performed
using the standard protocol following bolus administration of
intravenous contrast.
CONTRAST:  25mL OMNIPAQUE IOHEXOL 300 MG/ML SOLN, 100mL OMNIPAQUE
IOHEXOL 300 MG/ML SOLN

[Series 2: abd/pelvis 5.0 b31f · axial · 0.74mm/px · z∈[-450,-45]mm · 13 of 93 slices shown, 15 images]
[im 6/93  soft-tissue]
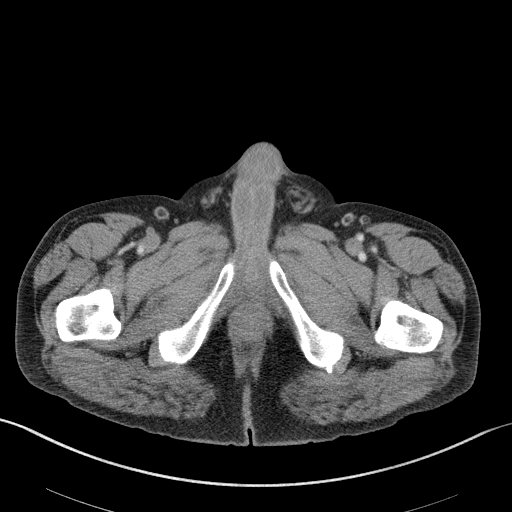
[im 6/93  bone]
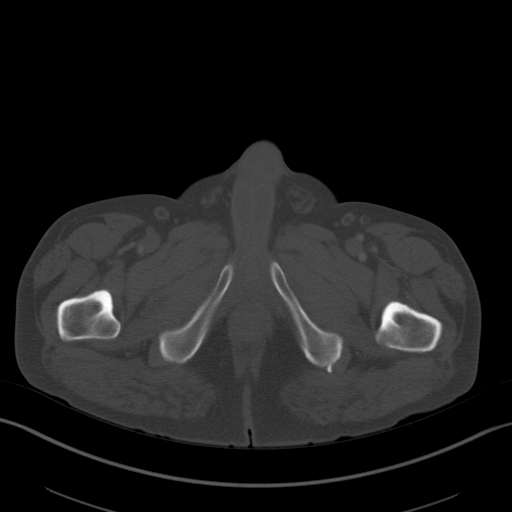
[im 11/93  soft-tissue]
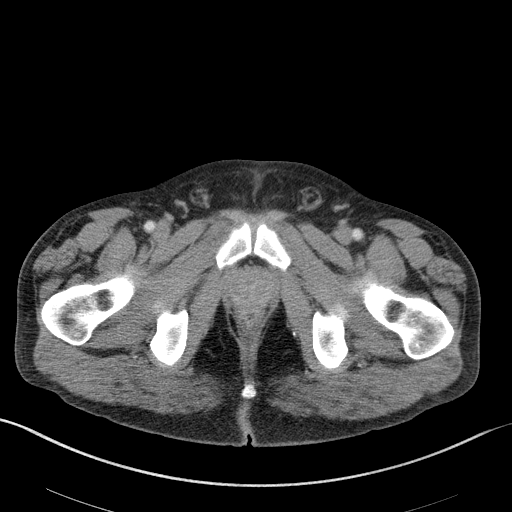
[im 21/93  soft-tissue]
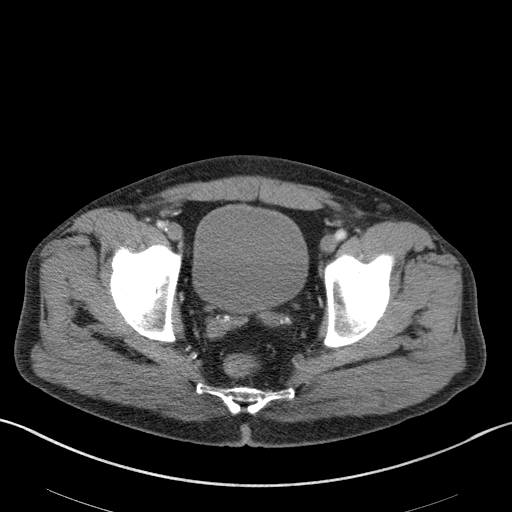
[im 26/93  soft-tissue]
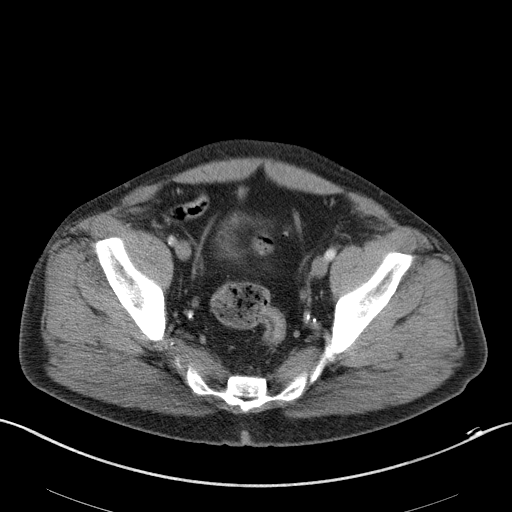
[im 31/93  soft-tissue]
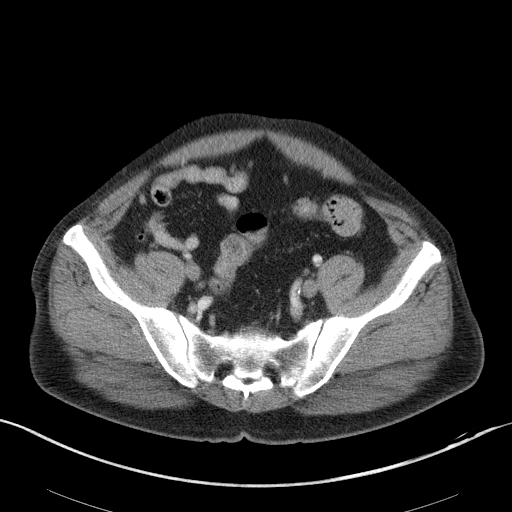
[im 41/93  soft-tissue]
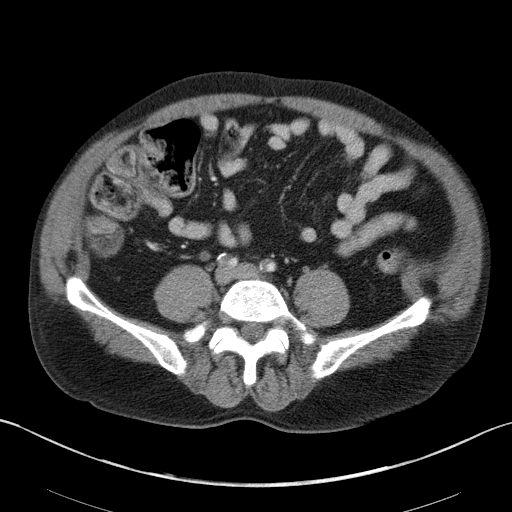
[im 47/93  soft-tissue]
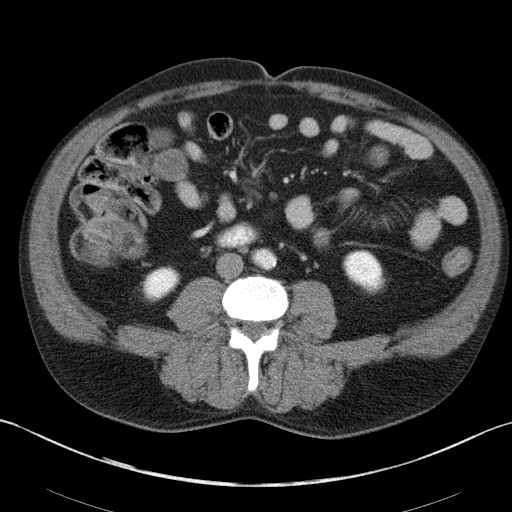
[im 52/93  soft-tissue]
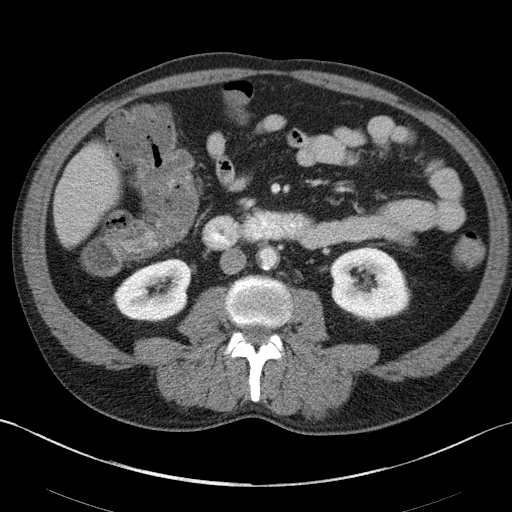
[im 62/93  soft-tissue]
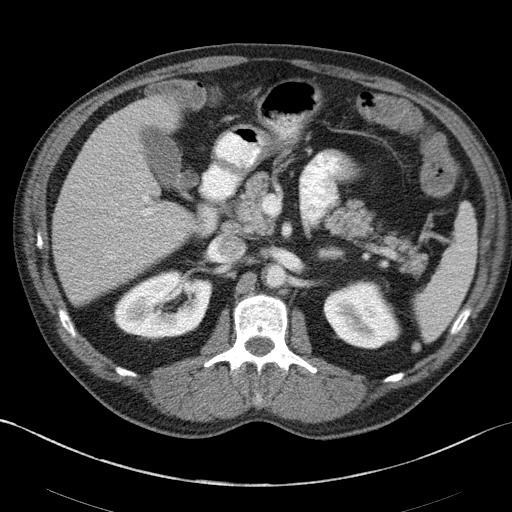
[im 62/93  bone]
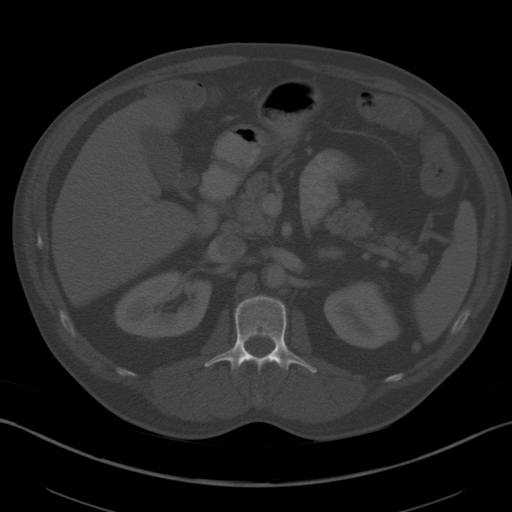
[im 67/93  soft-tissue]
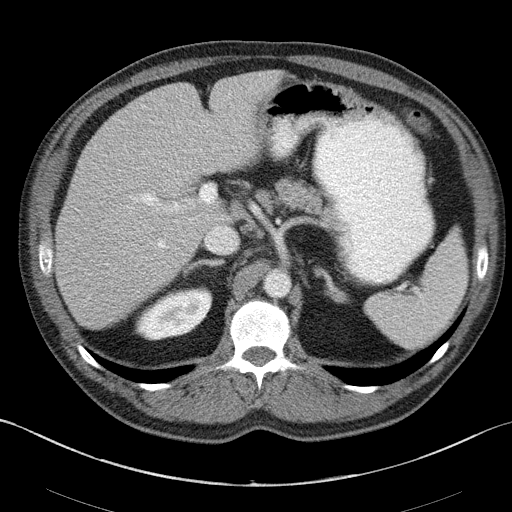
[im 72/93  soft-tissue]
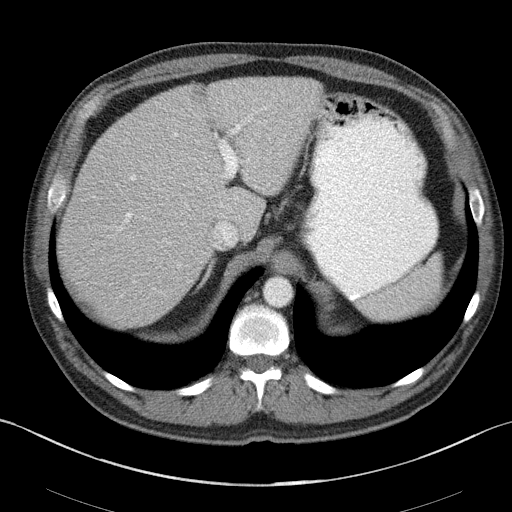
[im 82/93  soft-tissue]
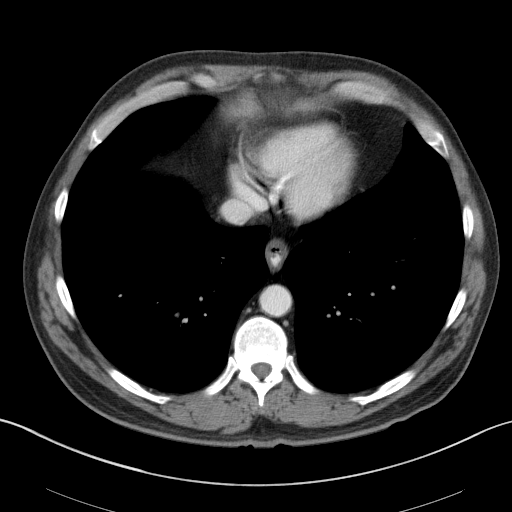
[im 87/93  soft-tissue]
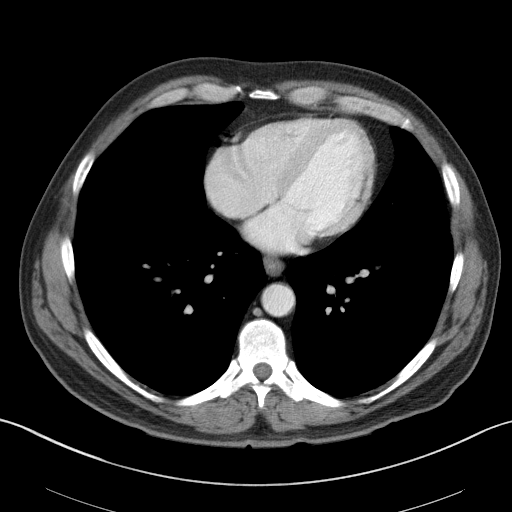

[Series 5: abd/pelvis 3.0 coronal · coronal · 0.77mm/px · 3 of 90 slices shown]
[im 30/90  soft-tissue]
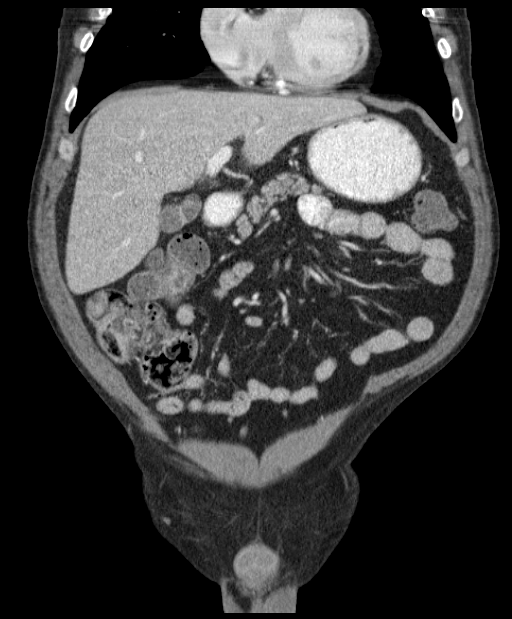
[im 40/90  soft-tissue]
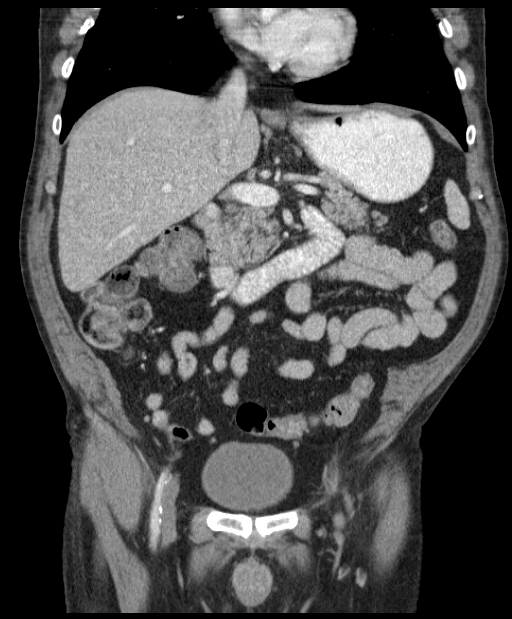
[im 50/90  soft-tissue]
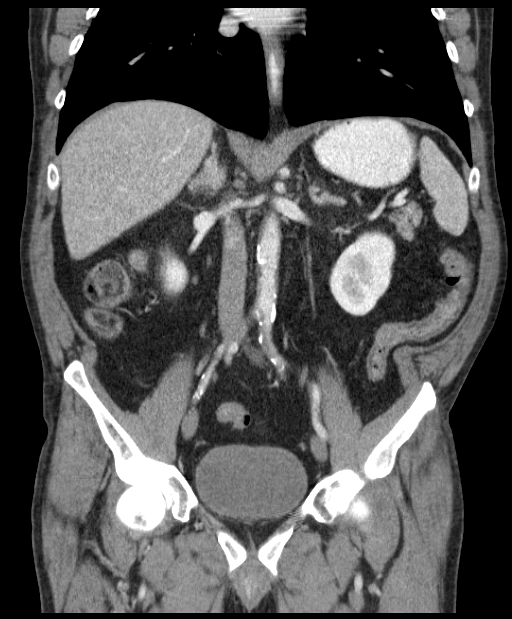

[16 of 46 positions shown; findings below may reference images not displayed]

FINDINGS: Lower chest: Motion artifact at the lung bases is present. Lung
bases are clear.

Hepatobiliary: Presumed focal fat within the medial segment left
hepatic lobe image 22. Liver is otherwise unremarkable. Gallbladder
is normal.

Pancreas: Normal

Spleen: Normal

Adrenals/Urinary Tract: Adrenal glands and kidneys are unremarkable.
Bladder is unremarkable. No radiopaque ureteral calculus.

Stomach/Bowel: The appendix is normal. No bowel wall thickening or
focal segmental dilatation is identified.

Vascular/Lymphatic: 1 cm gastrohepatic node identified image 27.
Small retroperitoneal nodes are identified within normal limits for
size and appearance. Moderate atheromatous aortic calcification
without aneurysm.

Other: No free air or fluid.

Musculoskeletal: No acute osseous abnormality.
IMPRESSION: No acute intra-abdominal or pelvic pathology.

## 2017-02-22 ENCOUNTER — Inpatient Hospital Stay: Admit: 2017-02-22 | Discharge: 2017-02-22 | Disposition: A | Discharge status: Home / Self Care | Payer: MEDICAID

## 2017-02-22 DIAGNOSIS — G8929 Other chronic pain: Secondary | ICD-10-CM

## 2017-02-22 MED ORDER — CYCLOBENZAPRINE HCL 10 MG PO TABS
10 MG | ORAL_TABLET | Freq: Three times a day (TID) | ORAL | 0 refills | Status: DC | PRN
Start: 2017-02-22 — End: 2017-07-19

## 2017-02-22 MED ORDER — GABAPENTIN 300 MG PO CAPS
300 MG | ORAL_CAPSULE | Freq: Three times a day (TID) | ORAL | 0 refills | Status: DC
Start: 2017-02-22 — End: 2017-09-08

## 2017-02-22 MED ORDER — DULOXETINE HCL 30 MG PO CPEP
30 MG | ORAL_CAPSULE | Freq: Every day | ORAL | 0 refills | Status: DC
Start: 2017-02-22 — End: 2017-09-08

## 2017-02-22 MED ORDER — GABAPENTIN 300 MG PO CAPS
300 MG | Freq: Once | ORAL | Status: AC
Start: 2017-02-22 — End: 2017-02-22
  Administered 2017-02-22: 23:00:00 300 mg via ORAL

## 2017-02-22 MED ORDER — HYDROCODONE-ACETAMINOPHEN 7.5-325 MG PO TABS
Freq: Once | ORAL | Status: AC
Start: 2017-02-22 — End: 2017-02-22
  Administered 2017-02-22: 22:00:00 1 via ORAL

## 2017-02-22 MED FILL — GABAPENTIN 300 MG PO CAPS: 300 mg | ORAL | Qty: 1

## 2017-02-22 MED FILL — HYDROCODONE-ACETAMINOPHEN 7.5-325 MG PO TABS: 7.5-325 mg | ORAL | Qty: 1

## 2017-02-22 NOTE — ED Triage Notes (Signed)
Pt presents to the ED with c/o generalized body aches and pain for several weeks. Pt reports recently moving here and has not been able to sit up a PCP to see for chronic issues and medication refills.

## 2017-02-22 NOTE — ED Notes (Signed)
Discharge instructions reviewed. Pt verbalized understanding.       Abran Richard, RN  02/22/17 1810

## 2017-02-22 NOTE — ED Notes (Signed)
Pt. States he lost his housing and had to emergently moved to [facility] but was unable to pick up his prescriptions before the move because they were not due to refill at the time. Pt. States hx of diabetes, diabetic neuropathy, scoliosis, and chronic pain that he treats with heating pads. Pt. States that he was seeing a pain clinic in [place].      Abran Richard, RN  02/22/17 [phone]

## 2017-02-22 NOTE — Discharge Instructions (Signed)
New Providers in the [place] area        Dr. Emdad Ali Dr. Khadija Ahemd  [facility]  [facility]  [address] [place] [address]  [place] [address] [phone]  [phone]    Dr. Aisha Gargom Amanda Bensman, NP  [facility]  [address] [place] [address] [place] [address]  [phone]    Amanda Myers, NP Laura Willis, NP  [facility]  [address] [place] [address] [place] [address]  [phone]

## 2017-02-22 NOTE — ED Provider Notes (Signed)
eMERGENCY dEPARTMENT eNCOUnter      PCP: No primary care provider on file.    CHIEF COMPLAINT    Chief Complaint   Patient presents with   . Generalized Body Aches     Generalized aches and pains for a few weeks        HPI    Ricardo Foley is a 57 y.o. male who presents With worsening chronic, generalized pain, needing medication refill as well as establishing care. Patient states that he moved from [place] 2-3 weeks ago and has not had several of his medications since this move. Patient states he has a history of chronic pain and was being seen at a pain clinic in [place]. He also admits to history of diabetes, states that he has diabetic neuropathy with tingling in his legs and toes. He denies any new symptoms from his baseline, believes there is just been exacerbated as he has not had this medication. He was previously on Norco 7.5, Flexeril, Neurontin, Cymbalta. He is also on metformin and Lipitor which he states he still has a refill of.        REVIEW OF SYSTEMS    Constitutional:  Denies fever, chills, weight loss or weakness   HENT:  Denies sore throat or ear pain   Cardiovascular:  Denies chest pain, palpitations   Respiratory:  Denies cough or shortness of breath    GI:  Denies abdominal pain, nausea, vomiting, or diarrhea  GU:  Denies any urinary symptoms    Musculoskeletal: + diffuse back pain, arm pain and leg pain  Skin:  Denies rash  Neurologic:  Denies headache, focal weakness or sensory changes   Endocrine:  Denies polyuria or polydypsia   Lymphatic:  Denies swollen glands     All other review of systems are negative  See HPI and nursing notes for additional information     PAST MEDICAL AND SURGICAL HISTORY    Past Medical History:   Diagnosis Date   . CAD (coronary artery disease)    . Diabetes ([place])    . Hyperlipidemia    . Hypertension    . Nerve pain    . Scoliosis      Past Surgical History:   Procedure Laterality Date   . PR SONO GUIDE NEEDLE BIOPSY         CURRENT MEDICATIONS    Current  Outpatient Rx   Medication Sig Dispense Refill   . metFORMIN (GLUCOPHAGE) 1000 MG tablet Take 1,000 mg by mouth 2 times daily (with meals)     . hydrOXYzine (VISTARIL) 50 MG capsule Take 50 mg by mouth 3 times daily as needed for Itching     . pantoprazole sodium (PROTONIX) 40 MG PACK packet Take 40 mg by mouth every morning (before breakfast)     . omeprazole (PRILOSEC) 20 MG delayed release capsule Take 20 mg by mouth daily     . atorvastatin (LIPITOR) 20 MG tablet Take 20 mg by mouth daily     . cyclobenzaprine (FLEXERIL) 10 MG tablet Take 1 tablet by mouth 3 times daily as needed for Muscle spasms 20 tablet 0   . DULoxetine (CYMBALTA) 30 MG extended release capsule Take 1 capsule by mouth daily 30 capsule 0   . gabapentin (NEURONTIN) 300 MG capsule Take 1 capsule by mouth 3 times daily for 10 days.. 30 capsule 0       ALLERGIES    No Known Allergies    SOCIAL AND FAMILY HISTORY  Social History     Social History   . Marital status: N/A     Spouse name: N/A   . Number of children: N/A   . Years of education: N/A     Social History Main Topics   . Smoking status: Current Every Day Smoker     Packs/day: 1.00     Types: Cigarettes   . Smokeless tobacco: Never Used   . Alcohol use No   . Drug use: Yes     Frequency: 3.0 times per week     Types: Marijuana   . Sexual activity: Not Asked     Other Topics Concern   . None     Social History Narrative   . None     History reviewed. No pertinent family history.      PHYSICAL EXAM    VITAL SIGNS: BP 139/68   Pulse 94   Temp 97.8 F (36.6 C) (Oral)   Resp 20   Ht 5\' 6"  (1.676 m)   Wt 192 lb (87.1 kg)   SpO2 97%   BMI 30.99 kg/m    Constitutional:  Well developed, Well nourished  HENT:  Normocephalic, Atraumatic, PERRL.  EOMI.  Sclera clear.Conjunctiva normal, No discharge.  Neck/Lymphatics: supple, no JVD, no swollen nodes  Cardiovascular:  Normal heart rate, Normal rhythm, No murmurs  Respiratory:  Nonlabored breathing.  Normal breath sounds, No  wheezing  Abdomen:  Bowel sounds normal, Soft, No tenderness, no masses.  Musculoskeletal: No edema, No cyanosis  Integument:  Warm, Dry  Neurologic: Alert & oriented , No focal deficits noted.   Cranial nerves II through XII grossly intact.   Normal gross motor coordination & motor strength bilateral upper and lower extremities  Sensation intact.  Psychiatric:  Affect normal, Mood normal.       ED COURSE & MEDICAL DECISION MAKING       Vital signs and nursing notes reviewed during ED course.  I have independently evaluated this patient .  Supervising MD present in the Emergency Department, available for consultation, throughout entirety of  patient care.     Patient presents as above. He is hemodynamically stable, states all of his pain is chronic, however seems to be acutely exacerbated as he has been out of his medications since moving to the area. I discussed with patient that we will facilitate him establishing care here in [place], however filling controlled substances is inappropriately emergency department. He is given dose here in the ED. We'll discharge with short refills of Flexeril, Cymbalta and Neurontin. Case management is consulted who facilitates arranging follow-up as well as financial assistance in filling above medications.     The patient and/or the family were informed of the results of any tests/labs/imaging, the treatment plan, and time was allotted to answer questions.         Clinical  IMPRESSION    1. Encounter for medication refill    2. Other chronic pain          Comment: Please note this report has been produced using speech recognition software and may contain errors related to that system including errors in grammar, punctuation, and spelling, as well as words and phrases that may be inappropriate. If there are any questions or concerns please feel free to contact the dictating provider for clarification.         Remonia Richter, PA  02/22/17 [phone]

## 2017-02-22 NOTE — Care Coordination-Inpatient (Signed)
Consult request per Ricardo Foley, [place] for medication assistance.   LSW and Jess, SW student, met with pt to initiate discharge planning. Pt recently moved to [place] from [place] and has been staying with family. Pt reports that he is has not income and is waiting on SS payments. Pt reports that he started process of applying for medicaid in [facility]; has made attempts to cancel benefits in [place]. Additionally, pt does not have PCP. LSW offered PCP list, however, pt stated that someone already stated they would bring it to him.  Pt was given Rx for Flexeril, Neurontin, and Cymbalta.   5:51p-Call to Jill[facility]. Per Sharee Pimple, due to the cost of these prescriptions, pt will need to go to the [facility] office tomorrow for assistance.  LSW gave pt location/contact information for [facility] office, and instructions to go during their walk-in hours with photo ID. Pt stated that he has transportation     Update given to [facility] regarding consult request. Pt will be discharged with no other needs identified.

## 2017-03-13 LAB — COMPREHENSIVE METABOLIC PANEL
ALT: 16 U/L (ref 5–41)
AST: 13 U/L (ref <40)
Albumin/Globulin Ratio: 1.9 (ref 1.0–2.5)
Albumin: 4.3 g/dL (ref 3.5–5.2)
Alkaline Phosphatase: 64 U/L (ref 40–129)
Anion Gap: 14 mmol/L (ref 9–17)
BUN: 13 mg/dL (ref 6–20)
CO2: 26 mmol/L (ref 20–31)
Calcium: 9.4 mg/dL (ref 8.6–10.4)
Chloride: 101 mmol/L (ref 98–107)
Creatinine: 0.8 mg/dL (ref 0.70–1.20)
GFR African American: >60 mL/min (ref >60)
GFR Non-African American: >60 mL/min (ref >60)
Glucose: 129 mg/dL — ABNORMAL HIGH (ref 70–99)
Potassium: 4.3 mmol/L (ref 3.7–5.3)
Sodium: 141 mmol/L (ref 135–144)
Total Bilirubin: 0.18 mg/dL — ABNORMAL LOW (ref 0.3–1.2)
Total Protein: 6.6 g/dL (ref 6.4–8.3)

## 2017-03-13 LAB — LIPID PANEL
Chol/HDL Ratio: 2.7 (ref <5)
Cholesterol: 115 mg/dL (ref <200)
HDL: 43 mg/dL (ref >40)
LDL Cholesterol: 35 mg/dL (ref 0–130)
Triglycerides: 186 mg/dL — ABNORMAL HIGH (ref <150)

## 2017-03-13 LAB — CBC WITH AUTO DIFFERENTIAL
Absolute Eos #: 0.58 10*3/uL — ABNORMAL HIGH (ref 0.0–0.4)
Absolute Immature Granulocyte: 0 10*3/uL (ref 0.00–0.30)
Absolute Lymph #: 10.67 10*3/uL — ABNORMAL HIGH (ref 1.0–4.8)
Absolute Mono #: 0.78 10*3/uL (ref 0.1–0.8)
Basophils Absolute: 0 10*3/uL (ref 0.0–0.2)
Basophils: 0 % (ref 0–2)
Eosinophils %: 3 % (ref 1–4)
Hematocrit: 39.2 % — ABNORMAL LOW (ref 40.7–50.3)
Hemoglobin: 12.5 g/dL — ABNORMAL LOW (ref 13.0–17.0)
Immature Granulocytes: 0 %
Lymphocytes: 55 % — ABNORMAL HIGH (ref 24–44)
MCH: 27.8 pg (ref 25.2–33.5)
MCHC: 31.9 g/dL (ref 28.4–34.8)
MCV: 87.1 fL (ref 82.6–102.9)
MPV: 10.6 fL (ref 8.1–13.5)
Monocytes: 4 % (ref 1–7)
NRBC Automated: 0 per 100 WBC
Platelets: 495 10*3/uL — ABNORMAL HIGH (ref 138–453)
RBC: 4.5 m/uL (ref 4.21–5.77)
RDW: 16.6 % — ABNORMAL HIGH (ref 11.8–14.4)
Seg Neutrophils: 38 % (ref 36–66)
Segs Absolute: 7.37 10*3/uL (ref 1.8–7.7)
WBC: 19.4 10*3/uL — ABNORMAL HIGH (ref 3.5–11.3)

## 2017-03-13 LAB — TSH: TSH: 1.42 mIU/L (ref 0.30–5.00)

## 2017-03-13 LAB — T4, FREE: Thyroxine, Free: 1.41 ng/dL (ref 0.93–1.70)

## 2017-05-11 ENCOUNTER — Emergency Department: Admit: 2017-05-11 | Payer: PRIVATE HEALTH INSURANCE | Primary: Family

## 2017-05-11 ENCOUNTER — Inpatient Hospital Stay
Admit: 2017-05-11 | Discharge: 2017-05-11 | Disposition: A | Discharge status: Home / Self Care | Payer: PRIVATE HEALTH INSURANCE

## 2017-05-11 DIAGNOSIS — K5730 Diverticulosis of large intestine without perforation or abscess without bleeding: Secondary | ICD-10-CM

## 2017-05-11 LAB — CBC WITH AUTO DIFFERENTIAL
Basophils %: 0.6 % (ref 0–1)
Basophils Absolute: 0.1 10*3/uL
Eosinophils %: 2.5 % (ref 0–3)
Eosinophils Absolute: 0.5 10*3/uL
Hematocrit: 41.4 % — ABNORMAL LOW (ref 42–52)
Hemoglobin: 12.8 GM/DL — ABNORMAL LOW (ref 13.5–18.0)
Immature Neutrophil %: 0.6 % — ABNORMAL HIGH (ref 0–0.43)
Lymphocytes %: 44.6 % — ABNORMAL HIGH (ref 24–44)
Lymphocytes Absolute: 8.7 10*3/uL
MCH: 26.8 PG — ABNORMAL LOW (ref 27–31)
MCHC: 30.9 % — ABNORMAL LOW (ref 32.0–36.0)
MCV: 86.8 FL (ref 78–100)
MPV: 9.7 FL (ref 7.5–11.1)
Monocytes %: 4.8 % — ABNORMAL HIGH (ref 0–4)
Monocytes Absolute: 0.9 10*3/uL
Nucleated RBC %: 0 %
Platelets: 457 10*3/uL — ABNORMAL HIGH (ref 140–440)
RBC: 4.77 10*6/uL (ref 4.6–6.2)
RDW: 16.4 % — ABNORMAL HIGH (ref 11.7–14.9)
Segs Absolute: 9.2 10*3/uL
Segs Relative: 46.9 % (ref 36–66)
Total Immature Neutrophil: 0.12 10*3/uL
Total Nucleated RBC: 0 10*3/uL
WBC: 19.5 10*3/uL — ABNORMAL HIGH (ref 4.0–10.5)

## 2017-05-11 LAB — URINALYSIS
Bacteria, UA: NEGATIVE /HPF
Bilirubin Urine: NEGATIVE MG/DL
Blood, Urine: NEGATIVE
Glucose, Urine: NEGATIVE MG/DL
Leukocyte Esterase, Urine: NEGATIVE
Nitrite Urine, Quantitative: NEGATIVE
Protein, UA: NEGATIVE MG/DL
RBC, UA: NONE SEEN /HPF (ref 0–3)
Specific Gravity, UA: 1.025 (ref 1.001–1.035)
Trichomonas, UA: NONE SEEN /HPF
Urobilinogen, Urine: NORMAL MG/DL (ref 0.2–1.0)
WBC, UA: <1 /HPF (ref 0–2)
pH, Urine: 5 (ref 5.0–8.0)

## 2017-05-11 LAB — COMPREHENSIVE METABOLIC PANEL
ALT: 26 U/L (ref 10–40)
AST: 16 IU/L (ref 15–37)
Albumin: 4.7 GM/DL (ref 3.4–5.0)
Alkaline Phosphatase: 70 IU/L (ref 40–129)
Anion Gap: 12 (ref 4–16)
BUN: 12 MG/DL (ref 6–23)
CO2: 28 MMOL/L (ref 21–32)
Calcium: 9.4 MG/DL (ref 8.3–10.6)
Chloride: 99 mMol/L (ref 99–110)
Creatinine: 0.9 MG/DL (ref 0.9–1.3)
GFR African American: >60 mL/min/{1.73_m2} (ref >60)
GFR Non-African American: >60 mL/min/{1.73_m2} (ref >60)
Glucose: 167 MG/DL — ABNORMAL HIGH (ref 70–99)
Potassium: 4.4 MMOL/L (ref 3.5–5.1)
Sodium: 139 MMOL/L (ref 135–145)
Total Bilirubin: 0.2 MG/DL (ref 0.0–1.0)
Total Protein: 7.4 GM/DL (ref 6.4–8.2)

## 2017-05-11 LAB — LIPASE: Lipase: 35 IU/L (ref 13–60)

## 2017-05-11 MED ORDER — MORPHINE SULFATE (PF) 4 MG/ML IJ SOLN
4 MG/ML | Freq: Once | INTRAMUSCULAR | Status: AC
Start: 2017-05-11 — End: 2017-05-11
  Administered 2017-05-11: 19:00:00 4 mg via INTRAVENOUS

## 2017-05-11 MED ORDER — IOPAMIDOL 76 % IV SOLN
76 % | Freq: Once | INTRAVENOUS | Status: AC | PRN
Start: 2017-05-11 — End: 2017-05-11
  Administered 2017-05-11: 20:00:00 75 mL via INTRAVENOUS

## 2017-05-11 MED ORDER — ONDANSETRON HCL 4 MG/2ML IJ SOLN
4 MG/2ML | Freq: Once | INTRAMUSCULAR | Status: AC
Start: 2017-05-11 — End: 2017-05-11
  Administered 2017-05-11: 19:00:00 4 mg via INTRAVENOUS

## 2017-05-11 MED ORDER — ONDANSETRON 4 MG PO TBDP
4 MG | ORAL_TABLET | Freq: Four times a day (QID) | ORAL | 0 refills | Status: DC
Start: 2017-05-11 — End: 2017-06-02

## 2017-05-11 MED ORDER — SODIUM CHLORIDE 0.9 % IV BOLUS
0.9 % | Freq: Once | INTRAVENOUS | Status: DC
Start: 2017-05-11 — End: 2017-05-11

## 2017-05-11 MED ORDER — NORMAL SALINE FLUSH 0.9 % IV SOLN
0.9 % | INTRAVENOUS | Status: DC | PRN
Start: 2017-05-11 — End: 2017-05-11
  Administered 2017-05-11: 20:00:00 10 mL via INTRAVENOUS

## 2017-05-11 MED ORDER — DICYCLOMINE HCL 10 MG PO CAPS
10 MG | ORAL_CAPSULE | Freq: Four times a day (QID) | ORAL | 0 refills | Status: DC
Start: 2017-05-11 — End: 2017-06-02

## 2017-05-11 MED ORDER — KETOROLAC TROMETHAMINE 30 MG/ML IJ SOLN
30 MG/ML | Freq: Once | INTRAMUSCULAR | Status: AC
Start: 2017-05-11 — End: 2017-05-11
  Administered 2017-05-11: 19:00:00 30 mg via INTRAVENOUS

## 2017-05-11 MED FILL — KETOROLAC TROMETHAMINE 30 MG/ML IJ SOLN: 30 mg/mL | INTRAMUSCULAR | Qty: 1

## 2017-05-11 MED FILL — MORPHINE SULFATE 4 MG/ML IJ SOLN: 4 mg/mL | INTRAMUSCULAR | Qty: 1

## 2017-05-11 MED FILL — ISOVUE-370 76 % IV SOLN: 76 % | INTRAVENOUS | Qty: 100

## 2017-05-11 MED FILL — ONDANSETRON HCL 4 MG/2ML IJ SOLN: 4 MG/2ML | INTRAMUSCULAR | Qty: 2

## 2017-05-11 NOTE — ED Notes (Signed)
CT WAITING ON LABS/IV

## 2017-05-11 NOTE — ED Notes (Signed)
Discharge instructions reviewed with patient. Patient verbalized understanding.       Cassandria Anger, RN  05/11/17 [phone]

## 2017-05-11 NOTE — ED Notes (Signed)
CT WAITING ON IV DOCUMENTATION

## 2017-05-11 NOTE — ED Provider Notes (Signed)
eMERGENCY dEPARTMENT eNCOUnter      PCP: No primary care provider on file.    CHIEF COMPLAINT    Chief Complaint   Patient presents with   . Abdominal Pain   . Nausea   . Emesis       HPI    Ricardo Foley is a 57 y.o. male with past medical history pancreatitis, IBS, diverticulosis, kidney stones who presents with abdominal pain.  Symptoms began 5 days ago and have been constant since onset with varying severity.  He admits to associated nausea and nonbloody emesis.  He states pain seems to worsen following urination and bowel movements.  He denies any associated diarrhea.  Pain is most significant left lower aspect of abdomen.  No fevers or chills.  He admits to mild dysuria, no hematuria.  No associated chest pain or shortness of breath.  He denies any previous abdominal surgeries        REVIEW OF SYSTEMS    Constitutional:  Denies fever, chills, weight loss or weakness   HENT:  Denies sore throat or ear pain   Cardiovascular:  Denies chest pain, palpitations or swelling   Respiratory:  Denies cough or shortness of breath   GI:  See HPI above  GU: No hematuria, +dysuria.     Musculoskeletal:  Denies back pain or groin pain or masses.  No pain or swelling of extremities.  Skin:  Denies rash  Neurologic:  Denies headache, focal weakness or sensory changes   Endocrine:  Denies polyuria or polydypsia   Lymphatic:  Denies swollen glands     All other review of systems are negative  See HPI and nursing notes for additional information     PAST MEDICAL & SURGICAL HISTORY    Past Medical History:   Diagnosis Date   . CAD (coronary artery disease)    . Diabetes (H[facility])    . Hyperlipidemia    . Hypertension    . Nerve pain    . Scoliosis      Past Surgical History:   Procedure Laterality Date   . PR SONO GUIDE NEEDLE BIOPSY         CURRENT MEDICATIONS    Current Outpatient Rx   Medication Sig Dispense Refill   . dicyclomine (BENTYL) 10 MG capsule Take 2 capsules by mouth 4 times daily (before meals and nightly) 30 capsule 0    . ondansetron (ZOFRAN ODT) 4 MG disintegrating tablet Take 1 tablet by mouth every 6 hours 10 tablet 0   . metFORMIN (GLUCOPHAGE) 1000 MG tablet Take 1,000 mg by mouth 2 times daily (with meals)     . hydrOXYzine (VISTARIL) 50 MG capsule Take 50 mg by mouth 3 times daily as needed for Itching     . pantoprazole sodium (PROTONIX) 40 MG PACK packet Take 40 mg by mouth every morning (before breakfast)     . omeprazole (PRILOSEC) 20 MG delayed release capsule Take 20 mg by mouth daily     . atorvastatin (LIPITOR) 20 MG tablet Take 20 mg by mouth daily     . cyclobenzaprine (FLEXERIL) 10 MG tablet Take 1 tablet by mouth 3 times daily as needed for Muscle spasms 20 tablet 0   . DULoxetine (CYMBALTA) 30 MG extended release capsule Take 1 capsule by mouth daily 30 capsule 0   . gabapentin (NEURONTIN) 300 MG capsule Take 1 capsule by mouth 3 times daily for 10 days.. 30 capsule 0       ALLERGIES  No Known Allergies    SOCIAL AND FAMILY HISTORY    Social History     Socioeconomic History   . Marital status: Single     Spouse name: None   . Number of children: None   . Years of education: None   . Highest education level: None   Occupational History   . None   Social Needs   . Financial resource strain: None   . Food insecurity:     Worry: None     Inability: None   . Transportation needs:     Medical: None     Non-medical: None   Tobacco Use   . Smoking status: Current Every Day Smoker     Packs/day: 1.00     Types: Cigarettes   . Smokeless tobacco: Never Used   Substance and Sexual Activity   . Alcohol use: No   . Drug use: Yes     Frequency: 3.0 times per week     Types: Marijuana   . Sexual activity: None   Lifestyle   . Physical activity:     Days per week: None     Minutes per session: None   . Stress: None   Relationships   . Social connections:     Talks on phone: None     Gets together: None     Attends religious service: None     Active member of club or organization: None     Attends meetings of clubs or  organizations: None     Relationship status: None   . Intimate partner violence:     Fear of current or ex partner: None     Emotionally abused: None     Physically abused: None     Forced sexual activity: None   Other Topics Concern   . None   Social History Narrative   . None     History reviewed. No pertinent family history.    PHYSICAL EXAM    VITAL SIGNS: BP (!) 140/73   Pulse 82   Temp 97.9 F (36.6 C) (Oral)   Resp 16   Ht 5\' 6"  (1.676 m)   Wt 189 lb (85.7 kg)   SpO2 98%   BMI 30.51 kg/m   Constitutional:  Well developed, well nourished.  No distress  Eyes:  Sclera nonicteric, conjunctiva moist  HENT:  Atraumatic.  PERRL.  EOMI. Moist mucus membranes.  Neck/Lymphatics: supple, no JVD, no swollen nodes  Respiratory:  No retractions, no accessory muscle use, normal breath sounds   Cardiovascular:   normal rate, normal rhythm, no murmurs    GI:     No gross discoloration.       -no Cullen's sign (periumbilical ecchymosis)       -no Grey-Turner's sign (flank ecchymosis)  Bowel sounds present, no audible bruits. Soft,  no distention, no guarding, no rigidity,   + left lower quadrant, diffuse lower abdominal tenderness, no rebound tenderness, no palpable pulsatile masses,   No McBurney's point tenderness   Negative Rovsing sign    Negative Murphy's sign.    Back:   No CVA tenderness to percussion.  Musculoskeletal:  No edema, no deformity  Vascular: DP pulses 2+ equal bilaterally  Integument: No rash, dry skin  Neurologic:  Alert & oriented, normal speech  Psychiatric: Cooperative, pleasant affect       LABS:  Results for orders placed or performed during the hospital encounter of 05/11/17   CMP   Result  Value Ref Range    Sodium 139 135 - 145 MMOL/L    Potassium 4.4 3.5 - 5.1 MMOL/L    Chloride 99 99 - 110 mMol/L    CO2 28 21 - 32 MMOL/L    BUN 12 6 - 23 MG/DL    CREATININE 0.9 0.9 - 1.3 MG/DL    Glucose 295 (H) 70 - 99 MG/DL    Calcium 9.4 8.3 - 62.1 MG/DL    Alb 4.7 3.4 - 5.0 GM/DL    Total Protein 7.4  6.4 - 8.2 GM/DL    Total Bilirubin 0.2 0.0 - 1.0 MG/DL    ALT 26 10 - 40 U/L    AST 16 15 - 37 IU/L    Alkaline Phosphatase 70 40 - 129 IU/L    GFR Non-African American >60 >60 mL/min/1.46m2    GFR African American >60 >60 mL/min/1.58m2    Anion Gap 12 4 - 16   CBC auto diff   Result Value Ref Range    WBC 19.5 (H) 4.0 - 10.5 K/CU MM    RBC 4.77 4.6 - 6.2 M/CU MM    Hemoglobin 12.8 (L) 13.5 - 18.0 GM/DL    Hematocrit 30.8 (L) 42 - 52 %    MCV 86.8 78 - 100 FL    MCH 26.8 (L) 27 - 31 PG    MCHC 30.9 (L) 32.0 - 36.0 %    RDW 16.4 (H) 11.7 - 14.9 %    Platelets 457 (H) 140 - 440 K/CU MM    MPV 9.7 7.5 - 11.1 FL    Differential Type AUTOMATED DIFFERENTIAL     Segs Relative 46.9 36 - 66 %    Lymphocytes % 44.6 (H) 24 - 44 %    Monocytes % 4.8 (H) 0 - 4 %    Eosinophils % 2.5 0 - 3 %    Basophils % 0.6 0 - 1 %    Segs Absolute 9.2 K/CU MM    Lymphocytes # 8.7 K/CU MM    Monocytes # 0.9 K/CU MM    Eosinophils # 0.5 K/CU MM    Basophils # 0.1 K/CU MM    Nucleated RBC % 0.0 %    Total Nucleated RBC 0.0 K/CU MM    Total Immature Neutrophil 0.12 K/CU MM    Immature Neutrophil % 0.6 (H) 0 - 0.43 %   Lipase   Result Value Ref Range    Lipase 35 13 - 60 IU/L   Urinalysis   Result Value Ref Range    Color, UA YELLOW YELLOW    Clarity, UA CLEAR CLEAR    Glucose, Urine NEGATIVE NEGATIVE MG/DL    Bilirubin Urine NEGATIVE NEGATIVE MG/DL    Ketones, Urine SMALL (A) NEGATIVE MG/DL    Specific Gravity, UA 1.025 1.001 - 1.035    Blood, Urine NEGATIVE NEGATIVE    pH, Urine 5.0 5.0 - 8.0    Protein, UA NEGATIVE NEGATIVE MG/DL    Urobilinogen, Urine NORMAL 0.2 - 1.0 MG/DL    Nitrite Urine, Quantitative NEGATIVE NEGATIVE    Leukocyte Esterase, Urine NEGATIVE NEGATIVE    RBC, UA NONE SEEN 0 - 3 /HPF    WBC, UA <1 0 - 2 /HPF    Bacteria, UA NEGATIVE NEGATIVE /HPF    Mucus, UA OCCASIONAL (A) NEGATIVE HPF    Trichomonas, UA NONE SEEN NONE SEEN /HPF           RADIOLOGY/PROCEDURES    CT ABDOMEN  PELVIS W IV CONTRAST   Final Result   No acute  abdominopelvic abnormality detected.      Diverticulosis coli without evidence of diverticulitis or colitis.                ED COURSE & MEDICAL DECISION MAKING       Vital signs and nursing notes reviewed during ED course.  I have independently evaluated this patient .  Supervising MD present in the Emergency Department, available for consultation, throughout entirety of  patient care.     Patient presents as above.  On arrival, patient is hemodynamically stable, afebrile.  Abdomen soft.  He is given a dose pain medication, antiemetic.  Labs and imaging obtained.   Labwork with leukocytosis of 19.5.  On chart review, patient has had similar white blood cell count in the past.  Normal lipase.  Urinalysis without evidence of infection or red blood cells.  With concern for diverticulitis versus kidney stone versus pancreatitis, vs other emergent intraabdominal etiology, imaging of the abdomen also obtained.  CT abdomen and pelvis without acute abnormality, diverticulosis without evidence of diverticulitis or colitis noted.   Above labs and imaging discussed with patient.  Patient has had mild improvement of symptoms following that pain medications in the ED.  Repeat abdominal exam remains nonsurgical.  At this time, unclear etiology of symptoms, however.  Be emergent etiology based on exam and workup today.  We discussed repeating abdominal exam of the next day and return immediately with any acutely worsening symptoms of this may be an early presentation of an emergent process.  At this time, likely further emergent etiology is insufficient justify testing to rule this out.  We'll give him GI follow up as he has had long-term GI symptoms   Diagnosis, disposition, and treatment plan reviewed with patient and/or family who understands and agrees. Patient understands and agrees to follow up with PCP in 1 day. Patient understands and agrees to return to the emergency department for any new or worsening  symptoms.        Clinical  IMPRESSION    1. Abdominal pain, left lower quadrant    2. Non-intractable vomiting with nausea, unspecified vomiting type    3. Leukocytosis, unspecified type        Disposition referral (if applicable):  Jacklynn Barnacle, MD  [address]  [place] [address]  [phone]    Schedule an appointment as soon as possible for a visit in 1 day  for GI follow up    [facility] Emergency Department  [address]  [place] [address]  [phone]  Go to   As needed, If symptoms worsen      Disposition medications (if applicable):  Discharge Medication List as of 05/11/2017  4:58 PM      START taking these medications    Details   dicyclomine (BENTYL) 10 MG capsule Take 2 capsules by mouth 4 times daily (before meals and nightly), Disp-30 capsule, R-0Print      ondansetron (ZOFRAN ODT) 4 MG disintegrating tablet Take 1 tablet by mouth every 6 hours, Disp-10 tablet, R-0Print               Comment: Please note this report has been produced using speech recognition software and may contain errors related to that system including errors in grammar, punctuation, and spelling, as well as words and phrases that may be inappropriate. If there are any questions or concerns please  feel free to contact the dictating provider for clarification.        Julieanne Manson, PA  05/11/17 2017

## 2017-05-30 ENCOUNTER — Inpatient Hospital Stay
Admit: 2017-05-30 | Discharge: 2017-06-02 | Disposition: A | Discharge status: Home / Self Care | Payer: PRIVATE HEALTH INSURANCE | Source: Ambulatory Visit | Admitting: Hospitalist

## 2017-05-30 ENCOUNTER — Emergency Department: Admit: 2017-05-31 | Payer: PRIVATE HEALTH INSURANCE | Primary: Family

## 2017-05-30 ENCOUNTER — Emergency Department: Admit: 2017-05-30 | Payer: PRIVATE HEALTH INSURANCE | Primary: Family

## 2017-05-30 DIAGNOSIS — R0789 Other chest pain: Secondary | ICD-10-CM

## 2017-05-30 LAB — COMPREHENSIVE METABOLIC PANEL
ALT: 27 U/L (ref 10–40)
AST: 15 IU/L (ref 15–37)
Albumin: 4.5 GM/DL (ref 3.4–5.0)
Alkaline Phosphatase: 60 IU/L (ref 40–129)
Anion Gap: 12 (ref 4–16)
BUN: 17 MG/DL (ref 6–23)
CO2: 27 MMOL/L (ref 21–32)
Calcium: 9.2 MG/DL (ref 8.3–10.6)
Chloride: 99 mMol/L (ref 99–110)
Creatinine: 1.2 MG/DL (ref 0.9–1.3)
GFR African American: >60 mL/min/{1.73_m2} (ref >60)
GFR Non-African American: >60 mL/min/{1.73_m2} (ref >60)
Glucose: 192 MG/DL — ABNORMAL HIGH (ref 70–99)
Potassium: 4.6 MMOL/L (ref 3.5–5.1)
Sodium: 138 MMOL/L (ref 135–145)
Total Bilirubin: 0.2 MG/DL (ref 0.0–1.0)
Total Protein: 7 GM/DL (ref 6.4–8.2)

## 2017-05-30 LAB — CBC WITH AUTO DIFFERENTIAL
Basophils %: 0.6 % (ref 0–1)
Basophils Absolute: 0.1 10*3/uL
Eosinophils %: 3.1 % — ABNORMAL HIGH (ref 0–3)
Eosinophils Absolute: 0.6 10*3/uL
Hematocrit: 39.4 % — ABNORMAL LOW (ref 42–52)
Hemoglobin: 12.4 GM/DL — ABNORMAL LOW (ref 13.5–18.0)
Immature Neutrophil %: 0.4 % (ref 0–0.43)
Lymphocytes %: 43.8 % (ref 24–44)
Lymphocytes Absolute: 9.2 10*3/uL
MCH: 27 PG (ref 27–31)
MCHC: 31.5 % — ABNORMAL LOW (ref 32.0–36.0)
MCV: 85.7 FL (ref 78–100)
MPV: 10.1 FL (ref 7.5–11.1)
Monocytes %: 5.5 % — ABNORMAL HIGH (ref 0–4)
Monocytes Absolute: 1.2 10*3/uL
Nucleated RBC %: 0 %
Platelets: 416 10*3/uL (ref 140–440)
RBC: 4.6 10*6/uL (ref 4.6–6.2)
RDW: 16.3 % — ABNORMAL HIGH (ref 11.7–14.9)
Segs Absolute: 9.8 10*3/uL
Segs Relative: 46.6 % (ref 36–66)
Total Immature Neutrophil: 0.08 10*3/uL
Total Nucleated RBC: 0 10*3/uL
WBC: 21 10*3/uL — ABNORMAL HIGH (ref 4.0–10.5)

## 2017-05-30 LAB — LIPASE: Lipase: 51 IU/L (ref 13–60)

## 2017-05-30 LAB — TROPONIN: Troponin T: <0.01 NG/ML (ref <0.01)

## 2017-05-30 MED ORDER — MORPHINE SULFATE (PF) 4 MG/ML IJ SOLN
4 MG/ML | Freq: Once | INTRAMUSCULAR | Status: AC
Start: 2017-05-30 — End: 2017-05-30
  Administered 2017-05-30: 23:00:00 4 mg via INTRAVENOUS

## 2017-05-30 MED ORDER — SODIUM CHLORIDE 0.9 % IV BOLUS
0.9 % | Freq: Once | INTRAVENOUS | Status: AC
Start: 2017-05-30 — End: 2017-05-30
  Administered 2017-05-30: 23:00:00 1000 mL via INTRAVENOUS

## 2017-05-30 MED ORDER — ASPIRIN 81 MG PO CHEW
81 MG | Freq: Once | ORAL | Status: AC
Start: 2017-05-30 — End: 2017-05-30
  Administered 2017-05-30: 23:00:00 324 mg via ORAL

## 2017-05-30 MED ORDER — ONDANSETRON HCL 4 MG/2ML IJ SOLN
4 MG/2ML | Freq: Once | INTRAMUSCULAR | Status: AC
Start: 2017-05-30 — End: 2017-05-30
  Administered 2017-05-30: 23:00:00 4 mg via INTRAVENOUS

## 2017-05-30 MED FILL — MORPHINE SULFATE 4 MG/ML IJ SOLN: 4 mg/mL | INTRAMUSCULAR | Qty: 1

## 2017-05-30 MED FILL — SODIUM CHLORIDE 0.9 % IV SOLN: 0.9 % | INTRAVENOUS | Qty: 1000

## 2017-05-30 MED FILL — ONDANSETRON HCL 4 MG/2ML IJ SOLN: 4 MG/2ML | INTRAMUSCULAR | Qty: 2

## 2017-05-30 MED FILL — ASPIRIN 81 MG PO CHEW: 81 mg | ORAL | Qty: 4

## 2017-05-30 NOTE — H&P (Signed)
Department of Internal Medicine  Hospitalist  Attending History and Physical      CHIEF COMPLAINT:  Chest and abd pain    Reason for Admission:  As above    History Obtained From:  patient    HISTORY OF PRESENT ILLNESS:      The patient is a 57 y.o. male with significant past medical history of chronic abd pain for past 6 wks and states has had IBS, pancreatitis and diverticulitis in the past and is supposed to follow up with GI. Associated with vomiting and diarrhea. No fevers. Today he started to have left sided chest pain localized with no shortness of breath, cough or dizziness.     Past Medical History:        Diagnosis Date   ??? CAD (coronary artery disease)    ??? Diabetes ([place])    ??? Hyperlipidemia    ??? Hypertension    ??? Nerve pain    ??? Scoliosis      Past Surgical History:        Procedure Laterality Date   ??? PR SONO GUIDE NEEDLE BIOPSY       Immunizations:            Medications Prior to Admission:    No current facility-administered medications on file prior to encounter.      Current Outpatient Medications on File Prior to Encounter   Medication Sig Dispense Refill   ??? dicyclomine (BENTYL) 10 MG capsule Take 2 capsules by mouth 4 times daily (before meals and nightly) 30 capsule 0   ??? ondansetron (ZOFRAN ODT) 4 MG disintegrating tablet Take 1 tablet by mouth every 6 hours 10 tablet 0   ??? metFORMIN (GLUCOPHAGE) 1000 MG tablet Take 1,000 mg by mouth 2 times daily (with meals)     ??? hydrOXYzine (VISTARIL) 50 MG capsule Take 50 mg by mouth 3 times daily as needed for Itching     ??? pantoprazole sodium (PROTONIX) 40 MG PACK packet Take 40 mg by mouth every morning (before breakfast)     ??? omeprazole (PRILOSEC) 20 MG delayed release capsule Take 20 mg by mouth daily     ??? atorvastatin (LIPITOR) 20 MG tablet Take 20 mg by mouth daily     ??? cyclobenzaprine (FLEXERIL) 10 MG tablet Take 1 tablet by mouth 3 times daily as needed for Muscle spasms 20 tablet 0   ??? DULoxetine (CYMBALTA) 30 MG extended release capsule Take  1 capsule by mouth daily 30 capsule 0   ??? gabapentin (NEURONTIN) 300 MG capsule Take 1 capsule by mouth 3 times daily for 10 days.. 30 capsule 0         Allergies:  Patient has no known allergies.    Social History:   Social History     Socioeconomic History   ??? Marital status: Single     Spouse name: Not on file   ??? Number of children: Not on file   ??? Years of education: Not on file   ??? Highest education level: Not on file   Occupational History   ??? Not on file   Social Needs   ??? Financial resource strain: Not on file   ??? Food insecurity:     Worry: Not on file     Inability: Not on file   ??? Transportation needs:     Medical: Not on file     Non-medical: Not on file   Tobacco Use   ??? Smoking status: Current Every  Day Smoker     Packs/day: 1.00     Types: Cigarettes   ??? Smokeless tobacco: Never Used   Substance and Sexual Activity   ??? Alcohol use: No   ??? Drug use: Yes     Frequency: 3.0 times per week     Types: Marijuana   ??? Sexual activity: Not on file   Lifestyle   ??? Physical activity:     Days per week: Not on file     Minutes per session: Not on file   ??? Stress: Not on file   Relationships   ??? Social connections:     Talks on phone: Not on file     Gets together: Not on file     Attends religious service: Not on file     Active member of club or organization: Not on file     Attends meetings of clubs or organizations: Not on file     Relationship status: Not on file   ??? Intimate partner violence:     Fear of current or ex partner: Not on file     Emotionally abused: Not on file     Physically abused: Not on file     Forced sexual activity: Not on file   Other Topics Concern   ??? Not on file   Social History Narrative   ??? Not on file         Family History:   History reviewed. No pertinent family history.  Brother with MI and sister with CVA  REVIEW OF SYSTEMS:  CONSTITUTIONAL:  negative  EYES:  negative  HEENT:  negative  RESPIRATORY:  negative  CARDIOVASCULAR:  positive for  chest pain  GASTROINTESTINAL:   positive for abdominal pain  ENDOCRINE:  negative  MUSCULOSKELETAL:  negative  NEUROLOGICAL:  negative  BEHAVIOR/PSYCH:  negative  PHYSICAL EXAM:    Vitals:  BP (!) 159/98    Pulse 75    Temp 99.1 ??F (37.3 ??C) (Oral)    Resp 10    Ht 5\' 6"  (1.676 m)    Wt 187 lb (84.8 kg)    SpO2 98%    BMI 30.18 kg/m??     CONSTITUTIONAL:  awake  EYES:  Lids and lashes normal, pupils equal, round and reactive to light, extra ocular muscles intact, sclera clear, conjunctiva normal  ENT:  Normocephalic, without obvious abnormality, atraumatic, sinuses nontender on palpation, external ears without lesions, oral pharynx with moist mucus membranes, tonsils without erythema or exudates, gums normal and good dentition.  NECK:  Supple, symmetrical, trachea midline, no adenopathy, thyroid symmetric, not enlarged and no tenderness, skin normal  LUNGS:  No increased work of breathing, good air exchange, clear to auscultation bilaterally, no crackles or wheezing  CARDIOVASCULAR:  Normal apical impulse, regular rate and rhythm, normal S1 and S2, no S3 or S4, and no murmur noted  ABDOMEN:  Soft, generalized tenderness but mostly LLQ. No rebound   MUSCULOSKELETAL:  there is no redness, warmth, or swelling of the joints  NEUROLOGIC:  Awake, alert, oriented to name, place and time.  Cranial nerves II-XII are grossly intact.  Motor is 5 out of 5 bilaterally.  Cerebellar finger to nose, heel to shin intact.   SKIN:  no bruising or bleeding    DATA:  cT abd  . No acute abdominopelvic abnormality.   2. Small hiatal hernia.   3. Minimal sigmoid diverticulosis.   ??     CXR  NAD  EKG 87 SR  ASSESSMENT AND PLAN:    Chest pain  -ASA, serial Ce, echo   -consult  Cardiology  Acute on chronic ABd pain and possible clinical diverticulitis?  -levaquin and flagyl  -CT imaging similar to previous and lipase and LFT wnl  -consult GI   -IV morphine  DM  -SSI  DVT prophylaxis  -lovenox

## 2017-05-30 NOTE — ED Notes (Signed)
Pt medicated per MAR.     Delila Pereyra, RN  05/30/17 [id number]

## 2017-05-30 NOTE — ED Notes (Signed)
Patient wants labs to be drawn  When nurse puts IV in for meds.      Dayna Barker  05/30/17 1812

## 2017-05-30 NOTE — ED Notes (Signed)
hospitalist at bedside.     Fortino Sic, RN  05/30/17 [phone]

## 2017-05-30 NOTE — ED Notes (Signed)
@  2222 paged [facility]-- Dr Margaree Mackintosh

## 2017-05-30 NOTE — ED Notes (Signed)
Patient to ct at this time.      Loraine Grip, RN  05/30/17 2126

## 2017-05-30 NOTE — ED Provider Notes (Signed)
Triage Chief Complaint:   Abdominal Pain (for the past 5-6 weeks, has appt. with GI dr on 5/22 but unable to wait) and Chest Pain (onset this morning)      HOPI:  Ricardo Foley is a 57 y.o. male that presents to the emergency department with abdominal pain and chest pain. Patient states that this abdominal pain is been going on for the last 5-6 weeks. He is scheduled to see a new GI physician in 2 weeks but states he cannot wait because "the pain is unbearable." He tells me that he's had this pain intermittently for many years. He just recently moved here from [place]. He does have a primary care physician here. He is a history of CAD, hypertension, diabetes, high cholesterol and hyperlipidemia. He states he's been told his abdominal pain is due to a history of diverticulitis, pancreatitis and irritable bowel syndrome. He denies previous abdominal surgeries. He is having nausea and vomiting. He states this morning he developed left-sided chest pressure. This pain does not radiate anywhere. He denies shortness of breath. He was seen here about 3 weeks ago for the same abdominal pain. He states nothing seems to make this better or worse though when he comes to the emergency department "I know what works best for me. They usually give me IV pain medicine and it goes away."     Past Medical History:   Diagnosis Date   ??? CAD (coronary artery disease)    ??? Diabetes ([place])    ??? Hyperlipidemia    ??? Hypertension    ??? Nerve pain    ??? Scoliosis      Past Surgical History:   Procedure Laterality Date   ??? PR SONO GUIDE NEEDLE BIOPSY       History reviewed. No pertinent family history.  Social History     Socioeconomic History   ??? Marital status: Single     Spouse name: Not on file   ??? Number of children: Not on file   ??? Years of education: Not on file   ??? Highest education level: Not on file   Occupational History   ??? Not on file   Social Needs   ??? Financial resource strain: Not on file   ??? Food insecurity:     Worry: Not on  file     Inability: Not on file   ??? Transportation needs:     Medical: Not on file     Non-medical: Not on file   Tobacco Use   ??? Smoking status: Current Every Day Smoker     Packs/day: 1.00     Types: Cigarettes   ??? Smokeless tobacco: Never Used   Substance and Sexual Activity   ??? Alcohol use: No   ??? Drug use: Yes     Frequency: 3.0 times per week     Types: Marijuana   ??? Sexual activity: Not on file   Lifestyle   ??? Physical activity:     Days per week: Not on file     Minutes per session: Not on file   ??? Stress: Not on file   Relationships   ??? Social connections:     Talks on phone: Not on file     Gets together: Not on file     Attends religious service: Not on file     Active member of club or organization: Not on file     Attends meetings of clubs or organizations: Not on file  Relationship status: Not on file   ??? Intimate partner violence:     Fear of current or ex partner: Not on file     Emotionally abused: Not on file     Physically abused: Not on file     Forced sexual activity: Not on file   Other Topics Concern   ??? Not on file   Social History Narrative   ??? Not on file     Current Facility-Administered Medications   Medication Dose Route Frequency Provider Last Rate Last Dose   ??? sodium chloride flush 0.9 % injection 10 mL  10 mL Intravenous BID Meliton Rattan, MD   10 mL at 05/30/17 2127   ??? morphine sulfate (PF) injection 4 mg  4 mg Intravenous Q3H PRN Lester Kinsman, MD         Current Outpatient Medications   Medication Sig Dispense Refill   ??? dicyclomine (BENTYL) 10 MG capsule Take 2 capsules by mouth 4 times daily (before meals and nightly) 30 capsule 0   ??? ondansetron (ZOFRAN ODT) 4 MG disintegrating tablet Take 1 tablet by mouth every 6 hours 10 tablet 0   ??? metFORMIN (GLUCOPHAGE) 1000 MG tablet Take 1,000 mg by mouth 2 times daily (with meals)     ??? hydrOXYzine (VISTARIL) 50 MG capsule Take 50 mg by mouth 3 times daily as needed for Itching     ??? pantoprazole sodium (PROTONIX) 40 MG PACK packet  Take 40 mg by mouth every morning (before breakfast)     ??? omeprazole (PRILOSEC) 20 MG delayed release capsule Take 20 mg by mouth daily     ??? atorvastatin (LIPITOR) 20 MG tablet Take 20 mg by mouth daily     ??? cyclobenzaprine (FLEXERIL) 10 MG tablet Take 1 tablet by mouth 3 times daily as needed for Muscle spasms 20 tablet 0   ??? DULoxetine (CYMBALTA) 30 MG extended release capsule Take 1 capsule by mouth daily 30 capsule 0   ??? gabapentin (NEURONTIN) 300 MG capsule Take 1 capsule by mouth 3 times daily for 10 days.. 30 capsule 0     No Known Allergies  Nursing Notes Reviewed    ROS:  At least 10 systems reviewed and otherwise negative except as in the [place].    Physical Exam:  ED Triage Vitals [05/30/17 1729]   Enc Vitals Group      BP (!) 157/70      Pulse 90      Resp 16      Temp 99.1 ??F (37.3 ??C)      Temp Source Oral      SpO2 97 %      Weight 187 lb (84.8 kg)      Height 5\' 6"  (1.676 m)      Head Circumference       Peak Flow       Pain Score       Pain Loc       Pain Edu?       Excl. in [place]?      My pulse oximetry interpretation is which is within the normal range    GENERAL APPEARANCE: Awake and alert. Cooperative. No acute distress.  HEAD:  Atraumatic.  EYES: EOM's grossly intact.   ENT: Mucous membranes are moist.  No trismus.  NECK:  Trachea midline.  HEART: RRR. Radial pulses 2+.  LUNGS: Respirations unlabored. CTAB  ABDOMEN: Soft. Nondistended. Diffuse abdominal TTP without guarding or rebound  EXTREMITIES: No acute  deformities.  SKIN: Warm and dry.  NEUROLOGICAL: No gross facial drooping. Moves all 4 extremities spontaneously.  PSYCHIATRIC: Normal mood.    I have reviewed and interpreted all of the currently available lab results from this visit (if applicable):  Results for orders placed or performed during the hospital encounter of 05/30/17   CBC auto diff   Result Value Ref Range    WBC 21.0 (H) 4.0 - 10.5 K/CU MM    RBC 4.60 4.6 - 6.2 M/CU MM    Hemoglobin 12.4 (L) 13.5 - 18.0 GM/DL    Hematocrit  39.4 (L) 42 - 52 %    MCV 85.7 78 - 100 FL    MCH 27.0 27 - 31 PG    MCHC 31.5 (L) 32.0 - 36.0 %    RDW 16.3 (H) 11.7 - 14.9 %    Platelets 416 140 - 440 K/CU MM    MPV 10.1 7.5 - 11.1 FL    Differential Type AUTOMATED DIFFERENTIAL     Segs Relative 46.6 36 - 66 %    Lymphocytes % 43.8 24 - 44 %    Monocytes % 5.5 (H) 0 - 4 %    Eosinophils % 3.1 (H) 0 - 3 %    Basophils % 0.6 0 - 1 %    Segs Absolute 9.8 K/CU MM    Lymphocytes # 9.2 K/CU MM    Monocytes # 1.2 K/CU MM    Eosinophils # 0.6 K/CU MM    Basophils # 0.1 K/CU MM    Nucleated RBC % 0.0 %    Total Nucleated RBC 0.0 K/CU MM    Total Immature Neutrophil 0.08 K/CU MM    Immature Neutrophil % 0.4 0 - 0.43 %   CMP   Result Value Ref Range    Sodium 138 135 - 145 MMOL/L    Potassium 4.6 3.5 - 5.1 MMOL/L    Chloride 99 99 - 110 mMol/L    CO2 27 21 - 32 MMOL/L    BUN 17 6 - 23 MG/DL    CREATININE 1.2 0.9 - 1.3 MG/DL    Glucose 192 (H) 70 - 99 MG/DL    Calcium 9.2 8.3 - 10.6 MG/DL    Alb 4.5 3.4 - 5.0 GM/DL    Total Protein 7.0 6.4 - 8.2 GM/DL    Total Bilirubin 0.2 0.0 - 1.0 MG/DL    ALT 27 10 - 40 U/L    AST 15 15 - 37 IU/L    Alkaline Phosphatase 60 40 - 129 IU/L    GFR Non-African American >60 >60 mL/min/1.52m2    GFR African American >60 >60 mL/min/1.72m2    Anion Gap 12 4 - 16   Troponin   Result Value Ref Range    Troponin T <0.010 <0.01 NG/ML   Lipase   Result Value Ref Range    Lipase 51 13 - 60 IU/L          EKG: (All EKG's are interpreted by myself in the absence of a cardiologist)  12 lead EKG:  Normal Sinus Rhythm 87  Axis is   Normal  QTc is  normal  There is no specific ST-T wave changes appreciated.  There is no clear evidence of acute ischemia or infarction.  It was compared against an EKG from none.      MDM:    HEART SCORE:    History: +1 for moderate suspicion  "Highly suspicious" = middle of left-sided, heavy  chest pain, initiated by exercise, emotions or cold, radiation and/or relief of symptoms by sublingual nitrates  EKG: +0 for normal EKG    "Nonspecific changes" = nonspecific depolarization, LVH or any bundle branch block (even if old)  Age: +51 for age 68-65 years  Risk factors (includes HLD, HTN, DM, tobacco use, obesity, and +FHx): +2 for known CAD or 3+ risk factors  "Smoking" = Current or within the past month, "family history" = parents, siblings, children with diagnoses of CAD  "Obesity" = BMI > 30  Initial troponin: +0 for negative troponin    Heart score: 4.  This falls under the following category: Score of 4-6, which indicates low/moderate risk (12-16.6%) for major adverse cardiac event and supports observation with repeated troponins and/or non-invasive testing    Patient's vital signs are stable. I did give him several medications for his pain. He continued to have nausea. Given his ongoing abdominal pain I did obtain a CT scan. This was negative for any acute findings. His EKG and troponin are both negative. However given his chest pain he does fall into a moderate risk with a heart score 4. I discussed with the hospitalist and the patient will be admitted at this time.    Clinical Impression:  1. Chest pain, unspecified type        Disposition Vitals:  @LASTENCBP @, @LASTENCPU @, @LASTENCRE @, @LASTENCSP @    Disposition referral (if applicable):  Hazel Sams, APRN - CNP  [place] [address]  [phone]            Disposition medications (if applicable):  New Prescriptions    No medications on file         (Please note that portions of this note may have been completed with a voice recognition program. Efforts were made to edit the dictations but occasionally words are mis-transcribed.)    Fredric Mare, MD           Meliton Rattan, MD  05/30/17 2326

## 2017-05-31 ENCOUNTER — Observation Stay: Admit: 2017-05-31 | Payer: PRIVATE HEALTH INSURANCE | Primary: Family

## 2017-05-31 LAB — URINALYSIS WITH REFLEX TO CULTURE
Bacteria, UA: NEGATIVE /HPF
Bilirubin Urine: NEGATIVE MG/DL
Glucose, Urine: NEGATIVE MG/DL
Ketones, Urine: NEGATIVE MG/DL
Leukocyte Esterase, Urine: NEGATIVE
Nitrite Urine, Quantitative: NEGATIVE
Protein, UA: NEGATIVE MG/DL
RBC, UA: 1 /HPF (ref 0–3)
Specific Gravity, UA: 1.012 (ref 1.001–1.035)
Trichomonas, UA: NONE SEEN /HPF
Urobilinogen, Urine: NORMAL MG/DL (ref 0.2–1.0)
WBC, UA: <1 /HPF (ref 0–2)
pH, Urine: 6 (ref 5.0–8.0)

## 2017-05-31 LAB — TROPONIN
Troponin T: <0.01 NG/ML (ref <0.01)
Troponin T: <0.01 NG/ML (ref <0.01)

## 2017-05-31 LAB — POCT GLUCOSE
POC Glucose: 135 MG/DL — ABNORMAL HIGH (ref 70–99)
POC Glucose: 143 MG/DL — ABNORMAL HIGH (ref 70–99)
POC Glucose: 136 MG/DL — ABNORMAL HIGH (ref 70–99)

## 2017-05-31 LAB — CBC
Hematocrit: 40.7 % — ABNORMAL LOW (ref 42–52)
Hemoglobin: 12.3 GM/DL — ABNORMAL LOW (ref 13.5–18.0)
MCH: 26.5 PG — ABNORMAL LOW (ref 27–31)
MCHC: 30.2 % — ABNORMAL LOW (ref 32.0–36.0)
MCV: 87.5 FL (ref 78–100)
MPV: 10.3 FL (ref 7.5–11.1)
Platelets: 357 10*3/uL (ref 140–440)
RBC: 4.65 10*6/uL (ref 4.6–6.2)
RDW: 16.6 % — ABNORMAL HIGH (ref 11.7–14.9)
WBC: 16.9 10*3/uL — ABNORMAL HIGH (ref 4.0–10.5)

## 2017-05-31 LAB — URINE DRUG SCREEN
Amphetamines: NEGATIVE
Barbiturate Screen, Ur: NEGATIVE
Benzodiazepine Screen, Urine: NEGATIVE
Cannabinoid Scrn, Ur: POSITIVE — AB
Cocaine Metabolite: NEGATIVE
Opiates, Urine: POSITIVE — AB
Oxycodone: NEGATIVE
Phencyclidine, Urine: NEGATIVE

## 2017-05-31 LAB — HEMOGLOBIN A1C
Hemoglobin A1C: 7.2 % — ABNORMAL HIGH (ref 4.2–6.3)
eAG: 160 mg/dL

## 2017-05-31 LAB — ECHOCARDIOGRAM COMPLETE 2D W DOPPLER W COLOR: Left Ventricular Ejection Fraction: 55

## 2017-05-31 LAB — LACTIC ACID: Lactate: 1.6 mMOL/L (ref 0.4–2.0)

## 2017-05-31 LAB — IGA: IgA: 245 MG/DL (ref 69–382)

## 2017-05-31 MED ORDER — NORMAL SALINE FLUSH 0.9 % IV SOLN
0.9 % | INTRAVENOUS | Status: DC | PRN
Start: 2017-05-31 — End: 2017-06-02

## 2017-05-31 MED ORDER — DEXTROSE 5 % IV SOLN
5 % | INTRAVENOUS | Status: DC | PRN
Start: 2017-05-31 — End: 2017-06-02

## 2017-05-31 MED ORDER — ONDANSETRON HCL 4 MG/2ML IJ SOLN
4 MG/2ML | Freq: Once | INTRAMUSCULAR | Status: AC
Start: 2017-05-31 — End: 2017-05-30
  Administered 2017-05-31: 4 mg via INTRAVENOUS

## 2017-05-31 MED ORDER — NORMAL SALINE FLUSH 0.9 % IV SOLN
0.9 % | Freq: Two times a day (BID) | INTRAVENOUS | Status: DC
Start: 2017-05-31 — End: 2017-06-02
  Administered 2017-05-31: 01:00:00 10 mL via INTRAVENOUS

## 2017-05-31 MED ORDER — ATORVASTATIN CALCIUM 20 MG PO TABS
20 MG | Freq: Every day | ORAL | Status: DC
Start: 2017-05-31 — End: 2017-06-02
  Administered 2017-05-31 – 2017-06-02 (×3): 20 mg via ORAL

## 2017-05-31 MED ORDER — ONDANSETRON HCL 4 MG/2ML IJ SOLN
4 MG/2ML | Freq: Four times a day (QID) | INTRAMUSCULAR | Status: DC | PRN
Start: 2017-05-31 — End: 2017-06-02
  Administered 2017-05-31 – 2017-06-01 (×2): 4 mg via INTRAVENOUS

## 2017-05-31 MED ORDER — ASPIRIN 81 MG PO CHEW
81 MG | Freq: Every day | ORAL | Status: DC
Start: 2017-05-31 — End: 2017-06-02
  Administered 2017-05-31 – 2017-06-02 (×3): 81 mg via ORAL

## 2017-05-31 MED ORDER — FAMOTIDINE 20 MG/2ML IV SOLN
20 MG/2ML | Freq: Once | INTRAVENOUS | Status: AC
Start: 2017-05-31 — End: 2017-05-30
  Administered 2017-05-31: 20 mg via INTRAVENOUS

## 2017-05-31 MED ORDER — PANTOPRAZOLE SODIUM 40 MG PO PACK
40 MG | Freq: Every day | ORAL | Status: DC
Start: 2017-05-31 — End: 2017-06-02
  Administered 2017-05-31 – 2017-06-02 (×3): 40 mg via ORAL

## 2017-05-31 MED ORDER — DEXTROSE 50 % IV SOLN
50 % | INTRAVENOUS | Status: DC | PRN
Start: 2017-05-31 — End: 2017-06-02

## 2017-05-31 MED ORDER — ENOXAPARIN SODIUM 40 MG/0.4ML SC SOLN
40 MG/0.4ML | Freq: Every day | SUBCUTANEOUS | Status: DC
Start: 2017-05-31 — End: 2017-06-02
  Administered 2017-05-31 – 2017-06-02 (×3): 40 mg via SUBCUTANEOUS

## 2017-05-31 MED ORDER — METRONIDAZOLE IN NACL 5-0.79 MG/ML-% IV SOLN
5 mg/mL | Freq: Three times a day (TID) | INTRAVENOUS | Status: DC
Start: 2017-05-31 — End: 2017-06-02
  Administered 2017-05-31 – 2017-06-02 (×7): 500 mg via INTRAVENOUS

## 2017-05-31 MED ORDER — SODIUM CHLORIDE 0.9 % IJ SOLN
0.9 % | Freq: Once | INTRAMUSCULAR | Status: AC | PRN
Start: 2017-05-31 — End: 2017-05-31
  Administered 2017-05-31: 19:00:00 10 mL via INTRAVENOUS

## 2017-05-31 MED ORDER — IOPAMIDOL 76 % IV SOLN
76 % | Freq: Once | INTRAVENOUS | Status: AC | PRN
Start: 2017-05-31 — End: 2017-05-30
  Administered 2017-05-31: 01:00:00 80 mL via INTRAVENOUS

## 2017-05-31 MED ORDER — DICYCLOMINE HCL 10 MG PO CAPS
10 MG | Freq: Four times a day (QID) | ORAL | Status: DC
Start: 2017-05-31 — End: 2017-06-02
  Administered 2017-05-31 – 2017-06-02 (×10): 20 mg via ORAL

## 2017-05-31 MED ORDER — CYCLOBENZAPRINE HCL 10 MG PO TABS
10 MG | Freq: Three times a day (TID) | ORAL | Status: DC | PRN
Start: 2017-05-31 — End: 2017-06-02

## 2017-05-31 MED ORDER — DICYCLOMINE HCL 10 MG PO CAPS
10 MG | Freq: Three times a day (TID) | ORAL | Status: DC | PRN
Start: 2017-05-31 — End: 2017-06-02

## 2017-05-31 MED ORDER — KETOROLAC TROMETHAMINE 30 MG/ML IJ SOLN
30 MG/ML | Freq: Once | INTRAMUSCULAR | Status: AC
Start: 2017-05-31 — End: 2017-05-30
  Administered 2017-05-31: 02:00:00 30 mg via INTRAVENOUS

## 2017-05-31 MED ORDER — IOPAMIDOL 76 % IV SOLN
76 % | Freq: Once | INTRAVENOUS | Status: AC | PRN
Start: 2017-05-31 — End: 2017-05-31
  Administered 2017-05-31: 19:00:00 85 mL via INTRAVENOUS

## 2017-05-31 MED ORDER — LEVOFLOXACIN IN D5W 500 MG/100ML IV SOLN
500 MG/100ML | INTRAVENOUS | Status: DC
Start: 2017-05-31 — End: 2017-05-31
  Administered 2017-05-31: 08:00:00 500 mg via INTRAVENOUS

## 2017-05-31 MED ORDER — MAGNESIUM HYDROXIDE 400 MG/5ML PO SUSP
400 MG/5ML | Freq: Every day | ORAL | Status: DC | PRN
Start: 2017-05-31 — End: 2017-06-02

## 2017-05-31 MED ORDER — MORPHINE SULFATE (PF) 4 MG/ML IJ SOLN
4 MG/ML | INTRAMUSCULAR | Status: DC | PRN
Start: 2017-05-31 — End: 2017-06-02
  Administered 2017-05-31 – 2017-06-02 (×16): 4 mg via INTRAVENOUS

## 2017-05-31 MED ORDER — NORMAL SALINE FLUSH 0.9 % IV SOLN
0.9 % | Freq: Two times a day (BID) | INTRAVENOUS | Status: DC
Start: 2017-05-31 — End: 2017-06-02
  Administered 2017-05-31 – 2017-06-02 (×5): 10 mL via INTRAVENOUS

## 2017-05-31 MED ORDER — GLUCOSE 40 % PO GEL
40 % | ORAL | Status: DC | PRN
Start: 2017-05-31 — End: 2017-06-02

## 2017-05-31 MED ORDER — INSULIN LISPRO 100 UNIT/ML SC SOLN
100 UNIT/ML | Freq: Three times a day (TID) | SUBCUTANEOUS | Status: DC
Start: 2017-05-31 — End: 2017-06-02
  Administered 2017-05-31: 16:00:00 2 [IU] via SUBCUTANEOUS
  Administered 2017-06-01: 12:00:00 4 [IU] via SUBCUTANEOUS
  Administered 2017-06-01: 17:00:00 2 [IU] via SUBCUTANEOUS

## 2017-05-31 MED ORDER — INSULIN LISPRO 100 UNIT/ML SC SOLN
100 UNIT/ML | Freq: Every evening | SUBCUTANEOUS | Status: DC
Start: 2017-05-31 — End: 2017-06-02
  Administered 2017-06-01 – 2017-06-02 (×2): 1 [IU] via SUBCUTANEOUS

## 2017-05-31 MED ORDER — CIPROFLOXACIN IN D5W 400 MG/200ML IV SOLN
400 MG/200ML | Freq: Two times a day (BID) | INTRAVENOUS | Status: DC
Start: 2017-05-31 — End: 2017-06-02
  Administered 2017-05-31 – 2017-06-02 (×4): 400 mg via INTRAVENOUS

## 2017-05-31 MED ORDER — GLUCAGON HCL RDNA (DIAGNOSTIC) 1 MG IJ SOLR
1 MG | INTRAMUSCULAR | Status: DC | PRN
Start: 2017-05-31 — End: 2017-06-02

## 2017-05-31 MED ORDER — DEXTROSE 5 % IV SOLN
5 % | Freq: Once | INTRAVENOUS | Status: DC
Start: 2017-05-31 — End: 2017-05-31

## 2017-05-31 MED ORDER — DULOXETINE HCL 30 MG PO CPEP
30 MG | Freq: Every day | ORAL | Status: DC
Start: 2017-05-31 — End: 2017-06-02
  Administered 2017-05-31 – 2017-06-02 (×3): 30 mg via ORAL

## 2017-05-31 MED FILL — HUMALOG 100 UNIT/ML SC SOLN: 100 [IU]/mL | SUBCUTANEOUS | Qty: 300

## 2017-05-31 MED FILL — MORPHINE SULFATE 4 MG/ML IJ SOLN: 4 mg/mL | INTRAMUSCULAR | Qty: 1

## 2017-05-31 MED FILL — CALCIUM GLUCONATE 10 % IV SOLN: 10 % | INTRAVENOUS | Qty: 10

## 2017-05-31 MED FILL — METRONIDAZOLE IN NACL 5-0.79 MG/ML-% IV SOLN: 5 mg/mL | INTRAVENOUS | Qty: 100

## 2017-05-31 MED FILL — ONDANSETRON HCL 4 MG/2ML IJ SOLN: 4 MG/2ML | INTRAMUSCULAR | Qty: 2

## 2017-05-31 MED FILL — DICYCLOMINE HCL 10 MG PO CAPS: 10 mg | ORAL | Qty: 1

## 2017-05-31 MED FILL — ASPIRIN 81 MG PO CHEW: 81 mg | ORAL | Qty: 1

## 2017-05-31 MED FILL — ISOVUE-370 76 % IV SOLN: 76 % | INTRAVENOUS | Qty: 100

## 2017-05-31 MED FILL — LEVOFLOXACIN IN D5W 500 MG/100ML IV SOLN: 500 MG/100ML | INTRAVENOUS | Qty: 100

## 2017-05-31 MED FILL — KETOROLAC TROMETHAMINE 30 MG/ML IJ SOLN: 30 mg/mL | INTRAMUSCULAR | Qty: 1

## 2017-05-31 MED FILL — FAMOTIDINE 20 MG/2ML IV SOLN: 20 MG/2ML | INTRAVENOUS | Qty: 2

## 2017-05-31 MED FILL — DICYCLOMINE HCL 10 MG PO CAPS: 10 mg | ORAL | Qty: 2

## 2017-05-31 MED FILL — CIPROFLOXACIN IN D5W 400 MG/200ML IV SOLN: 400 MG/200ML | INTRAVENOUS | Qty: 200

## 2017-05-31 MED FILL — LOVENOX 40 MG/0.4ML SC SOLN: 40 MG/0.4ML | SUBCUTANEOUS | Qty: 0.4

## 2017-05-31 MED FILL — DULOXETINE HCL 30 MG PO CPEP: 30 mg | ORAL | Qty: 1

## 2017-05-31 MED FILL — LIPITOR 20 MG PO TABS: 20 mg | ORAL | Qty: 1

## 2017-05-31 MED FILL — PROTONIX 40 MG PO PACK: 40 mg | ORAL | Qty: 1

## 2017-05-31 NOTE — Care Coordination-Inpatient (Signed)
Patient screened for discharge needs with no needs identified at this time. Pt is from home, has PCP, insurance with RX coverage and was independent prior to admission. However, CM available if needs arise.

## 2017-05-31 NOTE — Consults (Signed)
Department of General Surgery   Surgical Service Dr. Marcene Brawn   Consult Note    Date of Consult: 05/31/17    Reason for Consult:  Rule out mesenteric ischemia    Requesting Physician:  Dr. Roosevelt Locks    CHIEF COMPLAINT:  Abdominal pain    History Obtained From:  patient, electronic medical record    HISTORY OF PRESENT ILLNESS:      The patient is a 57 y.o. male who presented to ED with ongoing lower abdominal pain for last 5-6 weeks. Of note, he does have chronic abdominal pain. Pt states the pain is lower in location. Rates as 10/10 at worst. It comes and goes. States it comes at random times "when I get up to use the restroom." He says nothing makes it better or worse except taking a shower. He says it does not radiate. He states associated with emesis if the pain is so bad along with nausea. He is tolerating PO. Denies changes in bowel habits.     Pt was seen in ED and admitted. He was worked up with CT A/P which was unimpressive for acute surgical cause.    He was seen by GI. Pt states he has had multiple upper and lower scopes in recent past. He states they were normal (but I have not personally reviewed the results). [place]    I was asked to evaluate the pt by his GI physician to rule out mesenteric ischemia.     Past Medical History:    Past Medical History:   Diagnosis Date   ??? CAD (coronary artery disease)    ??? Diabetes ([place])    ??? Hyperlipidemia    ??? Hypertension    ??? Nerve pain    ??? Scoliosis        Past Surgical History:    Past Surgical History:   Procedure Laterality Date   ??? PR SONO GUIDE NEEDLE BIOPSY         Current Medications:   Current Facility-Administered Medications   Medication Dose Route Frequency Provider Last Rate Last Dose   ??? atorvastatin (LIPITOR) tablet 20 mg  20 mg Oral Daily Lester Kinsman, MD   20 mg at 05/31/17 0845   ??? cyclobenzaprine (FLEXERIL) tablet 10 mg  10 mg Oral TID PRN Lester Kinsman, MD       ??? dicyclomine (BENTYL) capsule 20 mg  20 mg Oral 4x Daily AC &  HS Lester Kinsman, MD   20 mg at 05/31/17 1223   ??? glucose (GLUTOSE) 40 % oral gel 15 g  15 g Oral PRN Lester Kinsman, MD       ??? dextrose 50 % solution 12.5 g  12.5 g Intravenous PRN Lester Kinsman, MD       ??? glucagon (rDNA) injection 1 mg  1 mg Intramuscular PRN Lester Kinsman, MD       ??? dextrose 5 % solution  100 mL/hr Intravenous PRN Lester Kinsman, MD       ??? insulin lispro (HUMALOG) injection vial 0-12 Units  0-12 Units Subcutaneous TID WC Lester Kinsman, MD   2 Units at 05/31/17 1224   ??? insulin lispro (HUMALOG) injection vial 0-6 Units  0-6 Units Subcutaneous Nightly Lester Kinsman, MD       ??? DULoxetine (CYMBALTA) extended release capsule 30 mg  30 mg Oral Daily Lester Kinsman, MD   30 mg at 05/31/17 0845   ??? pantoprazole sodium (PROTONIX) packet 40 mg  40 mg Oral QAM AC Lester Kinsman, MD   40 mg at 05/31/17 0852   ??? sodium chloride flush 0.9 % injection 10 mL  10 mL Intravenous 2 times per day Lester Kinsman, MD   10 mL at 05/31/17 0844   ??? sodium chloride flush 0.9 % injection 10 mL  10 mL Intravenous PRN Lester Kinsman, MD       ??? magnesium hydroxide (MILK OF MAGNESIA) 400 MG/5ML suspension 30 mL  30 mL Oral Daily PRN Lester Kinsman, MD       ??? ondansetron (ZOFRAN) injection 4 mg  4 mg Intravenous Q6H PRN Lester Kinsman, MD   4 mg at 05/31/17 0322   ??? aspirin chewable tablet 81 mg  81 mg Oral Daily Lester Kinsman, MD   81 mg at 05/31/17 0845   ??? enoxaparin (LOVENOX) injection 40 mg  40 mg Subcutaneous Daily Lester Kinsman, MD   40 mg at 05/31/17 0844   ??? metronidazole (FLAGYL) 500 mg in NaCl 100 mL IVPB premix  500 mg Intravenous Q8H Lester Kinsman, MD   Stopped at 05/31/17 1412   ??? dicyclomine (BENTYL) capsule 10 mg  10 mg Oral TID PRN Barbette Merino, MD   10 mg at 05/31/17 1629   ??? ciprofloxacin (CIPRO) IVPB 400 mg  400 mg Intravenous Q12H Huston Foley, MD 200 mL/hr at 05/31/17 1629 400 mg at 05/31/17 1629   ??? sodium chloride flush 0.9 % injection 10 mL  10 mL Intravenous BID Meliton Rattan, MD   10 mL at 05/30/17 2127   ??? morphine sulfate (PF)  injection 4 mg  4 mg Intravenous Q3H PRN Lester Kinsman, MD   4 mg at 05/31/17 1629       Allergies:  Patient has no known allergies.    Social History:   Social History     Socioeconomic History   ??? Marital status: Single     Spouse name: None   ??? Number of children: None   ??? Years of education: None   ??? Highest education level: None   Occupational History   ??? None   Social Needs   ??? Financial resource strain: None   ??? Food insecurity:     Worry: None     Inability: None   ??? Transportation needs:     Medical: None     Non-medical: None   Tobacco Use   ??? Smoking status: Current Every Day Smoker     Packs/day: 1.00     Types: Cigarettes   ??? Smokeless tobacco: Never Used   Substance and Sexual Activity   ??? Alcohol use: No   ??? Drug use: Yes     Frequency: 3.0 times per week     Types: Marijuana   ??? Sexual activity: None   Lifestyle   ??? Physical activity:     Days per week: None     Minutes per session: None   ??? Stress: None   Relationships   ??? Social connections:     Talks on phone: None     Gets together: None     Attends religious service: None     Active member of club or organization: None     Attends meetings of clubs or organizations: None     Relationship status: None   ??? Intimate partner violence:     Fear of current or ex partner: None     Emotionally abused: None     Physically abused: None  Forced sexual activity: None   Other Topics Concern   ??? None   Social History Narrative   ??? None       Family History:   History reviewed. No pertinent family history.    REVIEW OFSYSTEMS:    Review of Systems   Constitutional: Negative.    HENT: Negative.    Eyes: Negative.    Respiratory: Negative.    Cardiovascular: Negative.    Gastrointestinal: Positive for abdominal pain, nausea and vomiting. Negative for abdominal distention, anal bleeding, blood in stool, constipation, diarrhea and rectal pain.   Endocrine: Negative.    Genitourinary: Negative.    Musculoskeletal: Negative.    Skin: Negative.     Allergic/Immunologic: Negative.    Hematological: Negative.    Psychiatric/Behavioral: Negative.        PHYSICAL EXAM:  Vitals:    05/31/17 0324 05/31/17 0830 05/31/17 0842 05/31/17 1430   BP: (!) 170/84 (!) 177/86  (!) 140/76   Pulse: 76 69 70 72   Resp: 15 10  12    Temp: 98.3 ??F (36.8 ??C) 97.4 ??F (36.3 ??C)  98 ??F (36.7 ??C)   TempSrc: Oral Oral  Oral   SpO2: 99% 96%     Weight: 188 lb 8 oz (85.5 kg) 186 lb 5 oz (84.5 kg)     Height:  5\' 7"  (1.702 m)         Physical Exam   Constitutional: He is oriented to person, place, and time. He appears well-developed and well-nourished. No distress.   HENT:   Head: Normocephalic and atraumatic.   Mouth/Throat: No oropharyngeal exudate.   Eyes: Pupils are equal, round, and reactive to light. Conjunctivae and EOM are normal. Right eye exhibits no discharge. Left eye exhibits no discharge. No scleral icterus.   Neck: Neck supple. No thyromegaly present.   Cardiovascular: Regular rhythm.   Pulmonary/Chest: Breath sounds normal. No respiratory distress.   Abdominal: Soft. He exhibits no distension and no mass. There is no tenderness. There is no rebound and no guarding. No hernia.   Musculoskeletal: Normal range of motion. He exhibits no edema, tenderness or deformity.   Neurological: He is alert and oriented to person, place, and time.   Skin: Skin is warm and dry. Capillary refill takes less than 2 seconds. He is not diaphoretic.   Psychiatric: He has a normal mood and affect.   Nursing note and vitals reviewed.        DATA:    CBC:   Lab Results   Component Value Date    WBC 16.9 05/31/2017    RBC 4.65 05/31/2017    HGB 12.3 05/31/2017    HCT 40.7 05/31/2017    MCV 87.5 05/31/2017    MCH 26.5 05/31/2017    MCHC 30.2 05/31/2017    RDW 16.6 05/31/2017    PLT 357 05/31/2017    MPV 10.3 05/31/2017     CBC with Differential:    Lab Results   Component Value Date    WBC 16.9 05/31/2017    RBC 4.65 05/31/2017    HGB 12.3 05/31/2017    HCT 40.7 05/31/2017    PLT 357 05/31/2017    MCV  87.5 05/31/2017    MCH 26.5 05/31/2017    MCHC 30.2 05/31/2017    RDW 16.6 05/31/2017    SEGSPCT 46.6 05/30/2017    LYMPHOPCT 43.8 05/30/2017    MONOPCT 5.5 05/30/2017    BASOPCT 0.6 05/30/2017    MONOSABS 1.2 05/30/2017  LYMPHSABS 9.2 05/30/2017    EOSABS 0.6 05/30/2017    BASOSABS 0.1 05/30/2017    DIFFTYPE AUTOMATED DIFFERENTIAL 05/30/2017     CMP:    Lab Results   Component Value Date    NA 138 05/30/2017    K 4.6 05/30/2017    CL 99 05/30/2017    CO2 27 05/30/2017    BUN 17 05/30/2017    CREATININE 1.2 05/30/2017    GFRAA >60 05/30/2017    LABGLOM >60 05/30/2017    GLUCOSE 192 05/30/2017    PROT 7.0 05/30/2017    LABALBU 4.5 05/30/2017    CALCIUM 9.2 05/30/2017    BILITOT 0.2 05/30/2017    ALKPHOS 60 05/30/2017    AST 15 05/30/2017    ALT 27 05/30/2017     BMP:    Lab Results   Component Value Date    NA 138 05/30/2017    K 4.6 05/30/2017    CL 99 05/30/2017    CO2 27 05/30/2017    BUN 17 05/30/2017    LABALBU 4.5 05/30/2017    CREATININE 1.2 05/30/2017    CALCIUM 9.2 05/30/2017    GFRAA >60 05/30/2017    LABGLOM >60 05/30/2017    GLUCOSE 192 05/30/2017     Hepatic Function Panel:    Lab Results   Component Value Date    ALKPHOS 60 05/30/2017    ALT 27 05/30/2017    AST 15 05/30/2017    PROT 7.0 05/30/2017    BILITOT 0.2 05/30/2017    LABALBU 4.5 05/30/2017     Troponin:  No results found for: TROPONINI  LIPASE:    Lab Results   Component Value Date    LIPASE 51 05/30/2017       CT A/P:  1. No acute abdominopelvic abnormality.   2. Small hiatal hernia.   3. Minimal sigmoid diverticulosis.     CTA A/P:  1. Suspected moderate to high-grade stenosis at the origin of the inferior   mesenteric artery. ??The celiac and superior mesenteric arteries are widely   patent.   2. High-grade short-segment stenosis at the origin of the right external   iliac artery. ??Beyond the stenosis, the right external iliac artery is   diffusely narrowed by plaque.   3. 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       IMPRESSION:        Patient Active Problem List:     Chest pain     Lower abdominal pain     Anemia      57 y/o M with acute on chronic abdominal pain    PLAN:    -Low suspicion for acute mesenteric ischemia given physical exam, CTA findings (widely patent celiac and SMA), and normal lactic acid.     -Diet as tolerated.    -Agree with GI c/s and recs. Pt does have hx of pancreatitis from chronic EtOH use (states he has been sober for 3 years), IBS, and diverticulitis.     -Leukocytosis- pt currently on ABx. Could be from subclinical diverticulitis; however, the colon has been unimpressive on both CTA and CT.    -Currently, no acute surgical intervention warranted. Will continue to follow. Please call with any concerns or questions. Thank you.         Arcola Jansky, MD

## 2017-05-31 NOTE — Plan of Care (Signed)
Problem: Falls - Risk of:  Goal: Will remain free from falls  Description  Will remain free from falls  Outcome: Ongoing  Goal: Absence of physical injury  Description  Absence of physical injury  Outcome: Ongoing

## 2017-05-31 NOTE — Consults (Signed)
CONSULTATION  ??  PATIENT NAME: Ricardo Foley, Ricardo Foley                    DOB:        03/18/1960  MED REC NO:   [id number]                          ROOM:       3008  ACCOUNT NO:   [id number]                           ADMIT DATE: 05/30/2017  PROVIDER:     Barbette Merino, MD  ??  CONSULT DATE:  05/31/2017  ??  REFERRING PHYSICIAN:  Dr. Barrett Henle, the hospitalist.  ??  REASON FOR CONSULTATION:  Chronic abdominal pain on the lower abdomen.  ??  HISTORY OF PRESENT ILLNESS:  A 57 year old Caucasian male patient who had a past medical history of pancreatitis, diverticulosis, hypertension, hypercholesterolemia, coronary artery disease, diabetes, and chronic use of marijuana, was admitted to our hospital because of chronic abdominal pain on the lower abdomen.  The patient reported that he did have chronic abdominal pain on lower abdomen over past 10 years, which was intermittent abdominal pain.  Over past six weeks, he had recurrent intermittent lower abdominal pain, which was located on the  bilateral lower abdomen and middle lower abdomen.  In addition, he also had nausea and nonbloody emesis.  He still had a regular bowel movement. He moved bowels normally once per day, occasionally he might have constipation.  Sometimes he could not move bowels for five days.  He denied hematochezia or melena.  Taking hot shower and hot bath could significantly relieve the lower abdominal pain, nausea and vomiting.  He has been using marijuana at least every other day for over past 30 years.  He reported that he did have upper endoscopy and colonoscopy one year ago at other facility, the report was not available to me at this time.  Per patient report, both upper endoscopy and colonoscopy were normal.  We also did CT abdomen and pelvis with IV contrast on 05/11/2017 and 05/30/2017.  Both CT of abdomen and pelvis did not show any abnormalities other than diverticulosis  in the colon.  In addition the CT yesterday also showed normal gallbladder, normal biliary system, and normal pancreas.  The patient did have acid reflux, but patient denied dysphagia or odynophagia.  The patient lost five pounds over the past six weeks.  The patient denied abdominal bloating, abdominal gas sensation, or abdominal girth increase.  The patient denied cough, shortness of breath, dysuria, or urinary urgency.  The patient denied jaundice, rash, or itchiness on his skin.  There was no fever or chill.         PMH:    Past Medical History:   Diagnosis Date   ??? CAD (coronary artery disease)    ??? Diabetes ([place])    ??? Hyperlipidemia    ??? Hypertension    ??? Nerve pain    ??? Scoliosis        PSH:    Past Surgical History:   Procedure Laterality Date   ??? PR SONO GUIDE NEEDLE BIOPSY         Medications:    No current facility-administered medications on file prior to encounter.      Current Outpatient Medications on File Prior to Encounter   Medication Sig Dispense Refill   ???  dicyclomine (BENTYL) 10 MG capsule Take 2 capsules by mouth 4 times daily (before meals and nightly) 30 capsule 0   ??? ondansetron (ZOFRAN ODT) 4 MG disintegrating tablet Take 1 tablet by mouth every 6 hours 10 tablet 0   ??? metFORMIN (GLUCOPHAGE) 1000 MG tablet Take 1,000 mg by mouth 2 times daily (with meals)     ??? hydrOXYzine (VISTARIL) 50 MG capsule Take 50 mg by mouth 3 times daily as needed for Itching     ??? pantoprazole sodium (PROTONIX) 40 MG PACK packet Take 40 mg by mouth every morning (before breakfast)     ??? omeprazole (PRILOSEC) 20 MG delayed release capsule Take 20 mg by mouth daily     ??? atorvastatin (LIPITOR) 20 MG tablet Take 20 mg by mouth daily     ??? cyclobenzaprine (FLEXERIL) 10 MG tablet Take 1 tablet by mouth 3 times daily as needed for Muscle spasms 20 tablet 0   ??? DULoxetine (CYMBALTA) 30 MG extended release capsule Take 1 capsule by mouth daily 30 capsule 0   ??? gabapentin (NEURONTIN) 300 MG capsule Take 1 capsule by mouth  3 times daily for 10 days.. 30 capsule 0       Allergies: No Known Allergies    Social History:    Social History     Socioeconomic History   ??? Marital status: Single     Spouse name: Not on file   ??? Number of children: Not on file   ??? Years of education: Not on file   ??? Highest education level: Not on file   Occupational History   ??? Not on file   Social Needs   ??? Financial resource strain: Not on file   ??? Food insecurity:     Worry: Not on file     Inability: Not on file   ??? Transportation needs:     Medical: Not on file     Non-medical: Not on file   Tobacco Use   ??? Smoking status: Current Every Day Smoker     Packs/day: 1.00     Types: Cigarettes   ??? Smokeless tobacco: Never Used   Substance and Sexual Activity   ??? Alcohol use: No   ??? Drug use: Yes     Frequency: 3.0 times per week     Types: Marijuana   ??? Sexual activity: Not on file   Lifestyle   ??? Physical activity:     Days per week: Not on file     Minutes per session: Not on file   ??? Stress: Not on file   Relationships   ??? Social connections:     Talks on phone: Not on file     Gets together: Not on file     Attends religious service: Not on file     Active member of club or organization: Not on file     Attends meetings of clubs or organizations: Not on file     Relationship status: Not on file   ??? Intimate partner violence:     Fear of current or ex partner: Not on file     Emotionally abused: Not on file     Physically abused: Not on file     Forced sexual activity: Not on file   Other Topics Concern   ??? Not on file   Social History Narrative   ??? Not on file       Family History:  History reviewed. No pertinent family  history.      Review of Systems:  Constitutional: No weight loss, no fevers.  Eyes: No problems with vision.  ENT: No nose or sinus problems, no oral problems, no throat problems or hoarseness.  Cardiovascular: No chest pain, no leg pain with walking, no palpitations, no ankle swelling.  Respiratory: No shortness of breath, no persistent  cough, no wheezing.  Endocrine: No increased thirst, no increased urination.  Gastrointestinal: + heartburn, no dysphagia, + abdominal pain, no loss of appetite, no nausea or vomiting, no diarrhea, no constipation, no melena, no hematochezia, no sceleral icterus or jaundice.  Skin: No rashes.  Musculoskeletal: No trouble walking or standing, no joint pain, no muscle pain.  Allergy/Immune System: No allergies, no frequent infections.  Neurological: No memory difficulties, no temporary blindness, no difficulty speaking, no headaches, no numbness.  Psychiatric: No depression, no suicidal ideation, no auditory hallucinations.  Hematological/Lymphatic: No lymphadenopathy, no frequent nose bleeds, no easy bruising.  Genitourinary: No penile/vaginal discharge, no pain with urination, no trouble starting urinary stream, no hematuria.    Physical Examination  Vital Signs: BP (!) 177/86    Pulse 70    Temp 97.4 ??F (36.3 ??C) (Oral)    Resp 10    Ht 5' 7" (1.702 m)    Wt 186 lb 5 oz (84.5 kg)    SpO2 96%    BMI 29.18 kg/m??  Body mass index is 29.18 kg/m??.    General: The patient is a 57 y.o. male in No acute distress.  EYE: EOMI, Gross visual field was normal. Pupils reactive, The conjunctive was normal, with no erythema.  ENT: no lymphadenopathy, oropharynx is without erythema, edema, or exudates, and moist mucus membranes. The nasal mucosa, septum and turbinates were normal without inflammation or edema.  Neck: There was no mass on palpitation, tracheal position was in the middle of the neck and there was no enlarged thyroid.  There was no JVD.  Lungs: The respiratory was not in labor and the patient did not use accessory muscle. Clear to auscultation bilaterally, no wheeze/crackles.  Cardiovascular: Regular rate and rhythm, normal S1 & S2, no murmurs, rubs or gallops appreciated. Peripheral pulses were normal and no tenderness.  Abdomen: Soft, very mild tender on the lower abd, no rebound or guarding or peritoneal features,  no masses, no hepatosplenomegaly. BS was normal  Extremities: upper and lower extremities were warm and dry, no clubbing, cyanosis, edema.  Neuro: CN II-XII were intact grossly. Sensation was normal on all extremities and the muscle strength was normal and symmetry.  Rectum: There was no fistular, fissure, external hemorrhoid, tenderness, abscess, erythema or discharge    Labs:  CBC  WBC   Date Value Ref Range Status   05/31/2017 16.9 (H) 4.0 - 10.5 K/CU MM Final     Hemoglobin   Date Value Ref Range Status   05/31/2017 12.3 (L) 13.5 - 18.0 GM/DL Final     Hematocrit   Date Value Ref Range Status   05/31/2017 40.7 (L) 42 - 52 % Final     MCV   Date Value Ref Range Status   05/31/2017 87.5 78 - 100 FL Final        Glucose   Date Value Ref Range Status   05/30/2017 192 (H) 70 - 99 MG/DL Final     CO2   Date Value Ref Range Status   05/30/2017 27 21 - 32 MMOL/L Final     BUN   Date Value Ref Range Status  05/30/2017 17 6 - 23 MG/DL Final     Lab Results   Component Value Date    ALT 27 05/30/2017    AST 15 05/30/2017     No results found for: AMYLASE  Lab Results   Component Value Date    LIPASE 51 05/30/2017     No results found for: ESR  No components found for: CREACTIVEPR  No results found for: ANA No components found for: ANTISMA, ANTIMITO  No results found for: CEA  No components found for: OCCULTBLOOD, OCCBLDSINPOC, OCCULTBLOODS, OCCBLD1, OCCBLD2, OCCBLD3, OCCULTBLOOD  No results found for: IRON, FERRITIN  No results found for: HAV    Assessment     Assessment and Plan:    A 57 year old Caucasian male patient who had past medical history of chronic use of marijuana, hypercholesterolemia,  hypertension, diabetes, history of pancreatitis, diverticulosis, presented with chronic low abdominal pain over past 10 years that had been recurrent over past six weeks.  His CT scan was unremarkable other than diverticulosis.  He reports that nausea, vomiting, and lower abdominal pain could be significant relieved by having  hot bath or hot  Shower.    1.  Intermittent lower abdominal pain over past 10 years that had been recurrent over past six weeks, was most likely secondary to cannabinoid hyperemesis syndrome due to chronic marijuana use.  Other possible diagnosis would be IBS, constipation.  Unlikely he has ischemic bowel disease or inflammatory bowel disease or other infectious enteritis, or bowel obstruction because his CT scan was unremarkable, there was no bowel wall thickening, his BM was normal and had not had GI bleeding or diarrhea,  and his bowel movements were regular.  I recommended stopping marijuana.  I also recommend to obtain the records of upper endoscopy, colonoscopy one year ago at other facility.  I already talked to the floor nurse and will ask her to obtain the EGD and colonoscopy report.  I will also give patient Bentyl to symptomatically treat his abdominal pain.    In addition, I called the surgeon to do a consult.  I also ordered a CTA to make sure the celiac trunk, SMA or IMA were patent.     2.  Leukocytosis.  The patient has chronic leukocytosis with unknown etiology.  Unlikely it was due to GI etiology.  It might be due to marijuana use.  However, I would recommend consulting a hematologist for chronic leukocytosis.    3.  Mild anemia.  The patient did not have sign of GI bleeding.  I would recommend obtaining previous records of upper endoscopy and colonoscopy. I also recommended consultinga hematologist.    4.  Chronic use of marijuana.  I had advised him to stop marijuana, but the patient was very hesitated to stop marijuana.     Thank you very much for interesting case.  ??  The patient should follow with his outpatient GI doctor whom he is going to see in next two weeks.  ??    Addendum:    [facility] EGD and colonoscopy reports:    11/17/16, moderate or severe gastritis, but improved. (biopies were taken, but the path report said that there was no specimens).      08/18/16, EGD: severe gastritis with  erosions,     08/18/16, colonoscopy normal colon, ? Proctitis    Biopsies on 7/27: mild chronic gastritis with NO H Pylori, rectal biopsies "no significant histopathologic changes"    Barbette Merino MD PhD [facility]  [address] [address]  59fice: [phone] Fax: [phone]

## 2017-05-31 NOTE — Progress Notes (Signed)
Full consult to follow.    CT reviewed with Dr. Delane Ginger.   Pt with IMA high grade stenosis but widely patent SMA and celiac arteries.   Pt also has renal artery stenosis.   This is unlikely the cause of abdominal pain as he has good collateral flow.

## 2017-05-31 NOTE — Plan of Care (Signed)
Nutrition Problem: Inadequate oral intake  Intervention: Food and/or Nutrient Delivery: Continue current diet, Start ONS  Nutritional Goals: Patient will meet at least 75% of estimated nutrient needs during los with meals and supplements provided.

## 2017-05-31 NOTE — Progress Notes (Signed)
Nutrition Assessment    Type and Reason for Visit: Initial, Positive Nutrition Screen(poor intake, wt loss, no teeth, N/V)    Nutrition Recommendations:    Continue current diet   Begin low cal high protein oral nutrition supplement bid, between meals as needeed    Nutrition Assessment: Moderate nutrition risk 2/2 poor intake 6 weeks PTA with abdominal pain, nausea. Consuming bites only so far here. No physical signs of malnutrition noted. Willing to try oral supplements here, between meals recommended.    Malnutrition Assessment:   Malnutrition Status: At risk for malnutrition    Nutrition Risk Level: Moderate    Nutrient Needs:   Estimated Daily Total Kcal: 5573-2202   Estimated Daily Protein (g): 67-100 (1-1.5 g/kg IBW)   Estimated Daily Total Fluid (ml/day): 5427-0623 (1 ml/kcal)    Nutrition Diagnosis:    Problem: Inadequate oral intake   Etiology: related to Alteration in GI function, Nausea    . Signs and symptoms:  as evidenced by Diet history of poor intake, Intake 0-25%, Patient report of, Weight loss    Objective Information:   Wound Type: None   Current Nutrition Therapies:   Oral Diet Orders: Carb Control 4 Carbs/Meal(no caffeine)    Oral Diet intake: 1-25%   Oral Nutrition Supplement (ONS) Orders: None   Anthropometric Measures:   Ht: 5\' 7"  (170.2 cm)    Current Body Wt: 186 lb 5 oz (84.5 kg)   Admission Body Wt: 188 lb (85.3 kg)   Usual Body Wt: 192 lb (87.1 kg)(02/22/17, stated)   % Weight Change:  no significant loss noted   Ideal Body Wt: 148 lb (67.1 kg), % Ideal Body 126   BMI Classification: BMI 25.0 - 29.9 Overweight(29.2)    Nutrition Interventions:   Continue current diet, Start ONS  Continued Inpatient Monitoring, Education not appropriate at this time, Coordination of Care    Nutrition Evaluation:    Evaluation: Goals set    Goals:   Patient will meet at least 75% of estimated nutrient needs during los with meals and supplements provided.     Monitoring: Meal  Intake, Supplement Intake, Diet Tolerance, Weight, Pertinent Labs, Chewing/Swallowing, Nausea or Vomiting      Electronically signed by Helane Gunther, RD, LD on 05/31/17 at 10:52 AM    Contact Number: [phone]

## 2017-05-31 NOTE — Consults (Signed)
CARDIOLOGY CONSULT NOTE ??  Reason for consultation: Chest pain  ??  Referring physician: Lester Kinsman, MD   ??  Primary care physician: Hazel Sams, APRN - CNP  ??  ??  Dear Lester Kinsman, MD   Thanks for the consult.  ??  History of present illness:Ricardo Foley is a 57 y.o.year old who  presents with  chest pain for last few days, happening daily, intermittent for 15 to 20 mins and aggravated with activity substernal also,reproducible with palpation, radiated to shoulder, 6/10, tender to touch,associated with shortness of breath, + sweating, nausea, did not get NTG in ED.  No fever, no chills, no nausea no vomiting. Blood pressure, cholesterol, blood glucose and weight are well controlled.  ??  Chief Complaint   Patient presents with   ??? Abdominal Pain     for the past 5-6 weeks, has appt. with GI dr on 5/22 but unable to wait   ??? Chest Pain     onset this morning       Past medical history:    has a past medical history of CAD (coronary artery disease), Diabetes ([place]), Hyperlipidemia, Hypertension, Nerve pain, and Scoliosis.  Past surgical history:   has a past surgical history that includes pr sono guide needle biopsy.  Social History:   reports that he has been smoking cigarettes.  He has been smoking about 1.00 pack per day. He has never used smokeless tobacco. He reports that he has current or past drug history. Drug: Marijuana. Frequency: 3.00 times per week. He reports that he does not drink alcohol.  Family history:   no family history of CAD, STROKE of DM      atorvastatin (LIPITOR) tablet 20 mg Daily   cyclobenzaprine (FLEXERIL) tablet 10 mg TID PRN   dicyclomine (BENTYL) capsule 20 mg 4x Daily AC & HS   glucose (GLUTOSE) 40 % oral gel 15 g PRN   dextrose 50 % solution 12.5 g PRN   glucagon (rDNA) injection 1 mg PRN   dextrose 5 % solution PRN   insulin lispro (HUMALOG) injection vial 0-12 Units TID WC   insulin lispro (HUMALOG) injection vial 0-6 Units Nightly   DULoxetine (CYMBALTA) extended release capsule 30  mg Daily   pantoprazole sodium (PROTONIX) packet 40 mg QAM AC   sodium chloride flush 0.9 % injection 10 mL 2 times per day   sodium chloride flush 0.9 % injection 10 mL PRN   magnesium hydroxide (MILK OF MAGNESIA) 400 MG/5ML suspension 30 mL Daily PRN   ondansetron (ZOFRAN) injection 4 mg Q6H PRN   aspirin chewable tablet 81 mg Daily   enoxaparin (LOVENOX) injection 40 mg Daily   levofloxacin (LEVAQUIN) 500 MG/100ML infusion 500 mg Q24H   metronidazole (FLAGYL) 500 mg in NaCl 100 mL IVPB premix Q8H   sodium chloride flush 0.9 % injection 10 mL BID   morphine sulfate (PF) injection 4 mg Q3H PRN     Current Facility-Administered Medications   Medication Dose Route Frequency Provider Last Rate Last Dose   ??? atorvastatin (LIPITOR) tablet 20 mg  20 mg Oral Daily Lester Kinsman, MD   20 mg at 05/31/17 0845   ??? cyclobenzaprine (FLEXERIL) tablet 10 mg  10 mg Oral TID PRN Lester Kinsman, MD       ??? dicyclomine (BENTYL) capsule 20 mg  20 mg Oral 4x Daily AC & HS Lester Kinsman, MD   20 mg at 05/31/17 0752   ??? glucose (GLUTOSE) 40 % oral gel  15 g  15 g Oral PRN Lester Kinsman, MD       ??? dextrose 50 % solution 12.5 g  12.5 g Intravenous PRN Lester Kinsman, MD       ??? glucagon (rDNA) injection 1 mg  1 mg Intramuscular PRN Lester Kinsman, MD       ??? dextrose 5 % solution  100 mL/hr Intravenous PRN Lester Kinsman, MD       ??? insulin lispro (HUMALOG) injection vial 0-12 Units  0-12 Units Subcutaneous TID WC Lester Kinsman, MD       ??? insulin lispro (HUMALOG) injection vial 0-6 Units  0-6 Units Subcutaneous Nightly Lester Kinsman, MD       ??? DULoxetine (CYMBALTA) extended release capsule 30 mg  30 mg Oral Daily Lester Kinsman, MD   30 mg at 05/31/17 0845   ??? pantoprazole sodium (PROTONIX) packet 40 mg  40 mg Oral QAM AC Lester Kinsman, MD   40 mg at 05/31/17 0852   ??? sodium chloride flush 0.9 % injection 10 mL  10 mL Intravenous 2 times per day Lester Kinsman, MD   10 mL at 05/31/17 0844   ??? sodium chloride flush 0.9 % injection 10 mL  10 mL Intravenous PRN Lester Kinsman, MD       ??? magnesium hydroxide (MILK OF MAGNESIA) 400 MG/5ML suspension 30 mL  30 mL Oral Daily PRN Lester Kinsman, MD       ??? ondansetron (ZOFRAN) injection 4 mg  4 mg Intravenous Q6H PRN Lester Kinsman, MD   4 mg at 05/31/17 0322   ??? aspirin chewable tablet 81 mg  81 mg Oral Daily Lester Kinsman, MD   81 mg at 05/31/17 0845   ??? enoxaparin (LOVENOX) injection 40 mg  40 mg Subcutaneous Daily Lester Kinsman, MD   40 mg at 05/31/17 0844   ??? levofloxacin (LEVAQUIN) 500 MG/100ML infusion 500 mg  500 mg Intravenous Q24H Lester Kinsman, MD   Stopped at 05/31/17 0506   ??? metronidazole (FLAGYL) 500 mg in NaCl 100 mL IVPB premix  500 mg Intravenous Q8H Lester Kinsman, MD   Stopped at 05/31/17 0547   ??? sodium chloride flush 0.9 % injection 10 mL  10 mL Intravenous BID Meliton Rattan, MD   10 mL at 05/30/17 2127   ??? morphine sulfate (PF) injection 4 mg  4 mg Intravenous Q3H PRN Lester Kinsman, MD   4 mg at 05/31/17 0844       ??  Review of Systems: ??  ?? Constitutional: No Fever or Weight Loss ??  ?? Eyes: No Decreased Vision  ?? ENT: No Headaches, Hearing Loss or Vertigo  ?? Cardiovascular: + chest pain, dyspnea on exertion, palpitations or loss of consciousness  ?? Respiratory: No cough or wheezing ??  ?? Gastrointestinal: No abdominal pain, appetite loss, blood in stools, constipation, diarrhea or heartburn  ?? Genitourinary: No dysuria, trouble voiding, or hematuria  ?? Musculoskeletal: No gait disturbance, weakness or joint complaints  ?? Integumentary: No rash or pruritis  ?? Neurological: No TIA or stroke symptoms  ?? Psychiatric: No anxiety or depression  ?? Endocrine: No malaise, fatigue or temperature intolerance  ?? Hematologic/Lymphatic: No bleeding problems, blood clots or swollen lymph nodes  ?? Allergic/Immunologic: No nasal congestion or hives  All systems negative except as marked.   ??  ?? ??  ?? ??  ??      Physical Examination:    Vitals:    05/31/17 9629  BP: 177/86   Pulse: 70   Resp: 18   Temp: afebrile   SpO2:       Wt Readings from  Last 3 Encounters:   05/31/17 186 lb 5 oz (84.5 kg)   05/11/17 189 lb (85.7 kg)   02/22/17 192 lb (87.1 kg)     Body mass index is 29.18 kg/m??.     ??  General Appearance: No distress, conversant  ??  Constitutional: Well developed, Well nourished, No acute distress, Non-toxic appearance. ??  HENT:  Normocephalic, Atraumatic, Bilateral external ears normal, Oropharynx moist, No oral exudates, Nose normal. Neck- Normal range of motion, No tenderness, Supple, No stridor,no apical-carotid delay, no carotid bruit  Eyes: PERRL, EOMI, Conjunctiva normal, No discharge. ??  Respiratory:  Normal breath sounds, No respiratory distress, No wheezing, No chest tenderness.,no use of accessory muscles, diaphragm movement is normal  Cardiovascular: (PMI) apex non displaced,no lifts no thrills, no s3,no s4, Normal heart rate, Normal rhythm, No murmurs, No rubs, No gallops. Carotid arteries pulse and amplitude are normal no bruit, no abdominal bruit noted ( normal abdominal aorta ausculation), femoral arteries pulse and amplitude are normal no bruit, pedal pulses are normal.  GI: Bowel sounds normal, Soft, No tenderness, No masses, No pulsatile masses, no hepatosplenomegally, no bruits  GU: External genitalia appear normal, No masses or lesions. No discharge. No CVA tenderness. ??  Musculoskeletal: Intact distal pulses, No edema, No tenderness, No cyanosis, No clubbing. Good range of motion in all major joints. No tenderness to palpation or major deformities noted. Back- No tenderness.   Integument: Warm, Dry, No erythema, No rash.   Skin: no rash, no ulcers  Lymphatic: No lymphadenopathy noted. ??  Neurologic: Alert & oriented x 3, Normal motor function, Normal sensory function, No focal deficits noted. ??  Psychiatric: Affect normal, Judgment normal, Mood normal.   Lab Review ??      Recent Labs??   ???? 10/21/14  0010??   WBC?? 12.3*??   HGB?? 14.4??   HCT?? 43.8??   PLT?? 316??    ??      Recent Labs??   ???? 10/21/14  0010??   NA?? 136??   K?? 3.9??   CL??  95*??   CO2?? 23??   BUN?? 10??   CREATININE?? 0.8??   ??      Recent Labs??   ???? 10/21/14  0010??   AST?? 12*??   ALT?? 10??   BILITOT?? 0.4??   ALKPHOS?? 124??   ??  No results for input(s): TROPONINI in the last 72 hours.  No results found for: BNP  No results found for: INR, PROTIME  ??  ??  EKG:nsr  ??  Chest Xray:nad  ??  [place]  Labs, echo, meds reviewed  Assessment:   ??  Recommendations:  ??  1. Chest pain:atypical, tenderness of the chest wall and has abdominal tenderness also, will get echo here and once discharged, will follow up in office to see if he needs stress test  2. Elevated WBC: patient always has elevated WBC, R/o blood disorder  3. HTN: uncontrolled, start norvasc 5mg  daily, if needed add HCTZ  4. DM: CONTINUE insulin  5. Dyslipidemia; stable, continue statins  6. Health maintenance: exerise and diet  All labs, medications and tests reviewed, continue all other medications of all above medical condition listed as is.   ??   ??  Doug Sou, MD,   Doug Sou, MD, 05/31/2017 9:17 AM

## 2017-05-31 NOTE — Progress Notes (Addendum)
Hospitalist Progress Note         Admit Date: 05/30/2017    PCP: Hazel Sams, APRN - CNP     Chief Complaint   Patient presents with   ??? Abdominal Pain     for the past 5-6 weeks, has appt. with GI dr on 5/22 but unable to wait   ??? Chest Pain     onset this morning        Assessment and Plan:     ??? Chest pain: Cardiology evaluated and pending echo.  No ACS.  Outpatient stress test.    Continue current aspirin, statin and maintain good BP control.  ??? Diffuse nonspecific abdominal pain/leukocytosis: I don't see any evidence of diverticulitis on CT.    However given his clinical symptoms I would continue with current antibiotics Cipro/Flagyl.    I think the leukocytosis could be stress related.  Continue monitor clinically, vitals and CBC  ??? Nausea vomiting and abdominal pain more than likely from cannabis long term use.    Continue nausea and pain control.  However patient is not happy with current assessment.  ??? Diabetes mellitus: A1c 7.2.  Continue sliding scale.  Follow Accu-Cheks    VTE prophylaxis LMWH    Current Facility-Administered Medications   Medication Dose Route Frequency Provider Last Rate Last Dose   ??? atorvastatin (LIPITOR) tablet 20 mg  20 mg Oral Daily Lester Kinsman, MD   20 mg at 05/31/17 0845   ??? cyclobenzaprine (FLEXERIL) tablet 10 mg  10 mg Oral TID PRN Lester Kinsman, MD       ??? dicyclomine (BENTYL) capsule 20 mg  20 mg Oral 4x Daily AC & HS Lester Kinsman, MD   20 mg at 05/31/17 1223   ??? glucose (GLUTOSE) 40 % oral gel 15 g  15 g Oral PRN Lester Kinsman, MD       ??? dextrose 50 % solution 12.5 g  12.5 g Intravenous PRN Lester Kinsman, MD       ??? glucagon (rDNA) injection 1 mg  1 mg Intramuscular PRN Lester Kinsman, MD       ??? dextrose 5 % solution  100 mL/hr Intravenous PRN Lester Kinsman, MD       ??? insulin lispro (HUMALOG) injection vial 0-12 Units  0-12 Units Subcutaneous TID WC Lester Kinsman, MD   2 Units at 05/31/17 1224   ??? insulin lispro (HUMALOG) injection vial 0-6 Units  0-6 Units Subcutaneous Nightly  Lester Kinsman, MD       ??? DULoxetine (CYMBALTA) extended release capsule 30 mg  30 mg Oral Daily Lester Kinsman, MD   30 mg at 05/31/17 0845   ??? pantoprazole sodium (PROTONIX) packet 40 mg  40 mg Oral QAM AC Lester Kinsman, MD   40 mg at 05/31/17 0852   ??? sodium chloride flush 0.9 % injection 10 mL  10 mL Intravenous 2 times per day Lester Kinsman, MD   10 mL at 05/31/17 0844   ??? sodium chloride flush 0.9 % injection 10 mL  10 mL Intravenous PRN Lester Kinsman, MD       ??? magnesium hydroxide (MILK OF MAGNESIA) 400 MG/5ML suspension 30 mL  30 mL Oral Daily PRN Lester Kinsman, MD       ??? ondansetron (ZOFRAN) injection 4 mg  4 mg Intravenous Q6H PRN Lester Kinsman, MD   4 mg at 05/31/17 0322   ??? aspirin chewable tablet 81 mg  81 mg Oral Daily Lester Kinsman, MD  81 mg at 05/31/17 0845   ??? enoxaparin (LOVENOX) injection 40 mg  40 mg Subcutaneous Daily Lester Kinsman, MD   40 mg at 05/31/17 0844   ??? levofloxacin (LEVAQUIN) 500 MG/100ML infusion 500 mg  500 mg Intravenous Q24H Lester Kinsman, MD   Stopped at 05/31/17 0506   ??? metronidazole (FLAGYL) 500 mg in NaCl 100 mL IVPB premix  500 mg Intravenous Q8H Atul Kutwal, MD 100 mL/hr at 05/31/17 1213 500 mg at 05/31/17 1213   ??? dicyclomine (BENTYL) capsule 10 mg  10 mg Oral TID PRN Barbette Merino, MD       ??? sodium chloride flush 0.9 % injection 10 mL  10 mL Intravenous BID Meliton Rattan, MD   10 mL at 05/30/17 2127   ??? morphine sulfate (PF) injection 4 mg  4 mg Intravenous Q3H PRN Lester Kinsman, MD   4 mg at 05/31/17 1213       Subjective:     Patient complains of abdominal pain ongoing for 6 weeks.  He has intermittent vomiting and gets relief with hot showers.  He says he had been using marijuana for 45 years and why he should get symptoms now.  He denied any chest pain in the morning.  No acute events since admission      Objective:       Intake/Output Summary (Last 24 hours) at 05/31/2017 1300  Last data filed at 05/31/2017 1236  Gross per 24 hour   Intake --   Output 725 ml   Net -725 ml      Vitals:    Vitals:    05/31/17 0842   BP:    Pulse: 70   Resp:    Temp:    SpO2:      Physical Exam:  General Appearance:    In mild distress secondary to pain  Head:      Normocephalic, without obvious abnormality, atraumatic  Eyes:       Conjunctiva/corneas clear, EOM's intact  Lungs:    Clear to auscultation bilaterally, respirations unlabored  Heart:                 Regular rate and rhythm, S1 and S2 normal, no murmur,   rub or gallop  Abdomen:     Soft, diffuse nonspecific tenderness+, bowel sounds active, no masses, no organomegaly  Extremities:   Extremities normal, atraumatic, no cyanosis or edema   Neurological:   Grossly Intact.    Significant Diagnostic Studies:   DATA:    CBC   Recent Labs     05/30/17  1820 05/31/17  0309   WBC 21.0* 16.9*   HGB 12.4* 12.3*   HCT 39.4* 40.7*   PLT 416 357      BMP   Recent Labs     05/30/17  1820   NA 138   K 4.6   CL 99   CO2 27   BUN 17   CREATININE 1.2     LFT'S   Recent Labs     05/30/17  1820   AST 15   ALT 27   BILITOT 0.2   ALKPHOS 60     COAG No results for input(s): INR in the last 72 hours.  POC:   Lab Results   Component Value Date    POCGLU 143 05/31/2017    POCGLU 135 05/31/2017     HemoglobinA1C:  Lab Results   Component Value Date    LABA1C 7.2 05/31/2017  CARDIAC ENZYMES  No results for input(s): CKTOTAL, CKMB, CKMBINDEX, TROPONINI in the last 72 hours.  Troponin:   Recent Labs     05/30/17  1820 05/31/17  0309 05/31/17  1001   TROPONINT <0.010 <0.010 <0.010     BNP: No results for input(s): PROBNP in the last 72 hours.  U/A:    Lab Results   Component Value Date    COLORU YELLOW 05/11/2017    WBCUA <1 05/11/2017    RBCUA NONE SEEN 05/11/2017    MUCUS OCCASIONAL 05/11/2017    BACTERIA NEGATIVE 05/11/2017    CLARITYU CLEAR 05/11/2017    SPECGRAV 1.025 05/11/2017    LEUKOCYTESUR NEGATIVE 05/11/2017    BLOODU NEGATIVE 05/11/2017       Ct Abdomen Pelvis W Iv Contrast Additional Contrast? None    Result Date: 05/30/2017  EXAMINATION: CT OF THE ABDOMEN AND PELVIS WITH  CONTRAST 05/30/2017 9:31 pm TECHNIQUE: CT of the abdomen and pelvis was performed with the administration of intravenous contrast. Multiplanar reformatted images are provided for review. Dose modulation, iterative reconstruction, and/or weight based adjustment of the mA/kV was utilized to reduce the radiation dose to as low as reasonably achievable. COMPARISON: 05/11/2017 HISTORY: ORDERING SYSTEM PROVIDED HISTORY: ABDOMINAL PAIN TECHNOLOGIST PROVIDED HISTORY: Additional Contrast?->None Ordering Physician Provided Reason for Exam: abd pain Acuity: Unknown Type of Exam: Unknown Additional signs and symptoms: Abdominal Pain (for the past 5-6 weeks, has appt. with GI dr on 5/22 but unable to wait Relevant Medical/Surgical History: isovue 370 80 ml FINDINGS: Lower Chest:  Visualized portion of the lower chest demonstrates no acute abnormality. Organs: The liver, gallbladder, biliary system, pancreas, adrenal glands, spleen, and kidneys are normal.  There is no hydronephrosis or evidence of urinary tract calculus. GI/Bowel: No acute bowel abnormality.  Minimal sigmoid diverticulosis.  Small hiatal hernia.  Normal appendix. Pelvis: Urinary bladder and pelvic organs unremarkable. Peritoneum/Retroperitoneum: No free air or free fluid.  No abnormal lymph node.  Mild atherosclerotic disease.  No abdominal aortic aneurysm. Bones/Soft Tissues: No acute osseous or soft tissue abnormality.     1. No acute abdominopelvic abnormality. 2. Small hiatal hernia. 3. Minimal sigmoid diverticulosis.     Ct Abdomen Pelvis W Iv Contrast    Result Date: 05/11/2017  EXAMINATION: CT OF THE ABDOMEN AND PELVIS WITH CONTRAST 05/11/2017 3:59 pm TECHNIQUE: CT of the abdomen and pelvis was performed with the administration of intravenous contrast. Multiplanar reformatted images are provided for review. Dose modulation, iterative reconstruction, and/or weight based adjustment of the mA/kV was utilized to reduce the radiation dose to as low as reasonably  achievable. COMPARISON: None. HISTORY: ORDERING SYSTEM PROVIDED HISTORY: left lower abdominal pain TECHNOLOGIST PROVIDED HISTORY: Ordering Physician Provided Reason for Exam: lower abd pain Acuity: Acute Type of Exam: Initial Additional signs and symptoms: onset 5 days ago Relevant Medical/Surgical History: 75 ml isovue 370 FINDINGS: Lower Chest: The lung bases are clear and the heart size is normal. Organs: The liver, gallbladder, pancreas, and spleen are within normal limits.  No adrenal masses are identified and there is no hydronephrosis. GI/Bowel: There is no bowel obstruction, free-air, or free-fluid.  Mild to moderate stool is present within the colon.  The stomach and duodenum are within normal limits.  There is diverticulosis coli. Pelvis: No acute abnormality. Peritoneum/Retroperitoneum: No pathologically enlarged lymph nodes are identified.  There are mild vascular calcifications. Bones/Soft Tissues: No acute osseous abnormality is visualized     No acute abdominopelvic abnormality detected. Diverticulosis coli without evidence of diverticulitis  or colitis.     Xr Chest Portable    Result Date: 05/30/2017  EXAMINATION: ONE XRAY VIEW OF THE CHEST 05/30/2017 5:41 pm COMPARISON: None. HISTORY: ORDERING SYSTEM PROVIDED HISTORY: Chest pain TECHNOLOGIST PROVIDED HISTORY: Reason for exam:->Chest pain Ordering Physician Provided Reason for Exam: chest pain, abdomen pain Acuity: Acute Type of Exam: Initial Relevant Medical/Surgical History: htn, cad FINDINGS: The lungs are clear and well expanded.  Costophrenic angles are clear. Cardiac and mediastinal structures are unremarkable.  Osseous structures are intact.     No evidence of acute cardiopulmonary disease.           [place]  Rounding Hospitalist

## 2017-06-01 LAB — EKG 12-LEAD
Atrial Rate: 87 {beats}/min
Diagnosis: NORMAL
P Axis: 46 degrees
P-R Interval: 126 ms
Q-T Interval: 378 ms
QRS Duration: 82 ms
QTc Calculation (Bazett): 454 ms
R Axis: -15 degrees
T Axis: 56 degrees
Ventricular Rate: 87 {beats}/min

## 2017-06-01 LAB — CBC
Hematocrit: 42.7 % (ref 42–52)
Hemoglobin: 13.3 GM/DL — ABNORMAL LOW (ref 13.5–18.0)
MCH: 26.9 PG — ABNORMAL LOW (ref 27–31)
MCHC: 31.1 % — ABNORMAL LOW (ref 32.0–36.0)
MCV: 86.3 FL (ref 78–100)
MPV: 10.7 FL (ref 7.5–11.1)
Platelets: 350 10*3/uL (ref 140–440)
RBC: 4.95 10*6/uL (ref 4.6–6.2)
RDW: 16 % — ABNORMAL HIGH (ref 11.7–14.9)
WBC: 15.6 10*3/uL — ABNORMAL HIGH (ref 4.0–10.5)

## 2017-06-01 LAB — SEDIMENTATION RATE: Sed Rate: 5 MM/HR (ref 0–20)

## 2017-06-01 LAB — C-REACTIVE PROTEIN: CRP, High Sensitivity: 0.5 mg/L

## 2017-06-01 LAB — POCT GLUCOSE
POC Glucose: 125 MG/DL — ABNORMAL HIGH (ref 70–99)
POC Glucose: 162 MG/DL — ABNORMAL HIGH (ref 70–99)
POC Glucose: 192 MG/DL — ABNORMAL HIGH (ref 70–99)
POC Glucose: 212 MG/DL — ABNORMAL HIGH (ref 70–99)

## 2017-06-01 MED ORDER — NICOTINE 21 MG/24HR TD PT24
21 MG/24HR | Freq: Every day | TRANSDERMAL | Status: DC
Start: 2017-06-01 — End: 2017-06-02
  Administered 2017-06-01 – 2017-06-02 (×2): 1 via TRANSDERMAL

## 2017-06-01 MED FILL — METRONIDAZOLE IN NACL 5-0.79 MG/ML-% IV SOLN: 5 mg/mL | INTRAVENOUS | Qty: 100

## 2017-06-01 MED FILL — DICYCLOMINE HCL 10 MG PO CAPS: 10 mg | ORAL | Qty: 2

## 2017-06-01 MED FILL — PROTONIX 40 MG PO PACK: 40 mg | ORAL | Qty: 1

## 2017-06-01 MED FILL — MORPHINE SULFATE 4 MG/ML IJ SOLN: 4 mg/mL | INTRAMUSCULAR | Qty: 1

## 2017-06-01 MED FILL — LOVENOX 40 MG/0.4ML SC SOLN: 40 MG/0.4ML | SUBCUTANEOUS | Qty: 0.4

## 2017-06-01 MED FILL — LIPITOR 20 MG PO TABS: 20 mg | ORAL | Qty: 1

## 2017-06-01 MED FILL — CIPROFLOXACIN IN D5W 400 MG/200ML IV SOLN: 400 MG/200ML | INTRAVENOUS | Qty: 200

## 2017-06-01 MED FILL — ONDANSETRON HCL 4 MG/2ML IJ SOLN: 4 MG/2ML | INTRAMUSCULAR | Qty: 2

## 2017-06-01 MED FILL — DULOXETINE HCL 30 MG PO CPEP: 30 mg | ORAL | Qty: 1

## 2017-06-01 MED FILL — NICOTINE 21 MG/24HR TD PT24: 21 mg/(24.h) | TRANSDERMAL | Qty: 1

## 2017-06-01 MED FILL — ASPIRIN 81 MG PO CHEW: 81 mg | ORAL | Qty: 1

## 2017-06-01 NOTE — Progress Notes (Signed)
General Surgery-Dr. Otho Ket Day: 3    Chief Complaint on Admission: lower abdominal pain      Subjective:     Pt still complains of lower abdominal pain intermittently. He states it is mostly after he voids or has a BM.    He is tolerating liquids. He states he is afraid / nervous to try solid foods.    Denies F/C. Denies N/V.    Otherwise, no complaints.     ROS:  Review of Systems    Allergies  Patient has no known allergies.          Diagnosis Date   ??? CAD (coronary artery disease)    ??? Diabetes ([place])    ??? Hyperlipidemia    ??? Hypertension    ??? Nerve pain    ??? Scoliosis        Objective:     Vitals:    06/01/17 0843   BP:    Pulse: 66   Resp:    Temp:    SpO2:        TEMPERATURE:  Current -Temp: 98 ??F (36.7 ??C); Max - Temp  Avg: 98 ??F (36.7 ??C)  Min: 97.6 ??F (36.4 ??C)  Max: 98.2 ??F (36.8 ??C)    No intake/output data recorded.I/O last 3 completed shifts:  In: 440 [P.O.:240; IV Piggyback:200]  Out: 3025 [Urine:3025]      Physical Exam:  Physical Exam  A&Ox3, NAD at rest.  AT. NC.  Breathing unlabored.  RRR.  S, ND, minimally tender lower but distractible, no PS  MAE. Intact motor and sensation b/l UE and LE  Warm, dry, non-jaundiced    Scheduled Meds:  ??? atorvastatin  20 mg Oral Daily   ??? dicyclomine  20 mg Oral 4x Daily AC & HS   ??? insulin lispro  0-12 Units Subcutaneous TID WC   ??? insulin lispro  0-6 Units Subcutaneous Nightly   ??? DULoxetine  30 mg Oral Daily   ??? pantoprazole sodium  40 mg Oral QAM AC   ??? sodium chloride flush  10 mL Intravenous 2 times per day   ??? aspirin  81 mg Oral Daily   ??? enoxaparin  40 mg Subcutaneous Daily   ??? metroNIDAZOLE  500 mg Intravenous Q8H   ??? ciprofloxacin  400 mg Intravenous Q12H   ??? sodium chloride flush  10 mL Intravenous BID     ContinuousInfusions:  ??? dextrose       PRN Meds:cyclobenzaprine, glucose, dextrose, glucagon (rDNA), dextrose, sodium chloride flush, magnesium hydroxide, ondansetron, dicyclomine, morphine      Labs/Imaging Results:   Lab Results    Component Value Date    WBC 16.9 (H) 05/31/2017    HGB 12.3 (L) 05/31/2017    HCT 40.7 (L) 05/31/2017    MCV 87.5 05/31/2017    PLT 357 05/31/2017     Lab Results   Component Value Date    NA 138 05/30/2017    K 4.6 05/30/2017    CL 99 05/30/2017    CO2 27 05/30/2017    BUN 17 05/30/2017    CREATININE 1.2 05/30/2017    GLUCOSE 192 (H) 05/30/2017    CALCIUM 9.2 05/30/2017    PROT 7.0 05/30/2017    LABALBU 4.5 05/30/2017    BILITOT 0.2 05/30/2017    ALKPHOS 60 05/30/2017    AST 15 05/30/2017    ALT 27 05/30/2017    LABGLOM >60 05/30/2017    GFRAA >60 05/30/2017  Assessment:     57 y/o M with chronic intermittent lower abdominal pain    Plan:     1. Abdominal pain  -Uncertain etiology. Abdominal exam, labs, and imaging not suggestive of acute surgical abdomen or mesenteric ischemia.   -Pt states the pain is mostly after voiding or having BM.  -Pt does have IBS and actively using marijuana. Possible concern about cannabinoid hyperemesis syndrome per GI.  -Ok for diet as tolerated from General Surgery standpoint  -Will continue to follow     2. Stenosis at origin of IMA  -Pt being evaluated by CT Surgery  -The celiac and SMA are widely patent  -Appreciate their recommendations     3. Leukocytosis  -Pt states it is "always elevated."  -? Hematology consult      Electronically signed by Lacretia Nicks, II, MD on 06/01/2017 at 10:13 AM

## 2017-06-01 NOTE — Progress Notes (Deleted)
Consult noted.   In CT at the time of our rounds.   Pt with subacute DVT.   Agree with heparin gtt and transition to PO AC.   Will try to see later.

## 2017-06-01 NOTE — Progress Notes (Signed)
Today's plan:  Out patient follow up,       Admit Date:  05/30/2017    Subjective:abdominal pain      Chief complaints on admission  Chief Complaint   Patient presents with   ??? Abdominal Pain     for the past 5-6 weeks, has appt. with GI dr on 5/22 but unable to wait   ??? Chest Pain     onset this morning         History of present illness:Ricardo Foley is a 57 y.o.year old who  presents with had concerns including Abdominal Pain (for the past 5-6 weeks, has appt. with GI dr on 5/22 but unable to wait) and Chest Pain (onset this morning).      Past medical history:    has a past medical history of CAD (coronary artery disease), Diabetes ([place]), Hyperlipidemia, Hypertension, Nerve pain, and Scoliosis.  Past surgical history:   has a past surgical history that includes pr sono guide needle biopsy.  Social History:   reports that he has been smoking cigarettes.  He has been smoking about 1.00 pack per day. He has never used smokeless tobacco. He reports that he has current or past drug history. Drug: Marijuana. Frequency: 3.00 times per week. He reports that he does not drink alcohol.  Family history:  family history is not on file.    No Known Allergies      Objective:   BP (!) 153/82    Pulse 66    Temp 98 ??F (36.7 ??C) (Oral)    Resp 13    Ht 5\' 7"  (1.702 m)    Wt 184 lb 4 oz (83.6 kg)    SpO2 92%    BMI 28.86 kg/m??       Intake/Output Summary (Last 24 hours) at 06/01/2017 1201  Last data filed at 06/01/2017 0235  Gross per 24 hour   Intake 440 ml   Output 2525 ml   Net -2085 ml       TELEMETRY: Sinus     Physical Exam:  Constitutional:  Well developed, Well nourished, No acute distress, Non-toxic appearance.   HENT:  Normocephalic, Atraumatic, Bilateral external ears normal, Oropharynx moist, No oral exudates, Nose normal. Neck- Normal range of motion, No tenderness, Supple, No stridor.   Eyes:  PERRL, EOMI, Conjunctiva normal, No discharge.   Respiratory:  Normal breath sounds, No respiratory distress, No wheezing, No chest  tenderness.,no use of accessory muscles, diaphragm movement is normal  Cardiovascular: (PMI) apex non displaced,no lifts no thrills, no s3,no s4, Normal heart rate, Normal rhythm, No murmurs, No rubs, No gallops. Carotid arteries pulse and amplitude are normal no bruit, no abdominal bruit noted ( normal abdominal aorta ausculation), femoral arteries pulse and amplitude are normal no bruit, pedal pulses are normal  GI:  Bowel sounds normal, Soft, No tenderness, No masses, No pulsatile masses.   GU: External genitalia appear normal, No masses or lesions. No discharge. No CVA tenderness.   Musculoskeletal:  Intact distal pulses, No edema, No tenderness, No cyanosis, No clubbing. Good range of motion in all major joints. No tenderness to palpation or major deformities noted. Back- No tenderness.   Integument:  Warm, Dry, No erythema, No rash.   Lymphatic:  No lymphadenopathy noted.   Neurologic:  Alert & oriented x 3, Normal motor function, Normal sensory function, No focal deficits noted.   Psychiatric:  Affect normal, Judgment normal, Mood normal.     Medications:   ??? nicotine  1 patch Transdermal Daily   ??? atorvastatin  20 mg Oral Daily   ??? dicyclomine  20 mg Oral 4x Daily AC & HS   ??? insulin lispro  0-12 Units Subcutaneous TID WC   ??? insulin lispro  0-6 Units Subcutaneous Nightly   ??? DULoxetine  30 mg Oral Daily   ??? pantoprazole sodium  40 mg Oral QAM AC   ??? sodium chloride flush  10 mL Intravenous 2 times per day   ??? aspirin  81 mg Oral Daily   ??? enoxaparin  40 mg Subcutaneous Daily   ??? metroNIDAZOLE  500 mg Intravenous Q8H   ??? ciprofloxacin  400 mg Intravenous Q12H   ??? sodium chloride flush  10 mL Intravenous BID     ??? dextrose       cyclobenzaprine, glucose, dextrose, glucagon (rDNA), dextrose, sodium chloride flush, magnesium hydroxide, ondansetron, dicyclomine, morphine    Lab Data:  CBC:   Recent Labs     05/30/17  1820 05/31/17  0309   WBC 21.0* 16.9*   HGB 12.4* 12.3*   HCT 39.4* 40.7*   MCV 85.7 87.5   PLT  416 357     BMP:   Recent Labs     05/30/17  1820   NA 138   K 4.6   CL 99   CO2 27   BUN 17   CREATININE 1.2     LIVER PROFILE:   Recent Labs     05/30/17  1820   AST 15   ALT 27   LIPASE 51   BILITOT 0.2   ALKPHOS 60     PT/INR: No results for input(s): PROTIME, INR in the last 72 hours.  APTT: No results for input(s): APTT in the last 72 hours.  BNP:  No results for input(s): BNP in the last 72 hours.  TROPONIN: @TROPONINI :3@      Assessment:  57 y.o.year old who is admitted for          Plan:  1. Chest pain:atypical, tenderness of the chest wall and has abdominal tenderness also, will get echo here and once discharged, will follow up in office to see if he needs stress test  2. Inferior mesenteric artery stenosis: as per GI/ sx  3. Elevated WBC: patient always has elevated WBC, R/o blood disorder  4. HTN: uncontrolled, start norvasc 5mg  daily, if needed add HCTZ  5. DM: CONTINUE insulin  6. Dyslipidemia; stable, continue statins      All labs, medications and tests reviewed, continue all other medications of all above medical condition listed as is.      Doug Sou, MD 06/01/2017 12:01 PM

## 2017-06-01 NOTE — Progress Notes (Addendum)
Reason for Visit: lower abd pain    HPI: HE still complained of lower abd pain.  Although he reported that the pain ws "10/10" when I was in the room, he did not appear any distress.  He was calm and was sleep before I entered the room.  I had to called his name to wake him up.  He passed gas and had not had BM. He denied N/V.         PMH:    Past Medical History:   Diagnosis Date   ??? CAD (coronary artery disease)    ??? Diabetes ([facility])    ??? Hyperlipidemia    ??? Hypertension    ??? Nerve pain    ??? Scoliosis        PSH:    Past Surgical History:   Procedure Laterality Date   ??? PR SONO GUIDE NEEDLE BIOPSY         Medications:    No current facility-administered medications on file prior to encounter.      Current Outpatient Medications on File Prior to Encounter   Medication Sig Dispense Refill   ??? dicyclomine (BENTYL) 10 MG capsule Take 2 capsules by mouth 4 times daily (before meals and nightly) 30 capsule 0   ??? ondansetron (ZOFRAN ODT) 4 MG disintegrating tablet Take 1 tablet by mouth every 6 hours 10 tablet 0   ??? metFORMIN (GLUCOPHAGE) 1000 MG tablet Take 1,000 mg by mouth 2 times daily (with meals)     ??? hydrOXYzine (VISTARIL) 50 MG capsule Take 50 mg by mouth 3 times daily as needed for Itching     ??? pantoprazole sodium (PROTONIX) 40 MG PACK packet Take 40 mg by mouth every morning (before breakfast)     ??? omeprazole (PRILOSEC) 20 MG delayed release capsule Take 20 mg by mouth daily     ??? atorvastatin (LIPITOR) 20 MG tablet Take 20 mg by mouth daily     ??? cyclobenzaprine (FLEXERIL) 10 MG tablet Take 1 tablet by mouth 3 times daily as needed for Muscle spasms 20 tablet 0   ??? DULoxetine (CYMBALTA) 30 MG extended release capsule Take 1 capsule by mouth daily 30 capsule 0   ??? gabapentin (NEURONTIN) 300 MG capsule Take 1 capsule by mouth 3 times daily for 10 days.. 30 capsule 0       Allergies: No Known Allergies    Social History:    Social History     Socioeconomic History   ??? Marital status: Single     Spouse name: Not  on file   ??? Number of children: Not on file   ??? Years of education: Not on file   ??? Highest education level: Not on file   Occupational History   ??? Not on file   Social Needs   ??? Financial resource strain: Not on file   ??? Food insecurity:     Worry: Not on file     Inability: Not on file   ??? Transportation needs:     Medical: Not on file     Non-medical: Not on file   Tobacco Use   ??? Smoking status: Current Every Day Smoker     Packs/day: 1.00     Types: Cigarettes   ??? Smokeless tobacco: Never Used   Substance and Sexual Activity   ??? Alcohol use: No   ??? Drug use: Yes     Frequency: 3.0 times per week     Types: Marijuana   ???  Sexual activity: Not on file   Lifestyle   ??? Physical activity:     Days per week: Not on file     Minutes per session: Not on file   ??? Stress: Not on file   Relationships   ??? Social connections:     Talks on phone: Not on file     Gets together: Not on file     Attends religious service: Not on file     Active member of club or organization: Not on file     Attends meetings of clubs or organizations: Not on file     Relationship status: Not on file   ??? Intimate partner violence:     Fear of current or ex partner: Not on file     Emotionally abused: Not on file     Physically abused: Not on file     Forced sexual activity: Not on file   Other Topics Concern   ??? Not on file   Social History Narrative   ??? Not on file       Family History:  History reviewed. No pertinent family history.      Review of Systems:  Constitutional: No weight loss, no fevers.  Eyes: No problems with vision.  ENT: No nose or sinus problems, no oral problems, no throat problems or hoarseness.  Cardiovascular: No chest pain, no leg pain with walking, no palpitations, no ankle swelling.  Respiratory: No shortness of breath, no persistent cough, no wheezing.  Endocrine: No increased thirst, no increased urination.  Gastrointestinal: No heartburn, no dysphagia, + abdominal pain, no loss of appetite, no nausea or vomiting, no  diarrhea, no constipation, no melena, no hematochezia, no sceleral icterus or jaundice.  Skin: No rashes.  Musculoskeletal: No trouble walking or standing, no joint pain, no muscle pain.  Allergy/Immune System: No allergies, no frequent infections.  Neurological: No memory difficulties, no temporary blindness, no difficulty speaking, no headaches, no numbness.  Psychiatric: No depression, no suicidal ideation, no auditory hallucinations.  Hematological/Lymphatic: No lymphadenopathy, no frequent nose bleeds, no easy bruising.  Genitourinary: No penile/vaginal discharge, no pain with urination, no trouble starting urinary stream, no hematuria.    Physical Examination  Vital Signs: BP (!) 153/82    Pulse 66    Temp 98 ??F (36.7 ??C) (Oral)    Resp 13    Ht 5' 7"  (1.702 m)    Wt 184 lb 4 oz (83.6 kg)    SpO2 92%    BMI 28.86 kg/m??  Body mass index is 28.86 kg/m??.    General: The patient is a 57 y.o. male in No acute distress.  EYE: EOMI, Gross visual field was normal. Pupils reactive, The conjunctive was normal, with no erythema.  ENT: no lymphadenopathy, oropharynx is without erythema, edema, or exudates, and moist mucus membranes. The nasal mucosa, septum and turbinates were normal without inflammation or edema.  Neck: There was no mass on palpitation, tracheal position was in the middle of the neck and there was no enlarged thyroid.  There was no JVD.  Lungs: The respiratory was not in labor and the patient did not use accessory muscle. Clear to auscultation bilaterally, no wheeze/crackles.  Cardiovascular: Regular rate and rhythm, normal S1 & S2, no murmurs, rubs or gallops appreciated. Peripheral pulses were normal and no tenderness.  Abdomen: Soft, mild tender on the lower abd, no rebound or guarding or peritoneal features, no masses, no hepatosplenomegaly.  Extremities: upper and lower extremities were warm  and dry, no clubbing, cyanosis, edema.  Neuro: CN II-XII were intact grossly. Sensation was normal on all  extremities and the muscle strength was normal and symmetry.      Labs:  CBC  WBC   Date Value Ref Range Status   05/31/2017 16.9 (H) 4.0 - 10.5 K/CU MM Final     Hemoglobin   Date Value Ref Range Status   05/31/2017 12.3 (L) 13.5 - 18.0 GM/DL Final     Hematocrit   Date Value Ref Range Status   05/31/2017 40.7 (L) 42 - 52 % Final     MCV   Date Value Ref Range Status   05/31/2017 87.5 78 - 100 FL Final        Glucose   Date Value Ref Range Status   05/30/2017 192 (H) 70 - 99 MG/DL Final     CO2   Date Value Ref Range Status   05/30/2017 27 21 - 32 MMOL/L Final     BUN   Date Value Ref Range Status   05/30/2017 17 6 - 23 MG/DL Final     Lab Results   Component Value Date    ALT 27 05/30/2017    AST 15 05/30/2017     No results found for: AMYLASE  Lab Results   Component Value Date    LIPASE 51 05/30/2017     No results found for: ESR  No components found for: CREACTIVEPR  No results found for: ANA No components found for: ANTISMA, ANTIMITO  No results found for: CEA  No components found for: OCCULTBLOOD, OCCBLDSINPOC, OCCULTBLOODS, OCCBLD1, OCCBLD2, OCCBLD3, OCCULTBLOOD  No results found for: IRON, FERRITIN  No results found for: HAV    Assessment     Assessment and Plan:    1. Abd pain, nauseat might be due to cannabinoid hyperemesis syndrome or IBS.  All CT and CTA did not show acute abdomen or other pathologies.  The surgeons (general surgeon and the vascular surgeon) did not think that there was mesenteric ischemia or other acute abdomen.  I will suggest continuing symptomatic treating him.      At this time, there was no need to do interventional GI procedure such as colonoscopy.  I had reviewed the prior colonoscopy report (I year ago), which was truly unremarkable.  Although, the colonoscopy showed ? Protitis, the biopsies were unremarkable at that time.  He had regular BM at hom and had no Gi bleeding and diarrhea. In addition, based on the admission note and surgeon's note, there was a concern of  "subclinical diverticulitis", for which he has been on Abx.  If the diverticulitis is a concern, colonoscopy is contraindicated.   Therefore, I did not think that he needs a colonoscopy at this time.    2. Elevation of WBC count, chronic, I suggested hematologist consultation.          Barbette Merino MD PhD [facility]  [address] [place] [address]  54fice: [phone] Fax: [phone]

## 2017-06-01 NOTE — Progress Notes (Signed)
Hospitalist Progress Note         Admit Date: 05/30/2017    PCP: Hazel Sams, APRN - CNP     Chief Complaint   Patient presents with   ??? Abdominal Pain     for the past 5-6 weeks, has appt. with GI dr on 5/22 but unable to wait   ??? Chest Pain     onset this morning        Assessment and Plan:     ??? Chest pain: Cardiology evaluated and pending echo.  No ACS.  Outpatient stress test.    Continue current aspirin, statin and maintain good BP control.  ??? Diffuse nonspecific abdominal pain/leukocytosis: I don't see any evidence of diverticulitis on CT.    However given his clinical symptoms I would continue with current antibiotics Cipro/Flagyl.    I think the leukocytosis could be stress related.  Continue monitor clinically, vitals and CBC  ??? CTA with IMA high grade stenosis but widely patent SMA and celiac arteries. Pt also has renal artery stenosis.     This is unlikely the cause of abdominal pain as he has good collateral flow.Evaluated by vascular surgery  ??? Nausea vomiting and abdominal pain more than likely from cannabis long term use.    Continue nausea and pain control.  However patient is not happy with current assessment.  ??? Diabetes mellitus: A1c 7.2.  Continue sliding scale.  Follow Accu-Cheks  ??? Tobacco abuse: Smoking cessation counseling provided.  Nicoderm patch    VTE prophylaxis LMWH  DC home upon pain control.    Current Facility-Administered Medications   Medication Dose Route Frequency Provider Last Rate Last Dose   ??? nicotine (NICODERM CQ) 21 MG/24HR 1 patch  1 patch Transdermal Daily Huston Foley, MD       ??? atorvastatin (LIPITOR) tablet 20 mg  20 mg Oral Daily Lester Kinsman, MD   20 mg at 06/01/17 0845   ??? cyclobenzaprine (FLEXERIL) tablet 10 mg  10 mg Oral TID PRN Lester Kinsman, MD       ??? dicyclomine (BENTYL) capsule 20 mg  20 mg Oral 4x Daily AC & HS Lester Kinsman, MD   20 mg at 06/01/17 0551   ??? glucose (GLUTOSE) 40 % oral gel 15 g  15 g Oral PRN Lester Kinsman, MD       ??? dextrose 50 % solution  12.5 g  12.5 g Intravenous PRN Lester Kinsman, MD       ??? glucagon (rDNA) injection 1 mg  1 mg Intramuscular PRN Lester Kinsman, MD       ??? dextrose 5 % solution  100 mL/hr Intravenous PRN Lester Kinsman, MD       ??? insulin lispro (HUMALOG) injection vial 0-12 Units  0-12 Units Subcutaneous TID WC Lester Kinsman, MD   4 Units at 06/01/17 0734   ??? insulin lispro (HUMALOG) injection vial 0-6 Units  0-6 Units Subcutaneous Nightly Lester Kinsman, MD   1 Units at 05/31/17 2011   ??? DULoxetine (CYMBALTA) extended release capsule 30 mg  30 mg Oral Daily Lester Kinsman, MD   30 mg at 06/01/17 0845   ??? pantoprazole sodium (PROTONIX) packet 40 mg  40 mg Oral QAM AC Atul Kutwal, MD   40 mg at 06/01/17 0551   ??? sodium chloride flush 0.9 % injection 10 mL  10 mL Intravenous 2 times per day Lester Kinsman, MD   10 mL at 06/01/17 0845   ??? sodium  chloride flush 0.9 % injection 10 mL  10 mL Intravenous PRN Lester Kinsman, MD       ??? magnesium hydroxide (MILK OF MAGNESIA) 400 MG/5ML suspension 30 mL  30 mL Oral Daily PRN Lester Kinsman, MD       ??? ondansetron (ZOFRAN) injection 4 mg  4 mg Intravenous Q6H PRN Lester Kinsman, MD   4 mg at 06/01/17 0234   ??? aspirin chewable tablet 81 mg  81 mg Oral Daily Lester Kinsman, MD   81 mg at 06/01/17 0845   ??? enoxaparin (LOVENOX) injection 40 mg  40 mg Subcutaneous Daily Lester Kinsman, MD   40 mg at 06/01/17 0845   ??? metronidazole (FLAGYL) 500 mg in NaCl 100 mL IVPB premix  500 mg Intravenous Q8H Lester Kinsman, MD   Stopped at 06/01/17 0335   ??? dicyclomine (BENTYL) capsule 10 mg  10 mg Oral TID PRN Barbette Merino, MD       ??? ciprofloxacin (CIPRO) IVPB 400 mg  400 mg Intravenous Q12H Huston Foley, MD   Stopped at 06/01/17 0231   ??? sodium chloride flush 0.9 % injection 10 mL  10 mL Intravenous BID Meliton Rattan, MD   10 mL at 05/30/17 2127   ??? morphine sulfate (PF) injection 4 mg  4 mg Intravenous Q3H PRN Lester Kinsman, MD   4 mg at 06/01/17 0934       Subjective:     Patient still complains of 10/10 pain.  No acute Overnight  events      Objective:       Intake/Output Summary (Last 24 hours) at 06/01/2017 1147  Last data filed at 06/01/2017 0235  Gross per 24 hour   Intake 440 ml   Output 2525 ml   Net -2085 ml      Vitals:   Vitals:    06/01/17 0843   BP:    Pulse: 66   Resp:    Temp:    SpO2:      Physical Exam:  General Appearance:    No acute distress  Head:      Normocephalic, without obvious abnormality, atraumatic  Eyes:       Conjunctiva/corneas clear, EOM's intact  Lungs:    Clear to auscultation bilaterally, respirations unlabored  Heart:                 Regular rate and rhythm, S1 and S2 normal, no murmur,   rub or gallop  Abdomen:     Soft, diffuse nonspecific tenderness+, bowel sounds active, no masses, no organomegaly  Extremities:   Extremities normal, atraumatic, no cyanosis or edema   Neurological:   Grossly Intact.    Significant Diagnostic Studies:   DATA:    CBC   Recent Labs     05/30/17  1820 05/31/17  0309   WBC 21.0* 16.9*   HGB 12.4* 12.3*   HCT 39.4* 40.7*   PLT 416 357      BMP   Recent Labs     05/30/17  1820   NA 138   K 4.6   CL 99   CO2 27   BUN 17   CREATININE 1.2     LFT'S   Recent Labs     05/30/17  1820   AST 15   ALT 27   BILITOT 0.2   ALKPHOS 60     COAG No results for input(s): INR in the last 72 hours.  POC:   Lab  Results   Component Value Date    POCGLU 212 06/01/2017    POCGLU 192 05/31/2017    POCGLU 136 05/31/2017    POCGLU 143 05/31/2017     HemoglobinA1C:  Lab Results   Component Value Date    LABA1C 7.2 05/31/2017     CARDIAC ENZYMES  No results for input(s): CKTOTAL, CKMB, CKMBINDEX, TROPONINI in the last 72 hours.  Troponin:   Recent Labs     05/30/17  1820 05/31/17  0309 05/31/17  1001   TROPONINT <0.010 <0.010 <0.010     BNP: No results for input(s): PROBNP in the last 72 hours.  U/A:    Lab Results   Component Value Date    COLORU STRAW 05/31/2017    WBCUA <1 05/31/2017    RBCUA 1 05/31/2017    MUCUS OCCASIONAL 05/11/2017    BACTERIA NEGATIVE 05/31/2017    CLARITYU CLEAR 05/31/2017     SPECGRAV 1.012 05/31/2017    LEUKOCYTESUR NEGATIVE 05/31/2017    BLOODU SMALL 05/31/2017       Ct Abdomen Pelvis W Iv Contrast Additional Contrast? None    Result Date: 05/30/2017  EXAMINATION: CT OF THE ABDOMEN AND PELVIS WITH CONTRAST 05/30/2017 9:31 pm TECHNIQUE: CT of the abdomen and pelvis was performed with the administration of intravenous contrast. Multiplanar reformatted images are provided for review. Dose modulation, iterative reconstruction, and/or weight based adjustment of the mA/kV was utilized to reduce the radiation dose to as low as reasonably achievable. COMPARISON: 05/11/2017 HISTORY: ORDERING SYSTEM PROVIDED HISTORY: ABDOMINAL PAIN TECHNOLOGIST PROVIDED HISTORY: Additional Contrast?->None Ordering Physician Provided Reason for Exam: abd pain Acuity: Unknown Type of Exam: Unknown Additional signs and symptoms: Abdominal Pain (for the past 5-6 weeks, has appt. with GI dr on 5/22 but unable to wait Relevant Medical/Surgical History: isovue 370 80 ml FINDINGS: Lower Chest:  Visualized portion of the lower chest demonstrates no acute abnormality. Organs: The liver, gallbladder, biliary system, pancreas, adrenal glands, spleen, and kidneys are normal.  There is no hydronephrosis or evidence of urinary tract calculus. GI/Bowel: No acute bowel abnormality.  Minimal sigmoid diverticulosis.  Small hiatal hernia.  Normal appendix. Pelvis: Urinary bladder and pelvic organs unremarkable. Peritoneum/Retroperitoneum: No free air or free fluid.  No abnormal lymph node.  Mild atherosclerotic disease.  No abdominal aortic aneurysm. Bones/Soft Tissues: No acute osseous or soft tissue abnormality.     1. No acute abdominopelvic abnormality. 2. Small hiatal hernia. 3. Minimal sigmoid diverticulosis.     Ct Abdomen Pelvis W Iv Contrast    Result Date: 05/11/2017  EXAMINATION: CT OF THE ABDOMEN AND PELVIS WITH CONTRAST 05/11/2017 3:59 pm TECHNIQUE: CT of the abdomen and pelvis was performed with the administration of  intravenous contrast. Multiplanar reformatted images are provided for review. Dose modulation, iterative reconstruction, and/or weight based adjustment of the mA/kV was utilized to reduce the radiation dose to as low as reasonably achievable. COMPARISON: None. HISTORY: ORDERING SYSTEM PROVIDED HISTORY: left lower abdominal pain TECHNOLOGIST PROVIDED HISTORY: Ordering Physician Provided Reason for Exam: lower abd pain Acuity: Acute Type of Exam: Initial Additional signs and symptoms: onset 5 days ago Relevant Medical/Surgical History: 75 ml isovue 370 FINDINGS: Lower Chest: The lung bases are clear and the heart size is normal. Organs: The liver, gallbladder, pancreas, and spleen are within normal limits.  No adrenal masses are identified and there is no hydronephrosis. GI/Bowel: There is no bowel obstruction, free-air, or free-fluid.  Mild to moderate stool is present within the colon.  The stomach  and duodenum are within normal limits.  There is diverticulosis coli. Pelvis: No acute abnormality. Peritoneum/Retroperitoneum: No pathologically enlarged lymph nodes are identified.  There are mild vascular calcifications. Bones/Soft Tissues: No acute osseous abnormality is visualized     No acute abdominopelvic abnormality detected. Diverticulosis coli without evidence of diverticulitis or colitis.     Xr Chest Portable    Result Date: 05/30/2017  EXAMINATION: ONE XRAY VIEW OF THE CHEST 05/30/2017 5:41 pm COMPARISON: None. HISTORY: ORDERING SYSTEM PROVIDED HISTORY: Chest pain TECHNOLOGIST PROVIDED HISTORY: Reason for exam:->Chest pain Ordering Physician Provided Reason for Exam: chest pain, abdomen pain Acuity: Acute Type of Exam: Initial Relevant Medical/Surgical History: htn, cad FINDINGS: The lungs are clear and well expanded.  Costophrenic angles are clear. Cardiac and mediastinal structures are unremarkable.  Osseous structures are intact.     No evidence of acute cardiopulmonary disease.           Stuart  Rounding Hospitalist

## 2017-06-02 LAB — POCT GLUCOSE
POC Glucose: 147 MG/DL — ABNORMAL HIGH (ref 70–99)
POC Glucose: 121 MG/DL — ABNORMAL HIGH (ref 70–99)

## 2017-06-02 LAB — CBC
Hematocrit: 43.8 % (ref 42–52)
Hemoglobin: 13.6 GM/DL (ref 13.5–18.0)
MCH: 26.5 PG — ABNORMAL LOW (ref 27–31)
MCHC: 31.1 % — ABNORMAL LOW (ref 32.0–36.0)
MCV: 85.4 FL (ref 78–100)
MPV: 9.8 FL (ref 7.5–11.1)
Platelets: 410 10*3/uL (ref 140–440)
RBC: 5.13 10*6/uL (ref 4.6–6.2)
RDW: 15.7 % — ABNORMAL HIGH (ref 11.7–14.9)
WBC: 17.9 10*3/uL — ABNORMAL HIGH (ref 4.0–10.5)

## 2017-06-02 LAB — TISSUE TRANSGLUTAMINASE ANTIBODY IGA W/ REFLEX: Transglutaminase IgA: 0 U/mL (ref 0–3)

## 2017-06-02 MED ORDER — ONDANSETRON 4 MG PO TBDP
4 MG | ORAL_TABLET | Freq: Three times a day (TID) | ORAL | 0 refills | Status: DC | PRN
Start: 2017-06-02 — End: 2018-03-18

## 2017-06-02 MED ORDER — ASPIRIN 81 MG PO CHEW
81 MG | ORAL_TABLET | Freq: Every day | ORAL | 3 refills | Status: DC
Start: 2017-06-02 — End: 2018-03-05

## 2017-06-02 MED ORDER — DICYCLOMINE HCL 10 MG PO CAPS
10 MG | ORAL_CAPSULE | Freq: Four times a day (QID) | ORAL | 0 refills | Status: DC
Start: 2017-06-02 — End: 2017-09-08

## 2017-06-02 MED ORDER — NICOTINE 21 MG/24HR TD PT24
21 MG/24HR | MEDICATED_PATCH | Freq: Every day | TRANSDERMAL | 3 refills | Status: DC
Start: 2017-06-02 — End: 2017-07-19

## 2017-06-02 MED ORDER — PANTOPRAZOLE SODIUM 40 MG PO PACK
40 MG | Freq: Every day | ORAL | 3 refills | Status: DC
Start: 2017-06-02 — End: 2017-09-08

## 2017-06-02 MED FILL — MORPHINE SULFATE 4 MG/ML IJ SOLN: 4 mg/mL | INTRAMUSCULAR | Qty: 1

## 2017-06-02 MED FILL — LIPITOR 20 MG PO TABS: 20 mg | ORAL | Qty: 1

## 2017-06-02 MED FILL — LOVENOX 40 MG/0.4ML SC SOLN: 40 MG/0.4ML | SUBCUTANEOUS | Qty: 0.4

## 2017-06-02 MED FILL — HUMALOG 100 UNIT/ML SC SOLN: 100 [IU]/mL | SUBCUTANEOUS | Qty: 300

## 2017-06-02 MED FILL — ASPIRIN 81 MG PO CHEW: 81 mg | ORAL | Qty: 1

## 2017-06-02 MED FILL — METRONIDAZOLE IN NACL 5-0.79 MG/ML-% IV SOLN: 5 mg/mL | INTRAVENOUS | Qty: 100

## 2017-06-02 MED FILL — DICYCLOMINE HCL 10 MG PO CAPS: 10 mg | ORAL | Qty: 2

## 2017-06-02 MED FILL — PROTONIX 40 MG PO PACK: 40 mg | ORAL | Qty: 1

## 2017-06-02 MED FILL — DULOXETINE HCL 30 MG PO CPEP: 30 mg | ORAL | Qty: 1

## 2017-06-02 MED FILL — NICOTINE 21 MG/24HR TD PT24: 21 mg/(24.h) | TRANSDERMAL | Qty: 1

## 2017-06-02 MED FILL — CIPROFLOXACIN IN D5W 400 MG/200ML IV SOLN: 400 MG/200ML | INTRAVENOUS | Qty: 200

## 2017-06-02 NOTE — Consults (Incomplete)
Department of Cardiovascular & Thoracic Surgery   Consult Note    Reason for Consult:  PAD    Admitting Physician: Dr.     Date of Consult: 07/09/17      History Obtained From:  patient     HISTORY OF PRESENT ILLNESS:    The patient is a 57 y.o. male who presents with ***.  Consult requested for evaluation of PAD.      Past Medical History:        Diagnosis Date   . CAD (coronary artery disease)    . Diabetes ([facility])    . H/O cardiovascular stress test 07/03/2017    scan shows moderate size, severe intensity, non reversible perfusion defect in inferolateral wall, normal LVEF.   . H/O echocardiogram 05/30/2017    Left ventricular systolic function is normal. Ejection fraction is visually estimated at >55%.   Marland Kitchen Hyperlipidemia    . Hypertension    . Nerve pain    . Scoliosis      Past Surgical History:        Procedure Laterality Date   . PR SONO GUIDE NEEDLE BIOPSY       Current Medications:   No current facility-administered medications for this encounter.   Allergies:  No Known Allergies    Social History:   Social History     Socioeconomic History   . Marital status: Single     Spouse name: Not on file   . Number of children: Not on file   . Years of education: Not on file   . Highest education level: Not on file   Occupational History   . Not on file   Social Needs   . Financial resource strain: Not on file   . Food insecurity:     Worry: Not on file     Inability: Not on file   . Transportation needs:     Medical: Not on file     Non-medical: Not on file   Tobacco Use   . Smoking status: Current Every Day Smoker     Packs/day: 1.00     Types: Cigarettes   . Smokeless tobacco: Never Used   Substance and Sexual Activity   . Alcohol use: No   . Drug use: Yes     Frequency: 3.0 times per week     Types: Marijuana   . Sexual activity: Not on file   Lifestyle   . Physical activity:     Days per week: Not on file     Minutes per session: Not on file   . Stress: Not on file   Relationships   . Social connections:     Talks  on phone: Not on file     Gets together: Not on file     Attends religious service: Not on file     Active member of club or organization: Not on file     Attends meetings of clubs or organizations: Not on file     Relationship status: Not on file   . Intimate partner violence:     Fear of current or ex partner: Not on file     Emotionally abused: Not on file     Physically abused: Not on file     Forced sexual activity: Not on file   Other Topics Concern   . Not on file   Social History Narrative   . Not on file       Family History:  Problem Relation Age of Onset   . Stroke Sister    . Heart Attack Brother        REVIEW OF SYSTEMS:  ROS: Is as noted above in HPI. Complete system review is done and is negative except as noted above.     EXAM:  Blood pressure (!) 146/107, pulse 72, temperature 98.3 F (36.8 C), temperature source Oral, resp. rate 22, height 5\' 7"  (1.702 m), weight 184 lb 4 oz (83.6 kg), SpO2 100 %.  General appearance: No apparent distress, appears stated age and cooperative.  Skin: unremarkable, ***  HEENT Normocephalic, atraumatic without obvious deformity.   Neck: Supple, Trachea midline   Lungs: Good respiratory effort. Clear to auscultation,  Heart: Regular rate/ rhythm   Abdomen: Soft, non-tender or non-distended   Extremities: *** edema warm *** well perfused  Vascular:  dopplerable ***, ***cap refill  Neurologic: Alert, grossly intact  Mental status: normal affect    DATA:  ***Art Duplex  ***CTA    IMPRESSION  Patient Active Problem List   Diagnosis   . Chest pain   . Lower abdominal pain   . Anemia       ***PAD    RECOMMENDATIONS:    ***    Eugene Gavia, MD

## 2017-06-02 NOTE — Progress Notes (Addendum)
Reason for Visit: lower abd pain    HPI: He felt improved and he only had 4-5/10 of lower abd intermittent pain.  He passed gas and tolerated with foods (lregular).  He did not vomiting.        PMH:    Past Medical History:   Diagnosis Date   ??? CAD (coronary artery disease)    ??? Diabetes ([place])    ??? Hyperlipidemia    ??? Hypertension    ??? Nerve pain    ??? Scoliosis        PSH:    Past Surgical History:   Procedure Laterality Date   ??? PR SONO GUIDE NEEDLE BIOPSY         Medications:    No current facility-administered medications on file prior to encounter.      Current Outpatient Medications on File Prior to Encounter   Medication Sig Dispense Refill   ??? metFORMIN (GLUCOPHAGE) 1000 MG tablet Take 1,000 mg by mouth 2 times daily (with meals)     ??? hydrOXYzine (VISTARIL) 50 MG capsule Take 50 mg by mouth 3 times daily as needed for Itching     ??? atorvastatin (LIPITOR) 20 MG tablet Take 20 mg by mouth daily     ??? cyclobenzaprine (FLEXERIL) 10 MG tablet Take 1 tablet by mouth 3 times daily as needed for Muscle spasms 20 tablet 0   ??? DULoxetine (CYMBALTA) 30 MG extended release capsule Take 1 capsule by mouth daily 30 capsule 0   ??? gabapentin (NEURONTIN) 300 MG capsule Take 1 capsule by mouth 3 times daily for 10 days.. 30 capsule 0       Allergies: No Known Allergies    Social History:    Social History     Socioeconomic History   ??? Marital status: Single     Spouse name: Not on file   ??? Number of children: Not on file   ??? Years of education: Not on file   ??? Highest education level: Not on file   Occupational History   ??? Not on file   Social Needs   ??? Financial resource strain: Not on file   ??? Food insecurity:     Worry: Not on file     Inability: Not on file   ??? Transportation needs:     Medical: Not on file     Non-medical: Not on file   Tobacco Use   ??? Smoking status: Current Every Day Smoker     Packs/day: 1.00     Types: Cigarettes   ??? Smokeless tobacco: Never Used   Substance and Sexual Activity   ??? Alcohol use: No    ??? Drug use: Yes     Frequency: 3.0 times per week     Types: Marijuana   ??? Sexual activity: Not on file   Lifestyle   ??? Physical activity:     Days per week: Not on file     Minutes per session: Not on file   ??? Stress: Not on file   Relationships   ??? Social connections:     Talks on phone: Not on file     Gets together: Not on file     Attends religious service: Not on file     Active member of club or organization: Not on file     Attends meetings of clubs or organizations: Not on file     Relationship status: Not on file   ??? Intimate partner violence:  Fear of current or ex partner: Not on file     Emotionally abused: Not on file     Physically abused: Not on file     Forced sexual activity: Not on file   Other Topics Concern   ??? Not on file   Social History Narrative   ??? Not on file       Family History:  History reviewed. No pertinent family history.      Review of Systems:  Constitutional: No weight loss, no fevers.  Eyes: No problems with vision.  ENT: No nose or sinus problems, no oral problems, no throat problems or hoarseness.  Cardiovascular: No chest pain, no leg pain with walking, no palpitations, no ankle swelling.  Respiratory: No shortness of breath, no persistent cough, no wheezing.  Endocrine: No increased thirst, no increased urination.  Gastrointestinal: No heartburn, no dysphagia, no abdominal pain, no loss of appetite, no nausea or vomiting, no diarrhea, no constipation, no melena, no hematochezia, no sceleral icterus or jaundice.  Skin: No rashes.  Musculoskeletal: No trouble walking or standing, no joint pain, no muscle pain.  Allergy/Immune System: No allergies, no frequent infections.  Neurological: No memory difficulties, no temporary blindness, no difficulty speaking, no headaches, no numbness.  Psychiatric: No depression, no suicidal ideation, no auditory hallucinations.  Hematological/Lymphatic: No lymphadenopathy, no frequent nose bleeds, no easy bruising.  Genitourinary: No  penile/vaginal discharge, no pain with urination, no trouble starting urinary stream, no hematuria.    Physical Examination  Vital Signs: BP (!) 146/107    Pulse 72    Temp 98.3 ??F (36.8 ??C) (Oral)    Resp 22    Ht 5' 7"  (1.702 m)    Wt 184 lb 4 oz (83.6 kg)    SpO2 100%    BMI 28.86 kg/m??  Body mass index is 28.86 kg/m??.    General: The patient is a 57 y.o. male in No acute distress.  EYE: EOMI, Gross visual field was normal. Pupils reactive, The conjunctive was normal, with no erythema.  ENT: no lymphadenopathy, oropharynx is without erythema, edema, or exudates, and moist mucus membranes. The nasal mucosa, septum and turbinates were normal without inflammation or edema.  Neck: There was no mass on palpitation, tracheal position was in the middle of the neck and there was no enlarged thyroid.  There was no JVD.  Lungs: The respiratory was not in labor and the patient did not use accessory muscle. Clear to auscultation bilaterally, no wheeze/crackles.  Cardiovascular: Regular rate and rhythm, normal S1 & S2, no murmurs, rubs or gallops appreciated. Peripheral pulses were normal and no tenderness.  Abdomen: Soft, non-tender, no rebound or guarding or peritoneal features, no masses, no hepatosplenomegaly.  Extremities: upper and lower extremities were warm and dry, no clubbing, cyanosis, edema.  Neuro: CN II-XII were intact grossly. Sensation was normal on all extremities and the muscle strength was normal and symmetry.  Rectum: There was no fistular, fissure, external hemorrhoid, tenderness, abscess, erythema or discharge    Labs:  CBC  WBC   Date Value Ref Range Status   06/02/2017 17.9 (H) 4.0 - 10.5 K/CU MM Final     Hemoglobin   Date Value Ref Range Status   06/02/2017 13.6 13.5 - 18.0 GM/DL Final     Hematocrit   Date Value Ref Range Status   06/02/2017 43.8 42 - 52 % Final     MCV   Date Value Ref Range Status   06/02/2017 85.4 78 -  100 FL Final        Glucose   Date Value Ref Range Status   05/30/2017 192 (H)  70 - 99 MG/DL Final     CO2   Date Value Ref Range Status   05/30/2017 27 21 - 32 MMOL/L Final     BUN   Date Value Ref Range Status   05/30/2017 17 6 - 23 MG/DL Final     Lab Results   Component Value Date    ALT 27 05/30/2017    AST 15 05/30/2017     No results found for: AMYLASE  Lab Results   Component Value Date    LIPASE 51 05/30/2017     Lab Results   Component Value Date    ESR 5 06/01/2017     No components found for: CREACTIVEPR  No results found for: ANA No components found for: ANTISMA, ANTIMITO  No results found for: CEA  No components found for: OCCULTBLOOD, OCCBLDSINPOC, OCCULTBLOODS, OCCBLD1, OCCBLD2, OCCBLD3, OCCULTBLOOD  No results found for: IRON, FERRITIN  No results found for: HAV    Assessment     Assessment and Plan:    1. Abd pain, nauseat might be due to cannabinoid hyperemesis syndrome or IBS. They have improve on conservative therapy.  He will continue medical treatment and follow with his outpatient GI (Dr. Franchot Mimes and Dr. Eulah Citizen group).  He had already  Had an appointment.     2. Elevation of WBC count was unlike due to GI etiology.  I suggested consulting or following with a hematologist.      Barbette Merino MD PhD [facility]  [address] [place] [address]  55fice: [phone] Fax: [phone]

## 2017-06-02 NOTE — Progress Notes (Signed)
General Surgery-Dr. Marga Melnick Day: 4    Chief Complaint on Admission: lower abdominal pain      Subjective:     Pt states the pain is improving. Today states it is 4/10. Maybe 5 at times.   Tolerating some PO. No N/V.   No F/C.  +flatus. No BM.   Asking about going home, asking about referral to pain mgt.     ROS:  Review of Systems Negative except as above.     Allergies  Patient has no known allergies.          Diagnosis Date   . CAD (coronary artery disease)    . Diabetes ([facility])    . Hyperlipidemia    . Hypertension    . Nerve pain    . Scoliosis        Objective:     Vitals:    06/02/17 0855   BP: (!) 146/107   Pulse: 72   Resp: 22   Temp: 98.3 F (36.8 C)   SpO2: 100%       TEMPERATURE:  Current -Temp: 98.3 F (36.8 C); Max - Temp  Avg: 98.4 F (36.9 C)  Min: 98.3 F (36.8 C)  Max: 98.6 F (37 C)    No intake/output data recorded.I/O last 3 completed shifts:  In: 460 [P.O.:360; IV Piggyback:100]  Out: 1050 [Urine:1050]      Physical Exam:  Physical Exam  A&Ox3, NAD at rest. Sitting at bedside. Comfortable.   AT. NC.  Breathing unlabored.  RRR.  S, ND, minimally tender lower but distractible, no PS  MAE. Intact motor and sensation b/l UE and LE  Warm, dry, non-jaundiced    Scheduled Meds:  . nicotine  1 patch Transdermal Daily   . atorvastatin  20 mg Oral Daily   . dicyclomine  20 mg Oral 4x Daily AC & HS   . insulin lispro  0-12 Units Subcutaneous TID WC   . insulin lispro  0-6 Units Subcutaneous Nightly   . DULoxetine  30 mg Oral Daily   . pantoprazole sodium  40 mg Oral QAM AC   . sodium chloride flush  10 mL Intravenous 2 times per day   . aspirin  81 mg Oral Daily   . enoxaparin  40 mg Subcutaneous Daily   . metroNIDAZOLE  500 mg Intravenous Q8H   . ciprofloxacin  400 mg Intravenous Q12H   . sodium chloride flush  10 mL Intravenous BID     ContinuousInfusions:  . dextrose       PRN Meds:cyclobenzaprine, glucose, dextrose, glucagon (rDNA), dextrose, sodium chloride flush, magnesium hydroxide,  ondansetron, dicyclomine, morphine      Labs/Imaging Results:   Lab Results   Component Value Date    WBC 17.9 (H) 06/02/2017    HGB 13.6 06/02/2017    HCT 43.8 06/02/2017    MCV 85.4 06/02/2017    PLT 410 06/02/2017     Lab Results   Component Value Date    NA 138 05/30/2017    K 4.6 05/30/2017    CL 99 05/30/2017    CO2 27 05/30/2017    BUN 17 05/30/2017    CREATININE 1.2 05/30/2017    GLUCOSE 192 (H) 05/30/2017    CALCIUM 9.2 05/30/2017    PROT 7.0 05/30/2017    LABALBU 4.5 05/30/2017    BILITOT 0.2 05/30/2017    ALKPHOS 60 05/30/2017    AST 15 05/30/2017    ALT 27 05/30/2017  LABGLOM >60 05/30/2017    GFRAA >60 05/30/2017       Assessment:     57 y/o M with chronic intermittent lower abdominal pain    Plan:     1. Abdominal pain  -Uncertain etiology. Abdominal exam, labs, and imaging not suggestive of acute surgical abdomen or mesenteric ischemia. Improving.   -Pt states the pain is mostly after voiding or having BM.  -Pt does have IBS and actively using marijuana. Possible concern about cannabinoid hyperemesis syndrome per GI.  -Ok for diet as tolerated from General Surgery standpoint  -No acute surgical intervention planned. OK for D/C from General Surgery standpoint and f/u in office in 2-4 weeks. Will sign off. Call with any questions or concerns.     2. 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 No plans for intervention warranted.   -The celiac and SMA are widely patent  -Appreciate their recommendations     3. Leukocytosis  -Pt states it is "always elevated."  -? Hematology consult    4. Chronic pain  -Pt may benefit from referral to pain management specialist.       Electronically signed by Ulla Gallo, II, MD on 06/02/2017 at 10:45 AM

## 2017-06-02 NOTE — Consults (Signed)
Department of Cardiovascular & Thoracic Surgery   Consult Note    Reason for Consult:  PAD    Admitting Physician: Dr. Joni Fears    Date of Consult: 05/31/17      History Obtained From:  patient     HISTORY OF PRESENT ILLNESS:    The patient is a 57 y.o. male who presented with chronic abdominal pain.  Currently pain is better.  No fevers.  Workup included CTA was concerning for visceral vessel stenosis.  Consult requested for evaluation.      Past Medical History:        Diagnosis Date   ??? CAD (coronary artery disease)    ??? Diabetes ([place])    ??? H/O cardiovascular stress test 07/03/2017    scan shows moderate size, severe intensity, non reversible perfusion defect in inferolateral wall, normal LVEF.   ??? H/O echocardiogram 05/30/2017    Left ventricular systolic function is normal. Ejection fraction is visually estimated at >55%.   ??? Hyperlipidemia    ??? Hypertension    ??? Nerve pain    ??? Scoliosis      Past Surgical History:        Procedure Laterality Date   ??? PR SONO GUIDE NEEDLE BIOPSY       Current Medications:   No current facility-administered medications for this encounter.   Allergies:  No Known Allergies    Social History:   Social History     Socioeconomic History   ??? Marital status: Single     Spouse name: Not on file   ??? Number of children: Not on file   ??? Years of education: Not on file   ??? Highest education level: Not on file   Occupational History   ??? Not on file   Social Needs   ??? Financial resource strain: Not on file   ??? Food insecurity:     Worry: Not on file     Inability: Not on file   ??? Transportation needs:     Medical: Not on file     Non-medical: Not on file   Tobacco Use   ??? Smoking status: Current Every Day Smoker     Packs/day: 1.00     Types: Cigarettes   ??? Smokeless tobacco: Never Used   Substance and Sexual Activity   ??? Alcohol use: No   ??? Drug use: Yes     Frequency: 3.0 times per week     Types: Marijuana   ??? Sexual activity: Not on file   Lifestyle   ??? Physical activity:     Days per week:  Not on file     Minutes per session: Not on file   ??? Stress: Not on file   Relationships   ??? Social connections:     Talks on phone: Not on file     Gets together: Not on file     Attends religious service: Not on file     Active member of club or organization: Not on file     Attends meetings of clubs or organizations: Not on file     Relationship status: Not on file   ??? Intimate partner violence:     Fear of current or ex partner: Not on file     Emotionally abused: Not on file     Physically abused: Not on file     Forced sexual activity: Not on file   Other Topics Concern   ??? Not on file  Social History Narrative   ??? Not on file       Family History:        Problem Relation Age of Onset   ??? Stroke Sister    ??? Heart Attack Brother        REVIEW OF SYSTEMS:  ROS: Is as noted above in HPI. Complete system review is done and is negative except as noted above.     EXAM:  Blood pressure (!) 146/107, pulse 72, temperature 98.3 ??F (36.8 ??C), temperature source Oral, resp. rate 22, height 5\' 7"  (1.702 m), weight 184 lb 4 oz (83.6 kg), SpO2 100 %.  General appearance: No apparent distress, appears stated age and cooperative.  Skin: unremarkable,   HEENT Normocephalic, atraumatic without obvious deformity.   Neck: Supple, Trachea midline   Lungs: Good respiratory effort. Clear to auscultation,  Heart: Regular rate/ rhythm   Abdomen: Soft, non-tender or non-distended   Neurologic: Alert, grossly intact  Mental status: normal affect    DATA:  CTA-- Suspected moderate to high-grade stenosis at the origin of the inferior mesenteric artery. ??The celiac and superior mesenteric arteries are widely patent.        IMPRESSION  Patient Active Problem List   Diagnosis   ??? Chest pain   ??? Lower abdominal pain   ??? Anemia         RECOMMENDATIONS:    IMA is well collateralized with SMA and celiac.  No intervention recommended for IMA stenosis.  Reiterated importance of tobacco cessation.      Aquilla Solian, MD

## 2017-06-02 NOTE — Discharge Summary (Signed)
Ricardo Foley Aug 05, 1960 [id number]  PCP:  Hazel Sams, APRN - CNP    Admit date: 05/30/2017  Admitting Physician: Lester Kinsman, MD    Discharge date: 06/09/2017 Discharge Physician: Huston Foley, MD         Discharge Diagnoses.  As per below    Hospital Course:  History of present illness at admission: As per H&P  Subsequent Hospital Course:     ??? Chest pain: Cardiology evaluated and pending echo.  No ACS.  Outpatient stress test.     DC home on  current aspirin, statin and maintain good BP control.  ??? Diffuse nonspecific abdominal pain/leukocytosis: I don't see any evidence of diverticulitis on CT.    However given his clinical symptoms continued antibiotics Cipro/Flagyl.  DC home with no antibiotics.    I think the leukocytosis could be stress related however patient is chronically elevated WBC.    Advised to follow-up with hematology outpatient.  ??? CTA with IMA high grade stenosis but widely patent SMA and celiac arteries. Pt also has renal artery stenosis.     This is unlikely the cause of abdominal pain as he has good collateral flow.Evaluated by vascular surgery  ??? Nausea vomiting and abdominal pain more than likely from cannabis long term use.    Continue nausea and pain control.  However patient is not happy with current assessment.  ??? Diabetes mellitus: A1c 7.2.  Continue sliding scale.  Started on metformin  ??? Tobacco abuse: Smoking cessation counseling provided.  Nicoderm patch  ??  VTE prophylaxis LMWH        On the day of discharge, pt felt better. No new complaints.    Pertinent Exam Findings on Day of Discharge:  General Appearance:    Alert, cooperative, no distress, appears stated age  Head:    Normocephalic, without obvious abnormality, atraumatic  Eyes:    PERRL, conjunctiva/corneas clear, EOM's intact  Lungs:    Clear to auscultation bilaterally, respirations unlabored   Heart:    Regular rate and rhythm, S1 and S2 normal, no murmur,   rub or gallop  Abdomen:     Soft, non-tender, bowel  sounds active, no masses, no organomegaly  Extremities:   Extremities normal, atraumatic, no cyanosis or edema    Consults:  IP CONSULT TO HOSPITALIST  IP CONSULT TO CARDIOLOGY  IP CONSULT TO GI  IP CONSULT TO GENERAL SURGERY  IP CONSULT TO VASCULAR SURGERY    Patient Instructions:   Nina, Hoar   Home Medication Instructions [phone]    Printed on:06/09/17 1241   Medication Information                      aspirin 81 MG chewable tablet  Take 1 tablet by mouth daily             atorvastatin (LIPITOR) 20 MG tablet  Take 20 mg by mouth daily             cyclobenzaprine (FLEXERIL) 10 MG tablet  Take 1 tablet by mouth 3 times daily as needed for Muscle spasms             dicyclomine (BENTYL) 10 MG capsule  Take 1 capsule by mouth 4 times daily (before meals and nightly)             DULoxetine (CYMBALTA) 30 MG extended release capsule  Take 1 capsule by mouth daily  gabapentin (NEURONTIN) 300 MG capsule  Take 1 capsule by mouth 3 times daily for 10 days..             hydrOXYzine (VISTARIL) 50 MG capsule  Take 50 mg by mouth 3 times daily as needed for Itching             metFORMIN (GLUCOPHAGE) 1000 MG tablet  Take 1,000 mg by mouth 2 times daily (with meals)             nicotine (NICODERM CQ) 21 MG/24HR  Place 1 patch onto the skin daily             ondansetron (ZOFRAN ODT) 4 MG disintegrating tablet  Take 1 tablet by mouth every 8 hours as needed for Nausea or Vomiting             pantoprazole sodium (PROTONIX) 40 MG PACK packet  Take 1 packet by mouth every morning (before breakfast)                   Diet:  Diabetic diet    Activity:   activity as tolerated     Discharge Condition:   good    Disposition:   home    Follow-up    Hazel Sams, APRN - CNP  Schedule an appointment as soon as possible for a visit  Medical Management  [place] [address]  [phone]   Corinna Capra, MD  Go to  Medical Management of Nonspecific Abdominal pains  [address], [place] [address]  [phone]   Yisroel Ramming, MD  Schedule an appointment as soon as possible for a visit in 8 weeks  Medical Management of Chronic WBC elevation  [place] [address]  [phone]     Kellie Simmering, MD  Schedule an appointment as soon as possible for a visit  OP stress test  [address] [place] [address]  [phone]            Discharge Physician Signed: Huston Foley

## 2017-06-08 ENCOUNTER — Encounter: Attending: Emergency Medicine | Primary: Family

## 2017-06-11 ENCOUNTER — Inpatient Hospital Stay: Payer: PRIVATE HEALTH INSURANCE | Primary: Family

## 2017-06-11 DIAGNOSIS — D72829 Elevated white blood cell count, unspecified: Secondary | ICD-10-CM

## 2017-06-11 LAB — COMPREHENSIVE METABOLIC PANEL
ALT: 28 U/L (ref 10–40)
AST: 15 IU/L (ref 15–37)
Albumin: 4.5 GM/DL (ref 3.4–5.0)
Alkaline Phosphatase: 57 IU/L (ref 40–129)
Anion Gap: 10 (ref 4–16)
BUN: 11 MG/DL (ref 6–23)
CO2: 26 MMOL/L (ref 21–32)
Calcium: 9.5 MG/DL (ref 8.3–10.6)
Chloride: 100 mMol/L (ref 99–110)
Creatinine: 0.9 MG/DL (ref 0.9–1.3)
GFR African American: >60 mL/min/{1.73_m2} (ref >60)
GFR Non-African American: >60 mL/min/{1.73_m2} (ref >60)
Glucose: 138 MG/DL — ABNORMAL HIGH (ref 70–99)
Potassium: 4.9 MMOL/L (ref 3.5–5.1)
Sodium: 136 MMOL/L (ref 135–145)
Total Bilirubin: 0.2 MG/DL (ref 0.0–1.0)
Total Protein: 6.5 GM/DL (ref 6.4–8.2)

## 2017-06-11 LAB — LACTATE DEHYDROGENASE: LD: 137 IU/L (ref 120–246)

## 2017-06-11 LAB — SEDIMENTATION RATE: Sed Rate: 2 MM/HR (ref 0–20)

## 2017-06-22 ENCOUNTER — Ambulatory Visit
Admit: 2017-06-22 | Discharge: 2017-06-22 | Payer: PRIVATE HEALTH INSURANCE | Attending: Emergency Medicine | Primary: Family

## 2017-06-22 DIAGNOSIS — I208 Other forms of angina pectoris: Secondary | ICD-10-CM

## 2017-06-22 MED ORDER — OXYCODONE-ACETAMINOPHEN 5-325 MG PO TABS
5-325 MG | ORAL_TABLET | Freq: Four times a day (QID) | ORAL | 0 refills | Status: DC | PRN
Start: 2017-06-22 — End: 2017-07-19

## 2017-06-22 NOTE — Addendum Note (Signed)
Addended by: Shann Medal A on: 06/22/2017 10:19 AM     Modules accepted: Orders

## 2017-06-22 NOTE — Progress Notes (Addendum)
[facility] Linard Millers, MD        OFFICE  FOLLOWUP NOTE    Chief complaints: patient is here for management of chest pain, DM, HTN,  DYSLPIDEMIA, PAD    Subjective: pain all over body, neck spurs, and toe pain    HPI Ricardo Foley is a 57 y.o.year old who  has a past medical history of CAD (coronary artery disease), Diabetes ([place]), H/O echocardiogram, Hyperlipidemia, Hypertension, Nerve pain, and Scoliosis. and presents for management of  chest pain, DM, HTN,  DYSLPIDEMIA, PAD which are NOT well controlled    Patient was discharged from hospital, he had inferior mesenteric artery stenosis, however as per sx it was not causing abdominal pain, he had chest pain also in hospital which was atypical, he was supposed to see hematology for chronically elevated WBC and pain management.  Current Outpatient Medications   Medication Sig Dispense Refill   ??? lisinopril (PRINIVIL;ZESTRIL) 20 MG tablet Take 20 mg by mouth daily     ??? ondansetron (ZOFRAN ODT) 4 MG disintegrating tablet Take 1 tablet by mouth every 8 hours as needed for Nausea or Vomiting 20 tablet 0   ??? aspirin 81 MG chewable tablet Take 1 tablet by mouth daily 30 tablet 3   ??? dicyclomine (BENTYL) 10 MG capsule Take 1 capsule by mouth 4 times daily (before meals and nightly) 30 capsule 0   ??? pantoprazole sodium (PROTONIX) 40 MG PACK packet Take 1 packet by mouth every morning (before breakfast) 30 each 3   ??? metFORMIN (GLUCOPHAGE) 1000 MG tablet Take 1,000 mg by mouth 2 times daily (with meals)     ??? hydrOXYzine (VISTARIL) 50 MG capsule Take 50 mg by mouth 3 times daily as needed for Itching     ??? atorvastatin (LIPITOR) 20 MG tablet Take 20 mg by mouth daily     ??? cyclobenzaprine (FLEXERIL) 10 MG tablet Take 1 tablet by mouth 3 times daily as needed for Muscle spasms 20 tablet 0   ??? DULoxetine (CYMBALTA) 30 MG extended release capsule Take 1 capsule by mouth daily 30 capsule 0   ??? gabapentin (NEURONTIN) 300 MG capsule Take 1 capsule by mouth 3 times daily for 10  days.. 30 capsule 0   ??? nicotine (NICODERM CQ) 21 MG/24HR Place 1 patch onto the skin daily 30 patch 3     No current facility-administered medications for this visit.      Allergies: Patient has no known allergies.  Past Medical History:   Diagnosis Date   ??? CAD (coronary artery disease)    ??? Diabetes ([place])    ??? H/O echocardiogram 05/30/2017    Left ventricular systolic function is normal. Ejection fraction is visually estimated at >55%.   ??? Hyperlipidemia    ??? Hypertension    ??? Nerve pain    ??? Scoliosis      Past Surgical History:   Procedure Laterality Date   ??? PR SONO GUIDE NEEDLE BIOPSY       Family History   Problem Relation Age of Onset   ??? Stroke Sister    ??? Heart Attack Brother      Social History     Tobacco Use   ??? Smoking status: Current Every Day Smoker     Packs/day: 1.00     Types: Cigarettes   ??? Smokeless tobacco: Never Used   Substance Use Topics   ??? Alcohol use: No      @ROSAKH @  Review of Systems:   ??  Constitutional: No Fever or Weight Loss   ?? Eyes: No Decreased Vision  ?? ENT: No Headaches, Hearing Loss or Vertigo  ?? Cardiovascular: + chest pain, dyspnea on exertion, palpitations or loss of consciousness  ?? Respiratory: No cough or wheezing    ?? Gastrointestinal: No abdominal pain, appetite loss, blood in stools, constipation, diarrhea or heartburn  ?? Genitourinary: No dysuria, trouble voiding, or hematuria  ?? Musculoskeletal:  No gait disturbance, weakness or joint complaints  ?? Integumentary: No rash or pruritis  ?? Neurological: No TIA or stroke symptoms  ?? Psychiatric: No anxiety or depression  ?? Endocrine: No malaise, fatigue or temperature intolerance  ?? Hematologic/Lymphatic: No bleeding problems, blood clots or swollen lymph nodes  ?? Allergic/Immunologic: No nasal congestion or hives  All systems negative except as marked.   Objective:  BP 126/76 (Site: Left Upper Arm, Position: Sitting, Cuff Size: Medium Adult)    Pulse 80    Resp 16    Ht 5\' 6"  (1.676 m)    Wt 180 lb (81.6 kg)    BMI  29.05 kg/m??   Wt Readings from Last 3 Encounters:   06/22/17 180 lb (81.6 kg)   06/01/17 184 lb 4 oz (83.6 kg)   05/11/17 189 lb (85.7 kg)     Body mass index is 29.05 kg/m??.  GENERAL - Alert, oriented, pleasant, in no apparent distress,normal grooming  HEENT - pupils are reactive to light and accomodation, cornea intact, conjunctive normal color, ears are normal in exam,throat exam in normal, teeth, gum and palate are normal, oral mucosa is normal without any notation of pallor or cyanosis  Neck - Supple.  No jugular venous distention noted. No carotid bruits, no apical -carotid delay  Respiratory:  Normal breath sounds, No respiratory distress, No wheezing, No chest tenderness.,no use of accessory muscles, diaphragm movement is normal  Cardiovascular: (PMI) apex non displaced,no lifts no thrills, no s3,no s4, Normal heart rate, Normal rhythm, No murmurs, No rubs, No gallops. Carotid arteries pulse and amplitude are normal no bruit, no abdominal bruit noted ( normal abdominal aorta ausculation), femoral arteries pulse and amplitude are normal no bruit, pedal pulses are normal  Femoral pulses have normal amplitude, no bruits   Extremities - No cyanosis, clubbing, or significant edema, no varicose veins    Abdomen - No masses, tenderness, or organomegaly, no hepato-splenomegally, no bruits  Musculoskeletal - No significant edema, no kyphosis or scoliosis, no deformity in any extremity noted, muscle strength and tone are normal  Skin: no ulcer,no scar,no stasis dermatitis   Neurologic - alert oriented times 3,Cranial nerves II through XII are grossly intact.  There were no gross focal neurologic abnormalities. All sensory and motor nerves examined and were normal  Psychiatric: normal mood and affect    No results found for: CKTOTAL, CKMB, CKMBINDEX, TROPONINI  BNP:  No results found for: BNP  PT/INR:  No results found for: PTINR  Lab Results   Component Value Date    LABA1C 7.2 (H) 05/31/2017     Lab Results    Component Value Date    CHOL 115 03/12/2017    TRIG 186 (H) 03/12/2017    HDL 43 03/12/2017     Lab Results   Component Value Date    ALT 28 06/11/2017    AST 15 06/11/2017     TSH:    Lab Results   Component Value Date    TSH 1.42 03/12/2017       Impression:  Ricardo Foley  is a 57 y.o.year old who  has a past medical history of CAD (coronary artery disease), Diabetes ([place]), H/O echocardiogram, Hyperlipidemia, Hypertension, Nerve pain, and Scoliosis. and presents with     Plan:  1. Chest pain:stress test ordered, he cannot do treadmill test due to severe pain in legs with PAD and scolosis in spine, will get lexiscan stress test  2. PAD: arterial doppler ordered  3. PAIN in neck, back and legs: will give hydrocodone for few days only until seen by pain clinic  4. Elevated WBC: patient always has elevated WBC, R/o blood disorder  5. HTN: controlled, continue  Lisinopril  6. DM: CONTINUE insulin  7. Dyslipidemia; stable, continue statins      All labs, medications and tests reviewed, continue all other medications of all above medical condition listed as is.

## 2017-07-02 LAB — IGG, IGA, IGM
IgA: 251 MG/DL (ref 69–382)
IgG, Serum: 514 MG/DL — ABNORMAL LOW (ref 723–1685)
IgM,Serum: 39 MG/DL — ABNORMAL LOW (ref 62–277)

## 2017-07-03 LAB — NM MYOCARDIAL SPECT REST EXERCISE OR RX: Left Ventricular Ejection Fraction: 54

## 2017-07-03 NOTE — Progress Notes (Signed)
11:12 AM    07/03/17    Site: antecubital right, condition patent and no redness    # of attempts: 1

## 2017-07-04 LAB — BETA 2 MICROGLOBULIN, SERUM: Beta-2 Microglobulin: 2.3 mg/L (ref 1.1–2.4)

## 2017-07-05 NOTE — Telephone Encounter (Signed)
Notified pt of ABN NM and he is coming in Monday.        ??Summary   ??Supervising physician Dr. Drema Pry . Abnormal stress test, Myocardial   ??perfusion scan shows moderate size, severe intensity, non reversible   ??perfusion defect in inferolateral wall, normal LVEF   ??   ??Recommendation   ??OFFICE VISIT TO DISCUSS RESULTS   ??

## 2017-07-09 NOTE — Progress Notes (Signed)
[facility] Linard Millers, MD        OFFICE  FOLLOWUP NOTE    Chief complaints: patient is here for management of chest pain, DM, HTN,  DYSLPIDEMIA, PAD, lukemia      Subjective: + chest pain, no shortness of breath, no dizziness, no palpitations    HPI Ricardo Foley is a 57 y.o.year old who  has a past medical history of CAD (coronary artery disease), Diabetes ([place]), H/O cardiovascular stress test, H/O echocardiogram, Hyperlipidemia, Hypertension, Nerve pain, and Scoliosis. and presents for management of chest pain, DM, HTN,  DYSLPIDEMIA, PAD which are well controlled  Patient found out that he has lukemia    Current Outpatient Medications   Medication Sig Dispense Refill   ??? lisinopril (PRINIVIL;ZESTRIL) 20 MG tablet Take 20 mg by mouth daily     ??? ondansetron (ZOFRAN ODT) 4 MG disintegrating tablet Take 1 tablet by mouth every 8 hours as needed for Nausea or Vomiting 20 tablet 0   ??? aspirin 81 MG chewable tablet Take 1 tablet by mouth daily 30 tablet 3   ??? nicotine (NICODERM CQ) 21 MG/24HR Place 1 patch onto the skin daily 30 patch 3   ??? dicyclomine (BENTYL) 10 MG capsule Take 1 capsule by mouth 4 times daily (before meals and nightly) 30 capsule 0   ??? pantoprazole sodium (PROTONIX) 40 MG PACK packet Take 1 packet by mouth every morning (before breakfast) 30 each 3   ??? metFORMIN (GLUCOPHAGE) 1000 MG tablet Take 1,000 mg by mouth 2 times daily (with meals)     ??? hydrOXYzine (VISTARIL) 50 MG capsule Take 50 mg by mouth 3 times daily as needed for Itching     ??? atorvastatin (LIPITOR) 20 MG tablet Take 20 mg by mouth daily     ??? cyclobenzaprine (FLEXERIL) 10 MG tablet Take 1 tablet by mouth 3 times daily as needed for Muscle spasms 20 tablet 0   ??? DULoxetine (CYMBALTA) 30 MG extended release capsule Take 1 capsule by mouth daily 30 capsule 0   ??? gabapentin (NEURONTIN) 300 MG capsule Take 1 capsule by mouth 3 times daily for 10 days.. 30 capsule 0     No current facility-administered medications for this visit.       Allergies: Patient has no known allergies.  Past Medical History:   Diagnosis Date   ??? CAD (coronary artery disease)    ??? Diabetes ([place])    ??? H/O cardiovascular stress test 07/03/2017    scan shows moderate size, severe intensity, non reversible perfusion defect in inferolateral wall, normal LVEF.   ??? H/O echocardiogram 05/30/2017    Left ventricular systolic function is normal. Ejection fraction is visually estimated at >55%.   ??? Hyperlipidemia    ??? Hypertension    ??? Nerve pain    ??? Scoliosis      Past Surgical History:   Procedure Laterality Date   ??? PR SONO GUIDE NEEDLE BIOPSY       Family History   Problem Relation Age of Onset   ??? Stroke Sister    ??? Heart Attack Brother      Social History     Tobacco Use   ??? Smoking status: Current Every Day Smoker     Packs/day: 1.50     Types: Cigarettes   ??? Smokeless tobacco: Never Used   Substance Use Topics   ??? Alcohol use: No      @ROSAKH @  Review of Systems:   ?? Constitutional: No Fever or  Weight Loss   ?? Eyes: No Decreased Vision  ?? ENT: No Headaches, Hearing Loss or Vertigo  ?? Cardiovascular: No chest pain, dyspnea on exertion, palpitations or loss of consciousness  ?? Respiratory: No cough or wheezing    ?? Gastrointestinal: No abdominal pain, appetite loss, blood in stools, constipation, diarrhea or heartburn  ?? Genitourinary: No dysuria, trouble voiding, or hematuria  ?? Musculoskeletal:  No gait disturbance, weakness or joint complaints  ?? Integumentary: No rash or pruritis  ?? Neurological: No TIA or stroke symptoms  ?? Psychiatric: No anxiety or depression  ?? Endocrine: No malaise, fatigue or temperature intolerance  ?? Hematologic/Lymphatic: No bleeding problems, blood clots or swollen lymph nodes  ?? Allergic/Immunologic: No nasal congestion or hives  All systems negative except as marked.   Objective:  BP 116/70    Pulse 84    Ht 5\' 6"  (1.676 m)    Wt 183 lb (83 kg)    BMI 29.54 kg/m??   Wt Readings from Last 3 Encounters:   07/09/17 183 lb (83 kg)   06/22/17 180  lb (81.6 kg)   06/01/17 184 lb 4 oz (83.6 kg)     Body mass index is 29.54 kg/m??.  GENERAL - Alert, oriented, pleasant, in no apparent distress,normal grooming  HEENT - pupils are reactive to light and accomodation, cornea intact, conjunctive normal color, ears are normal in exam,throat exam in normal, teeth, gum and palate are normal, oral mucosa is normal without any notation of pallor or cyanosis  Neck - Supple.  No jugular venous distention noted. No carotid bruits, no apical -carotid delay  Respiratory:  Normal breath sounds, No respiratory distress, No wheezing, No chest tenderness.,no use of accessory muscles, diaphragm movement is normal  Cardiovascular: (PMI) apex non displaced,no lifts no thrills, no s3,no s4, Normal heart rate, Normal rhythm, No murmurs, No rubs, No gallops. Carotid arteries pulse and amplitude are normal no bruit, no abdominal bruit noted ( normal abdominal aorta ausculation), femoral arteries pulse and amplitude are normal no bruit, pedal pulses are normal  Femoral pulses have normal amplitude, no bruits   Extremities - No cyanosis, clubbing, or significant edema, no varicose veins    Abdomen - No masses, tenderness, or organomegaly, no hepato-splenomegally, no bruits  Musculoskeletal - No significant edema, no kyphosis or scoliosis, no deformity in any extremity noted, muscle strength and tone are normal  Skin: no ulcer,no scar,no stasis dermatitis   Neurologic - alert oriented times 3,Cranial nerves II through XII are grossly intact.  There were no gross focal neurologic abnormalities. All sensory and motor nerves examined and were normal  Psychiatric: normal mood and affect    No results found for: CKTOTAL, CKMB, CKMBINDEX, TROPONINI  BNP:  No results found for: BNP  PT/INR:  No results found for: PTINR  Lab Results   Component Value Date    LABA1C 7.2 (H) 05/31/2017     Lab Results   Component Value Date    CHOL 115 03/12/2017    TRIG 186 (H) 03/12/2017    HDL 43 03/12/2017     Lab  Results   Component Value Date    ALT 28 06/11/2017    AST 15 06/11/2017     TSH:    Lab Results   Component Value Date    TSH 1.42 03/12/2017       Impression:  Ricardo Foley is a 57 y.o.year old who  has a past medical history of CAD (coronary artery disease), Diabetes ([place]),  H/O cardiovascular stress test, H/O echocardiogram, Hyperlipidemia, Hypertension, Nerve pain, and Scoliosis. and presents with     Plan:  1. Chest pain:stress test is abnromal, will get LHC, LHC scheduled,risk and benefit explained and consent obtained  2. PAD: arterial doppler ordered  3. PAIN in neck, back and legs: will give hydrocodone for few days only until seen by pain clinic  4. LUKEMIA, AS PER ONCOLOGIST  5. HTN: controlled, continue  Lisinopril  6. DM: CONTINUE insulin  7. Dyslipidemia; stable, continue statins

## 2017-07-09 NOTE — Progress Notes (Signed)
Pt here in office & educated on [facility] for Dx: ABN NM, scheduled for 08/02/17 @ 10, w/arrival @ 8, @ [facility]; risks explained; & consents signed. Pre-admission orders given to pt for labs & CXR, which are due 07/31/17 @ [facility]. Instructions given to pt to:  NPO after midnight night before procedure; Call hospital @ [phone] to pre-register; May take rest of morning meds.am of procedure; & pt voiced understanding. Copies of consent, pre-testing orders, & info.sheet given to Casi.

## 2017-07-11 ENCOUNTER — Ambulatory Visit: Admit: 2017-07-11 | Discharge: 2017-07-11 | Payer: PRIVATE HEALTH INSURANCE | Primary: Family

## 2017-07-11 DIAGNOSIS — M79605 Pain in left leg: Secondary | ICD-10-CM

## 2017-07-11 NOTE — Progress Notes (Signed)
BLE Arterial complete

## 2017-07-12 LAB — FISH-BONE MARROW/BLOOD

## 2017-07-12 LAB — MISCELLANEOUS SENDOUT 2: CPT Code: 81263

## 2017-07-12 NOTE — Telephone Encounter (Signed)
Notified of ABN study and pt will come in 07/19/17.        ??Summary   ??   ??Monophasic waveforms of the right CFA suggestive of inflow disease of the   ??right lower extremity.   ??The Right Proximal and mid SFA exhibits an occlusion.   ??The Left SFA exhibits an occlusion between the mid and distal portions.   ??Right ABIs show Severe peripheral arterial disease.   ??The Left ABI shows Moderate peripheral arterial disease.   ??   ??Recommendations   ??   ??OFFICE visit to discuss results

## 2017-07-13 ENCOUNTER — Encounter

## 2017-07-13 ENCOUNTER — Inpatient Hospital Stay: Admit: 2017-07-13 | Payer: PRIVATE HEALTH INSURANCE | Primary: Family

## 2017-07-13 DIAGNOSIS — E049 Nontoxic goiter, unspecified: Secondary | ICD-10-CM

## 2017-07-13 DIAGNOSIS — C9110 Chronic lymphocytic leukemia of B-cell type not having achieved remission: Secondary | ICD-10-CM

## 2017-07-13 LAB — POCT CREATININE
GFR African American: >60 mL/min/{1.73_m2} (ref >60)
GFR Non-African American: >60 mL/min/{1.73_m2} (ref >60)
POC Creatinine: 0.8 MG/DL — ABNORMAL LOW (ref 0.9–1.3)

## 2017-07-13 MED ORDER — NORMAL SALINE FLUSH 0.9 % IV SOLN
0.9 | INTRAVENOUS | Status: AC
Start: 2017-07-13 — End: 2017-07-13

## 2017-07-13 MED ORDER — NORMAL SALINE FLUSH 0.9 % IV SOLN
0.9 % | Freq: Once | INTRAVENOUS | Status: AC
Start: 2017-07-13 — End: 2017-07-13
  Administered 2017-07-13: 17:00:00 10 mL via INTRAVENOUS

## 2017-07-13 MED ORDER — IOPAMIDOL 76 % IV SOLN
76 % | Freq: Once | INTRAVENOUS | Status: AC | PRN
Start: 2017-07-13 — End: 2017-07-13
  Administered 2017-07-13: 17:00:00 75 mL via INTRAVENOUS

## 2017-07-13 MED ORDER — SODIUM CHLORIDE 0.9 % IJ SOLN
0.9 % | Freq: Once | INTRAMUSCULAR | Status: AC
Start: 2017-07-13 — End: 2017-07-13
  Administered 2017-07-13: 17:00:00 50 mL via INTRAVENOUS

## 2017-07-13 MED FILL — NORMAL SALINE FLUSH 0.9 % IV SOLN: 0.9 % | INTRAVENOUS | Qty: 90

## 2017-07-19 ENCOUNTER — Ambulatory Visit
Admit: 2017-07-19 | Discharge: 2017-07-19 | Payer: PRIVATE HEALTH INSURANCE | Attending: Emergency Medicine | Primary: Family

## 2017-07-19 DIAGNOSIS — M79605 Pain in left leg: Secondary | ICD-10-CM

## 2017-07-19 MED ORDER — OXYCODONE-ACETAMINOPHEN 5-325 MG PO TABS
5-325 MG | ORAL_TABLET | Freq: Four times a day (QID) | ORAL | 0 refills | Status: AC | PRN
Start: 2017-07-19 — End: 2017-07-22

## 2017-07-19 NOTE — Telephone Encounter (Signed)
Pt here in office & educated on Peripheral Angiogram for Dx: ABN arterial doppler, scheduled for 08/02/17 @ 10 AM, w/arrival @ 8 AM, @ [facility] risks explained; & consents signed. Pre-admission orders given to pt for labs & CXR, which are due 07/31/17 @ [place]. Instructions given to pt to:  NPO after midnight night before procedure; Call hospital @ [phone] to pre-register; May take rest of morning meds.am of procedure; & pt voiced understanding. Copies of consent, pre-testing orders, & info.sheet given to Casi.    Per Dr. Drema Pry, patient has LHC already scheduled for this date/time.  Adding peripheral angiogram procedure after LHC.

## 2017-07-19 NOTE — Progress Notes (Signed)
[facility] Linard Millers, MD        OFFICE  FOLLOWUP NOTE    Chief complaints: patient is here for management of chest pain,??DM, HTN, ??DYSLPIDEMIA, PAD, lukemia        Subjective: leg pain and chest pain    HPI Ricardo Foley is a 57 y.o.year old who  has a past medical history of CAD (coronary artery disease), Diabetes ([place]), H/O cardiovascular stress test, H/O Doppler lower arterial ultrasound, H/O echocardiogram, Hyperlipidemia, Hypertension, Nerve pain, PVD (peripheral vascular disease) ([place]), and Scoliosis. and presents for management of chest pain,??DM, HTN, ??DYSLPIDEMIA, PAD, lukemia which are well controlled    Patient has abnormal stress test and abnormal lower extremity doppler  Current Outpatient Medications   Medication Sig Dispense Refill   ??? tiZANidine HCl (ZANAFLEX PO) Take by mouth     ??? Famotidine (PEPCID PO) Take by mouth     ??? Docusate Sodium (COLACE PO) Take by mouth     ??? lisinopril (PRINIVIL;ZESTRIL) 20 MG tablet Take 20 mg by mouth daily     ??? ondansetron (ZOFRAN ODT) 4 MG disintegrating tablet Take 1 tablet by mouth every 8 hours as needed for Nausea or Vomiting 20 tablet 0   ??? aspirin 81 MG chewable tablet Take 1 tablet by mouth daily 30 tablet 3   ??? dicyclomine (BENTYL) 10 MG capsule Take 1 capsule by mouth 4 times daily (before meals and nightly) 30 capsule 0   ??? pantoprazole sodium (PROTONIX) 40 MG PACK packet Take 1 packet by mouth every morning (before breakfast) 30 each 3   ??? metFORMIN (GLUCOPHAGE) 1000 MG tablet Take 1,000 mg by mouth 2 times daily (with meals)     ??? hydrOXYzine (VISTARIL) 50 MG capsule Take 50 mg by mouth 3 times daily as needed for Itching     ??? atorvastatin (LIPITOR) 20 MG tablet Take 20 mg by mouth daily     ??? DULoxetine (CYMBALTA) 30 MG extended release capsule Take 1 capsule by mouth daily 30 capsule 0   ??? gabapentin (NEURONTIN) 300 MG capsule Take 1 capsule by mouth 3 times daily for 10 days.. 30 capsule 0     No current facility-administered medications for  this visit.      Allergies: Patient has no known allergies.  Past Medical History:   Diagnosis Date   ??? CAD (coronary artery disease)    ??? Diabetes ([place])    ??? H/O cardiovascular stress test 07/03/2017    scan shows moderate size, severe intensity, non reversible perfusion defect in inferolateral wall, normal LVEF.   ??? H/O Doppler lower arterial ultrasound 07/11/2017    The Right Proximal and mid SFA exhibits an occlusion. The Left SFA exhibits an occlusion between the mid and distal portions. Right ABIs show Severe peripheral arterial disease.   ??? H/O echocardiogram 05/30/2017    Left ventricular systolic function is normal. Ejection fraction is visually estimated at >55%.   ??? Hyperlipidemia    ??? Hypertension    ??? Nerve pain    ??? PVD (peripheral vascular disease) ([place]) 07/11/2017    See ABN Arterial US.   ??? Scoliosis      Past Surgical History:   Procedure Laterality Date   ??? PR SONO GUIDE NEEDLE BIOPSY       Family History   Problem Relation Age of Onset   ??? Stroke Sister    ??? Heart Attack Brother      Social History     Tobacco Use   ???  Smoking status: Current Every Day Smoker     Packs/day: 1.50     Types: Cigarettes   ??? Smokeless tobacco: Never Used   Substance Use Topics   ??? Alcohol use: No      @ROSAKH @  Review of Systems:   ?? Constitutional: No Fever or Weight Loss   ?? Eyes: No Decreased Vision  ?? ENT: No Headaches, Hearing Loss or Vertigo  ?? Cardiovascular: No chest pain, dyspnea on exertion, palpitations or loss of consciousness  ?? Respiratory: No cough or wheezing    ?? Gastrointestinal: No abdominal pain, appetite loss, blood in stools, constipation, diarrhea or heartburn  ?? Genitourinary: No dysuria, trouble voiding, or hematuria  ?? Musculoskeletal:  No gait disturbance, weakness or joint complaints  ?? Integumentary: No rash or pruritis  ?? Neurological: No TIA or stroke symptoms  ?? Psychiatric: No anxiety or depression  ?? Endocrine: No malaise, fatigue or temperature intolerance  ?? Hematologic/Lymphatic:  No bleeding problems, blood clots or swollen lymph nodes  ?? Allergic/Immunologic: No nasal congestion or hives  All systems negative except as marked.   Objective:  BP (!) 98/56    Pulse 68    Resp 18    Wt 182 lb (82.6 kg)    BMI 29.38 kg/m??   Wt Readings from Last 3 Encounters:   07/19/17 182 lb (82.6 kg)   07/09/17 183 lb (83 kg)   06/22/17 180 lb (81.6 kg)     Body mass index is 29.38 kg/m??.  GENERAL - Alert, oriented, pleasant, in no apparent distress,normal grooming  HEENT - pupils are reactive to light and accomodation, cornea intact, conjunctive normal color, ears are normal in exam,throat exam in normal, teeth, gum and palate are normal, oral mucosa is normal without any notation of pallor or cyanosis  Neck - Supple.  No jugular venous distention noted. No carotid bruits, no apical -carotid delay  Respiratory:  Normal breath sounds, No respiratory distress, No wheezing, No chest tenderness.,no use of accessory muscles, diaphragm movement is normal  Cardiovascular: (PMI) apex non displaced,no lifts no thrills, no s3,no s4, Normal heart rate, Normal rhythm, No murmurs, No rubs, No gallops. Carotid arteries pulse and amplitude are normal no bruit, no abdominal bruit noted ( normal abdominal aorta ausculation), femoral arteries pulse and amplitude are normal no bruit, pedal pulses are normal  Femoral pulses have normal amplitude, no bruits   Extremities - No cyanosis, clubbing, or significant edema, no varicose veins    Abdomen - No masses, tenderness, or organomegaly, no hepato-splenomegally, no bruits  Musculoskeletal - No significant edema, no kyphosis or scoliosis, no deformity in any extremity noted, muscle strength and tone are normal  Skin: no ulcer,no scar,no stasis dermatitis   Neurologic - alert oriented times 3,Cranial nerves II through XII are grossly intact.  There were no gross focal neurologic abnormalities. All sensory and motor nerves examined and were normal  Psychiatric: normal mood and  affect    No results found for: CKTOTAL, CKMB, CKMBINDEX, TROPONINI  BNP:  No results found for: BNP  PT/INR:  No results found for: PTINR  Lab Results   Component Value Date    LABA1C 7.2 (H) 05/31/2017     Lab Results   Component Value Date    CHOL 115 03/12/2017    TRIG 186 (H) 03/12/2017    HDL 43 03/12/2017     Lab Results   Component Value Date    ALT 28 06/11/2017    AST 15 06/11/2017  TSH:    Lab Results   Component Value Date    TSH 1.42 03/12/2017       Impression:  Ricardo Foley is a 57 y.o.year old who  has a past medical history of CAD (coronary artery disease), Diabetes ([place]), H/O cardiovascular stress test, H/O Doppler lower arterial ultrasound, H/O echocardiogram, Hyperlipidemia, Hypertension, Nerve pain, PVD (peripheral vascular disease) ([place]), and Scoliosis. and presents with     Plan:  1. Chest pain:stress test??is abnromal, will get LHC, LHC scheduled,risk and benefit explained and consent obtained  2. PAD: arterial doppler showed both SFA has occlusions and rt CFA occlusion as well, will do lower extremity angiogram also  3. PAIN in neck, back and legs: will give hydrocodone for few days again only until seen by pain clinic  4. LUKEMIA, AS PER ONCOLOGIST  5. HTN: controlled,??continue ??Lisinopril  6. DM: CONTINUE insulin  7. Dyslipidemia; stable, continue statins      All labs, medications and tests reviewed, continue all other medications of all above medical condition listed as is.    @SIGNATURE @

## 2017-07-25 ENCOUNTER — Encounter: Attending: Emergency Medicine | Primary: Family

## 2017-07-31 ENCOUNTER — Encounter

## 2017-07-31 ENCOUNTER — Inpatient Hospital Stay: Admit: 2017-07-31 | Payer: PRIVATE HEALTH INSURANCE | Primary: Family

## 2017-07-31 ENCOUNTER — Inpatient Hospital Stay: Payer: PRIVATE HEALTH INSURANCE | Primary: Family

## 2017-07-31 DIAGNOSIS — Z01818 Encounter for other preprocedural examination: Secondary | ICD-10-CM

## 2017-07-31 LAB — CBC WITH AUTO DIFFERENTIAL
Basophils %: 3 % — ABNORMAL HIGH (ref 0–1)
Basophils Absolute: 0.8 10*3/uL
Eosinophils %: 27 % — ABNORMAL HIGH (ref 0–3)
Eosinophils Absolute: 6.9 10*3/uL
Hematocrit: 37.9 % — ABNORMAL LOW (ref 42–52)
Hemoglobin: 11.9 GM/DL — ABNORMAL LOW (ref 13.5–18.0)
Lymphocytes %: 50 % — ABNORMAL HIGH (ref 24–44)
Lymphocytes Absolute: 12.7 10*3/uL
MCH: 27.2 PG (ref 27–31)
MCHC: 31.4 % — ABNORMAL LOW (ref 32.0–36.0)
MCV: 86.7 FL (ref 78–100)
MPV: 10.7 FL (ref 7.5–11.1)
Monocytes %: 2 % (ref 0–4)
Monocytes Absolute: 0.5 10*3/uL
Platelets: 329 10*3/uL (ref 140–440)
RBC: 4.37 10*6/uL — ABNORMAL LOW (ref 4.6–6.2)
RDW: 16.8 % — ABNORMAL HIGH (ref 11.7–14.9)
Segs Absolute: 4.6 10*3/uL
Segs Relative: 18 % — ABNORMAL LOW (ref 36–66)
WBC: 25.5 10*3/uL — ABNORMAL HIGH (ref 4.0–10.5)

## 2017-07-31 LAB — TYPE AND SCREEN
ABO/Rh: B POS
Antibody Screen: NEGATIVE

## 2017-07-31 LAB — PROTIME/INR & PTT
INR: 1.12 INDEX
Protime: 12.7 SECONDS — ABNORMAL HIGH (ref 9.12–12.5)
aPTT: 30.5 SECONDS (ref 21.2–33.0)

## 2017-07-31 LAB — BASIC METABOLIC PANEL
Anion Gap: 14 (ref 4–16)
BUN: 11 MG/DL (ref 6–23)
CO2: 24 MMOL/L (ref 21–32)
Calcium: 8.9 MG/DL (ref 8.3–10.6)
Chloride: 101 mMol/L (ref 99–110)
Creatinine: 1 MG/DL (ref 0.9–1.3)
GFR African American: >60 mL/min/{1.73_m2} (ref >60)
GFR Non-African American: >60 mL/min/{1.73_m2} (ref >60)
Glucose: 99 MG/DL (ref 70–99)
Potassium: 5 MMOL/L (ref 3.5–5.1)
Sodium: 139 MMOL/L (ref 135–145)

## 2017-08-01 NOTE — Telephone Encounter (Signed)
Reminded pt of procedure and instructions. Pt has HX of high WBC. Dr. Luanne Bras was notified.

## 2017-08-02 ENCOUNTER — Inpatient Hospital Stay: Admit: 2017-08-02 | Payer: PRIVATE HEALTH INSURANCE | Attending: Emergency Medicine | Primary: Family

## 2017-08-02 DIAGNOSIS — I2510 Atherosclerotic heart disease of native coronary artery without angina pectoris: Secondary | ICD-10-CM

## 2017-08-02 LAB — POCT GLUCOSE: POC Glucose: 119 MG/DL — ABNORMAL HIGH (ref 70–99)

## 2017-08-02 MED ORDER — VERAPAMIL HCL 2.5 MG/ML IV SOLN
2.5 | INTRAVENOUS | Status: AC
Start: 2017-08-02 — End: 2017-08-02

## 2017-08-02 MED ORDER — IODIXANOL 320 MG/ML IV SOLN
320 | INTRAVENOUS | Status: AC
Start: 2017-08-02 — End: 2017-08-02

## 2017-08-02 MED ORDER — IOPAMIDOL 76 % IV SOLN
76 | INTRAVENOUS | Status: AC
Start: 2017-08-02 — End: 2017-08-02

## 2017-08-02 MED ORDER — SODIUM CHLORIDE 0.9 % IV SOLN
0.9 % | INTRAVENOUS | Status: DC
Start: 2017-08-02 — End: 2017-08-02
  Administered 2017-08-02: 13:00:00 via INTRAVENOUS

## 2017-08-02 MED ORDER — LIDOCAINE HCL (CARDIAC) PF 100 MG/5ML IV SOLN
100 | INTRAVENOUS | Status: AC
Start: 2017-08-02 — End: 2017-08-02

## 2017-08-02 MED ORDER — DIPHENHYDRAMINE HCL 25 MG PO TABS
25 MG | Freq: Once | ORAL | Status: AC
Start: 2017-08-02 — End: 2017-08-02
  Administered 2017-08-02: 13:00:00 25 mg via ORAL

## 2017-08-02 MED ORDER — HEPARIN SODIUM (PORCINE) 5000 UNIT/ML IJ SOLN
5000 | INTRAMUSCULAR | Status: AC
Start: 2017-08-02 — End: 2017-08-02

## 2017-08-02 MED ORDER — DIAZEPAM 5 MG PO TABS
5 MG | Freq: Once | ORAL | Status: AC
Start: 2017-08-02 — End: 2017-08-02
  Administered 2017-08-02: 13:00:00 5 mg via ORAL

## 2017-08-02 MED ORDER — HEPARIN (PORCINE) IN NACL 1000-0.9 UT/500ML-% IV SOLN
1000-0.9 | INTRAVENOUS | Status: AC
Start: 2017-08-02 — End: 2017-08-02

## 2017-08-02 MED FILL — HEPARIN (PORCINE) IN NACL 1000-0.9 UT/500ML-% IV SOLN: 1000-0.9 UT/500ML-% | INTRAVENOUS | Qty: 1500

## 2017-08-02 MED FILL — HEPARIN SODIUM (PORCINE) 5000 UNIT/ML IJ SOLN: 5000 [IU]/mL | INTRAMUSCULAR | Qty: 1

## 2017-08-02 MED FILL — BANOPHEN 25 MG PO TABS: 25 mg | ORAL | Qty: 1

## 2017-08-02 MED FILL — ISOVUE-370 76 % IV SOLN: 76 % | INTRAVENOUS | Qty: 100

## 2017-08-02 MED FILL — DIAZEPAM 5 MG PO TABS: 5 mg | ORAL | Qty: 1

## 2017-08-02 MED FILL — VISIPAQUE 320 MG/ML IV SOLN: 320 mg/mL | INTRAVENOUS | Qty: 100

## 2017-08-02 MED FILL — VERAPAMIL HCL 2.5 MG/ML IV SOLN: 2.5 mg/mL | INTRAVENOUS | Qty: 2

## 2017-08-02 MED FILL — LIDOCAINE HCL (CARDIAC) PF 100 MG/5ML IV SOLN: 100 MG/5ML | INTRAVENOUS | Qty: 5

## 2017-08-02 MED FILL — VISIPAQUE 320 MG/ML IV SOLN: 320 mg/mL | INTRAVENOUS | Qty: 50

## 2017-08-02 NOTE — Progress Notes (Signed)
Discharge instructions discussed and questions answered; pt denies complaints. Clothing changed and belongings returned and secured. IV discontinued and DSD applied.  Pt transferred to front entrance via wheelchair to private auto per volunteer.

## 2017-08-02 NOTE — Progress Notes (Signed)
Received from cath lab.  Monitor and alarms on. Call Light in reach.

## 2017-08-02 NOTE — Progress Notes (Signed)
To holding area.  Assessment completed and questions answered. Call light in reach.

## 2017-08-02 NOTE — H&P (Signed)
Chief complaints: patient is here for management of chest pain,??DM, HTN, ??DYSLPIDEMIA, PAD, lukemia  ??  ??  ??  Subjective: leg pain and chest pain  ??  HPI Ricardo Foley is a 57 y.o.year old who  has a past medical history of CAD (coronary artery disease), Diabetes ([place]), H/O cardiovascular stress test, H/O Doppler lower arterial ultrasound, H/O echocardiogram, Hyperlipidemia, Hypertension, Nerve pain, PVD (peripheral vascular disease) ([place]), and Scoliosis. and presents for management of chest pain,??DM, HTN, ??DYSLPIDEMIA, PAD, lukemia which are well controlled  ??  Patient has abnormal stress test and abnormal lower extremity doppler  Current??Facility-Administered??Medications          Current Outpatient Medications   Medication Sig Dispense Refill   ??? tiZANidine HCl (ZANAFLEX PO) Take by mouth ?? ??   ??? Famotidine (PEPCID PO) Take by mouth ?? ??   ??? Docusate Sodium (COLACE PO) Take by mouth ?? ??   ??? lisinopril (PRINIVIL;ZESTRIL) 20 MG tablet Take 20 mg by mouth daily ?? ??   ??? ondansetron (ZOFRAN ODT) 4 MG disintegrating tablet Take 1 tablet by mouth every 8 hours as needed for Nausea or Vomiting 20 tablet 0   ??? aspirin 81 MG chewable tablet Take 1 tablet by mouth daily 30 tablet 3   ??? dicyclomine (BENTYL) 10 MG capsule Take 1 capsule by mouth 4 times daily (before meals and nightly) 30 capsule 0   ??? pantoprazole sodium (PROTONIX) 40 MG PACK packet Take 1 packet by mouth every morning (before breakfast) 30 each 3   ??? metFORMIN (GLUCOPHAGE) 1000 MG tablet Take 1,000 mg by mouth 2 times daily (with meals) ?? ??   ??? hydrOXYzine (VISTARIL) 50 MG capsule Take 50 mg by mouth 3 times daily as needed for Itching ?? ??   ??? atorvastatin (LIPITOR) 20 MG tablet Take 20 mg by mouth daily ?? ??   ??? DULoxetine (CYMBALTA) 30 MG extended release capsule Take 1 capsule by mouth daily 30 capsule 0   ??? gabapentin (NEURONTIN) 300 MG capsule Take 1 capsule by mouth 3 times daily for 10 days.. 30 capsule 0   ??  No current facility-administered medications for  this visit.       ??  Allergies: Patient has no known allergies.  Past??Medical??History        Past Medical History:   Diagnosis Date   ??? CAD (coronary artery disease) ??   ??? Diabetes ([place]) ??   ??? H/O cardiovascular stress test 07/03/2017   ?? scan shows moderate size, severe intensity, non reversible perfusion defect in inferolateral wall, normal LVEF.   ??? H/O Doppler lower arterial ultrasound 07/11/2017   ?? The Right Proximal and mid SFA exhibits an occlusion. The Left SFA exhibits an occlusion between the mid and distal portions. Right ABIs show Severe peripheral arterial disease.   ??? H/O echocardiogram 05/30/2017   ?? Left ventricular systolic function is normal. Ejection fraction is visually estimated at >55%.   ??? Hyperlipidemia ??   ??? Hypertension ??   ??? Nerve pain ??   ??? PVD (peripheral vascular disease) ([place]) 07/11/2017   ?? See ABN Arterial US.   ??? Scoliosis ??      ??  Past??Surgical??History         Past Surgical History:   Procedure Laterality Date   ??? PR SONO GUIDE NEEDLE BIOPSY ?? ??      ??  Family??History         Family History   Problem Relation Age of Onset   ???  Stroke Sister ??   ??? Heart Attack Brother ??      ??  Social History   ??        Tobacco Use   ??? Smoking status: Current Every Day Smoker   ?? ?? Packs/day: 1.50   ?? ?? Types: Cigarettes   ??? Smokeless tobacco: Never Used   Substance Use Topics   ??? Alcohol use: No      @ROSAKH @  Review of Systems:   ?? Constitutional: No Fever or Weight Loss   ?? Eyes: No Decreased Vision  ?? ENT: No Headaches, Hearing Loss or Vertigo  ?? Cardiovascular: No chest pain, dyspnea on exertion, palpitations or loss of consciousness  ?? Respiratory: No cough or wheezing    ?? Gastrointestinal: No abdominal pain, appetite loss, blood in stools, constipation, diarrhea or heartburn  ?? Genitourinary: No dysuria, trouble voiding, or hematuria  ?? Musculoskeletal:  No gait disturbance, weakness or joint complaints  ?? Integumentary: No rash or pruritis  ?? Neurological: No TIA or stroke  symptoms  ?? Psychiatric: No anxiety or depression  ?? Endocrine: No malaise, fatigue or temperature intolerance  ?? Hematologic/Lymphatic: No bleeding problems, blood clots or swollen lymph nodes  ?? Allergic/Immunologic: No nasal congestion or hives  All systems negative except as marked.   Objective:  BP (!) 98/56    Pulse 68    Resp 18    Wt 182 lb (82.6 kg)    BMI 29.38 kg/m??       Wt Readings from Last 3 Encounters:   07/19/17 182 lb (82.6 kg)   07/09/17 183 lb (83 kg)   06/22/17 180 lb (81.6 kg)   ??  Body mass index is 29.38 kg/m??.  GENERAL - Alert, oriented, pleasant, in no apparent distress,normal grooming  HEENT - pupils are reactive to light and accomodation, cornea intact, conjunctive normal color, ears are normal in exam,throat exam in normal, teeth, gum and palate are normal, oral mucosa is normal without any notation of pallor or cyanosis  Neck - Supple.  No jugular venous distention noted. No carotid bruits, no apical -carotid delay  Respiratory:  Normal breath sounds, No respiratory distress, No wheezing, No chest tenderness.,no use of accessory muscles, diaphragm movement is normal  Cardiovascular: (PMI) apex non displaced,no lifts no thrills, no s3,no s4, Normal heart rate, Normal rhythm, No murmurs, No rubs, No gallops. Carotid arteries pulse and amplitude are normal no bruit, no abdominal bruit noted ( normal abdominal aorta ausculation), femoral arteries pulse and amplitude are normal no bruit, pedal pulses are normal  Femoral pulses have normal amplitude, no bruits   Extremities - No cyanosis, clubbing, or significant edema, no varicose veins    Abdomen - No masses, tenderness, or organomegaly, no hepato-splenomegally, no bruits  Musculoskeletal - No significant edema, no kyphosis or scoliosis, no deformity in any extremity noted, muscle strength and tone are normal  Skin: no ulcer,no scar,no stasis dermatitis   Neurologic - alert oriented times 3,Cranial nerves II through XII are grossly intact.   There were no gross focal neurologic abnormalities. All sensory and motor nerves examined and were normal  Psychiatric: normal mood and affect  ??  No results found for: CKTOTAL, CKMB, CKMBINDEX, TROPONINI  BNP:  No results found for: BNP  PT/INR:  No results found for: PTINR        Lab Results   Component Value Date   ?? LABA1C 7.2 (H) 05/31/2017   ??  Lab Results   Component Value Date   ?? CHOL 115 03/12/2017   ?? TRIG 186 (H) 03/12/2017   ?? HDL 43 03/12/2017   ??        Lab Results   Component Value Date   ?? ALT 28 06/11/2017   ?? AST 15 06/11/2017   ??  TSH:          Lab Results   Component Value Date   ?? TSH 1.42 03/12/2017   ??  ??  Impression:  Argyle is a 57 y.o.year old who  has a past medical history of CAD (coronary artery disease), Diabetes ([place]), H/O cardiovascular stress test, H/O Doppler lower arterial ultrasound, H/O echocardiogram, Hyperlipidemia, Hypertension, Nerve pain, PVD (peripheral vascular disease) ([place]), and Scoliosis. and presents with   ??  Plan:  1. Chest pain:stress test??is abnromal, will get LHC,??LHC scheduled,risk and benefit explained and consent obtained  2. PAD: arterial doppler showed both SFA has occlusions and rt CFA occlusion as well, will do lower extremity angiogram also  3. PAIN in neck, back and legs: will give hydrocodone for few days again only until seen by pain clinic  4. LUKEMIA, AS PER ONCOLOGIST  5. HTN: controlled,??continue ??Lisinopril  6. DM: CONTINUE insulin  7. Dyslipidemia; stable, continue statins  ??  ??  All labs, medications and tests reviewed, continue all other medications of all above medical condition

## 2017-08-02 NOTE — Discharge Instructions (Addendum)
Home instruction for Radial site Heart Catheterization:    Do not lift with affected arm for 3 days. Avoid lifting >10lbs. No flexing or bending affected wrist.    Remove dressing the next day, keep area clean and dry and covered with a new band aid daily until healed.    Do not submerge site in water for 3 days.    Slight tenderness is normal, but notify doctor if tingling of fingers/hand happens.    If bleeding or hematoma (blood collecting under the skin) occurs at site, apply pressure to slow the bleeding and notify doctor immediately.    No driving for 3 days.    For your procedure you may have been given a sedative to help you relax. This drug will make you sleepy. It is usually given in a vein (by IV).   Don't do anything for 24 hours that requires attention to detail. It takes time for the medicine effects to completely wear off.   Do not sign any legally binding contracts or documents over the next 24 hours.   For your safety, you should not drive or operate any machinery that could be dangerous until the medicine wears off and you can think clearly and react easily.

## 2017-08-27 ENCOUNTER — Inpatient Hospital Stay
Admit: 2017-08-27 | Discharge: 2017-08-27 | Disposition: A | Discharge status: Home / Self Care | Payer: PRIVATE HEALTH INSURANCE | Attending: Emergency Medicine

## 2017-08-27 ENCOUNTER — Emergency Department: Admit: 2017-08-27 | Payer: PRIVATE HEALTH INSURANCE | Primary: Family

## 2017-08-27 DIAGNOSIS — M7989 Other specified soft tissue disorders: Secondary | ICD-10-CM

## 2017-08-27 LAB — URINALYSIS WITH MICROSCOPIC
Bacteria, UA: NEGATIVE /HPF
Bilirubin Urine: NEGATIVE MG/DL
Blood, Urine: NEGATIVE
Glucose, Urine: NEGATIVE MG/DL
Hyaline Casts, UA: 2 /LPF
Ketones, Urine: NEGATIVE MG/DL
Leukocyte Esterase, Urine: NEGATIVE
Nitrite Urine, Quantitative: NEGATIVE
Protein, UA: NEGATIVE MG/DL
RBC, UA: NONE SEEN /HPF (ref 0–3)
Specific Gravity, UA: 1.01 (ref 1.001–1.035)
Trans Epithel, UA: <1 /HPF
Trichomonas: NONE SEEN /[HPF]
Urobilinogen, Urine: NORMAL MG/DL (ref 0.2–1.0)
WBC, UA: <1 /HPF (ref 0–2)
pH, Urine: 5 (ref 5.0–8.0)

## 2017-08-27 LAB — CBC WITH AUTO DIFFERENTIAL
Eosinophils %: 12 % — ABNORMAL HIGH (ref 0–3)
Eosinophils Absolute: 1.8 10*3/uL
Hematocrit: 35.6 % — ABNORMAL LOW (ref 42–52)
Hemoglobin: 11.1 GM/DL — ABNORMAL LOW (ref 13.5–18.0)
Lymphocytes %: 49 % — ABNORMAL HIGH (ref 24–44)
Lymphocytes Absolute: 7.5 10*3/uL
MCH: 27.6 PG (ref 27–31)
MCHC: 31.2 % — ABNORMAL LOW (ref 32.0–36.0)
MCV: 88.6 FL (ref 78–100)
MPV: 10.1 FL (ref 7.5–11.1)
Monocytes %: 2 % (ref 0–4)
Monocytes Absolute: 0.3 10*3/uL
Neutrophils Absolute: 5.7 10*3/uL
Platelets: 349 10*3/uL (ref 140–440)
RBC: 4.02 10*6/uL — ABNORMAL LOW (ref 4.6–6.2)
RDW: 16.9 % — ABNORMAL HIGH (ref 11.7–14.9)
Segs Relative: 37 % (ref 36–66)
WBC: 15.3 10*3/uL — ABNORMAL HIGH (ref 4.0–10.5)

## 2017-08-27 LAB — COMPREHENSIVE METABOLIC PANEL
ALT: 12 U/L (ref 10–40)
AST: 16 IU/L (ref 15–37)
Albumin: 3.9 GM/DL (ref 3.4–5.0)
Alkaline Phosphatase: 79 IU/L (ref 40–129)
Anion Gap: 9 (ref 4–16)
BUN: 12 MG/DL (ref 6–23)
CO2: 28 MMOL/L (ref 21–32)
Calcium: 9 MG/DL (ref 8.3–10.6)
Chloride: 103 mMol/L (ref 99–110)
Creatinine: 1.1 MG/DL (ref 0.9–1.3)
GFR African American: >60 mL/min/{1.73_m2} (ref >60)
GFR Non-African American: >60 mL/min/{1.73_m2} (ref >60)
Glucose: 136 MG/DL — ABNORMAL HIGH (ref 70–99)
Potassium: 4.5 MMOL/L (ref 3.5–5.1)
Sodium: 140 MMOL/L (ref 135–145)
Total Bilirubin: 0.2 MG/DL (ref 0.0–1.0)
Total Protein: 6.1 GM/DL — ABNORMAL LOW (ref 6.4–8.2)

## 2017-08-27 MED ORDER — OXYCODONE-ACETAMINOPHEN 5-325 MG PO TABS
5-325 MG | Freq: Once | ORAL | Status: AC
Start: 2017-08-27 — End: 2017-08-27
  Administered 2017-08-27: 20:00:00 2 via ORAL

## 2017-08-27 MED FILL — OXYCODONE-ACETAMINOPHEN 5-325 MG PO TABS: 5-325 mg | ORAL | Qty: 2

## 2017-08-27 NOTE — ED Notes (Signed)
Discharge instructions reviewed with pt and questions addressed at this time. Pt alert and oriented x 4 at time of discharge, no signs of acute distress noted. Pt ambulatory to Emergency Department waiting room and steady gait noted.        Lamona Curl, RN  08/27/17 [phone]

## 2017-08-27 NOTE — ED Triage Notes (Signed)
Pt walked to ED with complaint bilateral leg swelling. Pt is supposed to have stents placed in bilateral legs at beginning of next month.

## 2017-08-27 NOTE — ED Notes (Signed)
[name], lactic and venous ph drawn on patient.     Ricardo Foley  08/27/17 1316

## 2017-08-27 NOTE — ED Provider Notes (Signed)
Emergency Department Encounter    Patient: Ricardo Foley  MRN: [id number]  DOB: [birth year]  Date of Evaluation: 08/27/2017  ED Provider:  Dellie Burns    Triage Chief Complaint:   Leg Swelling    HOPI:  Ricardo Foley is a 57 y.o. male that presents with leg swelling, gradual in onset over the last few days.  He does have a history of peripheral arterial disease and is seeing a vascular surgeon and scheduled for an outpatient procedure he call them today that his ankles were swollen they told him to come to the ER.  He has chronic pain slightly worse acutely but not drastically.  Pain is sharp/tingling, moderate and constant nothing makes it significantly better or worse he has pain medications at home but did not take them.  No changes to skin color.  No numbness or weakness.    ROS:  General:  No fevers, no chills  Eyes:  No recent vison changes  ENT:  No sore throat, no nasal congestion  Cardiovascular:  No chest pain, no palpitations  Respiratory:  No shortness of breath, no cough  Gastrointestinal:  No pain, no nausea, no vomiting  Musculoskeletal:  No muscle pain, no joint pain  Skin:  No rash, no pruritis, no easy bruising  Neurologic:  No speech problems, no headache, no extremity numbness, + extremity tingling, no extremity weakness  Endocrine:  No unexpected weight gain, no unexpected weight loss  Extremities:  + edema, + pain    Past Medical History:   Diagnosis Date   ??? CAD (coronary artery disease)    ??? Diabetes ([place])    ??? H/O cardiac catheterization 08/02/2017     severe single vessel CAD of the distal LCX which is small caliber and not amenable to PCI.   ??? H/O cardiovascular stress test 07/03/2017    scan shows moderate size, severe intensity, non reversible perfusion defect in inferolateral wall, normal LVEF.   ??? H/O Doppler lower arterial ultrasound 07/11/2017    The Right Proximal and mid SFA exhibits an occlusion. The Left SFA exhibits an occlusion between the mid and distal portions. Right ABIs  show Severe peripheral arterial disease.   ??? H/O echocardiogram 05/30/2017    Left ventricular systolic function is normal. Ejection fraction is visually estimated at >55%.   ??? Hyperlipidemia    ??? Hypertension    ??? Nerve pain    ??? PVD (peripheral vascular disease) ([place]) 08/02/2017     Severe lower extremity PAD. See abn Peripherial Angio. ( ABN Arterial US.07/11/17)   ??? Scoliosis      Past Surgical History:   Procedure Laterality Date   ??? PR SONO GUIDE NEEDLE BIOPSY       Family History   Problem Relation Age of Onset   ??? Stroke Sister    ??? Heart Attack Brother      Social History     Socioeconomic History   ??? Marital status: Single     Spouse name: Not on file   ??? Number of children: Not on file   ??? Years of education: Not on file   ??? Highest education level: Not on file   Occupational History   ??? Not on file   Social Needs   ??? Financial resource strain: Not on file   ??? Food insecurity:     Worry: Not on file     Inability: Not on file   ??? Transportation needs:     Medical: Not on file  Non-medical: Not on file   Tobacco Use   ??? Smoking status: Current Every Day Smoker     Packs/day: 1.50     Types: Cigarettes   ??? Smokeless tobacco: Never Used   Substance and Sexual Activity   ??? Alcohol use: No   ??? Drug use: Yes     Frequency: 3.0 times per week     Types: Marijuana   ??? Sexual activity: Not on file   Lifestyle   ??? Physical activity:     Days per week: Not on file     Minutes per session: Not on file   ??? Stress: Not on file   Relationships   ??? Social connections:     Talks on phone: Not on file     Gets together: Not on file     Attends religious service: Not on file     Active member of club or organization: Not on file     Attends meetings of clubs or organizations: Not on file     Relationship status: Not on file   ??? Intimate partner violence:     Fear of current or ex partner: Not on file     Emotionally abused: Not on file     Physically abused: Not on file     Forced sexual activity: Not on file   Other  Topics Concern   ??? Not on file   Social History Narrative   ??? Not on file     No current facility-administered medications for this encounter.      Current Outpatient Medications   Medication Sig Dispense Refill   ??? tiZANidine HCl (ZANAFLEX PO) Take by mouth     ??? Famotidine (PEPCID PO) Take by mouth     ??? Docusate Sodium (COLACE PO) Take by mouth     ??? lisinopril (PRINIVIL;ZESTRIL) 20 MG tablet Take 20 mg by mouth daily     ??? ondansetron (ZOFRAN ODT) 4 MG disintegrating tablet Take 1 tablet by mouth every 8 hours as needed for Nausea or Vomiting 20 tablet 0   ??? aspirin 81 MG chewable tablet Take 1 tablet by mouth daily 30 tablet 3   ??? dicyclomine (BENTYL) 10 MG capsule Take 1 capsule by mouth 4 times daily (before meals and nightly) 30 capsule 0   ??? pantoprazole sodium (PROTONIX) 40 MG PACK packet Take 1 packet by mouth every morning (before breakfast) 30 each 3   ??? metFORMIN (GLUCOPHAGE) 1000 MG tablet Take 1,000 mg by mouth 2 times daily (with meals)     ??? hydrOXYzine (VISTARIL) 50 MG capsule Take 50 mg by mouth 3 times daily as needed for Itching     ??? atorvastatin (LIPITOR) 20 MG tablet Take 20 mg by mouth daily     ??? DULoxetine (CYMBALTA) 30 MG extended release capsule Take 1 capsule by mouth daily 30 capsule 0   ??? gabapentin (NEURONTIN) 300 MG capsule Take 1 capsule by mouth 3 times daily for 10 days.. 30 capsule 0     No Known Allergies    Nursing Notes Reviewed    Physical Exam:  Triage VS:    ED Triage Vitals [08/27/17 1323]   Enc Vitals Group      BP 109/66      Pulse 89      Resp 16      Temp 98.6 ??F (37 ??C)      Temp Source Oral      SpO2 96 %  Weight 181 lb (82.1 kg)      Height 5\' 6"  (1.676 m)      Head Circumference       Peak Flow       Pain Score       Pain Loc       Pain Edu?       Excl. in [place]?        My pulse ox interpretation is - normal    General appearance:  No acute distress.   Skin:  Warm. Dry.  No cellulitic changes, no skin color changes capillary refill is 3 seconds bilateral lower  extremities and equal, pedal pulses are palpable bilaterally  Eye:  Extraocular movements intact.     Ears, nose, mouth and throat:  Oral mucosa moist   Neck:  Trachea midline.   Extremity:  Trace LE pitting edema BL.  Normal ROM     Heart:  Regular  Perfusion:  Cap refill apprx 3 sec equal BL, pedal pulses are palpable to DP bilateral  Respiratory:   Respirations nonlabored.     Abdominal:   Non distended.  Neurological:  Alert and oriented times 3.  Motor and sensory exam intact and equal to bilateral lower extremities    I have reviewed and interpreted all of the currently available lab results from this visit (if applicable):  Results for orders placed or performed during the hospital encounter of 08/27/17   Comprehensive Metabolic Panel   Result Value Ref Range    Sodium 140 135 - 145 MMOL/L    Potassium 4.5 3.5 - 5.1 MMOL/L    Chloride 103 99 - 110 mMol/L    CO2 28 21 - 32 MMOL/L    BUN 12 6 - 23 MG/DL    CREATININE 1.1 0.9 - 1.3 MG/DL    Glucose 136 (H) 70 - 99 MG/DL    Calcium 9.0 8.3 - 10.6 MG/DL    Alb 3.9 3.4 - 5.0 GM/DL    Total Protein 6.1 (L) 6.4 - 8.2 GM/DL    Total Bilirubin 0.2 0.0 - 1.0 MG/DL    ALT 12 10 - 40 U/L    AST 16 15 - 37 IU/L    Alkaline Phosphatase 79 40 - 129 IU/L    GFR Non-African American >60 >60 mL/min/1.51m2    GFR African American >60 >60 mL/min/1.67m2    Anion Gap 9 4 - 16   CBC Auto Differential   Result Value Ref Range    WBC 15.3 (H) 4.0 - 10.5 K/CU MM    RBC 4.02 (L) 4.6 - 6.2 M/CU MM    Hemoglobin 11.1 (L) 13.5 - 18.0 GM/DL    Hematocrit 35.6 (L) 42 - 52 %    MCV 88.6 78 - 100 FL    MCH 27.6 27 - 31 PG    MCHC 31.2 (L) 32.0 - 36.0 %    RDW 16.9 (H) 11.7 - 14.9 %    Platelets 349 140 - 440 K/CU MM    MPV 10.1 7.5 - 11.1 FL    Segs Relative 37.0 36 - 66 %    Eosinophils % 12.0 (H) 0 - 3 %    Lymphocytes % 49.0 (H) 24 - 44 %    Monocytes % 2.0 0 - 4 %    Segs Absolute 5.7 K/CU MM    Eosinophils # 1.8 K/CU MM    Lymphocytes # 7.5 K/CU MM    Monocytes # 0.3 K/CU MM  Differential Type MANUAL DIFFERENTIAL     Atypical Lymphocytes Absolute 1+    Urinalysis with Microscopic   Result Value Ref Range    Color, UA YELLOW YELLOW    Clarity, UA CLEAR CLEAR    Glucose, Urine NEGATIVE NEGATIVE MG/DL    Bilirubin Urine NEGATIVE NEGATIVE MG/DL    Ketones, Urine NEGATIVE NEGATIVE MG/DL    Specific Gravity, UA 1.010 1.001 - 1.035    Blood, Urine NEGATIVE NEGATIVE    pH, Urine 5.0 5.0 - 8.0    Protein, UA NEGATIVE NEGATIVE MG/DL    Urobilinogen, Urine NORMAL 0.2 - 1.0 MG/DL    Nitrite Urine, Quantitative NEGATIVE NEGATIVE    Leukocyte Esterase, Urine NEGATIVE NEGATIVE    RBC, UA NONE SEEN 0 - 3 /HPF    WBC, UA <1 0 - 2 /HPF    Bacteria, UA NEGATIVE NEGATIVE /HPF    Trans Epithel, UA <1 /HPF    Sperm, UA RARE /HFP    Trichomonas, UA NONE SEEN NONE SEEN /HPF    Hyaline Casts, UA 2 /LPF      Radiographs (if obtained):    []  Radiologist's Report Reviewed:  VL DUP LOWER EXTREMITY VENOUS BILATERAL   Final Result   No evidence of DVT in either lower extremity.               EKG (if obtained): (All EKG's are interpreted by myself in the absence of a cardiologist)    Chart review shows recent radiographs:  Xr Chest Standard (2 Vw)    Result Date: 07/31/2017  EXAMINATION: TWO XRAY VIEWS OF THE CHEST 07/31/2017 11:32 am COMPARISON: 05/30/2017 HISTORY: ORDERING SYSTEM PROVIDED HISTORY: Pre-op testing TECHNOLOGIST PROVIDED HISTORY: Reason for Exam: pre op testing, pt reports chest pain and sob Initial encounter FINDINGS: No focal consolidation, pleural effusion or pneumothorax.  The cardiomediastinal silhouette is stable.  No overt pulmonary edema.  The osseous structures are stable.     No acute cardiopulmonary findings.     Vl Dup Lower Extremity Venous Bilateral    Result Date: 08/27/2017  EXAMINATION: DUPLEX VENOUS ULTRASOUND OF THE BILATERAL LOWER EXTREMITIES, 08/27/2017 3:10 pm TECHNIQUE: Duplex ultrasound and Doppler images were obtained of the bilateral lower extremities COMPARISON: None. HISTORY: ORDERING  SYSTEM PROVIDED HISTORY: swelling, pain TECHNOLOGIST PROVIDED HISTORY: Reason for exam:->swelling, pain Reason for Exam: bilateral leg pain, swelling Type of Exam: Initial FINDINGS: The visualized veins of the bilateral lower extremities are patent and free of echogenic thrombus. The veins are normally compressible and have normal phasic flow.     No evidence of DVT in either lower extremity.       MDM:  Patient here with lower extremity swelling, it is trace, no evidence of infection, compartment syndrome, DVT, vascular compromise or necrotizing infection.  BUN and creatinine not elevated, slight leukocytosis noted but better than previous.  Ultrasounds negative for DVT I spoke with vascular surgeon will discharge to follow-up as an outpatient they understand and agree return warnings given.    Clinical Impression:  1. Leg swelling      Disposition referral (if applicable):  Dewayne Hatch, MD  [address]  [place] [id number]  [phone]    In 2 days      Disposition medications (if applicable):  New Prescriptions    No medications on file       Comment: Please note this report has been produced using speech recognition software and may contain errors related to that  system including errors in grammar, punctuation, and spelling, as well as words and phrases that may be inappropriate.  Efforts were made to edit the dictations.       Noe Gens, MD  08/27/17 [phone]

## 2017-08-29 NOTE — Telephone Encounter (Signed)
error 

## 2017-09-07 ENCOUNTER — Encounter: Attending: Emergency Medicine | Primary: Family

## 2017-09-08 ENCOUNTER — Emergency Department: Admit: 2017-09-08 | Payer: PRIVATE HEALTH INSURANCE | Primary: Family

## 2017-09-08 ENCOUNTER — Inpatient Hospital Stay
Admission: EM | Admit: 2017-09-08 | Discharge: 2017-09-09 | Disposition: A | Discharge status: Home / Self Care | Payer: PRIVATE HEALTH INSURANCE | Source: Other Acute Inpatient Hospital | Admitting: Internal Medicine

## 2017-09-08 DIAGNOSIS — N179 Acute kidney failure, unspecified: Principal | ICD-10-CM

## 2017-09-08 LAB — CBC WITH AUTO DIFFERENTIAL
Basophils %: 0.2 % (ref 0–1)
Basophils Absolute: 0.1 10*3/uL
Eosinophils %: 0 % (ref 0–3)
Eosinophils Absolute: 0 10*3/uL
Hematocrit: 39.6 % — ABNORMAL LOW (ref 42–52)
Hemoglobin: 12.5 GM/DL — ABNORMAL LOW (ref 13.5–18.0)
Immature Neutrophil %: 0.5 % — ABNORMAL HIGH (ref 0–0.43)
Lymphocytes %: 33.4 % (ref 24–44)
Lymphocytes Absolute: 9.6 10*3/uL
MCH: 27.8 PG (ref 27–31)
MCHC: 31.6 % — ABNORMAL LOW (ref 32.0–36.0)
MCV: 88 FL (ref 78–100)
MPV: 9.6 FL (ref 7.5–11.1)
Monocytes %: 6.7 % — ABNORMAL HIGH (ref 0–4)
Monocytes Absolute: 1.9 10*3/uL
Nucleated RBC %: 0 %
Platelets: 508 10*3/uL — ABNORMAL HIGH (ref 140–440)
RBC: 4.5 10*6/uL — ABNORMAL LOW (ref 4.6–6.2)
RDW: 16.5 % — ABNORMAL HIGH (ref 11.7–14.9)
Segs Absolute: 17.1 10*3/uL
Segs Relative: 59.2 % (ref 36–66)
Total Immature Neutrophil: 0.15 10*3/uL
Total Nucleated RBC: 0 10*3/uL
WBC: 28.8 10*3/uL — ABNORMAL HIGH (ref 4.0–10.5)

## 2017-09-08 LAB — COMPREHENSIVE METABOLIC PANEL
ALT: 13 U/L (ref 10–40)
AST: 10 IU/L — ABNORMAL LOW (ref 15–37)
Albumin: 5 GM/DL (ref 3.4–5.0)
Alkaline Phosphatase: 70 IU/L (ref 40–129)
Anion Gap: 14 (ref 4–16)
BUN: 46 MG/DL — ABNORMAL HIGH (ref 6–23)
CO2: 24 MMOL/L (ref 21–32)
Calcium: 9.8 MG/DL (ref 8.3–10.6)
Chloride: 96 mMol/L — ABNORMAL LOW (ref 99–110)
Creatinine: 2.4 MG/DL — ABNORMAL HIGH (ref 0.9–1.3)
GFR African American: 34 mL/min/{1.73_m2} — ABNORMAL LOW (ref >60)
GFR Non-African American: 28 mL/min/{1.73_m2} — ABNORMAL LOW (ref >60)
Glucose: 313 MG/DL — ABNORMAL HIGH (ref 70–99)
Potassium: 4.6 MMOL/L (ref 3.5–5.1)
Sodium: 134 MMOL/L — ABNORMAL LOW (ref 135–145)
Total Bilirubin: 0.3 MG/DL (ref 0.0–1.0)
Total Protein: 8 GM/DL (ref 6.4–8.2)

## 2017-09-08 LAB — LIPASE: Lipase: 195 IU/L — ABNORMAL HIGH (ref 13–60)

## 2017-09-08 LAB — CK: Total CK: 58 IU/L (ref 38–174)

## 2017-09-08 LAB — LACTIC ACID: Lactate: 1.8 mMOL/L (ref 0.4–2.0)

## 2017-09-08 LAB — BRAIN NATRIURETIC PEPTIDE: Pro-BNP: 396.5 PG/ML — ABNORMAL HIGH (ref <300)

## 2017-09-08 LAB — MAGNESIUM: Magnesium: 1.8 mg/dl (ref 1.8–2.4)

## 2017-09-08 LAB — TROPONIN: Troponin T: <0.01 NG/ML (ref <0.01)

## 2017-09-08 MED ORDER — SODIUM CHLORIDE 0.9 % IV BOLUS
0.9 % | Freq: Once | INTRAVENOUS | Status: AC
Start: 2017-09-08 — End: 2017-09-08
  Administered 2017-09-08: 23:00:00 1000 mL via INTRAVENOUS

## 2017-09-08 MED ORDER — ONDANSETRON HCL 4 MG/2ML IJ SOLN
4 MG/2ML | INTRAMUSCULAR | Status: DC | PRN
Start: 2017-09-08 — End: 2017-09-08
  Administered 2017-09-08: 23:00:00 4 mg via INTRAVENOUS

## 2017-09-08 MED ORDER — SODIUM CHLORIDE 0.9 % IV BOLUS
0.9 % | Freq: Once | INTRAVENOUS | Status: AC
Start: 2017-09-08 — End: 2017-09-08
  Administered 2017-09-09: 01:00:00 1000 mL via INTRAVENOUS

## 2017-09-08 MED ORDER — ASPIRIN 81 MG PO CHEW
81 MG | Freq: Once | ORAL | Status: AC
Start: 2017-09-08 — End: 2017-09-08
  Administered 2017-09-08: 23:00:00 324 mg via ORAL

## 2017-09-08 MED ORDER — FAMOTIDINE 20 MG/2ML IV SOLN
20 MG/2ML | Freq: Once | INTRAVENOUS | Status: AC
Start: 2017-09-08 — End: 2017-09-08
  Administered 2017-09-08: 23:00:00 20 mg via INTRAVENOUS

## 2017-09-08 MED ORDER — HYDROCODONE-ACETAMINOPHEN 5-325 MG PO TABS
5-325 MG | Freq: Once | ORAL | Status: AC
Start: 2017-09-08 — End: 2017-09-08
  Administered 2017-09-09: 01:00:00 2 via ORAL

## 2017-09-08 MED ORDER — DICYCLOMINE HCL 10 MG/ML IM SOLN
10 MG/ML | Freq: Once | INTRAMUSCULAR | Status: AC
Start: 2017-09-08 — End: 2017-09-08
  Administered 2017-09-08: 23:00:00 20 mg via INTRAMUSCULAR

## 2017-09-08 MED FILL — SODIUM CHLORIDE 0.9 % IV SOLN: 0.9 % | INTRAVENOUS | Qty: 1000

## 2017-09-08 MED FILL — ONDANSETRON HCL 4 MG/2ML IJ SOLN: 4 MG/2ML | INTRAMUSCULAR | Qty: 2

## 2017-09-08 MED FILL — BENTYL 10 MG/ML IM SOLN: 10 mg/mL | INTRAMUSCULAR | Qty: 2

## 2017-09-08 MED FILL — FAMOTIDINE 20 MG/2ML IV SOLN: 20 MG/2ML | INTRAVENOUS | Qty: 2

## 2017-09-08 MED FILL — ASPIRIN 81 MG PO CHEW: 81 mg | ORAL | Qty: 4

## 2017-09-08 NOTE — H&P (Signed)
HISTORY AND PHYSICAL  (Hospitalist, Internal Medicine)  IDENTIFYING INFORMATION   PATIENT:  Ricardo Foley  MRN:  [id number]  ADMIT DATE: 09/08/2017  TIME OF EVALUATION: 09/08/2017 10:04 PM    CHIEF COMPLAINT   Felt sick  Syncope  Polyuria, uncontrolled DMII    HISTORY OF PRESENT ILLNESS   Ricardo Foley is a 57 y.o. male who felt sick for the past 5 days with abdo pain with emesis associated with polyuria and sugar measurements of 500s developed syncope this afternoon. He was out by the porch and when he got up, he described felling flushed from head to toe and passout. He was caught by his wife and brother in law who prevented him from falling to the ground. In the ED, he was found to be in AKI and with low BP in the 90s SBP that is responsive to 2 L IVF hydration.     He reports that last week, he was in [facility] for "clogged arteries from his pelvis to his knees".      PMH listed below:    PAST MEDICAL, SURGICAL, FAMILY, and SOCIAL HISTORY     Past Medical History:   Diagnosis Date   ??? CAD (coronary artery disease)    ??? Diabetes ([place])    ??? H/O cardiac catheterization 08/02/2017     severe single vessel CAD of the distal LCX which is small caliber and not amenable to PCI.   ??? H/O cardiovascular stress test 07/03/2017    scan shows moderate size, severe intensity, non reversible perfusion defect in inferolateral wall, normal LVEF.   ??? H/O Doppler lower arterial ultrasound 07/11/2017    The Right Proximal and mid SFA exhibits an occlusion. The Left SFA exhibits an occlusion between the mid and distal portions. Right ABIs show Severe peripheral arterial disease.   ??? H/O echocardiogram 05/30/2017    Left ventricular systolic function is normal. Ejection fraction is visually estimated at >55%.   ??? Hyperlipidemia    ??? Hypertension    ??? Nerve pain    ??? PVD (peripheral vascular disease) ([place]) 08/02/2017     Severe lower extremity PAD. See abn Peripherial Angio. ( ABN Arterial US.07/11/17)   ??? Scoliosis      Past  Surgical History:   Procedure Laterality Date   ??? PR SONO GUIDE NEEDLE BIOPSY       Family History   Problem Relation Age of Onset   ??? Stroke Sister    ??? Heart Attack Brother      Family Hx of HTN  Family Hx as reviewed above, otherwise non-contributory  Social History     Socioeconomic History   ??? Marital status: Single     Spouse name: None   ??? Number of children: None   ??? Years of education: None   ??? Highest education level: None   Occupational History   ??? None   Social Needs   ??? Financial resource strain: None   ??? Food insecurity:     Worry: None     Inability: None   ??? Transportation needs:     Medical: None     Non-medical: None   Tobacco Use   ??? Smoking status: Current Every Day Smoker     Packs/day: 1.50     Types: Cigarettes   ??? Smokeless tobacco: Never Used   Substance and Sexual Activity   ??? Alcohol use: No   ??? Drug use: Yes     Frequency: 3.0 times per week  Types: Marijuana   ??? Sexual activity: None   Lifestyle   ??? Physical activity:     Days per week: None     Minutes per session: None   ??? Stress: None   Relationships   ??? Social connections:     Talks on phone: None     Gets together: None     Attends religious service: None     Active member of club or organization: None     Attends meetings of clubs or organizations: None     Relationship status: None   ??? Intimate partner violence:     Fear of current or ex partner: None     Emotionally abused: None     Physically abused: None     Forced sexual activity: None   Other Topics Concern   ??? None   Social History Narrative   ??? None       MEDICATIONS   Medications Prior to Admission  Not in a hospital admission.    Current Medications  Current Facility-Administered Medications   Medication Dose Route Frequency Provider Last Rate Last Dose   ??? ondansetron (ZOFRAN) injection 4 mg  4 mg Intravenous Q30 Min PRN Konrad Felix, DO   4 mg at 09/08/17 1908     Current Outpatient Medications   Medication Sig Dispense Refill   ??? DULoxetine (CYMBALTA) 60 MG  extended release capsule Take 60 mg by mouth daily     ??? gabapentin (NEURONTIN) 400 MG capsule Take 400 mg by mouth 3 times daily.     ??? pantoprazole (PROTONIX) 40 MG tablet Take 40 mg by mouth 2 times daily     ??? B Complex-C-Folic Acid (DIALYVITE TABLET) TABS Take 1 tablet by mouth daily     ??? tamsulosin (FLOMAX) 0.4 MG capsule Take 0.4 mg by mouth daily     ??? tiZANidine (ZANAFLEX) 4 MG tablet Take 4 mg by mouth 2 times daily      ??? lisinopril (PRINIVIL;ZESTRIL) 20 MG tablet Take 20 mg by mouth daily     ??? ondansetron (ZOFRAN ODT) 4 MG disintegrating tablet Take 1 tablet by mouth every 8 hours as needed for Nausea or Vomiting 20 tablet 0   ??? aspirin 81 MG chewable tablet Take 1 tablet by mouth daily 30 tablet 3   ??? metFORMIN (GLUCOPHAGE) 1000 MG tablet Take 1,000 mg by mouth 2 times daily (with meals)     ??? hydrOXYzine (VISTARIL) 50 MG capsule Take 50 mg by mouth 3 times daily as needed for Itching     ??? atorvastatin (LIPITOR) 20 MG tablet Take 20 mg by mouth daily     ??? albuterol sulfate HFA 108 (90 Base) MCG/ACT inhaler Inhale 2 puffs into the lungs every 6 hours as needed for Wheezing     ??? docusate sodium (COLACE) 100 MG capsule Take 1 capsule by mouth daily as needed            Allergies  No Known Allergies    REVIEW OF SYSTEMS   Within above limitations. 14 point review of systems reviewed. Pertinent positive or negative as per HPI or otherwise negative per 14 point systems review.      PHYSICAL EXAM     Wt Readings from Last 3 Encounters:   09/08/17 182 lb (82.6 kg)   08/27/17 181 lb (82.1 kg)   08/02/17 182 lb (82.6 kg)       Blood pressure 112/64, pulse 74, temperature 98.1 ??F (36.7 ??  C), temperature source Oral, resp. rate 11, height 5\' 6"  (1.676 m), weight 182 lb (82.6 kg), SpO2 96 %.    General - AAO x 3  Psych - Appropriate affect/speech. No agitation  Eyes - Eye lids intact. No scleral icterus  ENT - Lips wnl. External ear clear/dry/intact. No thyromegaly on inspection  Neuro - No gross peripheral or  central neuro deficits on inspection  Heart - Sinus. RRR. S1 and S2 present. No elevated JVD appreciated  Lung - Adequate air entry b/l, No crackles/wheezes appreciated  GI - Soft. No guarding/rigidity. Marland Kitchen BS+  GU - No CVA/suprapubic tenderness or palpable bladder distension      LABS AND IMAGING   CBC  @CBCROUNDS @    Last 3 Hemoglobin  Lab Results   Component Value Date    HGB 12.5 09/08/2017    HGB 11.1 08/27/2017    HGB 11.9 07/31/2017     Last 3 WBC/ANC  Lab Results   Component Value Date    WBC 28.8 09/08/2017    WBC 15.3 08/27/2017    WBC 25.5 07/31/2017     No components found for: GRNLOCTYABS  Last 3 Platelets  No results found for: PLATELET  Chemistry  @CHEMROUNDS @  @LYTESROUNDS @  Lab Results   Component Value Date    LDH 137 06/11/2017     Coagulation Studies  Lab Results   Component Value Date    INR 1.12 07/31/2017     Liver Function Studies  Lab Results   Component Value Date    ALT 13 09/08/2017    AST 10 09/08/2017    ALKPHOS 70 09/08/2017    ALBUMIN 1.9 03/12/2017       Recent Imaging    Joffrey, Kerce #[id number] [phone]) (57 y.o. M) (Adm: 09/08/17)    [place] [id number]         Imaging Results (last 7 days)    ??   ?? Procedure Component Value Ref Range Date/Time   ?? CT ABDOMEN PELVIS WO CONTRAST Additional Contrast? None [[id number]] Collected: 09/08/17 2121   ?? Order Status: Completed Updated: 09/08/17 2127   ?? Narrative: ??   ?? EXAMINATION:  CT OF THE ABDOMEN AND PELVIS WITHOUT CONTRAST 09/08/2017 7:39 pm    TECHNIQUE:  CT of the abdomen and pelvis was performed without the administration of  intravenous contrast. Multiplanar reformatted images are provided for review.  Dose modulation, iterative reconstruction, and/or weight based adjustment of  the mA/kV was utilized to reduce the radiation dose to as low as reasonably  achievable.    COMPARISON:  05/31/2017    HISTORY:  ORDERING SYSTEM PROVIDED HISTORY: abd pain/syncope  TECHNOLOGIST PROVIDED HISTORY:  Additional  Contrast?->None    FINDINGS:  Lower Chest: No acute infiltrate at the lung bases.    Organs: The liver, spleen, pancreas and adrenal glands are unremarkable. ??No  acute biliary findings. ??No renal or ureteral calculi or significant  hydronephrosis.    GI/Bowel: Scattered colonic diverticulosis with no CT evidence of  diverticulitis. ??Mild retained gas and stool in the proximal colon. ??The  appendix is unremarkable. ??No small bowel distension. ??The stomach and  duodenal C-loop are intact.    Pelvis: There is no pelvic mass or free pelvic fluid. ??The prostate is not  enlarged. ??Partial distention of the urinary bladder.    Peritoneum/Retroperitoneum: The abdominal aorta is normal in caliber with  mild calcified atherosclerotic plaque. ??No retroperitoneal adenopathy or  upper abdominal ascites.    Bones/Soft Tissues: No acute osseous  or soft tissue abnormality.   ?? Impression: ??   ?? No acute findings within the abdomen or pelvis. ??No evidence of obstructive  uropathy or appendicitis. ??Colonic diverticulosis with no acute features.   ?? CT CERVICAL SPINE WO CONTRAST [[id number]] Collected: 09/08/17 2118   ?? Order Status: Completed Updated: 09/08/17 2124   ?? Narrative: ??   ?? EXAMINATION:  CT OF THE CERVICAL SPINE WITHOUT CONTRAST 09/08/2017 7:39 pm    TECHNIQUE:  CT of the cervical spine was performed without the administration of  intravenous contrast. Multiplanar reformatted images are provided for review.  Dose modulation, iterative reconstruction, and/or weight based adjustment of  the mA/kV was utilized to reduce the radiation dose to as low as reasonably  achievable.    COMPARISON:  None.    HISTORY:  ORDERING SYSTEM PROVIDED HISTORY: fall    FINDINGS:  BONES/ALIGNMENT: There is no evidence of an acute cervical spine fracture. ??2  mm anterolisthesis of C4 on C5 likely related to facet arthropathy.  Vertebral alignment is otherwise maintained.    DEGENERATIVE CHANGES: Degenerative disc disease at C5-6 and C6-7 with  mild  degenerative changes at C4-5. ??Multilevel facet arthropathy. ??The C1-2  articulation is maintained.    SOFT TISSUES: There is no prevertebral soft tissue swelling.   ?? Impression: ??   ?? No acute abnormality of the cervical spine.    Multilevel degenerative disc disease and facet arthropathy as outlined above.   ?? CT Head WO Contrast [[id number]] Collected: 09/08/17 2116   ?? Order Status: Completed Updated: 09/08/17 2121   ?? Narrative: ??   ?? EXAMINATION:  CT OF THE HEAD WITHOUT CONTRAST ??09/08/2017 7:39 pm    TECHNIQUE:  CT of the head was performed without the administration of intravenous  contrast. Dose modulation, iterative reconstruction, and/or weight based  adjustment of the mA/kV was utilized to reduce the radiation dose to as low  as reasonably achievable.    COMPARISON:  None.    HISTORY:  ORDERING SYSTEM PROVIDED HISTORY: HEAD TRAUMA, CLOSED, MILD, GCS >= 13, NO  RISK FACTORS, NEURO EXAM NORMAL  TECHNOLOGIST PROVIDED HISTORY:  Has a "code stroke" or "stroke alert" been called?->No    FINDINGS:  BRAIN/VENTRICLES: There is no acute intracranial hemorrhage, mass effect or  midline shift. ??No abnormal extra-axial fluid collection. ??The gray-white  differentiation is maintained without evidence of an acute infarct. ??There is  no evidence of hydrocephalus. Mild decreased attenuation in the  periventricular and subcortical white matter consistent with small vessel  ischemic change. ??Intracranial atherosclerotic vascular calcification.    ORBITS: The visualized portion of the orbits demonstrate no acute abnormality.    SINUSES: Mucosal thickening in the sphenoid sinuses and frontal sinuses. ??The  mastoid air cells are clear.    SOFT TISSUES/SKULL: ??No acute abnormality of the visualized skull or soft  tissues.   ?? Impression: ??   ?? No acute intracranial abnormality.    Mild chronic small vessel white matter ischemic changes.   ?? XR CHEST PORTABLE [[id number]] Collected: 09/08/17 2016   ?? Order Status: Completed  Updated: 09/08/17 2020   ?? Narrative: ??   ?? EXAMINATION:  ONE XRAY VIEW OF THE CHEST    09/08/2017 7:18 pm    COMPARISON:  07/31/2017.    HISTORY:  ORDERING SYSTEM PROVIDED HISTORY: syncope  TECHNOLOGIST PROVIDED HISTORY:  Reason for exam:->syncope  Reason for Exam: syncope    FINDINGS:  The cardiomediastinal silhouette is unremarkable. ??The lungs are clear. ??No  infiltrate, pleural  fluid or evidence of overt failure. ??No acute osseous  findings.   ?? Impression: ??   ?? No acute cardiopulmonary disease.   ??     EKG personally reviewed with rate 82, NSR      Relevant labs and imaging reviewed    ASSESSMENT AND PLAN     AKI  - hold metformin, lisinopril  - decrease dose of neurontin  - IVF bolus, mIVF  - trend Cr    Syncope could be due to low BP from pre-renal  - ambulate after vol repletion  - check UA, urine cx  - tele, pulse ox    DMII, uncontrolled  - A1c check  - start lantus, ISS  - consult endocrine to establish care    HTN    Leukocytosis  Appears to described hx of CLL and follows with oncology    PAD    Neuropathic pain    Lovenox ppx    Case d/w ED physician    [place], Internal Medicine  09/08/2017 at 10:04 PM

## 2017-09-08 NOTE — ED Notes (Signed)
Report called and given to nurse Rob on 4N. Pt to be transported to room 4107.      Ellender Hose, RN  09/08/17 2212

## 2017-09-08 NOTE — Progress Notes (Signed)
Medication History  [facility]    Patient Name: Ricardo Foley 1960-01-30     Medication history has been completed by: Felix Ahmadi, CPhT    Source(s) of information: Patient and Insurance claims    Primary Care Physician: Hazel Sams, APRN - CNP     Pharmacy: [facility] as of 09/08/2017    (No Known Allergies)        Prior to Admission medications    Medication Sig Start Date End Date Taking? Authorizing Provider   DULoxetine (CYMBALTA) 60 MG extended release capsule Take 60 mg by mouth daily   Yes Historical Provider, MD   gabapentin (NEURONTIN) 400 MG capsule Take 400 mg by mouth 3 times daily.   Yes Historical Provider, MD   pantoprazole (PROTONIX) 40 MG tablet Take 40 mg by mouth 2 times daily   Yes Historical Provider, MD   B Complex-C-Folic Acid (DIALYVITE TABLET) TABS Take 1 tablet by mouth daily   Yes Historical Provider, MD   tamsulosin (FLOMAX) 0.4 MG capsule Take 0.4 mg by mouth daily   Yes Historical Provider, MD   tiZANidine (ZANAFLEX) 4 MG tablet Take 4 mg by mouth 2 times daily    Yes Historical Provider, MD   lisinopril (PRINIVIL;ZESTRIL) 20 MG tablet Take 20 mg by mouth daily   Yes Historical Provider, MD   ondansetron (ZOFRAN ODT) 4 MG disintegrating tablet Take 1 tablet by mouth every 8 hours as needed for Nausea or Vomiting 06/02/17  Yes Huston Foley, MD   aspirin 81 MG chewable tablet Take 1 tablet by mouth daily 06/03/17  Yes Huston Foley, MD   metFORMIN (GLUCOPHAGE) 1000 MG tablet Take 1,000 mg by mouth 2 times daily (with meals)   Yes Historical Provider, MD   hydrOXYzine (VISTARIL) 50 MG capsule Take 50 mg by mouth 3 times daily as needed for Itching   Yes Historical Provider, MD   atorvastatin (LIPITOR) 20 MG tablet Take 20 mg by mouth daily   Yes Historical Provider, MD   albuterol sulfate HFA 108 (90 Base) MCG/ACT inhaler Inhale 2 puffs into the lungs every 6 hours as needed for Wheezing    Historical Provider, MD    docusate sodium (COLACE) 100 MG capsule Take 1 capsule by mouth daily as needed     Historical Provider, MD       Medications added or changed (ex. new medication, dosage change, interval change, formulation change):  Docusate frequency clarified as qd prn as no sig entered  Cymbalta dose changed to 60 mg qd from 30 mg qd  Gabapentin dose changed to 400 mg tid from 300 mg tid  Protonix changed to tablet bid from packet qd  Tizanidine dose clarified as 4 mg bid as no dose or sig entered  Albuterol new medication  Dialyvite new medication  Flomax new medication    Medications removed from list (include reason, ex. noncompliance, medication cost, therapy complete etc.):   Dicyclomine no longer taking  Famotidine no longer taking    Other Comments:  Reviewed and updated med list per patient and verified with claims  Patient states he thinks he is taking 30 mg of lisinopril qd however per claims patient is taking 20 mg qd  Patient has recent claims for Coreg 12.5 mg bid however patient not sure if he is taking as it does not sound familiar and he is in the process of switching pharmacies  Patients atorvastatin  is being increased to 40 mg qd however has not started new dose yet    To my knowledge the above medication history is accurate as of 09/08/2017 8:08 PM.   Felix Ahmadi, CPhT   09/08/2017 8:08 PM '

## 2017-09-08 NOTE — ED Provider Notes (Signed)
[facility]      TRIAGE CHIEF COMPLAINT:   Loss of Consciousness and Emesis      HOPI:  Ricardo Foley is a 57 y.o. male that presents with pain of loss of consciousness, nausea vomiting, chronic pain.  Patient has chronic abdominal pain chronic back pain.  Patient states for the past couple days he has had nausea vomiting abdominal pain at loss of consciousness upon standing.  Denies any headache chest pain shortness of breath no other questions or concerns no seizures.    REVIEW OF SYSTEMS:  At least 10 systems reviewed and otherwise acutely negative except as in the HOPI.    Review of Systems   Constitutional: Positive for fatigue.   HENT: Negative.    Eyes: Negative.    Respiratory: Negative.    Cardiovascular: Negative.    Gastrointestinal: Positive for abdominal pain, nausea and vomiting.   Endocrine: Negative.    Genitourinary: Negative.    Musculoskeletal: Positive for arthralgias, back pain and myalgias.   Skin: Negative.    Allergic/Immunologic: Negative.    Neurological: Positive for dizziness, syncope and light-headedness.   Hematological: Negative.    Psychiatric/Behavioral: Negative.    All other systems reviewed and are negative.      Past Medical History:   Diagnosis Date   . CAD (coronary artery disease)    . Diabetes (HCC)    . H/O cardiac catheterization 08/02/2017     severe single vessel CAD of the distal LCX which is small caliber and not amenable to PCI.   Marland Kitchen H/O cardiovascular stress test 07/03/2017    scan shows moderate size, severe intensity, non reversible perfusion defect in inferolateral wall, normal LVEF.   Marland Kitchen H/O Doppler lower arterial ultrasound 07/11/2017    The Right Proximal and mid SFA exhibits an occlusion. The Left SFA exhibits an occlusion between the mid and distal portions. Right ABIs show Severe peripheral arterial disease.   . H/O echocardiogram 05/30/2017    Left ventricular systolic function is normal. Ejection fraction is visually estimated at  >55%.   Marland Kitchen Hyperlipidemia    . Hypertension    . Nerve pain    . PVD (peripheral vascular disease) (HCC) 08/02/2017     Severe lower extremity PAD. See abn Peripherial Angio. ( ABN Arterial US.07/11/17)   . Scoliosis      Past Surgical History:   Procedure Laterality Date   . PR SONO GUIDE NEEDLE BIOPSY       Family History   Problem Relation Age of Onset   . Stroke Sister    . Heart Attack Brother      Social History     Socioeconomic History   . Marital status: Single     Spouse name: Not on file   . Number of children: Not on file   . Years of education: Not on file   . Highest education level: Not on file   Occupational History   . Not on file   Social Needs   . Financial resource strain: Not on file   . Food insecurity:     Worry: Not on file     Inability: Not on file   . Transportation needs:     Medical: Not on file     Non-medical: Not on file   Tobacco Use   . Smoking status: Current Every Day Smoker     Packs/day: 1.50     Types: Cigarettes   . Smokeless tobacco: Never Used  Substance and Sexual Activity   . Alcohol use: No   . Drug use: Yes     Frequency: 3.0 times per week     Types: Marijuana   . Sexual activity: Not on file   Lifestyle   . Physical activity:     Days per week: Not on file     Minutes per session: Not on file   . Stress: Not on file   Relationships   . Social connections:     Talks on phone: Not on file     Gets together: Not on file     Attends religious service: Not on file     Active member of club or organization: Not on file     Attends meetings of clubs or organizations: Not on file     Relationship status: Not on file   . Intimate partner violence:     Fear of current or ex partner: Not on file     Emotionally abused: Not on file     Physically abused: Not on file     Forced sexual activity: Not on file   Other Topics Concern   . Not on file   Social History Narrative   . Not on file     Current Facility-Administered Medications   Medication Dose Route Frequency Provider Last  Rate Last Dose   . ondansetron (ZOFRAN) injection 4 mg  4 mg Intravenous Q30 Min PRN Fayne Mediate, DO   4 mg at 09/08/17 1908     Current Outpatient Medications   Medication Sig Dispense Refill   . DULoxetine (CYMBALTA) 60 MG extended release capsule Take 60 mg by mouth daily     . gabapentin (NEURONTIN) 400 MG capsule Take 400 mg by mouth 3 times daily.     . pantoprazole (PROTONIX) 40 MG tablet Take 40 mg by mouth 2 times daily     . B Complex-C-Folic Acid (DIALYVITE TABLET) TABS Take 1 tablet by mouth daily     . tamsulosin (FLOMAX) 0.4 MG capsule Take 0.4 mg by mouth daily     . tiZANidine (ZANAFLEX) 4 MG tablet Take 4 mg by mouth 2 times daily      . lisinopril (PRINIVIL;ZESTRIL) 20 MG tablet Take 20 mg by mouth daily     . ondansetron (ZOFRAN ODT) 4 MG disintegrating tablet Take 1 tablet by mouth every 8 hours as needed for Nausea or Vomiting 20 tablet 0   . aspirin 81 MG chewable tablet Take 1 tablet by mouth daily 30 tablet 3   . metFORMIN (GLUCOPHAGE) 1000 MG tablet Take 1,000 mg by mouth 2 times daily (with meals)     . hydrOXYzine (VISTARIL) 50 MG capsule Take 50 mg by mouth 3 times daily as needed for Itching     . atorvastatin (LIPITOR) 20 MG tablet Take 20 mg by mouth daily     . albuterol sulfate HFA 108 (90 Base) MCG/ACT inhaler Inhale 2 puffs into the lungs every 6 hours as needed for Wheezing     . docusate sodium (COLACE) 100 MG capsule Take 1 capsule by mouth daily as needed         No Known Allergies  Current Facility-Administered Medications   Medication Dose Route Frequency Provider Last Rate Last Dose   . ondansetron (ZOFRAN) injection 4 mg  4 mg Intravenous Q30 Min PRN Fayne Mediate, DO   4 mg at 09/08/17 2725     Current Outpatient Medications   Medication  Sig Dispense Refill   . DULoxetine (CYMBALTA) 60 MG extended release capsule Take 60 mg by mouth daily     . gabapentin (NEURONTIN) 400 MG capsule Take 400 mg by mouth 3 times daily.     . pantoprazole (PROTONIX) 40 MG tablet Take 40  mg by mouth 2 times daily     . B Complex-C-Folic Acid (DIALYVITE TABLET) TABS Take 1 tablet by mouth daily     . tamsulosin (FLOMAX) 0.4 MG capsule Take 0.4 mg by mouth daily     . tiZANidine (ZANAFLEX) 4 MG tablet Take 4 mg by mouth 2 times daily      . lisinopril (PRINIVIL;ZESTRIL) 20 MG tablet Take 20 mg by mouth daily     . ondansetron (ZOFRAN ODT) 4 MG disintegrating tablet Take 1 tablet by mouth every 8 hours as needed for Nausea or Vomiting 20 tablet 0   . aspirin 81 MG chewable tablet Take 1 tablet by mouth daily 30 tablet 3   . metFORMIN (GLUCOPHAGE) 1000 MG tablet Take 1,000 mg by mouth 2 times daily (with meals)     . hydrOXYzine (VISTARIL) 50 MG capsule Take 50 mg by mouth 3 times daily as needed for Itching     . atorvastatin (LIPITOR) 20 MG tablet Take 20 mg by mouth daily     . albuterol sulfate HFA 108 (90 Base) MCG/ACT inhaler Inhale 2 puffs into the lungs every 6 hours as needed for Wheezing     . docusate sodium (COLACE) 100 MG capsule Take 1 capsule by mouth daily as needed          Nursing Notes Reviewed    VITAL SIGNS:  ED Triage Vitals [09/08/17 1754]   Enc Vitals Group      BP (!) 94/54      Pulse 83      Resp 22      Temp 98.1 F (36.7 C)      Temp Source Oral      SpO2 94 %      Weight 182 lb (82.6 kg)      Height 5\' 6"  (1.676 m)      Head Circumference       Peak Flow       Pain Score       Pain Loc       Pain Edu?       Excl. in GC?        PHYSICAL EXAM:  Physical Exam   Constitutional: He is oriented to person, place, and time. He appears well-developed and well-nourished. He is active and cooperative.  Non-toxic appearance. He does not have a sickly appearance. He does not appear ill. No distress.   HENT:   Head: Normocephalic and atraumatic.   Right Ear: External ear normal.   Left Ear: External ear normal.   Mouth/Throat: Oropharynx is clear and moist. No oropharyngeal exudate.   Eyes: Pupils are equal, round, and reactive to light. Conjunctivae and EOM are normal. Right eye  exhibits no discharge. Left eye exhibits no discharge. No scleral icterus.   Neck: Normal range of motion, full passive range of motion without pain and phonation normal. No JVD present. No neck rigidity. No edema, no erythema and normal range of motion present.   Cardiovascular: Normal rate, regular rhythm, normal heart sounds and intact distal pulses. Exam reveals no gallop and no friction rub.   No murmur heard.  Pulmonary/Chest: Effort normal and breath sounds normal. No stridor. No  respiratory distress. He has no wheezes. He has no rales.   Abdominal: Soft. Normal appearance and bowel sounds are normal. He exhibits no distension, no pulsatile midline mass and no mass. There is tenderness in the left lower quadrant. There is no rigidity, no rebound, no guarding, no tenderness at McBurney's point and negative Murphy's sign. No hernia.   Musculoskeletal: Normal range of motion. He exhibits tenderness. He exhibits no edema or deformity.   Neurological: He is alert and oriented to person, place, and time. He has normal strength. He is not disoriented. He displays no atrophy and no tremor. No cranial nerve deficit or sensory deficit. He exhibits normal muscle tone. He displays no seizure activity. Coordination normal. GCS eye subscore is 4. GCS verbal subscore is 5. GCS motor subscore is 6.   Skin: Skin is warm. No rash noted. He is not diaphoretic. No erythema. No pallor.   Psychiatric: He has a normal mood and affect. His behavior is normal. Judgment and thought content normal.   Nursing note and vitals reviewed.        I have reviewed andinterpreted all of the currently available lab results from this visit (if applicable):    Results for orders placed or performed during the hospital encounter of 09/08/17   CBC Auto Differential   Result Value Ref Range    WBC 28.8 (H) 4.0 - 10.5 K/CU MM    RBC 4.50 (L) 4.6 - 6.2 M/CU MM    Hemoglobin 12.5 (L) 13.5 - 18.0 GM/DL    Hematocrit 52.8 (L) 42 - 52 %    MCV 88.0 78 -  100 FL    MCH 27.8 27 - 31 PG    MCHC 31.6 (L) 32.0 - 36.0 %    RDW 16.5 (H) 11.7 - 14.9 %    Platelets 508 (H) 140 - 440 K/CU MM    MPV 9.6 7.5 - 11.1 FL    Differential Type AUTOMATED DIFFERENTIAL     Segs Relative 59.2 36 - 66 %    Lymphocytes % 33.4 24 - 44 %    Monocytes % 6.7 (H) 0 - 4 %    Eosinophils % 0.0 0 - 3 %    Basophils % 0.2 0 - 1 %    Segs Absolute 17.1 K/CU MM    Lymphocytes # 9.6 K/CU MM    Monocytes # 1.9 K/CU MM    Eosinophils # 0.0 K/CU MM    Basophils # 0.1 K/CU MM    Nucleated RBC % 0.0 %    Total Nucleated RBC 0.0 K/CU MM    Total Immature Neutrophil 0.15 K/CU MM    Immature Neutrophil % 0.5 (H) 0 - 0.43 %   CMP   Result Value Ref Range    Sodium 134 (L) 135 - 145 MMOL/L    Potassium 4.6 3.5 - 5.1 MMOL/L    Chloride 96 (L) 99 - 110 mMol/L    CO2 24 21 - 32 MMOL/L    BUN 46 (H) 6 - 23 MG/DL    CREATININE 2.4 (H) 0.9 - 1.3 MG/DL    Glucose 413 (H) 70 - 99 MG/DL    Calcium 9.8 8.3 - 24.4 MG/DL    Alb 5.0 3.4 - 5.0 GM/DL    Total Protein 8.0 6.4 - 8.2 GM/DL    Total Bilirubin 0.3 0.0 - 1.0 MG/DL    ALT 13 10 - 40 U/L    AST 10 (L) 15 - 37  IU/L    Alkaline Phosphatase 70 40 - 129 IU/L    GFR Non-African American 28 (L) >60 mL/min/1.49m2    GFR African American 34 (L) >60 mL/min/1.71m2    Anion Gap 14 4 - 16   Troponin   Result Value Ref Range    Troponin T <0.010 <0.01 NG/ML   CK   Result Value Ref Range    Total CK 58 38 - 174 IU/L   Brain Natriuretic Peptide   Result Value Ref Range    Pro-BNP 396.5 (H) <300 PG/ML   Magnesium   Result Value Ref Range    Magnesium 1.8 1.8 - 2.4 mg/dl   Lactic Acid, Plasma   Result Value Ref Range    Lactate 1.8 0.4 - 2.0 mMOL/L   Lipase   Result Value Ref Range    Lipase 195 (H) 13 - 60 IU/L        Radiographs (if obtained):  []  The following radiograph was interpreted by myself in the absence of a radiologist:  [x]  Radiologist's Report Reviewed:    CXR, CT Brain, C spine, Abd/Pelv    Vl Dup Lower Extremity Venous Bilateral    Result Date: 08/27/2017  EXAMINATION:  DUPLEX VENOUS ULTRASOUND OF THE BILATERAL LOWER EXTREMITIES, 08/27/2017 3:10 pm TECHNIQUE: Duplex ultrasound and Doppler images were obtained of the bilateral lower extremities COMPARISON: None. HISTORY: ORDERING SYSTEM PROVIDED HISTORY: swelling, pain TECHNOLOGIST PROVIDED HISTORY: Reason for exam:->swelling, pain Reason for Exam: bilateral leg pain, swelling Type of Exam: Initial FINDINGS: The visualized veins of the bilateral lower extremities are patent and free of echogenic thrombus. The veins are normally compressible and have normal phasic flow.     No evidence of DVT in either lower extremity.       EKG (if obtained): (All EKG's are interpreted by myself in the absence of a cardiologist)      12 lead EKG per my interpretation:  Normal Sinus Rhythm 82  Axis is   Normal  QTc is  453  There is no specific T wave changes appreciated.  There is no specific ST wave changes appreciated.    Prior EKG to compare with was not available       MDM:    Here with chronic abdominal pain chronic pain, nausea vomiting loss of consciousness upon standing.  Patient has chronic pain he is in no distress blood pressure was 90 on arrival he has no chest pain.  Imaging performed negative he does have elevated lipase he is in acute renal insufficiency given fluids pain medicine nausea medicine given symptoms and lab results, will admit for hydration, rechecked patient watching tv in no distress. Patient admitted.    CLINICAL IMPRESSION:  Final diagnoses:   Syncope and collapse   Chronic abdominal pain   Other chronic pain   Leukocytosis, unspecified type   Serum creatinine raised   Elevated lipase       (Please note that portions of this note may have been completed with a voice recognition program. Efforts were made to edit the dictations but occasionally words aremis-transcribed.)    DISPOSITION REFERRAL (if applicable):  Rushie Goltz, APRN - CNP  [address]  [place] [address]  [phone]            DISPOSITION  MEDICATIONS (if applicable):  New Prescriptions    No medications on file          Fayne Mediate, DO  Fayne Mediate, DO  09/08/17 2205

## 2017-09-08 NOTE — ED Notes (Signed)
Hospitalist returned call @ 2153 -- parked call [phone] and notified Dr Collier Salina  Waiting orders     Robyne Askew  09/08/17 2153

## 2017-09-08 NOTE — Discharge Instructions (Signed)
Continuity of Care Form    Patient Name: Ricardo Foley   DOB:  05-Mar-1960  MRN:  [id number]    Admit date:  09/08/2017  Discharge date:  ***    Code Status Order: Prior   Advance Directives:     Admitting Physician:  No admitting provider for patient encounter.  PCP: Hazel Sams, APRN - CNP    Discharging Nurse: [facility] Unit/Room#: ED34/ED-34  Discharging Unit Phone Number: ***    Emergency Contact:   Extended Emergency Contact Information  Primary Emergency Contact: Gloris Ham  Home Phone: [phone]  Mobile Phone: [phone]  Relation: Other  Interpreter needed? No    Past Surgical History:  Past Surgical History:   Procedure Laterality Date   ??? PR SONO GUIDE NEEDLE BIOPSY         Immunization History:     There is no immunization history on file for this patient.    Active Problems:  Patient Active Problem List   Diagnosis Code   ??? Chest pain R07.9   ??? Lower abdominal pain R10.30   ??? Anemia D64.9   ??? Abnormal stress test R94.39   ??? PAD (peripheral artery disease) (HCC) I73.9       Isolation/Infection:   Isolation          No Isolation            Nurse Assessment:  Last Vital Signs: BP (!) 94/54    Pulse 83    Temp 98.1 ??F (36.7 ??C) (Oral)    Resp 22    Ht 5\' 6"  (1.676 m)    Wt 182 lb (82.6 kg)    SpO2 94%    BMI 29.38 kg/m??     Last documented pain score (0-10 scale): Pain Level: 8  Last Weight:   Wt Readings from Last 1 Encounters:   09/08/17 182 lb (82.6 kg)     Mental Status:  {IP PT MENTAL STATUS:20030}    IV Access:  {MH COC IV ACCESS:304088262}    Nursing Mobility/ADLs:  Walking   {CHP DME ZSWF:093235573}  Transfer  {CHP DME UKGU:542706237}  Bathing  {CHP DME SEGB:151761607}  Dressing  {CHP DME PXTG:626948546}  Toileting  {CHP DME EVOJ:500938182}  Feeding  {CHP DME XHBZ:169678938}  Med Admin  {CHP DME BOFB:510258527}  Med Delivery   {MH COC MED Delivery:304088264}    Wound Care Documentation and Therapy:        Elimination:  Continence:   ?? Bowel: {YES / PO:24235}  ?? Bladder:  {YES / TI:14431}  Urinary Catheter: {Urinary Catheter:304088013}   Colostomy/Ileostomy/Ileal Conduit: {YES / VQ:00867}       Date of Last BM: ***  No intake or output data in the 24 hours ending 09/08/17 1840  No intake/output data recorded.    Safety Concerns:     [place] COC Safety Concerns:304088272}    Impairments/Disabilities:      [facility] COC Impairments/Disabilities:304088273}    Nutrition Therapy:  Current Nutrition Therapy:   [place] COC Diet List:304088271}    Routes of Feeding: {CHP DME Other Feedings:304088042}  Liquids: {Slp liquid thickness:30034}  Daily Fluid Restriction: {CHP DME Yes amt example:304088041}  Last Modified Barium Swallow with Video (Video Swallowing Test): {Done Not Done YPPJ:093267124}    Treatments at the Time of Hospital Discharge:   Respiratory Treatments: ***  Oxygen Therapy:  {Therapy; copd oxygen:17808}  Ventilator:    {MH CC Vent PYKD:983382505}    Rehab Therapies: {THERAPEUTIC [phone]}  Weight Bearing Status/Restrictions: {MH CC  Weight Bearing:304508812}  Other Medical Equipment (for information only, NOT a DME order):  {EQUIPMENT:304520077}  Other Treatments: ***    Patient's personal belongings (please select all that are sent with patient):  {CHP DME Belongings:304088044}    RN SIGNATURE:  {Esignature:304088025}    CASE MANAGEMENT/SOCIAL WORK SECTION    Inpatient Status Date: ***    Readmission Risk Assessment Score:  Readmission Risk              Risk of Unplanned Readmission:        0           Discharging to Facility/ Agency   ?? Name:   ?? Address:  ?? Phone:  ?? Fax:    Dialysis Facility (if applicable)   ?? Name:  ?? Address:  ?? Dialysis Schedule:  ?? Phone:  ?? Fax:    Case Manager/Social Worker signature: {Esignature:304088025}    PHYSICIAN SECTION    Prognosis: {Prognosis:[phone]}    Condition at Discharge: [facility] Patient Condition:304088024}    Rehab Potential (if transferring to Rehab): {Prognosis:[phone]}    Recommended Labs or Other Treatments After Discharge:  ***    Physician Certification: I certify the above information and transfer of Frederic Tones  is necessary for the continuing treatment of the diagnosis listed and that he requires {Admit to Appropriate Level of Care:20763} for {GREATER/LESS:304500278} 30 days.     Update Admission H&P: {CHP DME Changes in HCWCB:762831517}    PHYSICIAN SIGNATURE:  {Esignature:304088025}

## 2017-09-08 NOTE — ED Notes (Signed)
Paged hospitalist @ 2140 through [employer] [facility] for Dr Blair Dolphin  09/08/17 2140

## 2017-09-08 NOTE — ED Triage Notes (Signed)
Pt reports "passing out" at home reports vomiting for 2 days

## 2017-09-09 LAB — CBC WITH AUTO DIFFERENTIAL
Bands Absolute: 0.22 10*3/uL
Bands Relative: 1 % — ABNORMAL LOW (ref 5–11)
Basophils %: 1 % (ref 0–1)
Basophils Absolute: 0.2 10*3/uL
Hematocrit: 34.7 % — ABNORMAL LOW (ref 42–52)
Hemoglobin: 10.9 GM/DL — ABNORMAL LOW (ref 13.5–18.0)
Lymphocytes %: 41 % (ref 24–44)
Lymphocytes Absolute: 9.1 10*3/uL
MCH: 27.7 PG (ref 27–31)
MCHC: 31.4 % — ABNORMAL LOW (ref 32.0–36.0)
MCV: 88.3 FL (ref 78–100)
MPV: 9.5 FL (ref 7.5–11.1)
Monocytes %: 3 % (ref 0–4)
Monocytes Absolute: 0.7 10*3/uL
Platelets: 405 10*3/uL (ref 140–440)
RBC: 3.93 10*6/uL — ABNORMAL LOW (ref 4.6–6.2)
RDW: 16.4 % — ABNORMAL HIGH (ref 11.7–14.9)
Segs Absolute: 12.1 10*3/uL
Segs Relative: 54 % (ref 36–66)
WBC: 22.3 10*3/uL — ABNORMAL HIGH (ref 4.0–10.5)

## 2017-09-09 LAB — URINALYSIS
Bacteria, UA: NEGATIVE /HPF
Bilirubin Urine: NEGATIVE MG/DL
Blood, Urine: NEGATIVE
Glucose, Urine: NEGATIVE MG/DL
Hyaline Casts, UA: 5 /LPF
Ketones, Urine: NEGATIVE MG/DL
Leukocyte Esterase, Urine: NEGATIVE
Nitrite Urine, Quantitative: NEGATIVE
Protein, UA: NEGATIVE MG/DL
RBC, UA: NONE SEEN /HPF (ref 0–3)
Specific Gravity, UA: 1.017 (ref 1.001–1.035)
Trichomonas, UA: NONE SEEN /HPF
Urobilinogen, Urine: NORMAL MG/DL (ref 0.2–1.0)
WBC, UA: <1 /HPF (ref 0–2)
pH, Urine: 5 (ref 5.0–8.0)

## 2017-09-09 LAB — POCT GLUCOSE
POC Glucose: 150 MG/DL — ABNORMAL HIGH (ref 70–99)
POC Glucose: 128 MG/DL — ABNORMAL HIGH (ref 70–99)
POC Glucose: 133 MG/DL — ABNORMAL HIGH (ref 70–99)

## 2017-09-09 LAB — URINE DRUG SCREEN
Amphetamines: NEGATIVE
Barbiturate Screen, Ur: NEGATIVE
Benzodiazepine Screen, Urine: NEGATIVE
Cannabinoid Scrn, Ur: POSITIVE — AB
Cocaine Metabolite: NEGATIVE
Opiates, Urine: NEGATIVE
Oxycodone: POSITIVE — AB
Phencyclidine, Urine: NEGATIVE

## 2017-09-09 LAB — BASIC METABOLIC PANEL
Anion Gap: 9 (ref 4–16)
BUN: 35 MG/DL — ABNORMAL HIGH (ref 6–23)
CO2: 21 MMOL/L (ref 21–32)
Calcium: 8.5 MG/DL (ref 8.3–10.6)
Chloride: 105 mMol/L (ref 99–110)
Creatinine: 1.5 MG/DL — ABNORMAL HIGH (ref 0.9–1.3)
GFR African American: 59 mL/min/{1.73_m2} — ABNORMAL LOW (ref >60)
GFR Non-African American: 49 mL/min/{1.73_m2} — ABNORMAL LOW (ref >60)
Glucose: 160 MG/DL — ABNORMAL HIGH (ref 70–99)
Potassium: 4.9 MMOL/L (ref 3.5–5.1)
Sodium: 135 MMOL/L (ref 135–145)

## 2017-09-09 LAB — LACTIC ACID: Lactate: 0.7 mMOL/L (ref 0.4–2.0)

## 2017-09-09 LAB — MAGNESIUM: Magnesium: 1.7 mg/dl — ABNORMAL LOW (ref 1.8–2.4)

## 2017-09-09 LAB — PROCALCITONIN: Procalcitonin: 0.067

## 2017-09-09 LAB — HEMOGLOBIN A1C
Hemoglobin A1C: 6.6 % — ABNORMAL HIGH (ref 4.2–6.3)
eAG: 143 mg/dL

## 2017-09-09 MED ORDER — HYDROCODONE-ACETAMINOPHEN 5-325 MG PO TABS
5-325 MG | ORAL_TABLET | Freq: Three times a day (TID) | ORAL | 0 refills | Status: AC | PRN
Start: 2017-09-09 — End: 2017-09-12

## 2017-09-09 MED ORDER — INSULIN LISPRO 100 UNIT/ML SC SOLN
100 UNIT/ML | Freq: Three times a day (TID) | SUBCUTANEOUS | Status: DC
Start: 2017-09-09 — End: 2017-09-09

## 2017-09-09 MED ORDER — MAGNESIUM SULFATE IN D5W 1-5 GM/100ML-% IV SOLN
1-5 GM/100ML-% | Freq: Once | INTRAVENOUS | Status: DC
Start: 2017-09-09 — End: 2017-09-09

## 2017-09-09 MED ORDER — DEXTROSE 50 % IV SOLN
50 % | INTRAVENOUS | Status: DC | PRN
Start: 2017-09-09 — End: 2017-09-09

## 2017-09-09 MED ORDER — GABAPENTIN 100 MG PO CAPS
100 MG | Freq: Three times a day (TID) | ORAL | Status: DC
Start: 2017-09-09 — End: 2017-09-09
  Administered 2017-09-09 (×3): 200 mg via ORAL

## 2017-09-09 MED ORDER — HYDROXYZINE PAMOATE 25 MG PO CAPS
25 MG | Freq: Three times a day (TID) | ORAL | Status: DC | PRN
Start: 2017-09-09 — End: 2017-09-09

## 2017-09-09 MED ORDER — INSULIN GLARGINE 100 UNIT/ML SC SOLN
100 UNIT/ML | Freq: Every evening | SUBCUTANEOUS | Status: DC
Start: 2017-09-09 — End: 2017-09-09

## 2017-09-09 MED ORDER — ALBUTEROL SULFATE HFA 108 (90 BASE) MCG/ACT IN AERS
108 (90 Base) MCG/ACT | Freq: Four times a day (QID) | RESPIRATORY_TRACT | Status: DC | PRN
Start: 2017-09-09 — End: 2017-09-09

## 2017-09-09 MED ORDER — MAGNESIUM OXIDE 400 MG PO CAPS
400 | ORAL_CAPSULE | Freq: Every day | ORAL | 0 refills | Status: AC
Start: 2017-09-09 — End: 2017-09-23

## 2017-09-09 MED ORDER — TIZANIDINE HCL 4 MG PO TABS
4 MG | Freq: Two times a day (BID) | ORAL | Status: DC
Start: 2017-09-09 — End: 2017-09-09
  Administered 2017-09-09 (×2): 4 mg via ORAL

## 2017-09-09 MED ORDER — ENOXAPARIN SODIUM 30 MG/0.3ML SC SOLN
30 MG/0.3ML | Freq: Every day | SUBCUTANEOUS | Status: DC
Start: 2017-09-09 — End: 2017-09-09
  Administered 2017-09-09: 12:00:00 30 mg via SUBCUTANEOUS

## 2017-09-09 MED ORDER — ACETAMINOPHEN 325 MG PO TABS
325 MG | ORAL | Status: DC | PRN
Start: 2017-09-09 — End: 2017-09-09

## 2017-09-09 MED ORDER — ONDANSETRON HCL 4 MG/2ML IJ SOLN
4 MG/2ML | Freq: Four times a day (QID) | INTRAMUSCULAR | Status: DC | PRN
Start: 2017-09-09 — End: 2017-09-09

## 2017-09-09 MED ORDER — PANTOPRAZOLE SODIUM 40 MG PO TBEC
40 MG | Freq: Two times a day (BID) | ORAL | Status: DC
Start: 2017-09-09 — End: 2017-09-09
  Administered 2017-09-09 (×3): 40 mg via ORAL

## 2017-09-09 MED ORDER — OXYCODONE-ACETAMINOPHEN 5-325 MG PO TABS
5-325 MG | ORAL | Status: DC | PRN
Start: 2017-09-09 — End: 2017-09-09
  Administered 2017-09-09 (×4): 1 via ORAL

## 2017-09-09 MED ORDER — TAMSULOSIN HCL 0.4 MG PO CAPS
0.4 MG | Freq: Every day | ORAL | Status: DC
Start: 2017-09-09 — End: 2017-09-09
  Administered 2017-09-09: 12:00:00 0.4 mg via ORAL

## 2017-09-09 MED ORDER — INSULIN LISPRO 100 UNIT/ML SC SOLN
100 UNIT/ML | Freq: Every evening | SUBCUTANEOUS | Status: DC
Start: 2017-09-09 — End: 2017-09-09
  Administered 2017-09-09: 04:00:00 1 [IU] via SUBCUTANEOUS

## 2017-09-09 MED ORDER — PROCHLORPERAZINE EDISYLATE 10 MG/2ML IJ SOLN
10 MG/2ML | Freq: Four times a day (QID) | INTRAMUSCULAR | Status: DC | PRN
Start: 2017-09-09 — End: 2017-09-09

## 2017-09-09 MED ORDER — GLUCAGON HCL RDNA (DIAGNOSTIC) 1 MG IJ SOLR
1 MG | INTRAMUSCULAR | Status: DC | PRN
Start: 2017-09-09 — End: 2017-09-09

## 2017-09-09 MED ORDER — DULOXETINE HCL 30 MG PO CPEP
30 MG | Freq: Every day | ORAL | Status: DC
Start: 2017-09-09 — End: 2017-09-09
  Administered 2017-09-09: 12:00:00 60 mg via ORAL

## 2017-09-09 MED ORDER — ONDANSETRON 4 MG PO TBDP
4 MG | Freq: Three times a day (TID) | ORAL | Status: DC | PRN
Start: 2017-09-09 — End: 2017-09-09

## 2017-09-09 MED ORDER — DEXTROSE 5 % IV SOLN
5 % | INTRAVENOUS | Status: DC | PRN
Start: 2017-09-09 — End: 2017-09-09

## 2017-09-09 MED ORDER — GLUCOSE 40 % PO GEL
40 % | ORAL | Status: DC | PRN
Start: 2017-09-09 — End: 2017-09-09

## 2017-09-09 MED ORDER — ATORVASTATIN CALCIUM 20 MG PO TABS
20 MG | Freq: Every day | ORAL | Status: DC
Start: 2017-09-09 — End: 2017-09-09
  Administered 2017-09-09: 12:00:00 20 mg via ORAL

## 2017-09-09 MED ORDER — SODIUM CHLORIDE 0.9 % IV SOLN
0.9 % | INTRAVENOUS | Status: DC
Start: 2017-09-09 — End: 2017-09-09
  Administered 2017-09-09 (×2): via INTRAVENOUS

## 2017-09-09 MED ORDER — ASPIRIN 81 MG PO CHEW
81 MG | Freq: Every day | ORAL | Status: DC
Start: 2017-09-09 — End: 2017-09-09
  Administered 2017-09-09: 12:00:00 81 mg via ORAL

## 2017-09-09 MED ORDER — DOCUSATE SODIUM 100 MG PO CAPS
100 MG | Freq: Two times a day (BID) | ORAL | Status: DC | PRN
Start: 2017-09-09 — End: 2017-09-09

## 2017-09-09 MED ORDER — TRAMADOL HCL 50 MG PO TABS
50 MG | ORAL | Status: DC | PRN
Start: 2017-09-09 — End: 2017-09-09
  Administered 2017-09-09: 12:00:00 50 mg via ORAL

## 2017-09-09 MED FILL — TRAMADOL HCL 50 MG PO TABS: 50 mg | ORAL | Qty: 1

## 2017-09-09 MED FILL — TIZANIDINE HCL 4 MG PO TABS: 4 mg | ORAL | Qty: 1

## 2017-09-09 MED FILL — OXYCODONE-ACETAMINOPHEN 5-325 MG PO TABS: 5-325 mg | ORAL | Qty: 1

## 2017-09-09 MED FILL — GABAPENTIN 100 MG PO CAPS: 100 mg | ORAL | Qty: 2

## 2017-09-09 MED FILL — PANTOPRAZOLE SODIUM 40 MG PO TBEC: 40 mg | ORAL | Qty: 1

## 2017-09-09 MED FILL — HUMALOG 100 UNIT/ML SC SOLN: 100 [IU]/mL | SUBCUTANEOUS | Qty: 300

## 2017-09-09 MED FILL — HYDROCODONE-ACETAMINOPHEN 5-325 MG PO TABS: 5-325 mg | ORAL | Qty: 2

## 2017-09-09 MED FILL — SODIUM CHLORIDE 0.9 % IV SOLN: 0.9 % | INTRAVENOUS | Qty: 1000

## 2017-09-09 MED FILL — LANTUS 100 UNIT/ML SC SOLN: 100 [IU]/mL | SUBCUTANEOUS | Qty: 20

## 2017-09-09 MED FILL — VENTOLIN HFA 108 (90 BASE) MCG/ACT IN AERS: 108 (90 Base) MCG/ACT | RESPIRATORY_TRACT | Qty: 8

## 2017-09-09 MED FILL — LIPITOR 20 MG PO TABS: 20 mg | ORAL | Qty: 1

## 2017-09-09 MED FILL — HUMALOG 100 UNIT/ML SC SOLN: 100 [IU]/mL | SUBCUTANEOUS | Qty: 1

## 2017-09-09 MED FILL — ENOXAPARIN SODIUM 30 MG/0.3ML SC SOLN: 30 MG/0.3ML | SUBCUTANEOUS | Qty: 0.3

## 2017-09-09 MED FILL — TAMSULOSIN HCL 0.4 MG PO CAPS: 0.4 mg | ORAL | Qty: 1

## 2017-09-09 MED FILL — DULOXETINE HCL 30 MG PO CPEP: 30 mg | ORAL | Qty: 2

## 2017-09-09 MED FILL — ASPIRIN 81 MG PO CHEW: 81 mg | ORAL | Qty: 1

## 2017-09-09 NOTE — Discharge Summary (Addendum)
Discharge Summary    Name:  Ricardo Foley DOB/Age/Sex: October 04, 1960  (57 y.o. male)   MRN & CSN:  [id number] & [id number] Admission Date/Time: 09/08/2017  6:17 PM   Attending:  No att. providers found Discharging Physician: Regino Bellow, MD     HPI:     Per H&P:  Ricardo Foley is a 57 y.o. male who felt sick for the past 5 days with abdo pain with emesis associated with polyuria and sugar measurements of 500s developed syncope this afternoon. He was out by the porch and when he got up, he described felling flushed from head to toe and passout. He was caught by his wife and brother in law who prevented him from falling to the ground. In the ED, he was found to be in AKI and with low BP in the 90s SBP that is responsive to 2 L IVF hydration.      He reports that last week, he was in [facility] for "clogged arteries from his pelvis to his knees".    Hospital Course:     Ricardo Foley is a pleasant 57 year old who presented to ER with abdominal pain with nausea and vomiting for 2 days.  He was walking into his house when he felt a "total body rush for my head to my toes" and then passed out briefly.  He did not hit his head.  He presented to ER and was admitted.    He was treated for an acute kidney injury while here likely due to hypovolemia.  He was hydrated overnight with improvement in his renal function.     The syncopal episode is likely due to hypovolemia in the setting of N/V. At the time of discharge orthostatics were checked and came back negative.  He has PAD and a planned bypass at [facility].  He has bilateral SFA occlusions per very recent angiogram.  A carotid US is to be done outpatient.    Regarding the abdominal pain with N/V, lipase level was 195.  He has a history of pancreatitis he states.  He may have had an episode of mild acute pancreatitis.  He states he quit drinking many years ago and does not drink at all at this time. Not even when drink.  He does smoke.  He was advised to stop  smoking as smoking cigarettes is a risk factor for pancreatitis.  He was feeling great today.  He really wanted to go home.  He was tolerating solid food.  I advised him to follow up with his GI nurse practitioner at [facility] gastroenterology this week.     Consider gallbladder US as outpatient.  There was on biliary issue on CT at this time.     Of note he is positive for cannabinoids. He stated he has been using pot for 40 years.  He was taking hot showers to help the abdominal pain and N/V but they were not helping.      He was released in stable condition today. He has chronic back pain and is being treated by Dr. Lyndel Safe in [place].  He was having some LLQ pain while here which he attributed to sleeping on his stomach since he had recent injections in his back.  Pain level was about 5/10 when I saw him. He asked medication, and I gave him a small supply of Norco.  The most recent Norco script was from [place] after his release per [facility] report.     The patient expressed appropriate  understanding of and agreement with the discharge recommendations, medications, and plan.     Consults this admission:  IP CONSULT TO HOSPITALIST  IP CONSULT TO ENDOCRINOLOGY    Discharge Instruction:   Follow up appointments: GI, vascular surgery  Primary care physician:  within 2 weeks    Diet:  diabetic diet   Activity: activity as tolerated  Disposition: Discharged to:   [x] Home, [] HHC, [] SNF, [] Acute Rehab, [] Hospice  Condition on discharge: Stable    Discharge Medications:      [name], [name]   Home Medication Instructions GUY:[id number]    Printed on:09/10/17 0356   Medication Information                      albuterol sulfate HFA 108 (90 Base) MCG/ACT inhaler  Inhale 2 puffs into the lungs every 6 hours as needed for Wheezing             aspirin 81 MG chewable tablet  Take 1 tablet by mouth daily             atorvastatin (LIPITOR) 20 MG tablet  Take 20 mg by mouth daily             B Complex-C-Folic Acid (DIALYVITE  TABLET) TABS  Take 1 tablet by mouth daily             docusate sodium (COLACE) 100 MG capsule  Take 1 capsule by mouth daily as needed              DULoxetine (CYMBALTA) 60 MG extended release capsule  Take 60 mg by mouth daily             gabapentin (NEURONTIN) 400 MG capsule  Take 400 mg by mouth 3 times daily.             HYDROcodone-acetaminophen (NORCO) 5-325 MG per tablet  Take 1-2 tablets by mouth every 8 hours as needed for Pain for up to 3 days.             hydrOXYzine (VISTARIL) 50 MG capsule  Take 50 mg by mouth 3 times daily as needed for Itching             lisinopril (PRINIVIL;ZESTRIL) 20 MG tablet  Take 20 mg by mouth daily             Magnesium Oxide 400 MG CAPS  Take 1 capsule by mouth daily for 14 days             metFORMIN (GLUCOPHAGE) 1000 MG tablet  Take 1,000 mg by mouth 2 times daily (with meals)             ondansetron (ZOFRAN ODT) 4 MG disintegrating tablet  Take 1 tablet by mouth every 8 hours as needed for Nausea or Vomiting             pantoprazole (PROTONIX) 40 MG tablet  Take 40 mg by mouth 2 times daily             tamsulosin (FLOMAX) 0.4 MG capsule  Take 0.4 mg by mouth daily             tiZANidine (ZANAFLEX) 4 MG tablet  Take 4 mg by mouth 2 times daily                  Objective Findings at Discharge:   BP 120/61    Pulse 66    Temp 98.6 ??F (37 ??C) (Oral)  Resp 18    Ht 5\' 6"  (1.676 m)    Wt 180 lb 9.6 oz (81.9 kg)    SpO2 97%    BMI 29.15 kg/m??            PHYSICAL EXAM   GEN Awake male, sitting upright in bed in no apparent distress. Appears given age.      LABS:    CBC:   Lab Results   Component Value Date    WBC 22.3 09/09/2017    HGB 10.9 09/09/2017    HCT 34.7 09/09/2017    MCV 88.3 09/09/2017    PLT 405 09/09/2017     BMP:   Lab Results   Component Value Date    NA 135 09/09/2017    K 4.9 09/09/2017    CL 105 09/09/2017    CO2 21 09/09/2017    BUN 35 09/09/2017    CREATININE 1.5 09/09/2017    CALCIUM 8.5 09/09/2017     Hepatic:   Recent Labs     09/08/17  1843   AST 10*    ALT 13   BILITOT 0.3   ALKPHOS 70        IMAGING:    CT ABDOMEN PELVIS WO CONTRAST Additional Contrast? None [[id number]] Collected: 09/08/17 2121      Order Status: Completed Updated: 09/09/17 0853     Narrative:       EXAMINATION:  CT OF THE ABDOMEN AND PELVIS WITHOUT CONTRAST 09/08/2017 7:39 pm    TECHNIQUE:  CT of the abdomen and pelvis was performed without the administration of  intravenous contrast. Multiplanar reformatted images are provided for review.  Dose modulation, iterative reconstruction, and/or weight based adjustment of  the mA/kV was utilized to reduce the radiation dose to as low as reasonably  achievable.    COMPARISON:  05/31/2017    HISTORY:  ORDERING SYSTEM PROVIDED HISTORY: abd pain/syncope  TECHNOLOGIST PROVIDED HISTORY:  Additional Contrast?->None    FINDINGS:  Lower Chest: No acute infiltrate at the lung bases.    Organs: The liver, spleen, pancreas and adrenal glands are unremarkable.  No  acute biliary findings.  No renal or ureteral calculi or significant  hydronephrosis.    GI/Bowel: Scattered colonic diverticulosis with no CT evidence of  diverticulitis.  Mild retained gas and stool in the proximal colon.  The  appendix is unremarkable.  No small bowel distension.  The stomach and  duodenal C-loop are intact.    Pelvis: There is no pelvic mass or free pelvic fluid.  The prostate is not  enlarged.  Partial distention of the urinary bladder.    Peritoneum/Retroperitoneum: The abdominal aorta is normal in caliber with  mild calcified atherosclerotic plaque.  No retroperitoneal adenopathy or  upper abdominal ascites.    Bones/Soft Tissues: No acute osseous or soft tissue abnormality.     Impression:       No acute findings within the abdomen or pelvis.  No evidence of obstructive  uropathy or appendicitis.  Colonic diverticulosis with no acute features.     CT CERVICAL SPINE WO CONTRAST [[id number]] Collected: 09/08/17 2118     Order Status: Completed Updated: 09/08/17 2124     Narrative:        EXAMINATION:  CT OF THE CERVICAL SPINE WITHOUT CONTRAST 09/08/2017 7:39 pm    TECHNIQUE:  CT of the cervical spine was performed without the administration of  intravenous contrast. Multiplanar reformatted images are provided for review.  Dose modulation, iterative reconstruction, and/or  weight based adjustment of  the mA/kV was utilized to reduce the radiation dose to as low as reasonably  achievable.    COMPARISON:  None.    HISTORY:  ORDERING SYSTEM PROVIDED HISTORY: fall    FINDINGS:  BONES/ALIGNMENT: There is no evidence of an acute cervical spine fracture.  2  mm anterolisthesis of C4 on C5 likely related to facet arthropathy.  Vertebral alignment is otherwise maintained.    DEGENERATIVE CHANGES: Degenerative disc disease at C5-6 and C6-7 with mild  degenerative changes at C4-5.  Multilevel facet arthropathy.  The C1-2  articulation is maintained.    SOFT TISSUES: There is no prevertebral soft tissue swelling.     Impression:       No acute abnormality of the cervical spine.    Multilevel degenerative disc disease and facet arthropathy as outlined above.     CT Head WO Contrast [[id number]] Collected: 09/08/17 2116     Order Status: Completed Updated: 09/08/17 2121     Narrative:       EXAMINATION:  CT OF THE HEAD WITHOUT CONTRAST  09/08/2017 7:39 pm    TECHNIQUE:  CT of the head was performed without the administration of intravenous  contrast. Dose modulation, iterative reconstruction, and/or weight based  adjustment of the mA/kV was utilized to reduce the radiation dose to as low  as reasonably achievable.    COMPARISON:  None.    HISTORY:  ORDERING SYSTEM PROVIDED HISTORY: HEAD TRAUMA, CLOSED, MILD, GCS >= 13, NO  RISK FACTORS, NEURO EXAM NORMAL  TECHNOLOGIST PROVIDED HISTORY:  Has a "code stroke" or "stroke alert" been called?->No    FINDINGS:  BRAIN/VENTRICLES: There is no acute intracranial hemorrhage, mass effect or  midline shift.  No abnormal extra-axial fluid collection.  The  gray-white  differentiation is maintained without evidence of an acute infarct.  There is  no evidence of hydrocephalus. Mild decreased attenuation in the  periventricular and subcortical white matter consistent with small vessel  ischemic change.  Intracranial atherosclerotic vascular calcification.    ORBITS: The visualized portion of the orbits demonstrate no acute abnormality.    SINUSES: Mucosal thickening in the sphenoid sinuses and frontal sinuses.  The  mastoid air cells are clear.    SOFT TISSUES/SKULL:  No acute abnormality of the visualized skull or soft  tissues.     Impression:       No acute intracranial abnormality.    Mild chronic small vessel white matter ischemic changes.     XR CHEST PORTABLE [[id number]] Collected: 09/08/17 2016     Order Status: Completed Updated: 09/08/17 2020     Narrative:       EXAMINATION:  ONE XRAY VIEW OF THE CHEST    09/08/2017 7:18 pm    COMPARISON:  07/31/2017.    HISTORY:  ORDERING SYSTEM PROVIDED HISTORY: syncope  TECHNOLOGIST PROVIDED HISTORY:  Reason for exam:->syncope  Reason for Exam: syncope    FINDINGS:  The cardiomediastinal silhouette is unremarkable.  The lungs are clear.  No  infiltrate, pleural fluid or evidence of overt failure.  No acute osseous  findings.     Impression:       No acute cardiopulmonary disease.         Discharge Time of 35 minutes    Electronically signed by Regino Bellow, MD on 09/10/2017 at 3:56 AM

## 2017-09-09 NOTE — Discharge Instructions (Signed)
Follow up with Dr. Evon Slack for elevated lipase level -  You may have had mild pancreatitis  Do not take lisinopril today, start it tomorrow  Stay well - hydrated

## 2017-09-09 NOTE — Plan of Care (Signed)
Problem: Health Behavior:  Goal: Compliance with treatment plan for underlying cause of condition will improve  Description  Compliance with treatment plan for underlying cause of condition will improve  Outcome: Ongoing  Goal: Identification of resources available to assist in meeting health care needs will improve  Description  Identification of resources available to assist in meeting health care needs will improve  Outcome: Ongoing     Problem: Fluid Volume:  Goal: Maintenance of adequate hydration will improve  Description  Maintenance of adequate hydration will improve  Outcome: Ongoing     Problem: Urinary Elimination:  Goal: Skin integrity will improve  Description  Skin integrity will improve  Outcome: Ongoing  Goal: Ability to recognize the need to void and respond appropriately will improve  Description  Ability to recognize the need to void and respond appropriately will improve  Outcome: Ongoing  Goal: Will remain free from infection  Description  Will remain free from infection  Outcome: Ongoing  Goal: Incidence of incontinence will decrease  Description  Incidence of incontinence will decrease  Outcome: Ongoing

## 2017-09-09 NOTE — Plan of Care (Signed)
Problem: Health Behavior:  Goal: Compliance with treatment plan for underlying cause of condition will improve  Description  Compliance with treatment plan for underlying cause of condition will improve  09/09/2017 0411 by Selina Cooley, LPN  Outcome: Ongoing  09/09/2017 0402 by Selina Cooley, LPN  Outcome: Ongoing  Goal: Identification of resources available to assist in meeting health care needs will improve  Description  Identification of resources available to assist in meeting health care needs will improve  09/09/2017 0411 by Selina Cooley, LPN  Outcome: Ongoing  09/09/2017 0402 by Selina Cooley, LPN  Outcome: Ongoing     Problem: Fluid Volume:  Goal: Maintenance of adequate hydration will improve  Description  Maintenance of adequate hydration will improve  09/09/2017 0411 by Selina Cooley, LPN  Outcome: Ongoing  09/09/2017 0402 by Selina Cooley, LPN  Outcome: Ongoing     Problem: Urinary Elimination:  Goal: Skin integrity will improve  Description  Skin integrity will improve  09/09/2017 0411 by Selina Cooley, LPN  Outcome: Ongoing  09/09/2017 0402 by Selina Cooley, LPN  Outcome: Ongoing  Goal: Ability to recognize the need to void and respond appropriately will improve  Description  Ability to recognize the need to void and respond appropriately will improve  09/09/2017 0411 by Selina Cooley, LPN  Outcome: Ongoing  09/09/2017 0402 by Selina Cooley, LPN  Outcome: Ongoing  Goal: Will remain free from infection  Description  Will remain free from infection  09/09/2017 0411 by Selina Cooley, LPN  Outcome: Ongoing  09/09/2017 0402 by Selina Cooley, LPN  Outcome: Ongoing  Goal: Incidence of incontinence will decrease  Description  Incidence of incontinence will decrease  09/09/2017 0411 by Selina Cooley, LPN  Outcome: Ongoing  09/09/2017 0402 by Selina Cooley, LPN  Outcome: Ongoing

## 2017-09-10 LAB — CULTURE, URINE: Culture: NO GROWTH

## 2017-09-14 LAB — EKG 12-LEAD
Atrial Rate: 82 {beats}/min
Diagnosis: NORMAL
P Axis: 49 degrees
P-R Interval: 126 ms
Q-T Interval: 388 ms
QRS Duration: 86 ms
QTc Calculation (Bazett): 453 ms
R Axis: -22 degrees
T Axis: 74 degrees
Ventricular Rate: 82 {beats}/min

## 2017-09-14 LAB — CULTURE BLOOD #1
Culture: NO GROWTH
Culture: NO GROWTH

## 2017-09-19 ENCOUNTER — Inpatient Hospital Stay: Admit: 2017-09-19 | Payer: PRIVATE HEALTH INSURANCE | Primary: Family

## 2017-09-19 DIAGNOSIS — R55 Syncope and collapse: Secondary | ICD-10-CM

## 2017-09-25 ENCOUNTER — Ambulatory Visit
Admit: 2017-09-25 | Discharge: 2017-09-25 | Payer: PRIVATE HEALTH INSURANCE | Attending: Emergency Medicine | Primary: Family

## 2017-09-25 DIAGNOSIS — I2510 Atherosclerotic heart disease of native coronary artery without angina pectoris: Secondary | ICD-10-CM

## 2017-09-25 NOTE — Progress Notes (Signed)
[facility] Ricardo Millers, MD        OFFICE  FOLLOWUP NOTE    Chief complaints: patient is here for management ofchest pain,??DM, HTN, ??DYSLPIDEMIA, PAD, lukemia    Subjective: patient feels better, no chest pain, no shortness of breath, no dizziness, no palpitations    HPI Ricardo Foley is a 57 y.o.year old who  has a past medical history of CAD (coronary artery disease), Chronic back pain, Diabetes ([place]), H/O cardiac catheterization, H/O cardiovascular stress test, H/O Doppler lower arterial ultrasound, H/O echocardiogram, Hyperlipidemia, Hypertension, Nerve pain, PVD (peripheral vascular disease) ([place]), and Scoliosis. and presents for management of chest pain,??DM, HTN, ??DYSLPIDEMIA, PAD, lukemia, which are well controlled    He had LCX stenosis on the cath  Current Outpatient Medications   Medication Sig Dispense Refill   ??? dicyclomine (BENTYL) 20 MG tablet TK 1 T PO QID  3   ??? DULoxetine (CYMBALTA) 60 MG extended release capsule Take 60 mg by mouth daily     ??? gabapentin (NEURONTIN) 400 MG capsule Take 400 mg by mouth 3 times daily.     ??? pantoprazole (PROTONIX) 40 MG tablet Take 40 mg by mouth 2 times daily     ??? B Complex-C-Folic Acid (DIALYVITE TABLET) TABS Take 1 tablet by mouth daily     ??? tamsulosin (FLOMAX) 0.4 MG capsule Take 0.4 mg by mouth daily     ??? albuterol sulfate HFA 108 (90 Base) MCG/ACT inhaler Inhale 2 puffs into the lungs every 6 hours as needed for Wheezing     ??? tiZANidine (ZANAFLEX) 4 MG tablet Take 4 mg by mouth 2 times daily      ??? docusate sodium (COLACE) 100 MG capsule Take 1 capsule by mouth daily as needed      ??? lisinopril (PRINIVIL;ZESTRIL) 20 MG tablet Take 20 mg by mouth daily     ??? ondansetron (ZOFRAN ODT) 4 MG disintegrating tablet Take 1 tablet by mouth every 8 hours as needed for Nausea or Vomiting 20 tablet 0   ??? aspirin 81 MG chewable tablet Take 1 tablet by mouth daily 30 tablet 3   ??? metFORMIN (GLUCOPHAGE) 1000 MG tablet Take 1,000 mg by mouth 2 times daily (with meals)      ??? hydrOXYzine (VISTARIL) 50 MG capsule Take 50 mg by mouth 3 times daily as needed for Itching     ??? atorvastatin (LIPITOR) 20 MG tablet Take 20 mg by mouth daily       No current facility-administered medications for this visit.      Allergies: Patient has no known allergies.  Past Medical History:   Diagnosis Date   ??? CAD (coronary artery disease)    ??? Chronic back pain     Had shots August 2019   ??? Diabetes [facility])     Diagnosed about 2009   ??? H/O cardiac catheterization 08/02/2017     severe single vessel CAD of the distal LCX which is small caliber and not amenable to PCI.   ??? H/O cardiovascular stress test 07/03/2017    scan shows moderate size, severe intensity, non reversible perfusion defect in inferolateral wall, normal LVEF.   ??? H/O Doppler lower arterial ultrasound 07/11/2017    The Right Proximal and mid SFA exhibits an occlusion. The Left SFA exhibits an occlusion between the mid and distal portions. Right ABIs show Severe peripheral arterial disease.   ??? H/O echocardiogram 05/30/2017    Left ventricular systolic function is normal. Ejection fraction is visually  estimated at >55%.   ??? Hyperlipidemia    ??? Hypertension    ??? Nerve pain    ??? PVD (peripheral vascular disease) ([place]) 08/02/2017     Severe lower extremity PAD. See abn Peripherial Angio. ( ABN Arterial US.07/11/17)   ??? Scoliosis      Past Surgical History:   Procedure Laterality Date   ??? PR SONO GUIDE NEEDLE BIOPSY       Family History   Problem Relation Age of Onset   ??? Stroke Sister    ??? Heart Attack Brother      Social History     Tobacco Use   ??? Smoking status: Current Every Day Smoker     Packs/day: 1.50     Types: Cigarettes   ??? Smokeless tobacco: Never Used   Substance Use Topics   ??? Alcohol use: No      @ROSAKH @  Review of Systems:   ?? Constitutional: No Fever or Weight Loss   ?? Eyes: No Decreased Vision  ?? ENT: No Headaches, Hearing Loss or Vertigo  ?? Cardiovascular: No chest pain, dyspnea on exertion, palpitations or loss of  consciousness  ?? Respiratory: No cough or wheezing    ?? Gastrointestinal: No abdominal pain, appetite loss, blood in stools, constipation, diarrhea or heartburn  ?? Genitourinary: No dysuria, trouble voiding, or hematuria  ?? Musculoskeletal:  No gait disturbance, weakness or joint complaints  ?? Integumentary: No rash or pruritis  ?? Neurological: No TIA or stroke symptoms  ?? Psychiatric: No anxiety or depression  ?? Endocrine: No malaise, fatigue or temperature intolerance  ?? Hematologic/Lymphatic: No bleeding problems, blood clots or swollen lymph nodes  ?? Allergic/Immunologic: No nasal congestion or hives  All systems negative except as marked.   Objective:  BP 112/60    Pulse 60    Ht 5\' 6"  (1.676 m)    Wt 180 lb 9.6 oz (81.9 kg)    BMI 29.15 kg/m??   Wt Readings from Last 3 Encounters:   09/25/17 180 lb 9.6 oz (81.9 kg)   09/08/17 180 lb 9.6 oz (81.9 kg)   08/27/17 181 lb (82.1 kg)     Body mass index is 29.15 kg/m??.  GENERAL - Alert, oriented, pleasant, in no apparent distress,normal grooming  HEENT - pupils are reactive to light and accomodation, cornea intact, conjunctive normal color, ears are normal in exam,throat exam in normal, teeth, gum and palate are normal, oral mucosa is normal without any notation of pallor or cyanosis  Neck - Supple.  No jugular venous distention noted. No carotid bruits, no apical -carotid delay  Respiratory:  Normal breath sounds, No respiratory distress, No wheezing, No chest tenderness.,no use of accessory muscles, diaphragm movement is normal  Cardiovascular: (PMI) apex non displaced,no lifts no thrills, no s3,no s4, Normal heart rate, Normal rhythm, No murmurs, No rubs, No gallops. Carotid arteries pulse and amplitude are normal no bruit, no abdominal bruit noted ( normal abdominal aorta ausculation), femoral arteries pulse and amplitude are normal no bruit, pedal pulses are normal  Femoral pulses have normal amplitude, no bruits   Extremities - No cyanosis, clubbing, or  significant edema, no varicose veins    Abdomen - No masses, tenderness, or organomegaly, no hepato-splenomegally, no bruits  Musculoskeletal - No significant edema, no kyphosis or scoliosis, no deformity in any extremity noted, muscle strength and tone are normal  Skin: no ulcer,no scar,no stasis dermatitis   Neurologic - alert oriented times 3,Cranial nerves II through XII are  grossly intact.  There were no gross focal neurologic abnormalities. All sensory and motor nerves examined and were normal  Psychiatric: normal mood and affect    Lab Results   Component Value Date    CKTOTAL 58 09/08/2017     BNP:  No results found for: BNP  PT/INR:  No results found for: PTINR  Lab Results   Component Value Date    LABA1C 6.6 (H) 09/09/2017    LABA1C 7.2 (H) 05/31/2017     Lab Results   Component Value Date    CHOL 115 03/12/2017    TRIG 186 (H) 03/12/2017    HDL 43 03/12/2017     Lab Results   Component Value Date    ALT 13 09/08/2017    AST 10 (L) 09/08/2017     TSH:    Lab Results   Component Value Date    TSH 1.42 03/12/2017       Impression:  Ricardo Foley is a 57 y.o.year old who  has a past medical history of CAD (coronary artery disease), Chronic back pain, Diabetes ([place]), H/O cardiac catheterization, H/O cardiovascular stress test, H/O Doppler lower arterial ultrasound, H/O echocardiogram, Hyperlipidemia, Hypertension, Nerve pain, PVD (peripheral vascular disease) ([place]), and Scoliosis. and presents with     Plan:  1. Presncope" his blood glucose was > 600 he was seen in ED, he had marijua and oxycodone used also,  2. Chest pain:distal LCX STENOSIS, medical treament is recommended  3. PAD: as per vascular sx, as per pateint he was taken off cath table at [facility] since he did not get clearance  4. PAIN in neck, back and legs: as per pain clinic  5. LUKEMIA, AS PER ONCOLOGIST  6. HTN: controlled,??continue ??Lisinopril  7. DM: CONTINUE insulin  8. Dyslipidemia; stable, continue statins  9. All labs, medications  and tests reviewed, continue all other medications of all above medical condition listed as is.

## 2017-10-01 ENCOUNTER — Encounter: Attending: Rehabilitative and Restorative Service Providers" | Primary: Family

## 2017-11-09 ENCOUNTER — Emergency Department: Admit: 2017-11-09 | Payer: PRIVATE HEALTH INSURANCE | Primary: Family

## 2017-11-09 ENCOUNTER — Inpatient Hospital Stay
Admit: 2017-11-09 | Discharge: 2017-11-10 | Disposition: A | Discharge status: Other Acute Inpatient Hospital | Payer: PRIVATE HEALTH INSURANCE | Attending: Emergency Medicine

## 2017-11-09 DIAGNOSIS — M726 Necrotizing fasciitis: Principal | ICD-10-CM

## 2017-11-09 LAB — CBC WITH AUTO DIFFERENTIAL
Basophils %: 0.6 % (ref 0–1)
Basophils Absolute: 0.1 10*3/uL
Eosinophils %: 2.1 % (ref 0–3)
Eosinophils Absolute: 0.5 10*3/uL
Hematocrit: 38.4 % — ABNORMAL LOW (ref 42–52)
Hemoglobin: 11.8 GM/DL — ABNORMAL LOW (ref 13.5–18.0)
Immature Neutrophil %: 0.7 % — ABNORMAL HIGH (ref 0–0.43)
Lymphocytes %: 35.9 % (ref 24–44)
Lymphocytes Absolute: 8.8 10*3/uL
MCH: 26.6 PG — ABNORMAL LOW (ref 27–31)
MCHC: 30.7 % — ABNORMAL LOW (ref 32.0–36.0)
MCV: 86.7 FL (ref 78–100)
MPV: 9.5 FL (ref 7.5–11.1)
Monocytes %: 3.3 % (ref 0–4)
Monocytes Absolute: 0.8 10*3/uL
Neutrophils Absolute: 14 10*3/uL
Nucleated RBC %: 0 %
Platelets: 528 10*3/uL — ABNORMAL HIGH (ref 140–440)
RBC: 4.43 10*6/uL — ABNORMAL LOW (ref 4.6–6.2)
RDW: 15.9 % — ABNORMAL HIGH (ref 11.7–14.9)
Segs Relative: 57.4 % (ref 36–66)
Total Immature Neutrophil: 0.16 10*3/uL
Total Nucleated RBC: 0 10*3/uL
WBC: 24.4 10*3/uL — ABNORMAL HIGH (ref 4.0–10.5)

## 2017-11-09 LAB — COMPREHENSIVE METABOLIC PANEL
ALT: 10 U/L (ref 10–40)
AST: 11 IU/L — ABNORMAL LOW (ref 15–37)
Albumin: 4.5 GM/DL (ref 3.4–5.0)
Alkaline Phosphatase: 77 IU/L (ref 40–129)
Anion Gap: 13 (ref 4–16)
BUN: 11 MG/DL (ref 6–23)
CO2: 22 MMOL/L (ref 21–32)
Calcium: 9 MG/DL (ref 8.3–10.6)
Chloride: 104 mMol/L (ref 99–110)
Creatinine: 0.8 MG/DL — ABNORMAL LOW (ref 0.9–1.3)
GFR African American: >60 mL/min/{1.73_m2} (ref >60)
GFR Non-African American: >60 mL/min/{1.73_m2} (ref >60)
Glucose: 192 MG/DL — ABNORMAL HIGH (ref 70–99)
Potassium: 5.5 MMOL/L (ref 3.5–5.1)
Sodium: 139 MMOL/L (ref 135–145)
Total Bilirubin: 0.2 MG/DL (ref 0.0–1.0)
Total Protein: 7.6 GM/DL (ref 6.4–8.2)

## 2017-11-09 LAB — LACTIC ACID
Lactate: 2.7 mMOL/L (ref 0.4–2.0)
Lactate: 2.8 mMOL/L (ref 0.4–2.0)

## 2017-11-09 LAB — URINALYSIS
Bacteria, UA: NEGATIVE /HPF
Bilirubin Urine: NEGATIVE MG/DL
Blood, Urine: NEGATIVE
Glucose, Urine: NEGATIVE MG/DL
Hyaline Casts, UA: 0 /LPF
Leukocyte Esterase, Urine: NEGATIVE
Nitrite Urine, Quantitative: NEGATIVE
Protein, UA: NEGATIVE MG/DL
RBC, UA: NONE SEEN /HPF (ref 0–3)
Specific Gravity, UA: 1.024 (ref 1.001–1.035)
Trichomonas: NONE SEEN /[HPF]
Urobilinogen, Urine: NORMAL MG/DL (ref 0.2–1.0)
WBC, UA: <1 /HPF (ref 0–2)
pH, Urine: 5 (ref 5.0–8.0)

## 2017-11-09 LAB — LIPASE: Lipase: 31 IU/L (ref 13–60)

## 2017-11-09 MED ORDER — VANCOMYCIN HCL 1 G IV SOLR
1 g | Freq: Once | INTRAVENOUS | Status: AC
Start: 2017-11-09 — End: 2017-11-09
  Administered 2017-11-09: 22:00:00 1750 mg/kg via INTRAVENOUS

## 2017-11-09 MED ORDER — CLINDAMYCIN 150 MG/ML IJ SOLN (MIXTURES ONLY)
300 MG/2ML | Freq: Once | INTRAVENOUS | Status: AC
Start: 2017-11-09 — End: 2017-11-09
  Administered 2017-11-10: 900 mg via INTRAVENOUS

## 2017-11-09 MED ORDER — SODIUM CHLORIDE 0.9 % IV SOLN
0.9 % | INTRAVENOUS | Status: DC
Start: 2017-11-09 — End: 2017-11-10
  Administered 2017-11-09: 1000 mL via INTRAVENOUS

## 2017-11-09 MED ORDER — IOPAMIDOL 76 % IV SOLN
76 % | Freq: Once | INTRAVENOUS | Status: AC | PRN
Start: 2017-11-09 — End: 2017-11-09
  Administered 2017-11-09: 20:00:00 100 mL via INTRAVENOUS

## 2017-11-09 MED ORDER — NORMAL SALINE FLUSH 0.9 % IV SOLN
0.9 % | Freq: Once | INTRAVENOUS | Status: AC | PRN
Start: 2017-11-09 — End: 2017-11-09
  Administered 2017-11-09: 20:00:00 10 mL via INTRAVENOUS

## 2017-11-09 MED ORDER — DEXTROSE 5 % IV SOLN (MINI-BAG)
5 % | Freq: Once | INTRAVENOUS | Status: DC
Start: 2017-11-09 — End: 2017-11-09

## 2017-11-09 MED ORDER — CEFEPIME HCL 2 G IJ SOLR
2 g | Freq: Once | INTRAMUSCULAR | Status: AC
Start: 2017-11-09 — End: 2017-11-09
  Administered 2017-11-09: 19:00:00 2 g via INTRAVENOUS

## 2017-11-09 MED ORDER — SODIUM POLYSTYRENE SULFONATE 15 GM/60ML PO SUSP
15 GM/60ML | Freq: Once | ORAL | Status: AC
Start: 2017-11-09 — End: 2017-11-09
  Administered 2017-11-09: 22:00:00 15 g via ORAL

## 2017-11-09 MED ORDER — MORPHINE SULFATE (PF) 4 MG/ML IJ SOLN
4 MG/ML | Freq: Once | INTRAMUSCULAR | Status: AC
Start: 2017-11-09 — End: 2017-11-09
  Administered 2017-11-09: 19:00:00 4 mg via INTRAVENOUS

## 2017-11-09 MED ORDER — SODIUM CHLORIDE 0.9 % IV BOLUS
0.9 % | Freq: Once | INTRAVENOUS | Status: AC
Start: 2017-11-09 — End: 2017-11-09
  Administered 2017-11-09: 19:00:00 1000 mL via INTRAVENOUS

## 2017-11-09 MED ORDER — MORPHINE SULFATE (PF) 4 MG/ML IJ SOLN
4 MG/ML | INTRAMUSCULAR | Status: DC | PRN
Start: 2017-11-09 — End: 2017-11-10
  Administered 2017-11-09 (×2): 4 mg via INTRAVENOUS

## 2017-11-09 MED ORDER — ONDANSETRON HCL 4 MG/2ML IJ SOLN
4 MG/2ML | INTRAMUSCULAR | Status: AC | PRN
Start: 2017-11-09 — End: 2017-11-09
  Administered 2017-11-09 (×3): 4 mg via INTRAVENOUS

## 2017-11-09 MED FILL — MORPHINE SULFATE 4 MG/ML IJ SOLN: 4 mg/mL | INTRAMUSCULAR | Qty: 1

## 2017-11-09 MED FILL — CEFEPIME HCL 2 G IJ SOLR: 2 g | INTRAMUSCULAR | Qty: 2

## 2017-11-09 MED FILL — ISOVUE-370 76 % IV SOLN: 76 % | INTRAVENOUS | Qty: 100

## 2017-11-09 MED FILL — SPS 15 GM/60ML PO SUSP: 15 GM/60ML | ORAL | Qty: 60

## 2017-11-09 MED FILL — ONDANSETRON HCL 4 MG/2ML IJ SOLN: 4 MG/2ML | INTRAMUSCULAR | Qty: 2

## 2017-11-09 MED FILL — CLEOCIN PHOSPHATE 900 MG/6ML IJ SOLN: 900 MG/6ML | INTRAMUSCULAR | Qty: 6

## 2017-11-09 MED FILL — SODIUM CHLORIDE 0.9 % IV SOLN: 0.9 % | INTRAVENOUS | Qty: 1000

## 2017-11-09 MED FILL — VANCOMYCIN HCL 5 G IV SOLR: 5 g | INTRAVENOUS | Qty: 1750

## 2017-11-09 NOTE — ED Notes (Signed)
[facility]  11/09/17 2132

## 2017-11-09 NOTE — ED Notes (Signed)
Wounds cultures obtained and wet to dry dressing applied to right lower leg and dry dressing applied to right groin secured with paper tape. Patient given a urinal to obtain sample. Stated he does not have to go right now. Patient stating he feels no better than he did prior to pain and nausea medications.     Haywood Filler. Burleigh Brockmann, RN  11/09/17 1556

## 2017-11-09 NOTE — ED Notes (Signed)
Patient to radiology via cart.     Iantha Fallen. Jaquawn Saffran, RN  11/09/17 1627

## 2017-11-09 NOTE — Discharge Instructions (Signed)
Continuity of Care Form    Patient Name: Ricardo Foley   DOB:  [birth year]  MRN:  [id number]    Admit date:  11/09/2017  Discharge date:  ***    Code Status Order: Prior   Advance Directives:     Admitting Physician:  No admitting provider for patient encounter.  PCP: Hazel Sams, APRN - CNP    Discharging Nurse: [facility] Unit/Room#: OTF-05/OTF-5  Discharging Unit Phone Number: ***    Emergency Contact:   Extended Emergency Contact Information  Primary Emergency Contact: [place], [place] Phone: [phone]  Mobile Phone: [phone]  Relation: Other  Interpreter needed? No  Secondary Emergency Contact: Rieske,Tori  Home Phone: [phone]  Mobile Phone: [phone]  Relation: Child    Past Surgical History:  Past Surgical History:   Procedure Laterality Date   ??? PR SONO GUIDE NEEDLE BIOPSY         Immunization History:     There is no immunization history on file for this patient.    Active Problems:  Patient Active Problem List   Diagnosis Code   ??? Chest pain R07.9   ??? Lower abdominal pain R10.30   ??? Anemia D64.9   ??? Abnormal stress test R94.39   ??? PAD (peripheral artery disease) (HCC) I73.9   ??? AKI (acute kidney injury) ([place]) N17.9   ??? Leukocytosis - chronic - patient reports CLL D72.829       Isolation/Infection:   Isolation          No Isolation            Nurse Assessment:  Last Vital Signs: BP (!) 125/57    Pulse 79    Temp 97.8 ??F (36.6 ??C) (Oral)    Resp 16    Ht 5\' 10"  (1.778 m)    Wt 190 lb (86.2 kg)    SpO2 98%    BMI 27.26 kg/m??     Last documented pain score (0-10 scale): Pain Level: 7  Last Weight:   Wt Readings from Last 1 Encounters:   11/09/17 190 lb (86.2 kg)     Mental Status:  {IP PT MENTAL STATUS:20030}    IV Access:  {MH COC IV ACCESS:304088262}    Nursing Mobility/ADLs:  Walking   {CHP DME PZWC:585277824}  Transfer  {CHP DME MPNT:614431540}  Bathing  {CHP DME GQQP:619509326}  Dressing  {CHP DME ZTIW:580998338}  Toileting  {CHP DME SNKN:397673419}  Feeding  {CHP  DME FXTK:240973532}  Med Admin  {CHP DME DJME:268341962}  Med Delivery   {MH COC MED Delivery:304088264}    Wound Care Documentation and Therapy:        Elimination:  Continence:   ?? Bowel: {YES / IW:97989}  ?? Bladder: {YES / QJ:19417}  Urinary Catheter: {Urinary Catheter:304088013}   Colostomy/Ileostomy/Ileal Conduit: {YES / EY:81448}       Date of Last BM: ***    Intake/Output Summary (Last 24 hours) at 11/09/2017 2239  Last data filed at 11/09/2017 2214  Gross per 24 hour   Intake 1977.5 ml   Output --   Net 1977.5 ml     No intake/output data recorded.    Safety Concerns:     [employer] COC Safety Concerns:304088272}    Impairments/Disabilities:      [place] COC Impairments/Disabilities:304088273}    Nutrition Therapy:  Current Nutrition Therapy:   [place] COC Diet List:304088271}    Routes of Feeding: {CHP DME Other Feedings:304088042}  Liquids: {Slp liquid thickness:30034}  Daily Fluid  Restriction: {CHP DME Yes amt example:304088041}  Last Modified Barium Swallow with Video (Video Swallowing Test): {Done Not Done ALPF:790240973}    Treatments at the Time of Hospital Discharge:   Respiratory Treatments: ***  Oxygen Therapy:  {Therapy; copd oxygen:17808}  Ventilator:    {MH CC Vent ZHGD:924268341}    Rehab Therapies: {THERAPEUTIC [phone]}  Weight Bearing Status/Restrictions: {MH CC Weight Bearing:304508812}  Other Medical Equipment (for information only, NOT a DME order):  {EQUIPMENT:304520077}  Other Treatments: ***    Patient's personal belongings (please select all that are sent with patient):  {CHP DME Belongings:304088044}    RN SIGNATURE:  {Esignature:304088025}    CASE MANAGEMENT/SOCIAL WORK SECTION    Inpatient Status Date: ***    Readmission Risk Assessment Score:  Readmission Risk              Risk of Unplanned Readmission:        0           Discharging to Facility/ Agency   ?? Name:   ?? Address:  ?? Phone:  ?? Fax:    Dialysis Facility (if applicable)   ?? Name:  ?? Address:  ?? Dialysis  Schedule:  ?? Phone:  ?? Fax:    Case Manager/Social Worker signature: {Esignature:304088025}    PHYSICIAN SECTION    Prognosis: {Prognosis:[phone]}    Condition at Discharge: [place] Patient Condition:304088024}    Rehab Potential (if transferring to Rehab): {Prognosis:[phone]}    Recommended Labs or Other Treatments After Discharge: ***    Physician Certification: I certify the above information and transfer of KEVIN SPACE  is necessary for the continuing treatment of the diagnosis listed and that he requires {Admit to Appropriate Level of Care:20763} for {GREATER/LESS:304500278} 30 days.     Update Admission H&P: {CHP DME Changes in DQQIW:979892119}    PHYSICIAN SIGNATURE:  {Esignature:304088025}

## 2017-11-09 NOTE — ED Provider Notes (Signed)
I independently examined and evaluated Ricardo Foley.    In brief, patient with history of CAD and severe PAD with recent stenting presents with acute onset abdominal pain that began today.  Patient is a diffuse abdominal pain with vomiting and states he has been able to keep anything down.    Focused exam revealed abdomen is soft, mild diffuse tenderness to palpation without rebound or guarding.  Wounds from prior stenting to legs without evidence of infection    ED course: Started on broad-spectrum antibiotics including bank, cefepime, and Clindamycin was added for possible nec fasc.  He was accepted for transfer to [facility].  He remained stable during stay in the emergency department.  ED Course as of Nov 09 2237   Fri Nov 09, 2017   1442 WBC(!): 24.4 [JH]   1442 Lactate, ser/plas(!!): 2.7  LACT CALLED TO Ricardo BRUMFIELD PA ON 11/09/2017 @ 1337 BY JCHETTY MLS   RESULTS READ BACK   [JH]   1442 Potassium(!!): 5.5  K CALLED TO Ricardo BRUMFIELD PA ON 11/09/2017 @ 1337 BY JCHETTY MLS   RESULTS READ BACK   [JH]   2046 Spoke with Dr. Jimmey Ralph, cardiovascular surgery at [facility], who states that this is not his problem given the fact that the surgery was not within 3 days or 1 week.  He will not accept patient for transfer and states that this can all be taken care of here by surgery.    [JH]   2122 Spoke with Dr.Tayim, general surgery at [facility], who will accept the patient for transfer.    [JH]   2206 Potassium: 4.4 [JH]      ED Course User Index  [JH] [place] Shanayah Kaffenberger, MD       All diagnostic, treatment, and disposition decisions were made by myself in conjunction with the advanced practice provider.    For all further details of the patient's emergency department visit, please see the advanced practice provider's documentation.    Comment: Please note this report has been produced using speech recognition software and may contain errors related to that system including errors in grammar, punctuation, and  spelling, as well as words and phrases that may be inappropriate. If there are any questions or concerns please feel free to contact the dictating provider for clarification.         [place] Alynn Ellithorpe, MD  11/17/17 1444

## 2017-11-09 NOTE — ED Provider Notes (Signed)
Patient Identification  Ricardo Foley is a 57 y.o. male    Chief Complaint  Wound Check (right knee wound; states surgery for stent placement on 9/27, wound draining) and Abdominal Pain      HPI  (History provided by patient)  This is a 57 y.o. male who was brought in by self for chief complaint of wound infection, abdominal pain.  Patient reports that he woke up this morning and shortly after waking up he began developing diffuse abdominal pain that is 7 out of 10 without radiation.  Associated with several episodes of bloody nonbilious vomiting.  He also notes that 3 weeks ago he had surgery on his left leg, stents were placed in leg arteries, he was admitted for sepsis and possible infections of these areas, was discharged recently has been on amoxicillin.  He states that since today he has been having drainage from his groin wound and bleeding from his thigh wound and is concerned that both are infected again.  No fevers.  No coughing.  No rashes.        REVIEW OF SYSTEMS    Constitutional:  Denies fever, chills  HENT:  Denies sore throat or ear pain   Eyes: Denies vision changes, eye pain  Cardiovascular:  Denies chest pain, syncope  Respiratory:  Denies shortness of breath, cough   GI:  + abdominal pain, nausea, vomiting  GU:  Denies dysuria, discharge  Musculoskeletal:  Denies back pain,.  + leg pain  Skin:  Denies rash, pruritis.  + wound discharge  Neurologic:  Denies headache, focal weakness, or sensory changes     See HPI and nursing notes for additional information     I have reviewed the following nursing documentation:  Allergies: No Known Allergies    Past medical history:  has a past medical history of CAD (coronary artery disease), Chronic back pain, Diabetes ([place]), H/O cardiac catheterization (08/02/2017), H/O cardiovascular stress test (07/03/2017), H/O Doppler lower arterial ultrasound (07/11/2017), H/O echocardiogram (05/30/2017), Hyperlipidemia, Hypertension, Nerve pain, PVD (peripheral  vascular disease) ([place]) (08/02/2017), and Scoliosis.    Past surgical history:  has a past surgical history that includes pr sono guide needle biopsy.    Home medications:   Prior to Admission medications    Medication Sig Start Date End Date Taking? Authorizing Provider   dicyclomine (BENTYL) 20 MG tablet TK 1 T PO QID 09/11/17   Historical Provider, MD   DULoxetine (CYMBALTA) 60 MG extended release capsule Take 60 mg by mouth daily    Historical Provider, MD   gabapentin (NEURONTIN) 400 MG capsule Take 400 mg by mouth 3 times daily.    Historical Provider, MD   pantoprazole (PROTONIX) 40 MG tablet Take 40 mg by mouth 2 times daily    Historical Provider, MD   B Complex-C-Folic Acid (DIALYVITE TABLET) TABS Take 1 tablet by mouth daily    Historical Provider, MD   tamsulosin (FLOMAX) 0.4 MG capsule Take 0.4 mg by mouth daily    Historical Provider, MD   albuterol sulfate HFA 108 (90 Base) MCG/ACT inhaler Inhale 2 puffs into the lungs every 6 hours as needed for Wheezing    Historical Provider, MD   tiZANidine (ZANAFLEX) 4 MG tablet Take 4 mg by mouth 2 times daily     Historical Provider, MD   docusate sodium (COLACE) 100 MG capsule Take 1 capsule by mouth daily as needed     Historical Provider, MD   lisinopril (PRINIVIL;ZESTRIL) 20 MG tablet Take  20 mg by mouth daily    Historical Provider, MD   ondansetron (ZOFRAN ODT) 4 MG disintegrating tablet Take 1 tablet by mouth every 8 hours as needed for Nausea or Vomiting 06/02/17   Huston Foley, MD   aspirin 81 MG chewable tablet Take 1 tablet by mouth daily 06/03/17   Huston Foley, MD   metFORMIN (GLUCOPHAGE) 1000 MG tablet Take 1,000 mg by mouth 2 times daily (with meals)    Historical Provider, MD   hydrOXYzine (VISTARIL) 50 MG capsule Take 50 mg by mouth 3 times daily as needed for Itching    Historical Provider, MD   atorvastatin (LIPITOR) 20 MG tablet Take 20 mg by mouth daily    Historical Provider, MD       Social history:  reports that he has been smoking  cigarettes. He has been smoking about 1.50 packs per day. He has never used smokeless tobacco. He reports that he has current or past drug history. Drug: Marijuana. Frequency: 3.00 times per week. He reports that he does not drink alcohol.    Family history:    Family History   Problem Relation Age of Onset   ??? Stroke Sister    ??? Heart Attack Brother          Exam  BP (!) 125/57    Pulse 79    Temp 97.8 ??F (36.6 ??C) (Oral)    Resp 16    Ht 5\' 10"  (1.778 m)    Wt 190 lb (86.2 kg)    SpO2 98%    BMI 27.26 kg/m??   Nursing note and vitals reviewed.  Constitutional: Well developed, well nourished. No acute distress.    HENT:      Head: Normocephalic and atraumatic.      Ears: External ears normal.      Nose: Nose normal.     Mouth: Membrane mucosa moist and pink.  No posterior oropharynx erythema or tonsillar edema  Eyes: Anicteric sclera. No discharge, PERRL  Neck: Supple. Trachea midline.   Cardiovascular: RRR, no murmurs, rubs, or gallops, radial pulses 2+ bilaterally.  Unable to palpate dorsalis pedis and poor posterior tibialis pulses in the bilateral feet although they are warm to touch and have brisk capillary refill bilaterally.  Pulmonary/Chest: Effort normal. No respiratory distress. CTAB. No stridor. No wheezes. No rales.   Abdominal: Soft.  Diffuse abdominal tenderness worse in the bilateral lower abdomen noted. No distension.  No guarding, rebound tenderness, or evidence of ascites.    GU: No CVA tenderness.     Musculoskeletal: Moves all extremities. No gross deformity.    Neurological: Alert and oriented to person, place, and time. Normal muscle tone.      Skin: Warm and dry. No rash.  Patient has a proximally 5 cm open wound with packing noted in the right inner thigh, packing removed, wound base appears clear, there is very scant surrounding erythema.  He also has a mostly closed wound that is approximately 5 to 6 cm long in the right groin, the inferior edge is open approximately 8 mm and there is a  small amount of pustular discharge from this area, there is no surrounding erythema or induration, area is tender to palpation.  Psychiatric: Normal mood and affect. Behavior is normal.      EKG   Please see Dr. Elsie Lincoln note for EKG read.      Radiographs (if obtained):  []  The following radiograph was interpreted by myself in the  absence of a radiologist:   [x]  Radiologist's Report Reviewed:  CTA ABDOMINAL AORTA W BILAT RUNOFF W CONTRAST   Final Result   Right-side: High-grade stenosis throughout the right external iliac artery.   Patent right femoral to distal SFA bypass graft.  At the level of the distal   anastomosis of the bypass graft, there is a open wound medially with an   approximately 2.5 cm rim enhancing collection containing tiny focus of gas   which extends to the level of the wound.  Underlying abscess not excluded.   Patent right below knee runoff.      Left side: High-grade stenosis within the proximal left external iliac   artery.  Approximately 4.5 cm occlusion of the mid to distal left SFA with   reconstitution of the distal segment.  Left below knee runoff appears patent.         XR CHEST PORTABLE   Final Result   1. No active pulmonary disease.                Labs  Results for orders placed or performed during the hospital encounter of 11/09/17   Culture blood 1   Result Value Ref Range    Specimen BLOOD     Special Requests NONE     Culture NO AEROBIC GROWTH AT 24 HOURS    Culture, Wound   Result Value Ref Range    Specimen INCISION     Special Requests Right femur     Culture NO AEROBIC GROWTH AT 24 HOURS    Culture, Wound   Result Value Ref Range    Specimen INCISION     Special Requests Right groin     Culture NO AEROBIC GROWTH AT 24 HOURS    CBC auto diff   Result Value Ref Range    WBC 24.4 (H) 4.0 - 10.5 K/CU MM    RBC 4.43 (L) 4.6 - 6.2 M/CU MM    Hemoglobin 11.8 (L) 13.5 - 18.0 GM/DL    Hematocrit 38.4 (L) 42 - 52 %    MCV 86.7 78 - 100 FL    MCH 26.6 (L) 27 - 31 PG    MCHC 30.7 (L)  32.0 - 36.0 %    RDW 15.9 (H) 11.7 - 14.9 %    Platelets 528 (H) 140 - 440 K/CU MM    MPV 9.5 7.5 - 11.1 FL    Differential Type AUTOMATED DIFFERENTIAL     Segs Relative 57.4 36 - 66 %    Lymphocytes % 35.9 24 - 44 %    Monocytes % 3.3 0 - 4 %    Eosinophils % 2.1 0 - 3 %    Basophils % 0.6 0 - 1 %    Segs Absolute 14.0 K/CU MM    Lymphocytes Absolute 8.8 K/CU MM    Monocytes Absolute 0.8 K/CU MM    Eosinophils Absolute 0.5 K/CU MM    Basophils Absolute 0.1 K/CU MM    Nucleated RBC % 0.0 %    Total Nucleated RBC 0.0 K/CU MM    Total Immature Neutrophil 0.16 K/CU MM    Immature Neutrophil % 0.7 (H) 0 - 0.43 %   CMP   Result Value Ref Range    Sodium 139 135 - 145 MMOL/L    Potassium (HH) 3.5 - 5.1 MMOL/L     5.5  K CALLED TO MATT BRUMFIELD PA ON 11/09/2017 @ 1337 BY JCHETTY MLS   RESULTS  READ BACK      Chloride 104 99 - 110 mMol/L    CO2 22 21 - 32 MMOL/L    BUN 11 6 - 23 MG/DL    CREATININE 0.8 (L) 0.9 - 1.3 MG/DL    Glucose 192 (H) 70 - 99 MG/DL    Calcium 9.0 8.3 - 10.6 MG/DL    Alb 4.5 3.4 - 5.0 GM/DL    Total Protein 7.6 6.4 - 8.2 GM/DL    Total Bilirubin 0.2 0.0 - 1.0 MG/DL    ALT 10 10 - 40 U/L    AST 11 (L) 15 - 37 IU/L    Alkaline Phosphatase 77 40 - 129 IU/L    GFR Non-African American >60 >60 mL/min/1.12m2    GFR African American >60 >60 mL/min/1.45m2    Anion Gap 13 4 - 16   Lipase   Result Value Ref Range    Lipase 31 13 - 60 IU/L   Lactic Acid, Plasma   Result Value Ref Range    Lactate (HH) 0.4 - 2.0 mMOL/L     2.7  LACT CALLED TO MATT BRUMFIELD PA ON 11/09/2017 @ 1337 BY JCHETTY MLS   RESULTS READ BACK     Urinalysis (Lab)   Result Value Ref Range    Color, UA YELLOW YELLOW    Clarity, UA CLEAR CLEAR    Glucose, Urine NEGATIVE NEGATIVE MG/DL    Bilirubin Urine NEGATIVE NEGATIVE MG/DL    Ketones, Urine SMALL (A) NEGATIVE MG/DL    Specific Gravity, UA 1.024 1.001 - 1.035    Blood, Urine NEGATIVE NEGATIVE    pH, Urine 5.0 5.0 - 8.0    Protein, UA NEGATIVE NEGATIVE MG/DL    Urobilinogen, Urine NORMAL 0.2  - 1.0 MG/DL    Nitrite Urine, Quantitative NEGATIVE NEGATIVE    Leukocyte Esterase, Urine NEGATIVE NEGATIVE    RBC, UA NONE SEEN 0 - 3 /HPF    WBC, UA <1 0 - 2 /HPF    Bacteria, UA NEGATIVE NEGATIVE /HPF    Mucus, UA RARE (A) NEGATIVE HPF    Trichomonas, UA NONE SEEN NONE SEEN /HPF    Hyaline Casts, UA 0 /LPF   Lactic Acid, Plasma   Result Value Ref Range    Lactate 2.8 (HH) 0.4 - 2.0 mMOL/L   CRP   Result Value Ref Range    CRP, High Sensitivity 5.3 mg/L   Potassium   Result Value Ref Range    Potassium 4.4 3.5 - 5.1 MMOL/L   POCT Glucose   Result Value Ref Range    POC Glucose 136 (H) 70 - 99 MG/DL   POCT Glucose   Result Value Ref Range    POC Glucose 223 (H) 70 - 99 MG/DL         MDM  Patient presents for pain in right leg, had femoral-popliteal bypass performed a few weeks ago, does have some slight dehiscence of wound and right groin as well as surgical opening of the wound in the right distal inner thigh.  Distal wound does not appear grossly infected on exam patient does report drainage from the site at home and increased pain primarily at this site.  Laboratory work-up reveals very elevated white blood cell count, hyperkalemia, lactic acidosis.  Patient started on IV fluids, broad-spectrum antibiotics.  CT aorta with bilateral runoff reveals abscess with tiny foci of gas in the right leg distal wound.  Clinically this appears concerning for necrotizing fasciitis, started on clindamycin.  Dr. Olen Cordial consulted [facility] and they accepted patient in transfer.    This patient was also seen and evaluated by Dr. Olen Cordial.     Final Impression  1. Necrotizing fasciitis (HCC)        Blood pressure (!) 125/57, pulse 79, temperature 97.8 ??F (36.6 ??C), temperature source Oral, resp. rate 16, height 5\' 10"  (1.778 m), weight 190 lb (86.2 kg), SpO2 98 %.     Disposition:  Transfer to [facility] to ED in stable condition.        This chart was generated using the [employer] dictation system. I created this record  but it may contain dictation errors given the limitations of this technology.       Christian Mate Avamae Dehaan, PA-C  11/11/17 1055

## 2017-11-09 NOTE — ED Notes (Signed)
[facility] arrived to transport pt to [place]. [place] ED called to give report on pt. Pt in stable condition. Sent with his clothing and cane. Wound wrapped and dressing clean. Pt agreeable. Ambulates to stretcher and is taken out of ED. Necessary paperwork sent with transport company.      Matthew Folks, RN  11/09/17 2214

## 2017-11-09 NOTE — ED Provider Notes (Signed)
As provider-in-triage, I performed a medical screening history and physical exam on this patient.    HISTORY OF PRESENT ILLNESS  Ricardo Foley is a 57 y.o. male who presents with right upper leg pain and abdominal pain. Patient has had recent arterial stent place and a subsequent post op infection needing I&D on 10/10. Has had worsening pain and drainage from site. Now complaining of N/V and right lower abdominal pain. BS has not been well controlled. Appers sweaty and diaphoretic in triage,denies chest pain, sob. States he feels cold and is shaking.       PHYSICAL EXAM  BP (!) 141/75   Pulse 74   Temp 97.8 F (36.6 C) (Oral)   Resp 16   Ht 5\' 10"  (1.778 m)   Wt 190 lb (86.2 kg)   SpO2 97%   BMI 27.26 kg/m     On exam, the patient appears in no acute distress. Speech is clear. Breathing is unlabored.  Moves all extremities    Comment: Please note this report has been produced using speech recognition software and may contain errors related to that system including errors in grammar, punctuation, and spelling, as well as words and phrases that may be inappropriate. If there are any questions or concerns please feel free to contact the dictating provider for clarification.         Anastasio Auerbach Thandiwe Siragusa, PA  11/09/17 1258

## 2017-11-10 LAB — C-REACTIVE PROTEIN: CRP, High Sensitivity: 5.3 mg/L

## 2017-11-10 LAB — POCT GLUCOSE
POC Glucose: 136 MG/DL — ABNORMAL HIGH (ref 70–99)
POC Glucose: 223 MG/DL — ABNORMAL HIGH (ref 70–99)

## 2017-11-10 LAB — POTASSIUM: Potassium: 4.4 MMOL/L (ref 3.5–5.1)

## 2017-11-10 MED ORDER — MORPHINE SULFATE (PF) 4 MG/ML IJ SOLN
4 | INTRAMUSCULAR | Status: DC | PRN
Start: 2017-11-10 — End: 2017-11-10
  Administered 2017-11-10: 01:00:00 4 mg via INTRAVENOUS

## 2017-11-10 MED ORDER — INSULIN REGULAR HUMAN 100 UNIT/ML IJ SOLN
100 UNIT/ML | Freq: Once | INTRAMUSCULAR | Status: AC
Start: 2017-11-10 — End: 2017-11-09
  Administered 2017-11-10: 02:00:00 10 [IU] via INTRAVENOUS

## 2017-11-10 MED ORDER — DEXTROSE 50 % IV SOLN
50 % | Freq: Once | INTRAVENOUS | Status: AC
Start: 2017-11-10 — End: 2017-11-09
  Administered 2017-11-10: 02:00:00 25 g via INTRAVENOUS

## 2017-11-10 MED ORDER — HYDROMORPHONE HCL 2 MG/ML IJ SOLN
2 MG/ML | Freq: Once | INTRAMUSCULAR | Status: AC
Start: 2017-11-10 — End: 2017-11-09
  Administered 2017-11-10: 02:00:00 1 mg via INTRAVENOUS

## 2017-11-10 MED FILL — MORPHINE SULFATE 4 MG/ML IJ SOLN: 4 mg/mL | INTRAMUSCULAR | Qty: 1

## 2017-11-10 MED FILL — HUMULIN R 100 UNIT/ML IJ SOLN: 100 [IU]/mL | INTRAMUSCULAR | Qty: 0.1

## 2017-11-10 MED FILL — DEXTROSE 50 % IV SOLN: 50 % | INTRAVENOUS | Qty: 50

## 2017-11-10 MED FILL — HYDROMORPHONE HCL 2 MG/ML IJ SOLN: 2 mg/mL | INTRAMUSCULAR | Qty: 1

## 2017-11-13 LAB — CULTURE, WOUND

## 2017-11-14 LAB — CULTURE, WOUND: Culture: NO GROWTH

## 2017-11-14 LAB — CULTURE BLOOD #1
Culture: NO GROWTH
Culture: NO GROWTH

## 2017-11-22 ENCOUNTER — Inpatient Hospital Stay: Payer: PRIVATE HEALTH INSURANCE | Primary: Family

## 2017-11-22 DIAGNOSIS — C9190 Lymphoid leukemia, unspecified not having achieved remission: Secondary | ICD-10-CM

## 2017-11-22 LAB — COMPREHENSIVE METABOLIC PANEL
ALT: 9 U/L — ABNORMAL LOW (ref 10–40)
AST: 12 IU/L — ABNORMAL LOW (ref 15–37)
Albumin: 4.8 GM/DL (ref 3.4–5.0)
Alkaline Phosphatase: 88 IU/L (ref 40–129)
Anion Gap: 12 (ref 4–16)
BUN: 9 MG/DL (ref 6–23)
CO2: 28 MMOL/L (ref 21–32)
Calcium: 10.5 MG/DL (ref 8.3–10.6)
Chloride: 101 mMol/L (ref 99–110)
Creatinine: 0.8 MG/DL — ABNORMAL LOW (ref 0.9–1.3)
GFR African American: >60 mL/min/{1.73_m2} (ref >60)
GFR Non-African American: >60 mL/min/{1.73_m2} (ref >60)
Glucose: 123 MG/DL — ABNORMAL HIGH (ref 70–99)
Potassium: 4.7 MMOL/L (ref 3.5–5.1)
Sodium: 141 MMOL/L (ref 135–145)
Total Bilirubin: 0.2 MG/DL (ref 0.0–1.0)
Total Protein: 7.2 GM/DL (ref 6.4–8.2)

## 2017-11-22 LAB — LACTATE DEHYDROGENASE: LD: 125 IU/L (ref 120–246)

## 2018-01-31 ENCOUNTER — Inpatient Hospital Stay: Admit: 2018-01-31 | Discharge: 2018-01-31 | Payer: PRIVATE HEALTH INSURANCE | Primary: Family

## 2018-01-31 ENCOUNTER — Encounter
Admit: 2018-01-31 | Discharge: 2018-01-31 | Payer: PRIVATE HEALTH INSURANCE | Attending: Internal Medicine | Primary: Family

## 2018-01-31 DIAGNOSIS — C9190 Lymphoid leukemia, unspecified not having achieved remission: Secondary | ICD-10-CM

## 2018-01-31 LAB — CBC WITH AUTO DIFFERENTIAL
Eosinophils %: 4 % — ABNORMAL HIGH (ref 0–3)
Eosinophils Absolute: 0.7 10*3/uL
Hematocrit: 33.9 % — ABNORMAL LOW (ref 42–52)
Hemoglobin: 11.3 GM/DL — ABNORMAL LOW (ref 13.5–18.0)
Lymphocytes %: 54 % — ABNORMAL HIGH (ref 24–44)
Lymphocytes Absolute: 9.2 10*3/uL
MCH: 26.5 PG — ABNORMAL LOW (ref 27–31)
MCHC: 33.3 % (ref 32.0–36.0)
MCV: 79.4 FL (ref 78–100)
MPV: 9 FL (ref 7.5–11.1)
Monocytes %: 5 % — ABNORMAL HIGH (ref 0–4)
Monocytes Absolute: 0.9 10*3/uL
Neutrophils Absolute: 6.3 10*3/uL
Platelets: 358 10*3/uL (ref 140–440)
RBC: 4.27 10*6/uL — ABNORMAL LOW (ref 4.6–6.2)
RDW: 17.9 % — ABNORMAL HIGH (ref 11.7–14.9)
Segs Relative: 37 % (ref 36–66)
WBC: 17.1 10*3/uL — ABNORMAL HIGH (ref 4.0–10.5)

## 2018-01-31 LAB — COMPREHENSIVE METABOLIC PANEL
ALT: 9 U/L — ABNORMAL LOW (ref 10–40)
AST: 9 IU/L — ABNORMAL LOW (ref 15–37)
Albumin: 4.5 GM/DL (ref 3.4–5.0)
Alkaline Phosphatase: 84 IU/L (ref 40–129)
Anion Gap: 12 (ref 4–16)
BUN: 14 MG/DL (ref 6–23)
CO2: 25 MMOL/L (ref 21–32)
Calcium: 9 MG/DL (ref 8.3–10.6)
Chloride: 98 mMol/L — ABNORMAL LOW (ref 99–110)
Creatinine: 1 MG/DL (ref 0.9–1.3)
GFR African American: >60 mL/min/{1.73_m2} (ref >60)
GFR Non-African American: >60 mL/min/{1.73_m2} (ref >60)
Glucose: 226 MG/DL — ABNORMAL HIGH (ref 70–99)
Potassium: 5.1 MMOL/L (ref 3.5–5.1)
Sodium: 135 MMOL/L (ref 135–145)
Total Bilirubin: 0.2 MG/DL (ref 0.0–1.0)
Total Protein: 6.7 GM/DL (ref 6.4–8.2)

## 2018-01-31 LAB — LACTATE DEHYDROGENASE: LD: 121 IU/L (ref 120–246)

## 2018-03-04 NOTE — Telephone Encounter (Signed)
Patient asking for a refill for Coreg also

## 2018-03-05 MED ORDER — ASPIRIN 81 MG PO CHEW
81 MG | ORAL_TABLET | Freq: Every day | ORAL | 3 refills | Status: AC
Start: 2018-03-05 — End: ?

## 2018-03-05 NOTE — Telephone Encounter (Signed)
Left message, will refill ASA. Coreg is not on med list. Will need to contact ordering doctor

## 2018-03-10 ENCOUNTER — Emergency Department: Admit: 2018-03-11 | Payer: PRIVATE HEALTH INSURANCE | Primary: Family

## 2018-03-10 DIAGNOSIS — R1013 Epigastric pain: Secondary | ICD-10-CM

## 2018-03-10 NOTE — ED Provider Notes (Signed)
eMERGENCY dEPARTMENT eNCOUnter      CHIEF COMPLAINT    Chief Complaint   Patient presents with   . Abdominal Pain       HPI    Ricardo Foley is a 58 y.o. male who presents with abdominal pain.  Onset:  About 10 days ago, although he reports he has had this pain his entire life.  Pain is varied in location, at sites of what he has been told are fatty tumors.  Denies any associated nausea, vomiting, diarrhea, constipation.  Had a normal bowel movement today.  Tolerating PO intake without difficulty.  Denies any fever or chills.  Denies chest pain or sob.  States he has tried managing the pain at home but couldn't take it any more.  He mentions several times that he usually comes in for Dilaudid and is sent home on Percocet when he has this pain.      REVIEW OF SYSTEMS    See HPI for further details. Review of systems otherwise negative.   Constitutional:  Denies fever.  HENT:  Denies headache.   Respiratory:  Denies any shortness of breath.  Cardiovascular:  Denies chest pain.  GI:  + abdominal pain, denies nausea, denies vomiting, denies diarrhea, denies constipation.  GU:  Denies urinary symptoms.  Musculoskeletal:  Denies any extremity pain.  Back:  Denies back pain.  Integument:  Denies any skin changes.  Lymphatic:  Denies lymphadenopathy.    PAST MEDICAL HISTORY    Past Medical History:   Diagnosis Date   . CAD (coronary artery disease)    . Chronic back pain     Had shots August 2019   . Diabetes (HCC)     Diagnosed about 2009   . H/O cardiac catheterization 08/02/2017     severe single vessel CAD of the distal LCX which is small caliber and not amenable to PCI.   Marland Kitchen H/O cardiovascular stress test 07/03/2017    scan shows moderate size, severe intensity, non reversible perfusion defect in inferolateral wall, normal LVEF.   Marland Kitchen H/O Doppler lower arterial ultrasound 07/11/2017    The Right Proximal and mid SFA exhibits an occlusion. The Left SFA exhibits an occlusion between the mid and distal portions. Right  ABIs show Severe peripheral arterial disease.   . H/O echocardiogram 05/30/2017    Left ventricular systolic function is normal. Ejection fraction is visually estimated at >55%.   Marland Kitchen Hyperlipidemia    . Hypertension    . Nerve pain    . PVD (peripheral vascular disease) (HCC) 08/02/2017     Severe lower extremity PAD. See abn Peripherial Angio. ( ABN Arterial US.07/11/17)   . Scoliosis        SURGICAL HISTORY    Past Surgical History:   Procedure Laterality Date   . PR SONO GUIDE NEEDLE BIOPSY         CURRENT MEDICATIONS    Current Outpatient Rx   Medication Sig Dispense Refill   . aspirin 81 MG chewable tablet Take 1 tablet by mouth daily 90 tablet 3   . dicyclomine (BENTYL) 20 MG tablet TK 1 T PO QID  3   . DULoxetine (CYMBALTA) 60 MG extended release capsule Take 60 mg by mouth daily     . gabapentin (NEURONTIN) 400 MG capsule Take 400 mg by mouth 3 times daily.     . pantoprazole (PROTONIX) 40 MG tablet Take 40 mg by mouth 2 times daily     . B Complex-C-Folic  Acid (DIALYVITE TABLET) TABS Take 1 tablet by mouth daily     . tamsulosin (FLOMAX) 0.4 MG capsule Take 0.4 mg by mouth daily     . albuterol sulfate HFA 108 (90 Base) MCG/ACT inhaler Inhale 2 puffs into the lungs every 6 hours as needed for Wheezing     . tiZANidine (ZANAFLEX) 4 MG tablet Take 4 mg by mouth 2 times daily      . docusate sodium (COLACE) 100 MG capsule Take 1 capsule by mouth daily as needed      . lisinopril (PRINIVIL;ZESTRIL) 20 MG tablet Take 20 mg by mouth daily     . ondansetron (ZOFRAN ODT) 4 MG disintegrating tablet Take 1 tablet by mouth every 8 hours as needed for Nausea or Vomiting 20 tablet 0   . metFORMIN (GLUCOPHAGE) 1000 MG tablet Take 1,000 mg by mouth 2 times daily (with meals)     . hydrOXYzine (VISTARIL) 50 MG capsule Take 50 mg by mouth 3 times daily as needed for Itching     . atorvastatin (LIPITOR) 20 MG tablet Take 20 mg by mouth daily         ALLERGIES    No Known Allergies    FAMILY HISTORY    Family History   Problem  Relation Age of Onset   . Stroke Sister    . Heart Attack Brother        SOCIAL HISTORY    Social History     Socioeconomic History   . Marital status: Single     Spouse name: None   . Number of children: None   . Years of education: None   . Highest education level: None   Occupational History   . None   Social Needs   . Financial resource strain: None   . Food insecurity:     Worry: None     Inability: None   . Transportation needs:     Medical: None     Non-medical: None   Tobacco Use   . Smoking status: Current Every Day Smoker     Packs/day: 1.50     Types: Cigarettes   . Smokeless tobacco: Never Used   Substance and Sexual Activity   . Alcohol use: No   . Drug use: Yes     Frequency: 3.0 times per week     Types: Marijuana   . Sexual activity: None   Lifestyle   . Physical activity:     Days per week: None     Minutes per session: None   . Stress: None   Relationships   . Social connections:     Talks on phone: None     Gets together: None     Attends religious service: None     Active member of club or organization: None     Attends meetings of clubs or organizations: None     Relationship status: None   . Intimate partner violence:     Fear of current or ex partner: None     Emotionally abused: None     Physically abused: None     Forced sexual activity: None   Other Topics Concern   . None   Social History Narrative   . None       PHYSICAL EXAM    VITAL SIGNS: BP 126/64   Pulse 101   Temp 98.7 F (37.1 C) (Oral)   Resp 18   Ht 5\' 6"  (1.676 m)  Wt 166 lb (75.3 kg)   SpO2 95%   BMI 26.79 kg/m   Constitutional:  Well developed, well nourished, no acute distress, non-toxic appearance   Eyes:  Sclera anicteric.    HENT:  NC/AT.  Ears, nose normal.  Oropharynx moist.  Neck:  Supple.  Respiratory:  Lungs CTAB.    Cardiovascular:  RRR.  GI:  Abdomen is soft, non-distended.  There is tenderness over the left mid abdomen with palpable likely lipoma.  No overlying erythema or warmth.  There is also  tenderness to the lower central abdomen where patient states he feels another lump, although I do not appreciate one.  BS active.  GU:  No CVA tenderness.  Musculoskeletal:  No acute deformities.   Integument:  Warm and dry.  Neurologic:  Alert & oriented.  No focal deficits.    LABS/IMAGING    Labs Reviewed   CBC WITH AUTO DIFFERENTIAL - Abnormal; Notable for the following components:       Result Value    WBC 21.5 (*)     RBC 4.46 (*)     Hemoglobin 12.0 (*)     Hematocrit 37.7 (*)     MCH 26.9 (*)     MCHC 31.8 (*)     RDW 16.7 (*)     Lymphocytes % 49.9 (*)     Monocytes % 6.5 (*)     Eosinophils % 4.7 (*)     All other components within normal limits   URINALYSIS - Abnormal; Notable for the following components:    Ketones, Urine SMALL (*)     Mucus, UA RARE (*)     All other components within normal limits   COMPREHENSIVE METABOLIC PANEL   LIPASE   LACTIC ACID, PLASMA       Ct Abdomen Pelvis W Iv Contrast Additional Contrast? None    Result Date: 03/10/2018  EXAMINATION: CT OF THE ABDOMEN AND PELVIS WITH CONTRAST 03/10/2018 10:26 pm TECHNIQUE: CT of the abdomen and pelvis was performed with the administration of intravenous contrast. Multiplanar reformatted images are provided for review. Dose modulation, iterative reconstruction, and/or weight based adjustment of the mA/kV was utilized to reduce the radiation dose to as low as reasonably achievable. COMPARISON: CT 11/09/2016 HISTORY: ORDERING SYSTEM PROVIDED HISTORY: abd pain x 10 days TECHNOLOGIST PROVIDED HISTORY: Reason for exam:->abd pain x 10 days Additional Contrast?->None Reason for Exam: abd pain FINDINGS: Lower Chest: Lung bases are clear Organs: Liver, spleen, adrenal glands, kidneys, gallbladder unremarkable. GI/Bowel: Mild-to-moderate retained stool colon.  No evidence of bowel obstruction.  Mild transverse in descending and sigmoid colonic diverticulosis.  Appendix unremarkable. Pelvis: Bladder is unremarkable.  Prostate unremarkable.  Peritoneum/Retroperitoneum: Aortic plaque and atherosclerotic disease identified.  Aorta is nonaneurysmal. Bones/Soft Tissues: Postsurgical changes identified within the right groin. No suspicious osseous lesions.     No acute inflammatory process or bowel obstruction. Colonic diverticulosis.       ED COURSE & MEDICAL DECISION MAKING    Pertinent Labs & Imaging studies reviewed. (See chart for details)   -  Patient seen and evaluated in the emergency department.  -  Triage and nursing notes reviewed and incorporated.  -  Old chart records reviewed and incorporated.  -  Supervising physician was Dr. Perrin Maltese.  Patient was seen independently.  -  Differential diagnosis includes:  appendicitis, gastritis, bowel obstruction, cholecystitis/cholelithiasis, diverticulitis, incarcerated hernia, pancreatitis, performed bowel, uti, and others   -  Work-up included:  see above  -  ED treatment  included:  NS, morphine, Zofran, Bentyl, Toradol, Percocet  -  Results discussed with patient.  White count is 21,500--chronically elevated, which patient is aware of.  CMP is unremarkable.  Lipase is 72.  Lactic is normal.  UA unremarkable.  CT shows no acute process.  Patient does have what feels to be a lipoma to the left abdomen.  His exam is otherwise benign.  States he has had this pain his entire life and "no one can figure out what is wrong."  He does follow with GI, will provide General Surgery for outpatient FU as well.  Will dc home with Bentyl and Toradol.  Return as needed.  He is agreeable with plan of care and disposition.  -  Disposition:  Home    FINAL IMPRESSION    1. Abdominal pain, unspecified abdominal location    2. Leukocytosis, unspecified type    3. Abdominal lipoma                Weston Brass, PA-C  03/11/18 [phone]

## 2018-03-10 NOTE — ED Triage Notes (Signed)
C/o abd pain x 1 1/2 weeks

## 2018-03-10 NOTE — ED Provider Notes (Signed)
As provider-in-triage, I performed a medical screening history and physical exam on this patient.    HISTORY OF PRESENT ILLNESS  Ricardo Foley is a 58 y.o. male who presents for epigastric pain for the past 1.5 weeks.      PHYSICAL EXAM  BP 126/64   Pulse 101   Temp 98.7 F (37.1 C) (Oral)   Resp 18   Ht 5\' 6"  (1.676 m)   Wt 166 lb (75.3 kg)   SpO2 95%   BMI 26.79 kg/m     On exam, the patient appears well-hydrated, well-nourished, and in no acute distress. Mucous membranes are moist. Speech is clear. Breathing is unlabored.  Skin is dry. Mental status is normal. Moves all extremities, and is without facial droop.     Comment: Please note this report has been produced using speech recognition software and may contain errors related to that system including errors in grammar, punctuation, and spelling, as well as words and phrases that may be inappropriate. If there are any questions or concerns please feel free to contact the dictating provider for clarification.       Nolon Bussing Friedrich Harriott, PA-C  03/10/18 2034

## 2018-03-11 ENCOUNTER — Inpatient Hospital Stay
Admit: 2018-03-11 | Discharge: 2018-03-11 | Disposition: A | Discharge status: Home / Self Care | Payer: PRIVATE HEALTH INSURANCE

## 2018-03-11 LAB — CBC WITH AUTO DIFFERENTIAL
Basophils %: 0.6 % (ref 0–1)
Basophils Absolute: 0.1 10*3/uL
Eosinophils %: 4.7 % — ABNORMAL HIGH (ref 0–3)
Eosinophils Absolute: 1 10*3/uL
Hematocrit: 37.7 % — ABNORMAL LOW (ref 42–52)
Hemoglobin: 12 GM/DL — ABNORMAL LOW (ref 13.5–18.0)
Immature Neutrophil %: 0.4 % (ref 0–0.43)
Lymphocytes %: 49.9 % — ABNORMAL HIGH (ref 24–44)
Lymphocytes Absolute: 10.7 10*3/uL
MCH: 26.9 PG — ABNORMAL LOW (ref 27–31)
MCHC: 31.8 % — ABNORMAL LOW (ref 32.0–36.0)
MCV: 84.5 FL (ref 78–100)
MPV: 9.3 FL (ref 7.5–11.1)
Monocytes %: 6.5 % — ABNORMAL HIGH (ref 0–4)
Monocytes Absolute: 1.4 10*3/uL
Neutrophils Absolute: 8.2 10*3/uL
Nucleated RBC %: 0 %
Platelets: 419 10*3/uL (ref 140–440)
RBC: 4.46 10*6/uL — ABNORMAL LOW (ref 4.6–6.2)
RDW: 16.7 % — ABNORMAL HIGH (ref 11.7–14.9)
Segs Relative: 37.9 % (ref 36–66)
Total Immature Neutrophil: 0.09 10*3/uL
Total Nucleated RBC: 0 10*3/uL
WBC: 21.5 10*3/uL — ABNORMAL HIGH (ref 4.0–10.5)

## 2018-03-11 LAB — COMPREHENSIVE METABOLIC PANEL
ALT: 11 U/L (ref 10–40)
AST: 10 IU/L — ABNORMAL LOW (ref 15–37)
Albumin: 4.7 GM/DL (ref 3.4–5.0)
Alkaline Phosphatase: 75 IU/L (ref 40–129)
Anion Gap: 15 (ref 4–16)
BUN: 14 MG/DL (ref 6–23)
CO2: 25 MMOL/L (ref 21–32)
Calcium: 9.5 MG/DL (ref 8.3–10.6)
Chloride: 98 mMol/L — ABNORMAL LOW (ref 99–110)
Creatinine: 1.3 MG/DL (ref 0.9–1.3)
GFR African American: >60 mL/min/{1.73_m2} (ref >60)
GFR Non-African American: 57 mL/min/{1.73_m2} — ABNORMAL LOW (ref >60)
Glucose: 184 MG/DL — ABNORMAL HIGH (ref 70–99)
Potassium: 4.1 MMOL/L (ref 3.5–5.1)
Sodium: 138 MMOL/L (ref 135–145)
Total Bilirubin: 0.1 MG/DL (ref 0.0–1.0)
Total Protein: 7.3 GM/DL (ref 6.4–8.2)

## 2018-03-11 LAB — URINALYSIS
Bacteria, UA: NEGATIVE /HPF
Bilirubin Urine: NEGATIVE MG/DL
Blood, Urine: NEGATIVE
Glucose, Urine: NEGATIVE MG/DL
Hyaline Casts, UA: 2 /LPF
Leukocyte Esterase, Urine: NEGATIVE
Nitrite Urine, Quantitative: NEGATIVE
Protein, UA: NEGATIVE MG/DL
RBC, UA: NONE SEEN /HPF (ref 0–3)
Specific Gravity, UA: 1.024 (ref 1.001–1.035)
Squam Epithel, UA: <1 /HPF
Trichomonas: NONE SEEN /[HPF]
Urobilinogen, Urine: NORMAL MG/DL (ref 0.2–1.0)
WBC, UA: NONE SEEN /HPF (ref 0–2)
pH, Urine: 5 (ref 5.0–8.0)

## 2018-03-11 LAB — LACTIC ACID: Lactate: 1.8 mMOL/L (ref 0.4–2.0)

## 2018-03-11 LAB — LIPASE: Lipase: 72 IU/L — ABNORMAL HIGH (ref 13–60)

## 2018-03-11 MED ORDER — OXYCODONE-ACETAMINOPHEN 5-325 MG PO TABS
5-325 MG | Freq: Once | ORAL | Status: AC
Start: 2018-03-11 — End: 2018-03-11
  Administered 2018-03-11: 05:00:00 1 via ORAL

## 2018-03-11 MED ORDER — SODIUM CHLORIDE 0.9 % IV BOLUS
0.9 % | Freq: Once | INTRAVENOUS | Status: AC
Start: 2018-03-11 — End: 2018-03-10
  Administered 2018-03-11: 03:00:00 1000 mL via INTRAVENOUS

## 2018-03-11 MED ORDER — ONDANSETRON HCL 4 MG/2ML IJ SOLN
4 MG/2ML | Freq: Once | INTRAMUSCULAR | Status: AC
Start: 2018-03-11 — End: 2018-03-10
  Administered 2018-03-11: 03:00:00 4 mg via INTRAVENOUS

## 2018-03-11 MED ORDER — IOPAMIDOL 76 % IV SOLN
76 % | Freq: Once | INTRAVENOUS | Status: AC | PRN
Start: 2018-03-11 — End: 2018-03-10
  Administered 2018-03-11: 04:00:00 75 mL via INTRAVENOUS

## 2018-03-11 MED ORDER — DICYCLOMINE HCL 10 MG PO CAPS
10 MG | ORAL_CAPSULE | Freq: Four times a day (QID) | ORAL | 0 refills | Status: DC
Start: 2018-03-11 — End: 2018-03-18

## 2018-03-11 MED ORDER — MORPHINE SULFATE (PF) 4 MG/ML IJ SOLN
4 MG/ML | INTRAMUSCULAR | Status: DC | PRN
Start: 2018-03-11 — End: 2018-03-11
  Administered 2018-03-11 (×3): 4 mg via INTRAVENOUS

## 2018-03-11 MED ORDER — KETOROLAC TROMETHAMINE 30 MG/ML IJ SOLN
30 MG/ML | Freq: Once | INTRAMUSCULAR | Status: AC
Start: 2018-03-11 — End: 2018-03-11
  Administered 2018-03-11: 05:00:00 30 mg via INTRAVENOUS

## 2018-03-11 MED ORDER — KETOROLAC TROMETHAMINE 10 MG PO TABS
10 MG | ORAL_TABLET | Freq: Four times a day (QID) | ORAL | 0 refills | Status: DC | PRN
Start: 2018-03-11 — End: 2018-11-07

## 2018-03-11 MED ORDER — DICYCLOMINE HCL 10 MG/ML IM SOLN
10 MG/ML | Freq: Once | INTRAMUSCULAR | Status: AC
Start: 2018-03-11 — End: 2018-03-11
  Administered 2018-03-11: 05:00:00 20 mg via INTRAMUSCULAR

## 2018-03-11 MED FILL — MORPHINE SULFATE 4 MG/ML IJ SOLN: 4 mg/mL | INTRAMUSCULAR | Qty: 1

## 2018-03-11 MED FILL — ISOVUE-370 76 % IV SOLN: 76 % | INTRAVENOUS | Qty: 100

## 2018-03-11 MED FILL — KETOROLAC TROMETHAMINE 30 MG/ML IJ SOLN: 30 mg/mL | INTRAMUSCULAR | Qty: 1

## 2018-03-11 MED FILL — OXYCODONE-ACETAMINOPHEN 5-325 MG PO TABS: 5-325 mg | ORAL | Qty: 1

## 2018-03-11 MED FILL — ONDANSETRON HCL 4 MG/2ML IJ SOLN: 4 MG/2ML | INTRAMUSCULAR | Qty: 2

## 2018-03-11 MED FILL — SODIUM CHLORIDE 0.9 % IV SOLN: 0.9 % | INTRAVENOUS | Qty: 1000

## 2018-03-11 MED FILL — BENTYL 10 MG/ML IM SOLN: 10 mg/mL | INTRAMUSCULAR | Qty: 2

## 2018-03-18 ENCOUNTER — Inpatient Hospital Stay
Admit: 2018-03-18 | Discharge: 2018-03-19 | Disposition: A | Discharge status: Home / Self Care | Payer: PRIVATE HEALTH INSURANCE | Attending: Emergency Medicine

## 2018-03-18 ENCOUNTER — Emergency Department: Admit: 2018-03-19 | Payer: PRIVATE HEALTH INSURANCE | Primary: Family

## 2018-03-18 DIAGNOSIS — K529 Noninfective gastroenteritis and colitis, unspecified: Secondary | ICD-10-CM

## 2018-03-18 LAB — COMPREHENSIVE METABOLIC PANEL
ALT: 11 U/L (ref 10–40)
AST: 11 IU/L — ABNORMAL LOW (ref 15–37)
Albumin: 4.6 GM/DL (ref 3.4–5.0)
Alkaline Phosphatase: 68 IU/L (ref 40–129)
Anion Gap: 16 (ref 4–16)
BUN: 16 MG/DL (ref 6–23)
CO2: 24 MMOL/L (ref 21–32)
Calcium: 9.2 MG/DL (ref 8.3–10.6)
Chloride: 98 mMol/L — ABNORMAL LOW (ref 99–110)
Creatinine: 1 MG/DL (ref 0.9–1.3)
GFR African American: >60 mL/min/{1.73_m2} (ref >60)
GFR Non-African American: >60 mL/min/{1.73_m2} (ref >60)
Glucose: 209 MG/DL — ABNORMAL HIGH (ref 70–99)
Potassium: 4 MMOL/L (ref 3.5–5.1)
Sodium: 138 MMOL/L (ref 135–145)
Total Bilirubin: 0.2 MG/DL (ref 0.0–1.0)
Total Protein: 7.5 GM/DL (ref 6.4–8.2)

## 2018-03-18 LAB — CBC WITH AUTO DIFFERENTIAL
Basophils %: 0.4 % (ref 0–1)
Basophils Absolute: 0.1 10*3/uL
Eosinophils %: 2.2 % (ref 0–3)
Eosinophils Absolute: 0.4 10*3/uL
Hematocrit: 39.4 % — ABNORMAL LOW (ref 42–52)
Hemoglobin: 12.6 GM/DL — ABNORMAL LOW (ref 13.5–18.0)
Immature Neutrophil %: 0.3 % (ref 0–0.43)
Lymphocytes %: 53.5 % — ABNORMAL HIGH (ref 24–44)
Lymphocytes Absolute: 10.7 10*3/uL
MCH: 27.4 PG (ref 27–31)
MCHC: 32 % (ref 32.0–36.0)
MCV: 85.7 FL (ref 78–100)
MPV: 9.5 FL (ref 7.5–11.1)
Monocytes %: 5.2 % — ABNORMAL HIGH (ref 0–4)
Monocytes Absolute: 1 10*3/uL
Nucleated RBC %: 0 %
Platelets: 478 10*3/uL — ABNORMAL HIGH (ref 140–440)
RBC: 4.6 10*6/uL (ref 4.6–6.2)
RDW: 16.7 % — ABNORMAL HIGH (ref 11.7–14.9)
Segs Absolute: 7.7 10*3/uL
Segs Relative: 38.4 % (ref 36–66)
Total Immature Neutrophil: 0.06 10*3/uL
Total Nucleated RBC: 0 10*3/uL
WBC: 20 10*3/uL — ABNORMAL HIGH (ref 4.0–10.5)

## 2018-03-18 LAB — CK: Total CK: 28 IU/L — ABNORMAL LOW (ref 38–174)

## 2018-03-18 LAB — LIPASE: Lipase: 71 IU/L — ABNORMAL HIGH (ref 13–60)

## 2018-03-18 MED ORDER — SODIUM CHLORIDE 0.9 % IV BOLUS
0.9 % | Freq: Once | INTRAVENOUS | Status: AC
Start: 2018-03-18 — End: 2018-03-18
  Administered 2018-03-19: 01:00:00 1000 mL via INTRAVENOUS

## 2018-03-18 MED ORDER — ONDANSETRON HCL 4 MG/2ML IJ SOLN
4 MG/2ML | Freq: Once | INTRAMUSCULAR | Status: AC
Start: 2018-03-18 — End: 2018-03-18
  Administered 2018-03-19: 01:00:00 4 mg via INTRAVENOUS

## 2018-03-18 MED ORDER — KETOROLAC TROMETHAMINE 30 MG/ML IJ SOLN
30 MG/ML | Freq: Once | INTRAMUSCULAR | Status: AC
Start: 2018-03-18 — End: 2018-03-18
  Administered 2018-03-19: 01:00:00 30 mg via INTRAVENOUS

## 2018-03-18 NOTE — ED Notes (Signed)
Delay in medication administration due to this rn just now getting assigned to zone 3 and taking over care for patients     Carroll Sage, RN  03/18/18 2001

## 2018-03-18 NOTE — ED Notes (Signed)
Per dr Antonietta Jewel can give one more dose of 4mg  morphine      Carroll Sage, RN  03/18/18 2158

## 2018-03-18 NOTE — ED Triage Notes (Signed)
Patient to ER for c/o generalized lower abd pain with N/V ongoing a week. Seen a week ago for same.

## 2018-03-18 NOTE — ED Provider Notes (Signed)
As provider-in-triage, I performed a medical screening history and physical exam on this patient.    HISTORY OF PRESENT ILLNESS  Ricardo Foley is a 58 y.o. male with past medical history of IBS who presents with 2 weeks of abdominal pain, nausea and vomiting.  Pain is mostly began across lower abdomen, however states it is generalized and also now having pain in left flank.  No associated fevers, urinary symptoms or diarrhea.  He was seen in the emergency department 8 days ago for similar.     PHYSICAL EXAM  BP 134/87    Pulse 93    Temp 98.1 ??F (36.7 ??C) (Oral)    Resp 18    Ht 5\' 6"  (1.676 m)    Wt 171 lb (77.6 kg)    SpO2 98%    BMI 27.60 kg/m??     On exam, the patient appears well-hydrated, well-nourished, and in no acute distress. Mucous membranes are moist. Speech is clear. Breathing is unlabored.  Skin is dry. Mental status is normal. Moves all extremities, and is without facial droop.     Diagnostics and treatment initiated. Patient moved to main ED.    Comment: Please note this report has been produced using speech recognition software and may contain errors related to that system including errors in grammar, punctuation, and spelling, as well as words and phrases that may be inappropriate. If there are any questions or concerns please feel free to contact the dictating provider for clarification.        Remonia Richter, PA  03/18/18 1714

## 2018-03-18 NOTE — ED Notes (Signed)
1951-wants pain meds first. 1830-still in [place]. [id number]

## 2018-03-18 NOTE — ED Provider Notes (Signed)
[facility] [place] ENCOUNTER      Pt Name: Ricardo Foley  MRN: [id number]  [place] 1960/06/17  Date of evaluation: 03/18/2018  Provider: Darvin Neighbours, DO    CHIEF COMPLAINT       Chief Complaint   Patient presents with   ??? Abdominal Pain     lower abd pain with N/V. seen a week ago for same         Ricardo Foley is a 58 y.o. male who presents to the emergency department  for   Chief Complaint   Patient presents with   ??? Abdominal Pain     lower abd pain with N/V. seen a week ago for same       The history is provided by the patient. No language interpreter was used.   Abdominal Pain   Pain location:  Generalized  Pain quality: aching, bloating and burning    Pain radiates to:  Does not radiate  Pain severity:  Severe  Onset quality:  Gradual  Duration:  1 week  Timing:  Constant  Progression:  Worsening  Chronicity:  Chronic  Context: not alcohol use, not awakening from sleep, not diet changes, not eating, not laxative use, not medication withdrawal, not previous surgeries, not recent illness, not recent sexual activity, not recent travel, not retching, not sick contacts, not suspicious food intake and not trauma    Relieved by:  Nothing  Worsened by:  Nothing  Ineffective treatments:  None tried  Associated symptoms: anorexia, belching, nausea and vomiting    Associated symptoms: no chest pain, no chills, no constipation, no cough, no diarrhea, no dysuria, no fatigue, no fever, no flatus, no hematemesis, no hematochezia, no hematuria, no melena, no shortness of breath and no sore throat          Nursing Notes, Triage Notes & Vital Signs were reviewed.      REVIEW OF SYSTEMS    (2-9 systems for level 4, 10 or more for level 5)     Review of Systems   Constitutional: Negative.  Negative for activity change, appetite change, chills, fatigue and fever.   HENT: Negative.  Negative for congestion, dental  problem, drooling, facial swelling, nosebleeds, postnasal drip, rhinorrhea, sinus pressure, sinus pain, sore throat, tinnitus, trouble swallowing and voice change.    Eyes: Negative.  Negative for photophobia, pain, discharge, redness, itching and visual disturbance.   Respiratory: Negative.  Negative for apnea, cough, choking, chest tightness, shortness of breath, wheezing and stridor.    Cardiovascular: Negative.  Negative for chest pain, palpitations and leg swelling.   Gastrointestinal: Positive for abdominal pain, anorexia, nausea and vomiting. Negative for blood in stool, constipation, diarrhea, flatus, hematemesis, hematochezia, melena and rectal pain.   Endocrine: Negative for polyuria.   Genitourinary: Negative.  Negative for dysuria, flank pain, frequency, hematuria and urgency.   Musculoskeletal: Negative.  Negative for arthralgias, back pain, gait problem, myalgias, neck pain and neck stiffness.   Skin: Negative.  Negative for rash.   Neurological: Negative.  Negative for dizziness, speech difficulty, light-headedness, numbness and headaches.   Psychiatric/Behavioral: Negative.  Negative for agitation, confusion, self-injury, sleep disturbance and suicidal ideas. The patient is not nervous/anxious.    All other systems reviewed and are negative.      Except as noted above the remainder of the review of systems was reviewed and negative.  PAST MEDICAL HISTORY     Past Medical History:   Diagnosis Date   ??? CAD (coronary artery disease)    ??? Chronic back pain     Had shots August 2019   ??? Diabetes [facility])     Diagnosed about 2009   ??? H/O cardiac catheterization 08/02/2017     severe single vessel CAD of the distal LCX which is small caliber and not amenable to PCI.   ??? H/O cardiovascular stress test 07/03/2017    scan shows moderate size, severe intensity, non reversible perfusion defect in inferolateral wall, normal LVEF.   ??? H/O Doppler lower arterial ultrasound 07/11/2017    The Right Proximal and mid  SFA exhibits an occlusion. The Left SFA exhibits an occlusion between the mid and distal portions. Right ABIs show Severe peripheral arterial disease.   ??? H/O echocardiogram 05/30/2017    Left ventricular systolic function is normal. Ejection fraction is visually estimated at >55%.   ??? Hyperlipidemia    ??? Hypertension    ??? Nerve pain    ??? PVD (peripheral vascular disease) ([place]) 08/02/2017     Severe lower extremity PAD. See abn Peripherial Angio. ( ABN Arterial US.07/11/17)   ??? Scoliosis        Prior to Admission medications    Medication Sig Start Date End Date Taking? Authorizing Provider   dicyclomine (BENTYL) 10 MG capsule Take 1 capsule by mouth every 6 hours as needed (Bowel spasms) 03/18/18 03/23/18 Yes Darvin Neighbours, DO   ondansetron (ZOFRAN-ODT) 4 MG disintegrating tablet Place 1 tablet under the tongue every 8 hours as needed for Nausea or Vomiting May Sub regular tablet (non-ODT) if insurance does not cover ODT. 03/18/18 03/25/18 Yes Darvin Neighbours, DO   sucralfate (CARAFATE) 1 GM tablet Take 1 tablet by mouth 4 times daily for 15 days 03/18/18 04/02/18 Yes Darvin Neighbours, DO   ciprofloxacin (CIPRO) 500 MG tablet Take 1 tablet by mouth 2 times daily for 7 days 03/18/18 03/25/18 Yes Darvin Neighbours, DO   ketorolac (TORADOL) 10 MG tablet Take 1 tablet by mouth every 6 hours as needed for Pain 03/11/18   Dawn Chalmers Guest, PA-C   aspirin 81 MG chewable tablet Take 1 tablet by mouth daily 03/05/18   Janyth Pupa, APRN - CNP   DULoxetine (CYMBALTA) 60 MG extended release capsule Take 60 mg by mouth daily    Historical Provider, MD   gabapentin (NEURONTIN) 400 MG capsule Take 400 mg by mouth 3 times daily.    Historical Provider, MD   pantoprazole (PROTONIX) 40 MG tablet Take 40 mg by mouth 2 times daily    Historical Provider, MD   B Complex-C-Folic Acid (DIALYVITE TABLET) TABS Take 1 tablet by mouth daily    Historical Provider, MD   tamsulosin (FLOMAX) 0.4 MG capsule Take 0.4 mg by mouth daily    Historical Provider,  MD   albuterol sulfate HFA 108 (90 Base) MCG/ACT inhaler Inhale 2 puffs into the lungs every 6 hours as needed for Wheezing    Historical Provider, MD   tiZANidine (ZANAFLEX) 4 MG tablet Take 4 mg by mouth 2 times daily     Historical Provider, MD   docusate sodium (COLACE) 100 MG capsule Take 1 capsule by mouth daily as needed     Historical Provider, MD   lisinopril (PRINIVIL;ZESTRIL) 20 MG tablet Take 20 mg by mouth daily    Historical Provider, MD   metFORMIN (GLUCOPHAGE) 1000 MG  tablet Take 1,000 mg by mouth 2 times daily (with meals)    Historical Provider, MD   hydrOXYzine (VISTARIL) 50 MG capsule Take 50 mg by mouth 3 times daily as needed for Itching    Historical Provider, MD   atorvastatin (LIPITOR) 20 MG tablet Take 20 mg by mouth daily    Historical Provider, MD        Patient Active Problem List   Diagnosis   ??? Chest pain   ??? Lower abdominal pain   ??? Anemia   ??? Abnormal stress test   ??? PAD (peripheral artery disease) ([place])   ??? AKI (acute kidney injury) ([place])   ??? Leukocytosis - chronic - patient reports CLL         SURGICAL HISTORY       Past Surgical History:   Procedure Laterality Date   ??? PR SONO GUIDE NEEDLE BIOPSY           CURRENT MEDICATIONS       Previous Medications    ALBUTEROL SULFATE HFA 108 (90 BASE) MCG/ACT INHALER    Inhale 2 puffs into the lungs every 6 hours as needed for Wheezing    ASPIRIN 81 MG CHEWABLE TABLET    Take 1 tablet by mouth daily    ATORVASTATIN (LIPITOR) 20 MG TABLET    Take 20 mg by mouth daily    B COMPLEX-C-FOLIC ACID (DIALYVITE TABLET) TABS    Take 1 tablet by mouth daily    DOCUSATE SODIUM (COLACE) 100 MG CAPSULE    Take 1 capsule by mouth daily as needed     DULOXETINE (CYMBALTA) 60 MG EXTENDED RELEASE CAPSULE    Take 60 mg by mouth daily    GABAPENTIN (NEURONTIN) 400 MG CAPSULE    Take 400 mg by mouth 3 times daily.    HYDROXYZINE (VISTARIL) 50 MG CAPSULE    Take 50 mg by mouth 3 times daily as needed for Itching    KETOROLAC (TORADOL) 10 MG TABLET    Take 1  tablet by mouth every 6 hours as needed for Pain    LISINOPRIL (PRINIVIL;ZESTRIL) 20 MG TABLET    Take 20 mg by mouth daily    METFORMIN (GLUCOPHAGE) 1000 MG TABLET    Take 1,000 mg by mouth 2 times daily (with meals)    PANTOPRAZOLE (PROTONIX) 40 MG TABLET    Take 40 mg by mouth 2 times daily    TAMSULOSIN (FLOMAX) 0.4 MG CAPSULE    Take 0.4 mg by mouth daily    TIZANIDINE (ZANAFLEX) 4 MG TABLET    Take 4 mg by mouth 2 times daily        ALLERGIES     Patient has no known allergies.    FAMILY HISTORY       Family History   Problem Relation Age of Onset   ??? Stroke Sister    ??? Heart Attack Brother           SOCIAL HISTORY       Social History     Socioeconomic History   ??? Marital status: Single     Spouse name: None   ??? Number of children: None   ??? Years of education: None   ??? Highest education level: None   Occupational History   ??? None   Social Needs   ??? Financial resource strain: None   ??? Food insecurity:     Worry: None     Inability: None   ???  Transportation needs:     Medical: None     Non-medical: None   Tobacco Use   ??? Smoking status: Current Every Day Smoker     Packs/day: 1.50     Types: Cigarettes   ??? Smokeless tobacco: Never Used   Substance and Sexual Activity   ??? Alcohol use: No   ??? Drug use: Yes     Frequency: 3.0 times per week     Types: Marijuana   ??? Sexual activity: None   Lifestyle   ??? Physical activity:     Days per week: None     Minutes per session: None   ??? Stress: None   Relationships   ??? Social connections:     Talks on phone: None     Gets together: None     Attends religious service: None     Active member of club or organization: None     Attends meetings of clubs or organizations: None     Relationship status: None   ??? Intimate partner violence:     Fear of current or ex partner: None     Emotionally abused: None     Physically abused: None     Forced sexual activity: None   Other Topics Concern   ??? None   Social History Narrative   ??? None       SCREENINGS    Glasgow Coma Scale  Eye  Opening: Spontaneous  Best Verbal Response: Oriented  Best Motor Response: Obeys commands  Glasgow Coma Scale Score: 15          PHYSICAL EXAM    (up to 7 for level 4, 8 or more for level 5)     ED Triage Vitals   BP Temp Temp Source Pulse Resp SpO2 Height Weight   03/18/18 1709 03/18/18 1710 03/18/18 1710 03/18/18 1709 03/18/18 1709 03/18/18 1709 03/18/18 1710 03/18/18 1710   134/87 98.1 ??F (36.7 ??C) Oral 93 18 98 % 5\' 6"  (1.676 m) 171 lb (77.6 kg)       Physical Exam  Vitals signs and nursing note reviewed.   Constitutional:       General: He is not in acute distress.     Appearance: Normal appearance. He is normal weight. He is not ill-appearing, toxic-appearing or diaphoretic.   HENT:      Head: Normocephalic and atraumatic.      Right Ear: Tympanic membrane, ear canal and external ear normal. There is no impacted cerumen.      Left Ear: Tympanic membrane, ear canal and external ear normal. There is no impacted cerumen.      Nose: Nose normal. No congestion or rhinorrhea.      Mouth/Throat:      Mouth: Mucous membranes are dry.      Pharynx: Oropharynx is clear. No oropharyngeal exudate or posterior oropharyngeal erythema.   Eyes:      General: No scleral icterus.        Right eye: No discharge.         Left eye: No discharge.      Extraocular Movements: Extraocular movements intact.      Conjunctiva/sclera: Conjunctivae normal.      Pupils: Pupils are equal, round, and reactive to light.   Neck:      Musculoskeletal: Normal range of motion and neck supple. No neck rigidity or muscular tenderness.      Vascular: No carotid bruit.   Cardiovascular:      Rate  and Rhythm: Normal rate.      Pulses: Normal pulses.      Heart sounds: Normal heart sounds. No murmur. No friction rub. No gallop.    Pulmonary:      Effort: Pulmonary effort is normal. No respiratory distress.      Breath sounds: Normal breath sounds. No stridor. No wheezing, rhonchi or rales.   Chest:      Chest wall: No tenderness.   Abdominal:       General: Abdomen is flat. Bowel sounds are normal. There is no distension.      Palpations: Abdomen is soft. There is no mass.      Tenderness: There is abdominal tenderness. There is guarding. There is no right CVA tenderness, left CVA tenderness or rebound.      Hernia: No hernia is present.   Musculoskeletal: Normal range of motion.         General: No swelling, tenderness, deformity or signs of injury.      Right lower leg: No edema.      Left lower leg: No edema.   Lymphadenopathy:      Cervical: No cervical adenopathy.   Skin:     Capillary Refill: Capillary refill takes less than 2 seconds.      Coloration: Skin is not jaundiced or pale.      Findings: No bruising, erythema, lesion or rash.   Neurological:      General: No focal deficit present.      Mental Status: He is alert and oriented to person, place, and time. Mental status is at baseline.      Cranial Nerves: No cranial nerve deficit.      Sensory: No sensory deficit.      Motor: No weakness.      Coordination: Coordination normal.      Gait: Gait normal.      Deep Tendon Reflexes: Reflexes normal.   Psychiatric:         Mood and Affect: Mood normal.         Behavior: Behavior normal.         Thought Content: Thought content normal.         Judgment: Judgment normal.         DIAGNOSTIC RESULTS     Labs Reviewed   CBC WITH AUTO DIFFERENTIAL - Abnormal; Notable for the following components:       Result Value    WBC 20.0 (*)     Hemoglobin 12.6 (*)     Hematocrit 39.4 (*)     RDW 16.7 (*)     Platelets 478 (*)     Lymphocytes % 53.5 (*)     Monocytes % 5.2 (*)     All other components within normal limits   COMPREHENSIVE METABOLIC PANEL - Abnormal; Notable for the following components:    Chloride 98 (*)     Glucose 209 (*)     AST 11 (*)     All other components within normal limits   LIPASE - Abnormal; Notable for the following components:    Lipase 71 (*)     All other components within normal limits   CK - Abnormal; Notable for the following  components:    Total CK 28 (*)     All other components within normal limits   TROPONIN   LACTIC ACID, PLASMA   URINALYSIS   URINE DRUG SCREEN          EKG: All EKG's  are interpreted by the Emergency Department Physician who either signs or Co-signs this chart in the absence of a cardiologist.       EKG Interpretation    Interpreted by emergency department physician    Rhythm: normal sinus   Rate: normal  Axis: normal  Ectopy: none  Conduction: normal  ST Segments: no acute change  T Waves: no acute change  Q Waves: none    Clinical Impression: no acute changes    Darvin Neighbours     RADIOLOGY:     Non-plain film images such as CT, Ultrasound and MRI are read by the radiologist. Plain radiographic images are visualized and preliminarily interpreted by the emergency physician.       Interpretation per the Radiologist below, if available at the time of this note:    XR CHEST STANDARD (2 VW)   Preliminary Result   No acute cardiopulmonary process.         CT ABDOMEN PELVIS W IV CONTRAST   Preliminary Result   1. Fluid-filled small bowel loops with mild mucosal enhancement, possibly a   mild enteritis.  No evidence of obstruction.  The appendix is unremarkable.   2. Otherwise unremarkable CT of the abdomen and pelvis.               ED BEDSIDE ULTRASOUND:   Performed by ED Physician Darvin Neighbours, DO       LABS:  Labs Reviewed   CBC WITH AUTO DIFFERENTIAL - Abnormal; Notable for the following components:       Result Value    WBC 20.0 (*)     Hemoglobin 12.6 (*)     Hematocrit 39.4 (*)     RDW 16.7 (*)     Platelets 478 (*)     Lymphocytes % 53.5 (*)     Monocytes % 5.2 (*)     All other components within normal limits   COMPREHENSIVE METABOLIC PANEL - Abnormal; Notable for the following components:    Chloride 98 (*)     Glucose 209 (*)     AST 11 (*)     All other components within normal limits   LIPASE - Abnormal; Notable for the following components:    Lipase 71 (*)     All other components within normal limits    CK - Abnormal; Notable for the following components:    Total CK 28 (*)     All other components within normal limits   TROPONIN   LACTIC ACID, PLASMA   URINALYSIS   URINE DRUG SCREEN       All other labs were within normal range or not returned as of this dictation.    EMERGENCY DEPARTMENT COURSE and DIFFERENTIAL DIAGNOSIS/MDM:   Vitals:    Vitals:    03/18/18 1709 03/18/18 1710   BP: 134/87    Pulse: 93    Resp: 18    Temp:  98.1 ??F (36.7 ??C)   TempSrc:  Oral   SpO2: 98%    Weight:  171 lb (77.6 kg)   Height:  5\' 6"  (1.676 m)           MDM  Number of Diagnoses or Management Options  Diagnosis management comments: 58 year old male presents to the emergency department for abdominal pain the patient reports is been present for 1 week.  Patient has been seen at the emergency department many times over the past several years for the same issue.  Patient has been  diagnosed with IBS.  Patient is on Linzess and reports that it does help.  Patient had come off of the Linzess due to insurance reasons but just started taking it again.  Patient reports severe excruciating abdominal pain.  CT scan shows possible mild enteritis with no other abnormalities.  Lab work is also negative.  Patient demands to be admitted to the hospital.  Will not admit patient to the hospital.  Will discharge patient home with Zofran, Bentyl.  Will start on ciprofloxacin for possible bacterial enteritis.  Vital signs are otherwise very stable.  Patient is showing manipulative behavior for pain medication.  Will refer to pain management.       Amount and/or Complexity of Data Reviewed  Clinical lab tests: reviewed and ordered  Tests in the radiology section of CPT??: ordered and reviewed  Tests in the medicine section of CPT??: ordered and reviewed  Decide to obtain previous medical records or to obtain history from someone other than the patient: yes    Risk of Complications, Morbidity, and/or Mortality  Presenting problems: moderate  Diagnostic  procedures: moderate  Management options: moderate    Critical Care  Total time providing critical care: < 30 minutes    Patient Progress  Patient progress: improved        REASSESSMENT          CRITICAL CARE TIME     Total critical care time provided today was 0 minutes. This excludes seperately billable procedures and family discussion time. Critical care time provided for obtaining history, conducting a physical exam, performing and monitoring interventions, ordering, collecting and interpreting tests, and establishing medical decision-making.  There was a potential for life/limb threatening pathology requiring close evaluation and intervention with concern for patient decompensation.    CONSULTS:  None    PROCEDURES:  None performed unless otherwise noted below     Procedures        FINAL IMPRESSION      1. Generalized abdominal pain    2. Enteritis          DISPOSITION/PLAN   DISPOSITION Decision To Discharge 03/18/2018 09:41:19 PM      PATIENT REFERRED TO:  Hazel Sams, APRN - CNP  [address]  [place] [address]  [phone]    In 3 days      Lanier Ensign, MD  [address]  [place] [address]  [phone]    In 3 days        DISCHARGE MEDICATIONS:  New Prescriptions    CIPROFLOXACIN (CIPRO) 500 MG TABLET    Take 1 tablet by mouth 2 times daily for 7 days    DICYCLOMINE (BENTYL) 10 MG CAPSULE    Take 1 capsule by mouth every 6 hours as needed (Bowel spasms)    ONDANSETRON (ZOFRAN-ODT) 4 MG DISINTEGRATING TABLET    Place 1 tablet under the tongue every 8 hours as needed for Nausea or Vomiting May Sub regular tablet (non-ODT) if insurance does not cover ODT.    SUCRALFATE (CARAFATE) 1 GM TABLET    Take 1 tablet by mouth 4 times daily for 15 days       ED Provider Disposition Time  DISPOSITION Decision To Discharge 03/18/2018 09:41:19 PM      The Patient was instructed to read the package inserts with any medication that was prescribed.  Major potential reactions and medication  interactions were discussed.  The Patient understands that there are numerous possible adverse reactions  not covered.    The patient was also instructed to arrange follow-up with his or her primary care provider for review of any pending labwork or incidental findings on any radiology results that were obtained.  All efforts were made to discuss any incidental findings that require further monitoring.      Controlled Substances Monitoring:     No flowsheet data found.    (Please note that portions of this note were completed with a voice recognition program.  Efforts were made to edit the dictations but occasionally words are mis-transcribed.)    Darvin Neighbours, DO (electronically signed)  Attending Emergency Physician            Darvin Neighbours, DO  03/18/18 2144

## 2018-03-19 LAB — LACTIC ACID: Lactate: 1.2 mMOL/L (ref 0.4–2.0)

## 2018-03-19 LAB — TROPONIN: Troponin T: <0.01 NG/ML (ref <0.01)

## 2018-03-19 MED ORDER — CIPROFLOXACIN HCL 500 MG PO TABS
500 MG | ORAL_TABLET | Freq: Two times a day (BID) | ORAL | 0 refills | Status: AC
Start: 2018-03-19 — End: 2018-03-25

## 2018-03-19 MED ORDER — DICYCLOMINE HCL 10 MG PO CAPS
10 MG | ORAL_CAPSULE | Freq: Four times a day (QID) | ORAL | 0 refills | Status: AC | PRN
Start: 2018-03-19 — End: 2018-11-07

## 2018-03-19 MED ORDER — SUCRALFATE 1 G PO TABS
1 GM | ORAL_TABLET | Freq: Four times a day (QID) | ORAL | 0 refills | Status: DC
Start: 2018-03-19 — End: 2018-11-07

## 2018-03-19 MED ORDER — IOPAMIDOL 76 % IV SOLN
76 % | Freq: Once | INTRAVENOUS | Status: AC | PRN
Start: 2018-03-19 — End: 2018-03-18
  Administered 2018-03-19: 02:00:00 80 mL via INTRAVENOUS

## 2018-03-19 MED ORDER — LACTATED RINGERS IV SOLN
Freq: Once | INTRAVENOUS | Status: AC
Start: 2018-03-19 — End: 2018-03-18
  Administered 2018-03-19: 02:00:00 1000 mL via INTRAVENOUS

## 2018-03-19 MED ORDER — HALOPERIDOL LACTATE 5 MG/ML IJ SOLN
5 MG/ML | Freq: Once | INTRAMUSCULAR | Status: AC
Start: 2018-03-19 — End: 2018-03-18
  Administered 2018-03-19: 02:00:00 5 mg via INTRAMUSCULAR

## 2018-03-19 MED ORDER — MORPHINE SULFATE (PF) 4 MG/ML IJ SOLN
4 MG/ML | INTRAMUSCULAR | Status: DC | PRN
Start: 2018-03-19 — End: 2018-03-19
  Administered 2018-03-19: 03:00:00 4 mg via INTRAVENOUS

## 2018-03-19 MED ORDER — NORMAL SALINE FLUSH 0.9 % IV SOLN
0.9 % | Freq: Two times a day (BID) | INTRAVENOUS | Status: DC
Start: 2018-03-19 — End: 2018-03-19
  Administered 2018-03-19: 02:00:00 10 mL via INTRAVENOUS

## 2018-03-19 MED ORDER — ONDANSETRON 4 MG PO TBDP
4 MG | ORAL_TABLET | Freq: Three times a day (TID) | ORAL | 0 refills | Status: AC | PRN
Start: 2018-03-19 — End: 2018-03-25

## 2018-03-19 MED ORDER — MORPHINE SULFATE (PF) 4 MG/ML IJ SOLN
4 MG/ML | INTRAMUSCULAR | Status: DC | PRN
Start: 2018-03-19 — End: 2018-03-19
  Administered 2018-03-19: 02:00:00 4 mg via INTRAVENOUS

## 2018-03-19 MED FILL — SODIUM CHLORIDE 0.9 % IV SOLN: 0.9 % | INTRAVENOUS | Qty: 1000

## 2018-03-19 MED FILL — ONDANSETRON HCL 4 MG/2ML IJ SOLN: 4 MG/2ML | INTRAMUSCULAR | Qty: 2

## 2018-03-19 MED FILL — HALOPERIDOL LACTATE 5 MG/ML IJ SOLN: 5 mg/mL | INTRAMUSCULAR | Qty: 1

## 2018-03-19 MED FILL — ISOVUE-370 76 % IV SOLN: 76 % | INTRAVENOUS | Qty: 100

## 2018-03-19 MED FILL — MORPHINE SULFATE 4 MG/ML IJ SOLN: 4 mg/mL | INTRAMUSCULAR | Qty: 1

## 2018-03-19 MED FILL — KETOROLAC TROMETHAMINE 30 MG/ML IJ SOLN: 30 mg/mL | INTRAMUSCULAR | Qty: 1

## 2018-03-19 MED FILL — LACTATED RINGERS IV SOLN: INTRAVENOUS | Qty: 1000

## 2018-03-28 ENCOUNTER — Ambulatory Visit
Admit: 2018-03-28 | Discharge: 2018-03-28 | Payer: PRIVATE HEALTH INSURANCE | Attending: Emergency Medicine | Primary: Family

## 2018-03-28 DIAGNOSIS — R42 Dizziness and giddiness: Secondary | ICD-10-CM

## 2018-03-28 NOTE — Progress Notes (Addendum)
[facility] Linard Millers, MD        OFFICE  FOLLOWUP NOTE    Chief complaints: patient is here for management of chest pain,??DM, HTN, ??DYSLPIDEMIA, PAD, lukemia    Subjective: presyncope    HPI Argenis is a 58 y.o.year old who  has a past medical history of CAD (coronary artery disease), Chronic back pain, Diabetes ([place]), H/O cardiac catheterization, H/O cardiovascular stress test, H/O Doppler lower arterial ultrasound, H/O echocardiogram, Hyperlipidemia, Hypertension, Nerve pain, PVD (peripheral vascular disease) ([place]), and Scoliosis. and presents for management of chest pain,??DM, HTN, ??DYSLPIDEMIA, PAD, lukemia which are well controlled  He is experiencing presyncope.he had same problem last time, and was taken to hospital and found to have blood glucose of 600.    Current Outpatient Medications   Medication Sig Dispense Refill   ??? dicyclomine (BENTYL) 10 MG capsule Take 1 capsule by mouth every 6 hours as needed (Bowel spasms) 20 capsule 0   ??? sucralfate (CARAFATE) 1 GM tablet Take 1 tablet by mouth 4 times daily for 15 days 60 tablet 0   ??? aspirin 81 MG chewable tablet Take 1 tablet by mouth daily 90 tablet 3   ??? DULoxetine (CYMBALTA) 60 MG extended release capsule Take 60 mg by mouth daily     ??? gabapentin (NEURONTIN) 400 MG capsule Take 400 mg by mouth 3 times daily.     ??? pantoprazole (PROTONIX) 40 MG tablet Take 40 mg by mouth 2 times daily     ??? B Complex-C-Folic Acid (DIALYVITE TABLET) TABS Take 1 tablet by mouth daily     ??? tamsulosin (FLOMAX) 0.4 MG capsule Take 0.4 mg by mouth daily     ??? albuterol sulfate HFA 108 (90 Base) MCG/ACT inhaler Inhale 2 puffs into the lungs every 6 hours as needed for Wheezing     ??? tiZANidine (ZANAFLEX) 4 MG tablet Take 4 mg by mouth 2 times daily      ??? docusate sodium (COLACE) 100 MG capsule Take 1 capsule by mouth daily as needed      ??? lisinopril (PRINIVIL;ZESTRIL) 20 MG tablet Take 20 mg by mouth daily     ??? metFORMIN (GLUCOPHAGE) 1000 MG tablet Take 1,000 mg by  mouth 2 times daily (with meals)     ??? hydrOXYzine (VISTARIL) 50 MG capsule Take 50 mg by mouth 3 times daily as needed for Itching     ??? atorvastatin (LIPITOR) 20 MG tablet Take 20 mg by mouth daily     ??? ketorolac (TORADOL) 10 MG tablet Take 1 tablet by mouth every 6 hours as needed for Pain (Patient not taking: Reported on 03/28/2018) 20 tablet 0     No current facility-administered medications for this visit.      Allergies: Patient has no known allergies.  Past Medical History:   Diagnosis Date   ??? CAD (coronary artery disease)    ??? Chronic back pain     Had shots August 2019   ??? Diabetes [facility])     Diagnosed about 2009   ??? H/O cardiac catheterization 08/02/2017     severe single vessel CAD of the distal LCX which is small caliber and not amenable to PCI.   ??? H/O cardiovascular stress test 07/03/2017    scan shows moderate size, severe intensity, non reversible perfusion defect in inferolateral wall, normal LVEF.   ??? H/O Doppler lower arterial ultrasound 07/11/2017    The Right Proximal and mid SFA exhibits an occlusion. The Left SFA  exhibits an occlusion between the mid and distal portions. Right ABIs show Severe peripheral arterial disease.   ??? H/O echocardiogram 05/30/2017    Left ventricular systolic function is normal. Ejection fraction is visually estimated at >55%.   ??? Hyperlipidemia    ??? Hypertension    ??? Nerve pain    ??? PVD (peripheral vascular disease) ([place]) 08/02/2017     Severe lower extremity PAD. See abn Peripherial Angio. ( ABN Arterial US.07/11/17)   ??? Scoliosis      Past Surgical History:   Procedure Laterality Date   ??? PR SONO GUIDE NEEDLE BIOPSY       Family History   Problem Relation Age of Onset   ??? Stroke Sister    ??? Heart Attack Brother      Social History     Tobacco Use   ??? Smoking status: Current Every Day Smoker     Packs/day: 1.50     Types: Cigarettes   ??? Smokeless tobacco: Never Used   Substance Use Topics   ??? Alcohol use: No      @ROSAKH @  Review of Systems:   ?? Constitutional: No  Fever or Weight Loss   ?? Eyes: No Decreased Vision  ?? ENT: No Headaches, Hearing Loss or Vertigo  ?? Cardiovascular: No chest pain, dyspnea on exertion, palpitations or loss of consciousness  ?? Respiratory: No cough or wheezing    ?? Gastrointestinal: No abdominal pain, appetite loss, blood in stools, constipation, diarrhea or heartburn  ?? Genitourinary: No dysuria, trouble voiding, or hematuria  ?? Musculoskeletal:  No gait disturbance, weakness or joint complaints  ?? Integumentary: No rash or pruritis  ?? Neurological: No TIA or stroke symptoms  ?? Psychiatric: No anxiety or depression  ?? Endocrine: No malaise, fatigue or temperature intolerance  ?? Hematologic/Lymphatic: No bleeding problems, blood clots or swollen lymph nodes  ?? Allergic/Immunologic: No nasal congestion or hives  All systems negative except as marked.   Objective:  BP 120/82    Pulse 68    Ht 5\' 6"  (1.676 m)    Wt 179 lb 9.6 oz (81.5 kg)    BMI 28.99 kg/m??    Orthostatic vitals sitting 120/70 heart rate 70  Standing 118/68 heart rate 70  Wt Readings from Last 3 Encounters:   03/28/18 179 lb 9.6 oz (81.5 kg)   03/18/18 171 lb (77.6 kg)   03/10/18 166 lb (75.3 kg)     Body mass index is 28.99 kg/m??.  GENERAL - Alert, oriented, pleasant, in no apparent distress,normal grooming  HEENT - pupils are reactive to light and accomodation, cornea intact, conjunctive normal color, ears are normal in exam,throat exam in normal, teeth, gum and palate are normal, oral mucosa is normal without any notation of pallor or cyanosis  Neck - Supple.  No jugular venous distention noted. No carotid bruits, no apical -carotid delay  Respiratory:  Normal breath sounds, No respiratory distress, No wheezing, No chest tenderness.,no use of accessory muscles, diaphragm movement is normal  Cardiovascular: (PMI) apex non displaced,no lifts no thrills, no s3,no s4, Normal heart rate, Normal rhythm, No murmurs, No rubs, No gallops. Carotid arteries pulse and amplitude are normal no  bruit, no abdominal bruit noted ( normal abdominal aorta ausculation), femoral arteries pulse and amplitude are normal no bruit,reduced pedal pulses   Femoral pulses have normal amplitude, no bruits   Extremities - No cyanosis, clubbing, or significant edema, no varicose veins    Abdomen - No masses, tenderness, or  organomegaly, no hepato-splenomegally, no bruits  Musculoskeletal - No significant edema, no kyphosis or scoliosis, no deformity in any extremity noted, muscle strength and tone are normal  Skin: no ulcer,no scar,no stasis dermatitis   Neurologic - alert oriented times 3,Cranial nerves II through XII are grossly intact.  There were no gross focal neurologic abnormalities. All sensory and motor nerves examined and were normal  Psychiatric: normal mood and affect    Lab Results   Component Value Date    CKTOTAL 28 03/18/2018     BNP:  No results found for: BNP  PT/INR:  No results found for: PTINR  Lab Results   Component Value Date    LABA1C 6.6 (H) 09/09/2017    LABA1C 7.2 (H) 05/31/2017     Lab Results   Component Value Date    CHOL 115 03/12/2017    TRIG 186 (H) 03/12/2017    HDL 43 03/12/2017     Lab Results   Component Value Date    ALT 11 03/18/2018    AST 11 (L) 03/18/2018     TSH:    Lab Results   Component Value Date    TSH 1.42 03/12/2017       Impression:  Shon is a 58 y.o.year old who  has a past medical history of CAD (coronary artery disease), Chronic back pain, Diabetes ([place]), H/O cardiac catheterization, H/O cardiovascular stress test, H/O Doppler lower arterial ultrasound, H/O echocardiogram, Hyperlipidemia, Hypertension, Nerve pain, PVD (peripheral vascular disease) ([place]), and Scoliosis. and presents with     Plan:  1. Presncope" with standing he is on cymbalta, neurontin and marijua ,will recommend to stop all these medications  2. Chest pain:distal LCX STENOSIS, medical treament is recommended  3. PAD: as per vascular sx, as per pateint he was taken off cath table at [facility] since he did not get clearance  4. PAIN in neck, back and legs: as per pain clinic  5. LUKEMIA, AS PER ONCOLOGIST  6. HTN: controlled,??continue ??Lisinopril  7. DM: CONTINUE insulin  8. Dyslipidemia; stable, continue statins  9. Health maintenance: exerise and diet  All labs, medications and tests reviewed, continue all other medications of all above medical condition listed as is.    @SIGNATURE @

## 2018-03-28 NOTE — Progress Notes (Signed)
Patient did not bring medication bottles or list. Medications were verbally verified with the patient. Patient was reminded to bring medication bottles to appointments.

## 2018-04-30 LAB — EKG 12-LEAD
Atrial Rate: 71 {beats}/min
Diagnosis: NORMAL
P Axis: 64 degrees
P-R Interval: 142 ms
Q-T Interval: 398 ms
QRS Duration: 82 ms
QTc Calculation (Bazett): 432 ms
R Axis: 18 degrees
T Axis: 65 degrees
Ventricular Rate: 71 {beats}/min

## 2018-05-13 ENCOUNTER — Ambulatory Visit: Payer: PRIVATE HEALTH INSURANCE | Primary: Family

## 2018-05-20 ENCOUNTER — Encounter: Attending: Internal Medicine | Primary: Family

## 2018-05-20 ENCOUNTER — Ambulatory Visit: Primary: Family

## 2018-06-07 ENCOUNTER — Inpatient Hospital Stay
Admit: 2018-06-07 | Discharge: 2018-06-07 | Disposition: A | Discharge status: Home / Self Care | Payer: PRIVATE HEALTH INSURANCE

## 2018-06-07 ENCOUNTER — Emergency Department: Admit: 2018-06-07 | Payer: PRIVATE HEALTH INSURANCE | Primary: Family

## 2018-06-07 DIAGNOSIS — R1084 Generalized abdominal pain: Secondary | ICD-10-CM

## 2018-06-07 LAB — CBC WITH AUTO DIFFERENTIAL
Basophils %: 0.3 % (ref 0–1)
Basophils Absolute: 0.1 10*3/uL
Eosinophils %: 0.1 % (ref 0–3)
Eosinophils Absolute: 0 10*3/uL
Hematocrit: 39.5 % — ABNORMAL LOW (ref 42–52)
Hemoglobin: 12.6 GM/DL — ABNORMAL LOW (ref 13.5–18.0)
Immature Neutrophil %: 0.5 % — ABNORMAL HIGH (ref 0–0.43)
Lymphocytes %: 30.9 % (ref 24–44)
Lymphocytes Absolute: 8.2 10*3/uL
MCH: 26.6 PG — ABNORMAL LOW (ref 27–31)
MCHC: 31.9 % — ABNORMAL LOW (ref 32.0–36.0)
MCV: 83.3 FL (ref 78–100)
MPV: 10 FL (ref 7.5–11.1)
Monocytes %: 2.8 % (ref 0–4)
Monocytes Absolute: 0.7 10*3/uL
Platelets: 384 10*3/uL (ref 140–440)
RBC: 4.74 10*6/uL (ref 4.6–6.2)
RDW: 18.6 % — ABNORMAL HIGH (ref 11.7–14.9)
Segs Absolute: 17.3 10*3/uL
Segs Relative: 65.4 % (ref 36–66)
Total Immature Neutrophil: 0.13 10*3/uL
WBC: 26.5 10*3/uL — ABNORMAL HIGH (ref 4.0–10.5)

## 2018-06-07 LAB — COMPREHENSIVE METABOLIC PANEL
ALT: 14 U/L (ref 10–40)
AST: 13 IU/L — ABNORMAL LOW (ref 15–37)
Albumin: 4.7 GM/DL (ref 3.4–5.0)
Alkaline Phosphatase: 78 IU/L (ref 40–129)
Anion Gap: 15 (ref 4–16)
BUN: 16 MG/DL (ref 6–23)
CO2: 24 MMOL/L (ref 21–32)
Calcium: 10 MG/DL (ref 8.3–10.6)
Chloride: 100 mMol/L (ref 99–110)
Creatinine: 1.1 MG/DL (ref 0.9–1.3)
GFR African American: >60 mL/min/{1.73_m2} (ref >60)
GFR Non-African American: >60 mL/min/{1.73_m2} (ref >60)
Glucose: 221 MG/DL — ABNORMAL HIGH (ref 70–99)
Potassium: 4.9 MMOL/L (ref 3.5–5.1)
Sodium: 139 MMOL/L (ref 135–145)
Total Bilirubin: 0.2 MG/DL (ref 0.0–1.0)
Total Protein: 7.6 GM/DL (ref 6.4–8.2)

## 2018-06-07 LAB — URINALYSIS
Bacteria, UA: NEGATIVE /HPF
Bilirubin Urine: NEGATIVE MG/DL
Blood, Urine: NEGATIVE
Glucose, Urine: 50 MG/DL — AB
Ketones, Urine: NEGATIVE MG/DL
Leukocyte Esterase, Urine: NEGATIVE
Nitrite Urine, Quantitative: NEGATIVE
Protein, UA: NEGATIVE MG/DL
RBC, UA: 6 /HPF — ABNORMAL HIGH (ref 0–3)
Specific Gravity, UA: 1.036 — ABNORMAL HIGH (ref 1.001–1.035)
Squam Epithel, UA: <1 /HPF
Trichomonas, UA: NONE SEEN /HPF
Urobilinogen, Urine: NORMAL MG/DL (ref 0.2–1.0)
WBC, UA: <1 /HPF (ref 0–2)
pH, Urine: 5 (ref 5.0–8.0)

## 2018-06-07 LAB — LIPASE: Lipase: 26 IU/L (ref 13–60)

## 2018-06-07 LAB — LACTIC ACID
Lactate: 4.1 mMOL/L (ref 0.4–2.0)
Lactate: 2.5 mMOL/L (ref 0.4–2.0)

## 2018-06-07 MED ORDER — KETOROLAC TROMETHAMINE 30 MG/ML IJ SOLN
30 MG/ML | Freq: Once | INTRAMUSCULAR | Status: AC
Start: 2018-06-07 — End: 2018-06-07
  Administered 2018-06-07: 21:00:00 30 mg via INTRAVENOUS

## 2018-06-07 MED ORDER — MORPHINE SULFATE (PF) 4 MG/ML IJ SOLN
4 MG/ML | INTRAMUSCULAR | Status: DC | PRN
Start: 2018-06-07 — End: 2018-06-07
  Administered 2018-06-07 (×3): 4 mg via INTRAVENOUS

## 2018-06-07 MED ORDER — HYDROCODONE-ACETAMINOPHEN 5-325 MG PO TABS
5-325 MG | ORAL_TABLET | Freq: Three times a day (TID) | ORAL | 0 refills | Status: DC | PRN
Start: 2018-06-07 — End: 2018-06-12

## 2018-06-07 MED ORDER — SODIUM CHLORIDE 0.9 % IV BOLUS
0.9 % | Freq: Once | INTRAVENOUS | Status: AC
Start: 2018-06-07 — End: 2018-06-07
  Administered 2018-06-07: 17:00:00 1000 mL via INTRAVENOUS

## 2018-06-07 MED ORDER — ONDANSETRON HCL 4 MG/2ML IJ SOLN
4 MG/2ML | INTRAMUSCULAR | Status: DC | PRN
Start: 2018-06-07 — End: 2018-06-07
  Administered 2018-06-07: 16:00:00 4 mg via INTRAVENOUS

## 2018-06-07 MED ORDER — ONDANSETRON 4 MG PO TBDP
4 MG | ORAL_TABLET | Freq: Three times a day (TID) | ORAL | 0 refills | Status: DC | PRN
Start: 2018-06-07 — End: 2018-11-07

## 2018-06-07 MED ORDER — SODIUM CHLORIDE 0.9 % IV BOLUS
0.9 % | Freq: Once | INTRAVENOUS | Status: AC
Start: 2018-06-07 — End: 2018-06-07
  Administered 2018-06-07: 16:00:00 1000 mL via INTRAVENOUS

## 2018-06-07 MED ORDER — IOPAMIDOL 76 % IV SOLN
76 % | Freq: Once | INTRAVENOUS | Status: AC | PRN
Start: 2018-06-07 — End: 2018-06-07
  Administered 2018-06-07: 17:00:00 80 mL via INTRAVENOUS

## 2018-06-07 MED FILL — MORPHINE SULFATE 4 MG/ML IJ SOLN: 4 mg/mL | INTRAMUSCULAR | Qty: 1

## 2018-06-07 MED FILL — KETOROLAC TROMETHAMINE 30 MG/ML IJ SOLN: 30 MG/ML | INTRAMUSCULAR | Qty: 1

## 2018-06-07 MED FILL — ISOVUE-370 76 % IV SOLN: 76 % | INTRAVENOUS | Qty: 100

## 2018-06-07 MED FILL — ONDANSETRON HCL 4 MG/2ML IJ SOLN: 4 MG/2ML | INTRAMUSCULAR | Qty: 2

## 2018-06-07 NOTE — ED Provider Notes (Addendum)
eMERGENCY dEPARTMENT eNCOUnter         [facility]     PCP: Kyra Manges ADAMS, [place]    Chief Complaint   Patient presents with   ??? Abdominal Pain     3-4 Days       HPI    Ricardo Foley is a 58 y.o. male who presents with generalized abdominal pain nausea and vomiting.  Onset is prior travel, x3 to 4 days ago.  Context is patient denies any recent travel sick contacts new foods medications or antibiotic use.  States he believes he is having either a flare of his IBS or chronic pancreatitis.  Does follow with local GI but is been sometime since he seen them.  He is on Linzess for chronic constipation as well as Bentyl at baseline.  He reports generalized abdominal pain, worse across lower abdomen.  Pain is a constant sharp throbbing 6/10 pain that worsens with palpation as well as eating.  No changes in his bowel movement.  Has had frequent episodes of nonbilious/nonbloody vomiting.  No coffee-ground emesis or hematemesis.  Denies previous history of abdominal surgery or known bowel obstruction.  Denies any pain rating to the back, no tearing or ripping sensation history of AAA.  States this feels similar in location to his chronic abdominal pain but more intense over the last several days and has not been able to keep anything down secondary to pain.  Adamantly denying any chest pain shortness of breath dizziness or lightheadedness.  No cough, fever or chills.  Denying any dysuria hematuria history of kidney stones.  He does admit to a previous history of alcohol abuse but states he has been off alcohol for many years.  Denies any other illicit drug use. Reports history of CLL.        REVIEW OF SYSTEMS    Constitutional:  Denies fever, chills, weight loss or weakness   HENT:  Denies sore throat or ear pain   Cardiovascular:  Denies chest pain, palpitations or swelling   Respiratory:  Denies cough or shortness of breath   GI:  See HPI  above  GU: No hematuria or dysuria.   Musculoskeletal:  Denies back pain or groin pain or masses.  No pain or swelling of extremities.  Skin:  Denies rash  Neurologic:  Denies headache, focal weakness or sensory changes   Endocrine:  Denies polyuria or polydypsia   Lymphatic:  Denies swollen glands     All other review of systems are negative  See HPI and nursing notes for additional information     PAST MEDICAL & SURGICAL HISTORY    Past Medical History:   Diagnosis Date   ??? CAD (coronary artery disease)    ??? Chronic back pain     Had shots August 2019   ??? Diabetes [facility])     Diagnosed about 2009   ??? H/O cardiac catheterization 08/02/2017     severe single vessel CAD of the distal LCX which is small caliber and not amenable to PCI.   ??? H/O cardiovascular stress test 07/03/2017    scan shows moderate size, severe intensity, non reversible perfusion defect in inferolateral wall, normal LVEF.   ??? H/O Doppler lower arterial ultrasound 07/11/2017    The Right Proximal and mid SFA exhibits an occlusion. The Left SFA exhibits an occlusion between the mid and distal portions. Right ABIs show Severe peripheral arterial  disease.   ??? H/O echocardiogram 05/30/2017    Left ventricular systolic function is normal. Ejection fraction is visually estimated at >55%.   ??? Hyperlipidemia    ??? Hypertension    ??? Nerve pain    ??? PVD (peripheral vascular disease) ([place]) 08/02/2017     Severe lower extremity PAD. See abn Peripherial Angio. ( ABN Arterial US.07/11/17)   ??? Scoliosis      Past Surgical History:   Procedure Laterality Date   ??? PR SONO GUIDE NEEDLE BIOPSY         CURRENT MEDICATIONS    Current Outpatient Rx   Medication Sig Dispense Refill   ??? HYDROcodone-acetaminophen (NORCO) 5-325 MG per tablet Take 1 tablet by mouth every 8 hours as needed for Pain for up to 3 days. 9 tablet 0   ??? ondansetron (ZOFRAN ODT) 4 MG disintegrating tablet Take 1 tablet by mouth every 8 hours as needed for Nausea 15 tablet 0   ??? dicyclomine (BENTYL) 10  MG capsule Take 1 capsule by mouth every 6 hours as needed (Bowel spasms) 20 capsule 0   ??? sucralfate (CARAFATE) 1 GM tablet Take 1 tablet by mouth 4 times daily for 15 days 60 tablet 0   ??? ketorolac (TORADOL) 10 MG tablet Take 1 tablet by mouth every 6 hours as needed for Pain (Patient not taking: Reported on 03/28/2018) 20 tablet 0   ??? aspirin 81 MG chewable tablet Take 1 tablet by mouth daily 90 tablet 3   ??? DULoxetine (CYMBALTA) 60 MG extended release capsule Take 60 mg by mouth daily     ??? gabapentin (NEURONTIN) 400 MG capsule Take 400 mg by mouth 3 times daily.     ??? pantoprazole (PROTONIX) 40 MG tablet Take 40 mg by mouth 2 times daily     ??? B Complex-C-Folic Acid (DIALYVITE TABLET) TABS Take 1 tablet by mouth daily     ??? tamsulosin (FLOMAX) 0.4 MG capsule Take 0.4 mg by mouth daily     ??? albuterol sulfate HFA 108 (90 Base) MCG/ACT inhaler Inhale 2 puffs into the lungs every 6 hours as needed for Wheezing     ??? tiZANidine (ZANAFLEX) 4 MG tablet Take 4 mg by mouth 2 times daily      ??? docusate sodium (COLACE) 100 MG capsule Take 1 capsule by mouth daily as needed      ??? lisinopril (PRINIVIL;ZESTRIL) 20 MG tablet Take 20 mg by mouth daily     ??? metFORMIN (GLUCOPHAGE) 1000 MG tablet Take 1,000 mg by mouth 2 times daily (with meals)     ??? hydrOXYzine (VISTARIL) 50 MG capsule Take 50 mg by mouth 3 times daily as needed for Itching     ??? atorvastatin (LIPITOR) 20 MG tablet Take 20 mg by mouth daily         ALLERGIES    No Known Allergies    SOCIAL AND FAMILY HISTORY    Social History     Socioeconomic History   ??? Marital status: Single     Spouse name: None   ??? Number of children: None   ??? Years of education: None   ??? Highest education level: None   Occupational History   ??? None   Social Needs   ??? Financial resource strain: None   ??? Food insecurity     Worry: None     Inability: None   ??? Transportation needs     Medical: None     Non-medical:  None   Tobacco Use   ??? Smoking status: Current Every Day Smoker      Packs/day: 1.50     Types: Cigarettes   ??? Smokeless tobacco: Never Used   Substance and Sexual Activity   ??? Alcohol use: No   ??? Drug use: Yes     Frequency: 14.0 times per week     Types: Marijuana   ??? Sexual activity: None   Lifestyle   ??? Physical activity     Days per week: None     Minutes per session: None   ??? Stress: None   Relationships   ??? Social Product manager on phone: None     Gets together: None     Attends religious service: None     Active member of club or organization: None     Attends meetings of clubs or organizations: None     Relationship status: None   ??? Intimate partner violence     Fear of current or ex partner: None     Emotionally abused: None     Physically abused: None     Forced sexual activity: None   Other Topics Concern   ??? None   Social History Narrative   ??? None     Family History   Problem Relation Age of Onset   ??? Stroke Sister    ??? Heart Attack Brother        PHYSICAL EXAM    VITAL SIGNS: BP 124/74    Pulse 72    Temp 98.5 ??F (36.9 ??C)    Resp 17    Ht 5\' 6"  (1.676 m)    Wt 164 lb (74.4 kg)    SpO2 98%    BMI 26.47 kg/m??   General:  Well developed, well nourished, In no acute distress  Eyes:  Sclera nonicteric, Conjunctiva moist, No discharge  Head:  Normocephalic, Atramautic  Neck/Lymphatics: Supple, no JVD, no swollen nodes  Respiratory:  Clear to ausculation bilaterally, No retractions, Non labored breathing  Cardiovascular:  RRR, No murmurs/gallops/thrills  GI:   No gross discoloration.  Bowel sounds present in all quadrants, No audible bruits. Soft,  Nondistended. + Generalized moderate abdominal tenderness, greatest across the lower abdomen and right lower quadrant with guarding.  No rebound tenderness.  No hepatosplenomegaly.  Negative Murphy sign.   No palpable pulsatile masses or obvious hernias.   Back:  No CVA tenderness to percussion.  Musculoskeletal:  No edema, No deformity  Peripheral Vascular: Distal pulses 2+ equal bilaterally  Integument: No rash, Normal  turgor  Neurologic:  Alert & oriented, Normal speech  Psychiatric: Cooperative, pleasant affect       I have reviewed and interpreted all of the currently available lab results from this visit (if applicable):  Results for orders placed or performed during the hospital encounter of 06/07/18   CBC auto diff   Result Value Ref Range    WBC 26.5 (H) 4.0 - 10.5 K/CU MM    RBC 4.74 4.6 - 6.2 M/CU MM    Hemoglobin 12.6 (L) 13.5 - 18.0 GM/DL    Hematocrit 39.5 (L) 42 - 52 %    MCV 83.3 78 - 100 FL    MCH 26.6 (L) 27 - 31 PG    MCHC 31.9 (L) 32.0 - 36.0 %    RDW 18.6 (H) 11.7 - 14.9 %    Platelets 384 140 - 440 K/CU MM    MPV 10.0 7.5 -  11.1 FL    Immature Neutrophil % 0.5 (H) 0 - 0.43 %    Segs Relative 65.4 36 - 66 %    Eosinophils % 0.1 0 - 3 %    Basophils % 0.3 0 - 1 %    Lymphocytes % 30.9 24 - 44 %    Monocytes % 2.8 0 - 4 %    Total Immature Neutrophil 0.13 K/CU MM    Segs Absolute 17.3 K/CU MM    Eosinophils Absolute 0.0 K/CU MM    Basophils Absolute 0.1 K/CU MM    Lymphocytes Absolute 8.2 K/CU MM    Monocytes Absolute 0.7 K/CU MM    Differential Type       AUTOMATED DIFF RESULTS CONFIRMED BY SMEAR REVIEW  AUTOMATED DIFFERENTIAL      Anisocytosis 1+     Polychromasia 1+     Ovalocytes 1+     PLT Morphology FEW    CMP   Result Value Ref Range    Sodium 139 135 - 145 MMOL/L    Potassium 4.9 3.5 - 5.1 MMOL/L    Chloride 100 99 - 110 mMol/L    CO2 24 21 - 32 MMOL/L    BUN 16 6 - 23 MG/DL    CREATININE 1.1 0.9 - 1.3 MG/DL    Glucose 221 (H) 70 - 99 MG/DL    Calcium 10.0 8.3 - 10.6 MG/DL    Alb 4.7 3.4 - 5.0 GM/DL    Total Protein 7.6 6.4 - 8.2 GM/DL    Total Bilirubin 0.2 0.0 - 1.0 MG/DL    ALT 14 10 - 40 U/L    AST 13 (L) 15 - 37 IU/L    Alkaline Phosphatase 78 40 - 129 IU/L    GFR Non-African American >60 >60 mL/min/1.6m2    GFR African American >60 >60 mL/min/1.35m2    Anion Gap 15 4 - 16   Lipase   Result Value Ref Range    Lipase 26 13 - 60 IU/L   Urinalysis   Result Value Ref Range    Color, UA YELLOW YELLOW     Clarity, UA CLEAR CLEAR    Glucose, Urine 50 (A) NEGATIVE MG/DL    Bilirubin Urine NEGATIVE NEGATIVE MG/DL    Ketones, Urine NEGATIVE NEGATIVE MG/DL    Specific Gravity, UA 1.036 (H) 1.001 - 1.035    Blood, Urine NEGATIVE NEGATIVE    pH, Urine 5.0 5.0 - 8.0    Protein, UA NEGATIVE NEGATIVE MG/DL    Urobilinogen, Urine NORMAL 0.2 - 1.0 MG/DL    Nitrite Urine, Quantitative NEGATIVE NEGATIVE    Leukocyte Esterase, Urine NEGATIVE NEGATIVE    RBC, UA 6 (H) 0 - 3 /HPF    WBC, UA <1 0 - 2 /HPF    Bacteria, UA NEGATIVE NEGATIVE /HPF    Squam Epithel, UA <1 /HPF    Mucus, UA RARE (A) NEGATIVE HPF    Trichomonas, UA NONE SEEN NONE SEEN /HPF   Lactic Acid, Plasma   Result Value Ref Range    Lactate 4.1 (HH) 0.4 - 2.0 mMOL/L   Lactic Acid, Plasma   Result Value Ref Range    Lactate 2.5 (HH) 0.4 - 2.0 mMOL/L        RADIOLOGY/PROCEDURES    ??   CT ABDOMEN PELVIS W IV CONTRAST (Final result)   Result time 06/07/18 15:18:59   Final result by Corene Cornea, MD (06/07/18 15:18:59)  Impression:    1. No acute intra-abdominal abnormality to account for the patient's symptoms.  2. Moderate to severe atherosclerosis.            Narrative:    EXAMINATION:  CT OF THE ABDOMEN AND PELVIS WITH CONTRAST 06/07/2018 1:07 pm    TECHNIQUE:  CT of the abdomen and pelvis was performed with the administration of  intravenous contrast. Multiplanar reformatted images are provided for review.  Dose modulation, iterative reconstruction, and/or weight based adjustment of  the mA/kV was utilized to reduce the radiation dose to as low as reasonably  achievable.    COMPARISON:  March 18, 2018    HISTORY:  ORDERING SYSTEM PROVIDED HISTORY: abdominal pain  TECHNOLOGIST PROVIDED HISTORY:  IV contrast only. Thank you.  Reason for exam:->abdominal pain  Reason for exam:->IV contrast only. Thank you.  Reason for Exam: abdominal pain  Acuity: Acute  Type of Exam: Initial  Relevant Medical/Surgical History: 80 ML ISOVUEV 370    FINDINGS:  Lower  Chest: No focal consolidation or pleural effusion.    Organs: The liver, gallbladder, spleen, pancreas, adrenal glands, and kidneys  demonstrate no acute abnormality. ??Bilateral ureters are normal in course and  caliber. ??No ureteral calculi.    GI/Bowel: The stomach, small bowel, and colon are normal in course and  caliber without evidence of wall thickening or obstruction. ??Normal appendix.    Pelvis: Normal bladder. ??Prostate and seminal vesicles are unremarkable.    Peritoneum/Retroperitoneum: No free fluid or free air. ??No pathologic  lymphadenopathy. ??Aorta and its branches are patent and normal in course and  caliber with moderate to severe atherosclerosis.    Bones/Soft Tissues: No acute or aggressive osseous lesion.                     ED COURSE & MEDICAL DECISION MAKING      Vital signs and nursing notes reviewed during ED course.   I have independently evaluated this patient .  Supervising MD - Dr Genia Harold - present in the Emergency Department, available for consultation, throughout entirety of  patient care. All pertinent Lab data and radiographic results reviewed with patient at bedside.      The patient and / or the family were informed of the results of any tests, a time was given to answer questions, a plan was proposed and they agreed with plan.         Differential diagnosis: Abdominal Aortic Aneurysm, Ischemic Bowel, Bowel Obstruction, Acute Cholecystitis, Acute Appendicitis, other    Clinical  IMPRESSION    1. Generalized abdominal pain    2. Non-intractable vomiting with nausea, unspecified vomiting type    3. Leukocytosis, unspecified type    4. Personal history of CLL (chronic lymphocytic leukemia)        Patient presents for gradual onset of generalized abdominal pain nausea and vomiting with history of similar.  On exam, mildly uncomfortable male, nontoxic afebrile in no acute distress.  Abdomen is soft, nondistended, equal bowel sounds throughout all quadrants, nondistended and soft but  there is generalized abdominal tenderness, greatest across lower abdomen right lower quadrant with guarding.  No rebound tenderness.  No hepatosplenomegaly.  No CVA tenderness percussion.  Lungs are clear to auscultation.  Patient is kept n.p.o. while in the ED.  Start IV fluids, morphine and Zofran.  CBC with a leukocytosis of 26.5, hemoglobin is 12.6.  No left shift.  On comparative labs, patient does appear to have a baseline leukocytosis  but numbers today are more elevated than usual but he does have a history of CLL.  Also elevated lactic acid of 4.1.  CMP with elevated glucose of 221 but normal anion gap, CO2 and no electrolyte disturbance.  Normal lipase.  UA is unremarkable, no bacteria leukocytes or nitrites. CT abdomen pelvis contrast imaging reveals moderate to severe atherosclerosis without evidence of acute abdominal pelvic process or bowel obstruction. Following fluids and pain medications, patient is reporting improvement of pain, is tolerating p.o is down trended to 2.5.. Repeat lactic acid discussed with patient that I suspect his elevated lactic acid is secondary to likely starvation ketosis and is improving after rigorous IV fluids.  Presentation is concerning for possible acute on chronic abdominal pain versus IBS flare versus other gastritis/gastroenteritis.  I did recommend/discussed admission for trending of lactic acid levels, continued pain control and IV fluids.  Patient states that as he is feeling improved, he would like to try going home with symptomatic care especially given the COVID-19 infection risk while hospitalized.  I do think is reasonable I have given him a low threshold to return back to the ED with any new worsening symptoms, intractable nausea or vomiting or changes mind about admission.  I estimate there is low risk for acute appendicitis, bowel obstruction, cholecystitis, ruptured diverticulitis, incarcerated hernia, hemorrhagic pancreatitis, or perforated bowel/ulcer,  thus I consider the discharge disposition reasonable.  Patient is discharged at this time stable condition with short course of Vicodin for pain as well as Zofran.  States he is already scheduled to follow-up with his GI doctor next week.  Educated that I cannot completely rule out other etiology earliness process and will need serial abdominal exams either with PCP or back in the next 12 to 24 hours.  Educated push clear fluids, rest and a bland/brat diet with gradual advancement to normal diet as tolerated.  I discussed the unclear etiology of patient's symptoms today and the need for return to emergency department in 8-12 hours for repeat evaluation, sooner if symptoms worsen or any new symptoms develop.      Patient agrees to return emergency department if symptoms worsen or any new symptoms develop.        Attending physician - Dr. Genia Harold - was consulted on this case, but did not independently evaluate the patient         In light of current events, I did utilize appropriate PPE (including N95 face mask, safety glasses, gloves, as recommended by the health facility/national standard best practice, during my bedside interactions with the patient.      Comment: Please note this report has been produced using speech recognition software and may contain errors related to that system including errors in grammar, punctuation, and spelling, as well as words and phrases that may be inappropriate. If there are any questions or concerns please feel free to contact the dictating provider for clarification.          Asa Saunas, PA-C  06/07/18 [place], PA-C  06/07/18 1736

## 2018-06-07 NOTE — ED Notes (Signed)
Pt tolerates crackers and ice water well.     Erasmo Downer, RN  06/07/18 [phone]

## 2018-06-07 NOTE — Discharge Instructions (Signed)
Home Care Instructions: Abdominal Pain     Many things may cause abdominal pain.  Your ER visit might not show the exact reason you are having pain.  In some cases, additional time is needed to determine if the cause is serious. Therefore you may be told to go home and watch for any changes or worsening in your condition.  Before that, we may not know if you need more testing, or if hospitalization or surgery is necessary.  If it's not something serious, the pain may go away without treatment or get better with simple things like avoiding certain foods or medications.    In the ER, your doctor asks you questions, examines you and in some cases, may order tests.  These help doctors decide if the pain is from something serious.  Tests are not always done and may not provide a definite answer.  There can still be a problem, even with normal test results.   Abdominal pain may be caused by something serious (like appendicitis), which is not obvious right away.  Because of this, another checkup is needed to make sure you are OK.  It is VERY IMPORTANT to follow up for a repeat exam, especially if you have any symptoms that are not going away or are getting worse.   We recommend that you RETURN TO THE EMERGENCY ROOM IN 8-12 HOURS to be rechecked.  If you cannot, you may follow up with your primary care doctor or clinic.                        It is important that you follow all of the instructions below.  RETURN TO THE EMERGENCY ROOM IMMEDIATELY IF:   The pain does not go away or gets worse.       You have a fever.   You keep throwing up and cannot keep anything down.   You pass bloody or black stools.    HOME CARE INSTRUCTIONS   Come back to the ER (or see your doctor) in 8-12 hours.   DO NOT take laxatives unless directed by your doctor.   Avoid the use of alcohol  Take pain medicine only as directed by your doctor.  Only take over-the-counter or prescription medicine as directed by your doctor.   Try a clear liquid diet  (broth, tea, jello, water) for the next 12-24 hours. Slowly move to a bland diet as tolerated.  Do not eat greasy, fatty or spicy foods.  Once you start getting better, go back to a normal, healthy diet, slowly over a few days.        You develop new symptoms.       EMP 2014

## 2018-06-10 ENCOUNTER — Inpatient Hospital Stay
Admit: 2018-06-10 | Discharge: 2018-06-12 | Disposition: A | Discharge status: Home / Self Care | Payer: PRIVATE HEALTH INSURANCE | Source: Ambulatory Visit | Admitting: Internal Medicine

## 2018-06-10 ENCOUNTER — Emergency Department: Admit: 2018-06-10 | Payer: PRIVATE HEALTH INSURANCE | Primary: Family

## 2018-06-10 DIAGNOSIS — K859 Acute pancreatitis without necrosis or infection, unspecified: Secondary | ICD-10-CM

## 2018-06-10 LAB — COMPREHENSIVE METABOLIC PANEL
ALT: 16 U/L (ref 10–40)
AST: 14 IU/L — ABNORMAL LOW (ref 15–37)
Albumin: 4.6 GM/DL (ref 3.4–5.0)
Alkaline Phosphatase: 66 IU/L (ref 40–129)
Anion Gap: 13 (ref 4–16)
BUN: 9 MG/DL (ref 6–23)
CO2: 23 MMOL/L (ref 21–32)
Calcium: 9.2 MG/DL (ref 8.3–10.6)
Chloride: 101 mMol/L (ref 99–110)
Creatinine: 0.8 MG/DL — ABNORMAL LOW (ref 0.9–1.3)
GFR African American: >60 mL/min/{1.73_m2} (ref >60)
GFR Non-African American: >60 mL/min/{1.73_m2} (ref >60)
Glucose: 209 MG/DL — ABNORMAL HIGH (ref 70–99)
Potassium: 3.9 MMOL/L (ref 3.5–5.1)
Sodium: 137 MMOL/L (ref 135–145)
Total Bilirubin: 0.3 MG/DL (ref 0.0–1.0)
Total Protein: 7.2 GM/DL (ref 6.4–8.2)

## 2018-06-10 LAB — CBC WITH AUTO DIFFERENTIAL
Bands Absolute: 0.36 10*3/uL
Bands Relative: 2 % — ABNORMAL LOW (ref 5–11)
Hematocrit: 39.8 % — ABNORMAL LOW (ref 42–52)
Hemoglobin: 12.6 GM/DL — ABNORMAL LOW (ref 13.5–18.0)
Lymphocytes %: 40 % (ref 24–44)
Lymphocytes Absolute: 7.2 10*3/uL
MCH: 26.6 PG — ABNORMAL LOW (ref 27–31)
MCHC: 31.7 % — ABNORMAL LOW (ref 32.0–36.0)
MCV: 84.1 FL (ref 78–100)
MPV: 9.5 FL (ref 7.5–11.1)
Monocytes %: 5 % — ABNORMAL HIGH (ref 0–4)
Monocytes Absolute: 0.9 10*3/uL
Platelets: 348 10*3/uL (ref 140–440)
RBC: 4.73 10*6/uL (ref 4.6–6.2)
RDW: 18.5 % — ABNORMAL HIGH (ref 11.7–14.9)
Segs Absolute: 9.4 10*3/uL
Segs Relative: 53 % (ref 36–66)
WBC: 17.9 10*3/uL — ABNORMAL HIGH (ref 4.0–10.5)

## 2018-06-10 LAB — URINALYSIS
Bacteria, UA: NEGATIVE /HPF
Bilirubin Urine: NEGATIVE MG/DL
Blood, Urine: NEGATIVE
Glucose, Urine: 50 MG/DL — AB
Ketones, Urine: NEGATIVE MG/DL
Leukocyte Esterase, Urine: NEGATIVE
Nitrite Urine, Quantitative: NEGATIVE
Protein, UA: NEGATIVE MG/DL
RBC, UA: 6 /HPF — ABNORMAL HIGH (ref 0–3)
Specific Gravity, UA: 1.044 — ABNORMAL HIGH (ref 1.001–1.035)
Trichomonas, UA: NONE SEEN /HPF
Urobilinogen, Urine: NORMAL MG/DL (ref 0.2–1.0)
WBC, UA: NONE SEEN /HPF (ref 0–2)
pH, Urine: 6 (ref 5.0–8.0)

## 2018-06-10 LAB — LACTIC ACID: Lactate: 2 mMOL/L (ref 0.4–2.0)

## 2018-06-10 LAB — URINE DRUG SCREEN
Amphetamines: NEGATIVE
Barbiturate Screen, Ur: NEGATIVE
Benzodiazepine Screen, Urine: POSITIVE — AB
Cannabinoid Scrn, Ur: POSITIVE — AB
Cocaine Metabolite: POSITIVE — AB
Opiates, Urine: POSITIVE — AB
Oxycodone: NEGATIVE
Phencyclidine, Urine: NEGATIVE

## 2018-06-10 LAB — LIPASE: Lipase: 546 IU/L — ABNORMAL HIGH (ref 13–60)

## 2018-06-10 LAB — POCT GLUCOSE: POC Glucose: 159 MG/DL — ABNORMAL HIGH (ref 70–99)

## 2018-06-10 MED ORDER — ONDANSETRON HCL 4 MG/2ML IJ SOLN
4 MG/2ML | INTRAMUSCULAR | Status: DC | PRN
Start: 2018-06-10 — End: 2018-06-10
  Administered 2018-06-10: 17:00:00 4 mg via INTRAVENOUS

## 2018-06-10 MED ORDER — PROMETHAZINE HCL 25 MG PO TABS
25 MG | Freq: Four times a day (QID) | ORAL | Status: DC | PRN
Start: 2018-06-10 — End: 2018-06-12

## 2018-06-10 MED ORDER — ACETAMINOPHEN 650 MG RE SUPP
650 MG | Freq: Four times a day (QID) | RECTAL | Status: DC | PRN
Start: 2018-06-10 — End: 2018-06-12

## 2018-06-10 MED ORDER — GLUCOSE 40 % PO GEL
40 % | ORAL | Status: DC | PRN
Start: 2018-06-10 — End: 2018-06-12

## 2018-06-10 MED ORDER — DICYCLOMINE HCL 10 MG/ML IM SOLN
10 MG/ML | Freq: Once | INTRAMUSCULAR | Status: AC
Start: 2018-06-10 — End: 2018-06-10
  Administered 2018-06-10: 17:00:00 20 mg via INTRAMUSCULAR

## 2018-06-10 MED ORDER — LIDOCAINE VISCOUS HCL 2 % MT SOLN
2 % | Freq: Once | OROMUCOSAL | Status: AC
Start: 2018-06-10 — End: 2018-06-10
  Administered 2018-06-10: 17:00:00 15 mL via OROMUCOSAL

## 2018-06-10 MED ORDER — PANTOPRAZOLE SODIUM 40 MG PO TBEC
40 MG | Freq: Two times a day (BID) | ORAL | Status: DC
Start: 2018-06-10 — End: 2018-06-12
  Administered 2018-06-11 – 2018-06-12 (×4): 40 mg via ORAL

## 2018-06-10 MED ORDER — SUCRALFATE 1 G PO TABS
1 GM | Freq: Four times a day (QID) | ORAL | Status: DC
Start: 2018-06-10 — End: 2018-06-12
  Administered 2018-06-10 – 2018-06-12 (×7): 1 g via ORAL

## 2018-06-10 MED ORDER — CAPSAICIN 0.025 % EX CREA
0.025 % | Freq: Two times a day (BID) | CUTANEOUS | Status: DC
Start: 2018-06-10 — End: 2018-06-12
  Administered 2018-06-10 – 2018-06-12 (×4): via TOPICAL

## 2018-06-10 MED ORDER — MIRTAZAPINE 15 MG PO TABS
15 MG | Freq: Every evening | ORAL | Status: DC
Start: 2018-06-10 — End: 2018-06-12
  Administered 2018-06-11 – 2018-06-12 (×2): 30 mg via ORAL

## 2018-06-10 MED ORDER — LISINOPRIL 20 MG PO TABS
20 MG | Freq: Every day | ORAL | Status: DC
Start: 2018-06-10 — End: 2018-06-12
  Administered 2018-06-11 – 2018-06-12 (×2): 20 mg via ORAL

## 2018-06-10 MED ORDER — DEXTROSE 50 % IV SOLN
50 % | INTRAVENOUS | Status: DC | PRN
Start: 2018-06-10 — End: 2018-06-12

## 2018-06-10 MED ORDER — GABAPENTIN 400 MG PO CAPS
400 MG | Freq: Three times a day (TID) | ORAL | Status: DC
Start: 2018-06-10 — End: 2018-06-12
  Administered 2018-06-11 – 2018-06-12 (×5): 400 mg via ORAL

## 2018-06-10 MED ORDER — IOPAMIDOL 76 % IV SOLN
76 % | Freq: Once | INTRAVENOUS | Status: AC | PRN
Start: 2018-06-10 — End: 2018-06-10
  Administered 2018-06-10: 17:00:00 80 mL via INTRAVENOUS

## 2018-06-10 MED ORDER — POTASSIUM CHLORIDE 10 MEQ/100ML IV SOLN
10 MEQ/0ML | INTRAVENOUS | Status: DC | PRN
Start: 2018-06-10 — End: 2018-06-12

## 2018-06-10 MED ORDER — GLUCAGON HCL RDNA (DIAGNOSTIC) 1 MG IJ SOLR
1 MG | INTRAMUSCULAR | Status: DC | PRN
Start: 2018-06-10 — End: 2018-06-12

## 2018-06-10 MED ORDER — TAMSULOSIN HCL 0.4 MG PO CAPS
0.4 MG | Freq: Every day | ORAL | Status: DC
Start: 2018-06-10 — End: 2018-06-12
  Administered 2018-06-11 – 2018-06-12 (×2): 0.4 mg via ORAL

## 2018-06-10 MED ORDER — DICYCLOMINE HCL 10 MG PO CAPS
10 MG | Freq: Four times a day (QID) | ORAL | Status: DC | PRN
Start: 2018-06-10 — End: 2018-06-12
  Administered 2018-06-11 – 2018-06-12 (×2): 10 mg via ORAL

## 2018-06-10 MED ORDER — FAMOTIDINE 20 MG/2ML IV SOLN
202 MG/2ML | Freq: Once | INTRAVENOUS | Status: AC
Start: 2018-06-10 — End: 2018-06-10
  Administered 2018-06-10: 17:00:00 20 mg via INTRAVENOUS

## 2018-06-10 MED ORDER — HYDROMORPHONE HCL 2 MG/ML IJ SOLN
2 MG/ML | INTRAMUSCULAR | Status: DC | PRN
Start: 2018-06-10 — End: 2018-06-10

## 2018-06-10 MED ORDER — MORPHINE SULFATE (PF) 4 MG/ML IJ SOLN
4 MG/ML | INTRAMUSCULAR | Status: DC | PRN
Start: 2018-06-10 — End: 2018-06-10
  Administered 2018-06-10: 17:00:00 4 mg via INTRAVENOUS

## 2018-06-10 MED ORDER — DEXTROSE 5 % IV SOLN
5 % | INTRAVENOUS | Status: DC | PRN
Start: 2018-06-10 — End: 2018-06-12

## 2018-06-10 MED ORDER — LACTATED RINGERS IV SOLN
INTRAVENOUS | Status: DC
Start: 2018-06-10 — End: 2018-06-12
  Administered 2018-06-10 – 2018-06-12 (×5): via INTRAVENOUS

## 2018-06-10 MED ORDER — IPRATROPIUM-ALBUTEROL 0.5-2.5 (3) MG/3ML IN SOLN
RESPIRATORY_TRACT | Status: DC | PRN
Start: 2018-06-10 — End: 2018-06-12

## 2018-06-10 MED ORDER — SODIUM CHLORIDE 0.9 % IV BOLUS
0.9 % | Freq: Once | INTRAVENOUS | Status: AC
Start: 2018-06-10 — End: 2018-06-10
  Administered 2018-06-10: 18:00:00 1000 mL via INTRAVENOUS

## 2018-06-10 MED ORDER — ONDANSETRON HCL 4 MG/2ML IJ SOLN
4 MG/2ML | Freq: Four times a day (QID) | INTRAMUSCULAR | Status: DC | PRN
Start: 2018-06-10 — End: 2018-06-12

## 2018-06-10 MED ORDER — HYDROMORPHONE HCL 2 MG/ML IJ SOLN
2 MG/ML | INTRAMUSCULAR | Status: DC | PRN
Start: 2018-06-10 — End: 2018-06-11
  Administered 2018-06-10 – 2018-06-11 (×6): 0.5 mg via INTRAVENOUS

## 2018-06-10 MED ORDER — ATORVASTATIN CALCIUM 20 MG PO TABS
20 MG | Freq: Every day | ORAL | Status: DC
Start: 2018-06-10 — End: 2018-06-11
  Administered 2018-06-11: 01:00:00 20 mg via ORAL

## 2018-06-10 MED ORDER — ASPIRIN 81 MG PO CHEW
81 MG | Freq: Every day | ORAL | Status: DC
Start: 2018-06-10 — End: 2018-06-12
  Administered 2018-06-11 – 2018-06-12 (×2): 81 mg via ORAL

## 2018-06-10 MED ORDER — TIZANIDINE HCL 4 MG PO TABS
4 MG | Freq: Two times a day (BID) | ORAL | Status: DC
Start: 2018-06-10 — End: 2018-06-12
  Administered 2018-06-11 – 2018-06-12 (×3): 4 mg via ORAL

## 2018-06-10 MED ORDER — THERAPEUTIC MULTIVIT/MINERAL PO TABS
Freq: Every day | ORAL | Status: DC
Start: 2018-06-10 — End: 2018-06-12
  Administered 2018-06-11 – 2018-06-12 (×2): 1 via ORAL

## 2018-06-10 MED ORDER — NORMAL SALINE FLUSH 0.9 % IV SOLN
0.9 | Freq: Two times a day (BID) | INTRAVENOUS | Status: DC
Start: 2018-06-10 — End: 2018-06-12
  Administered 2018-06-11 – 2018-06-12 (×4): 10 mL via INTRAVENOUS

## 2018-06-10 MED ORDER — INSULIN LISPRO 100 UNIT/ML SC SOLN
100 UNIT/ML | SUBCUTANEOUS | Status: DC
Start: 2018-06-10 — End: 2018-06-11
  Administered 2018-06-11: 15:00:00 3 [IU] via SUBCUTANEOUS

## 2018-06-10 MED ORDER — POLYETHYLENE GLYCOL 3350 17 G PO PACK
17 g | Freq: Every day | ORAL | Status: DC | PRN
Start: 2018-06-10 — End: 2018-06-12

## 2018-06-10 MED ORDER — ACETAMINOPHEN 325 MG PO TABS
325 MG | Freq: Four times a day (QID) | ORAL | Status: DC | PRN
Start: 2018-06-10 — End: 2018-06-12

## 2018-06-10 MED ORDER — ALUM & MAG HYDROXIDE-SIMETH 200-200-20 MG/5ML PO SUSP
200-200-20 MG/5ML | Freq: Once | ORAL | Status: AC
Start: 2018-06-10 — End: 2018-06-10
  Administered 2018-06-10: 17:00:00 30 mL via ORAL

## 2018-06-10 MED ORDER — CAPSAICIN 0.075 % EX CREA
0.075 % | Freq: Three times a day (TID) | CUTANEOUS | Status: DC
Start: 2018-06-10 — End: 2018-06-10

## 2018-06-10 MED ORDER — ENOXAPARIN SODIUM 40 MG/0.4ML SC SOLN
40 | Freq: Every day | SUBCUTANEOUS | Status: DC
Start: 2018-06-10 — End: 2018-06-12
  Administered 2018-06-10: 21:00:00 40 mg via SUBCUTANEOUS

## 2018-06-10 MED ORDER — DULOXETINE HCL 30 MG PO CPEP
30 MG | Freq: Every day | ORAL | Status: DC
Start: 2018-06-10 — End: 2018-06-12
  Administered 2018-06-11 – 2018-06-12 (×2): 60 mg via ORAL

## 2018-06-10 MED ORDER — NORMAL SALINE FLUSH 0.9 % IV SOLN
0.9 % | INTRAVENOUS | Status: DC | PRN
Start: 2018-06-10 — End: 2018-06-12

## 2018-06-10 MED FILL — ONDANSETRON HCL 4 MG/2ML IJ SOLN: 4 MG/2ML | INTRAMUSCULAR | Qty: 2

## 2018-06-10 MED FILL — MAG-AL PLUS 200-200-20 MG/5ML PO LIQD: 200-200-20 MG/5ML | ORAL | Qty: 30

## 2018-06-10 MED FILL — LOVENOX 40 MG/0.4ML SC SOLN: 40 MG/0.4ML | SUBCUTANEOUS | Qty: 0.4

## 2018-06-10 MED FILL — HYDROMORPHONE HCL 2 MG/ML IJ SOLN: 2 MG/ML | INTRAMUSCULAR | Qty: 1

## 2018-06-10 MED FILL — SUCRALFATE 1 G PO TABS: 1 GM | ORAL | Qty: 1

## 2018-06-10 MED FILL — FAMOTIDINE 20 MG/2ML IV SOLN: 20 MG/2ML | INTRAVENOUS | Qty: 2

## 2018-06-10 MED FILL — LIDOCAINE VISCOUS HCL 2 % MT SOLN: 2 % | OROMUCOSAL | Qty: 15

## 2018-06-10 MED FILL — ARTHRITIS PAIN RELIEVING 0.075 % EX CREA: 0.075 % | CUTANEOUS | Qty: 57

## 2018-06-10 MED FILL — BENTYL 10 MG/ML IM SOLN: 10 MG/ML | INTRAMUSCULAR | Qty: 2

## 2018-06-10 MED FILL — CAPSAICIN 0.025 % EX CREA: 0.025 % | CUTANEOUS | Qty: 60

## 2018-06-10 MED FILL — MORPHINE SULFATE 4 MG/ML IJ SOLN: 4 mg/mL | INTRAMUSCULAR | Qty: 1

## 2018-06-10 MED FILL — HUMALOG 100 UNIT/ML SC SOLN: 100 UNIT/ML | SUBCUTANEOUS | Qty: 3

## 2018-06-10 MED FILL — ISOVUE-370 76 % IV SOLN: 76 % | INTRAVENOUS | Qty: 100

## 2018-06-10 NOTE — ED Notes (Signed)
Bed 3106 ready      Sharon Stapel  06/10/18 1429

## 2018-06-10 NOTE — ED Notes (Signed)
Pt presents to the ED today with c/o abdominal pain that started several days ago.  Pt states that he was recently seen in the ED and symptoms have progressed.      Milus Mallick, RN  06/10/18 1037

## 2018-06-10 NOTE — ED Notes (Signed)
[name] and lactic acid was drawn on patient in triage     Kyley Laurel K. Micaila Ziemba  06/10/18 1105

## 2018-06-10 NOTE — ED Notes (Signed)
Care assumed for lunch coverage.     Tacy Learn, RN  06/10/18 1255

## 2018-06-10 NOTE — H&P (Addendum)
History and Physical      Name:  Ricardo Foley DOB/Age/Sex: 1961-01-21  (58 y.o. male)   MRN & CSN:  [id number] & [id number] Admission Date/Time: 06/10/2018 11:25 AM   Location:  3106/3106-A PCP: Kyra Manges [facility], [facility] Day: 1    History of Present Illness:     Chief Complaint: Ricardo Foley is a 58 y.o.  male with Hx of CLL, HTN, depression/anxiety, Type 2 DM, PVD who presents with Complaints of abdominal pain, nausea or vomiting for 5 days. Has been having 1-2 times a day. Generalized abdominal pain 10/10, sharp-dull pain, try to use heating pads, hot shower and bentyl, usually help pain but this time did not help with the pain. For IBS takes Linzess but he do not have it as waiting for it to be approved by insurance. He used to be a heavy drinker but do not drink anymore, quit 5-6 years. Has been having Marijuana, last use was yesterday.   He has been having bout of it every 2-3 months.  On arrival to ER with the initial work up showed elevated lipase with CT abdomen and pelvis showed Mild to moderate pancreatic atrophy with some fatty replacement.  Ten point ROS reviewed negative, unless as noted above    ROS  Constitutional: No fever, no chill, +weight loss; good appetite  HEENT: No blurred vision, no ear problems, no sore throat, no running nose.  Respiratory: No cough, no sputum, no pleuritic chest pain, no shortness of breath  Cardiology: No angina, no dyspnea on exertion, no paroxysmal nocturnal dyspnea, no orthopnea, no palpitation, no leg swelling.   Gastroenterology: No dysphagia, no reflux; + abdominal pain, + nausea and vomiting; no constipation or diarrhea. No blood in stool.    Genitourinary: No dysuria, no frequency, hesitancy; no hematuria  Musculoskeletal: no joint pain, no myalgia, no change in range of movement  Neurology: no focal weakness in extremities, no slurred speech, no double vision, no tingling or numbness sensation.   Endocrinology: no temperature  intolerance, no polyphagia, polydipsia or polyuria  Hematology: no increased bleeding, no bruising, no lymphadenopathy  Skin: no skin change noticed by patient  Psychology: no depressed mood, no suicidal ideation  Objective:   No intake or output data in the 24 hours ending 06/10/18 1550   Vitals:   Vitals:    06/10/18 1402   BP: (!) 185/98   Pulse:    Resp:    Temp:    SpO2: 98%     Physical Exam:   General Appearance: alert and oriented to person, place and time, in no acute distress  Cardiovascular: normal rate, regular rhythm, normal S1 and S2,  Pulmonary/Chest: clear to auscultation bilaterally  Abdomen: rigid, + diffuse abdominal tenderness, non-distended, normal bowel sounds, no masses   Extremities: no cyanosis, clubbing or edema, pulse   Skin: warm and dry, no rash or erythema  Head: normocephalic and atraumatic  Eyes: pupils equal, round, and reactive to light  Neck: supple and non-tender without mass, no thyromegaly   Musculoskeletal: normal range of motion, no joint swelling, deformity or tenderness  Neurological: alert, oriented, normal speech, no focal findings or movement disorder noted    Past Medical History:      Past Medical History:   Diagnosis Date   ??? CAD (coronary artery disease)    ??? Chronic back pain     Had shots August 2019   ??? Diabetes ([place])  Diagnosed about 2009   ??? H/O cardiac catheterization 08/02/2017     severe single vessel CAD of the distal LCX which is small caliber and not amenable to PCI.   ??? H/O cardiovascular stress test 07/03/2017    scan shows moderate size, severe intensity, non reversible perfusion defect in inferolateral wall, normal LVEF.   ??? H/O Doppler lower arterial ultrasound 07/11/2017    The Right Proximal and mid SFA exhibits an occlusion. The Left SFA exhibits an occlusion between the mid and distal portions. Right ABIs show Severe peripheral arterial disease.   ??? H/O echocardiogram 05/30/2017    Left ventricular systolic function is normal. Ejection fraction  is visually estimated at >55%.   ??? Hyperlipidemia    ??? Hypertension    ??? Nerve pain    ??? PVD (peripheral vascular disease) ([place]) 08/02/2017     Severe lower extremity PAD. See abn Peripherial Angio. ( ABN Arterial US.07/11/17)   ??? Scoliosis      PSHX:  has a past surgical history that includes pr sono guide needle biopsy.    Allergies: No Known Allergies    FAM HX: family history includes Heart Attack in his brother; Stroke in his sister.  Soc HX:   Social History     Socioeconomic History   ??? Marital status: Single     Spouse name: None   ??? Number of children: None   ??? Years of education: None   ??? Highest education level: None   Occupational History   ??? None   Social Needs   ??? Financial resource strain: None   ??? Food insecurity     Worry: None     Inability: None   ??? Transportation needs     Medical: None     Non-medical: None   Tobacco Use   ??? Smoking status: Current Every Day Smoker     Packs/day: 1.50     Types: Cigarettes   ??? Smokeless tobacco: Never Used   Substance and Sexual Activity   ??? Alcohol use: No   ??? Drug use: Yes     Frequency: 14.0 times per week     Types: Marijuana   ??? Sexual activity: None   Lifestyle   ??? Physical activity     Days per week: None     Minutes per session: None   ??? Stress: None   Relationships   ??? Social Product manager on phone: None     Gets together: None     Attends religious service: None     Active member of club or organization: None     Attends meetings of clubs or organizations: None     Relationship status: None   ??? Intimate partner violence     Fear of current or ex partner: None     Emotionally abused: None     Physically abused: None     Forced sexual activity: None   Other Topics Concern   ??? None   Social History Narrative   ??? None       Medications:   Medications:   ??? capsaicin   Topical BID   ??? aspirin  81 mg Oral Daily   ??? atorvastatin  20 mg Oral Daily   ??? therapeutic multivitamin-minerals  1 tablet Oral Daily   ??? DULoxetine  60 mg Oral Daily   ??? gabapentin   400 mg Oral TID   ??? lisinopril  20 mg Oral Daily   ???  pantoprazole  40 mg Oral BID   ??? sucralfate  1 g Oral 4x Daily   ??? tamsulosin  0.4 mg Oral Daily   ??? tiZANidine  4 mg Oral BID   ??? sodium chloride flush  10 mL Intravenous 2 times per day   ??? enoxaparin  40 mg Subcutaneous Daily   ??? insulin lispro  0-6 Units Subcutaneous Q4H   ??? mirtazapine  30 mg Oral Nightly      Infusions:   ??? lactated ringers     ??? dextrose       PRN Meds: ipratropium-albuterol, 1 ampule, Q4H PRN  dicyclomine, 10 mg, Q6H PRN  sodium chloride flush, 10 mL, PRN  acetaminophen, 650 mg, Q6H PRN    Or  acetaminophen, 650 mg, Q6H PRN  polyethylene glycol, 17 g, Daily PRN  promethazine, 12.5 mg, Q6H PRN    Or  ondansetron, 4 mg, Q6H PRN  potassium chloride, 10 mEq, PRN  HYDROmorphone, 0.5 mg, Q4H PRN  glucose, 15 g, PRN  dextrose, 12.5 g, PRN  glucagon (rDNA), 1 mg, PRN  dextrose, 100 mL/hr, PRN        Labs:     CBC:   Lab Results   Component Value Date    WBC 17.9 06/10/2018    RBC 4.73 06/10/2018    HGB 12.6 06/10/2018    HCT 39.8 06/10/2018    PLT 348 06/10/2018    MCV 84.1 06/10/2018     BMP:    Lab Results   Component Value Date    NA 137 06/10/2018    K 3.9 06/10/2018    CL 101 06/10/2018    CO2 23 06/10/2018    BUN 9 06/10/2018    CREATININE 0.8 06/10/2018    GLUCOSE 209 06/10/2018    CALCIUM 9.2 06/10/2018     Hepatic Function Panel:    Lab Results   Component Value Date    ALKPHOS 66 06/10/2018    AST 14 06/10/2018    ALT 16 06/10/2018    PROT 7.2 06/10/2018    LABALBU 4.6 06/10/2018    BILITOT 0.3 06/10/2018     Magnesium:    Lab Results   Component Value Date    MG 1.7 09/09/2017     Cardiac Enzymes:   Lab Results   Component Value Date    CKTOTAL 28 (L) 03/18/2018    CKTOTAL 58 09/08/2017     LDH:    Lab Results   Component Value Date    LDH 121 01/31/2018     PT/INR:    Lab Results   Component Value Date    PROTIME 12.7 07/31/2017    INR 1.12 07/31/2017     U/A:   Lab Results   Component Value Date    LEUKOCYTESUR NEGATIVE 06/07/2018     WBCUA <1 06/07/2018    RBCUA 6 06/07/2018    BACTERIA NEGATIVE 06/07/2018    SPECGRAV 1.036 06/07/2018    BLOODU NEGATIVE 06/07/2018     ABG:  No results found for: PHART, PCO2ART, PO2ART, O2SATART, HCO3ART, BEART  TSH:    Lab Results   Component Value Date    TSH 1.42 03/12/2017     Cardiac Enzymes:   Lab Results   Component Value Date    CKTOTAL 28 (L) 03/18/2018    CKTOTAL 58 09/08/2017       Assessment and Plan:   Ricardo Foley is a 58 y.o.  male  who presents with abdominal  pain, nausea and vomiting    Abdominal pain  Acute on chronic pancreatitis, mild  On admission, lipase 546  -Abdominal/pelvis CT showed ??Mild to moderate pancreatic atrophy with some fatty replacement  -CT scan of the abdomen showed some material in the stomach, GI consult placed  -Monitor BMP  -NPO   -IVF    -Pain control   -Will try oral feeds tomorrow  -Consult to GI    IBS  GI consult placed    Leukocytosis-Chronic  Likely from CLL    Type 2 DM  Sliding scale insulin  Hypoglycemia precautions    Normocytic anemia, Anemia of chronic disorder    Uncontrolled HTN    Diet Diet NPO Effective Now   DVT Prophylaxis [x]  Lovenox, []   Heparin, []  SCDs, []  Ambulation   GI Prophylaxis [x]  PPI,  []  H2 Blocker,  []  Carafate,  []  Diet/Tube Feeds   Code Status Full Code   Disposition Patient requires continued admission due to Pancreatitis   MDM []  Low, [x]  Moderate,[]   High       Electronically signed by Janeal Holmes, MD on 06/10/2018 at 3:50 PM

## 2018-06-10 NOTE — ED Notes (Signed)
[id number]     Ricardo Foley  06/10/18 1334

## 2018-06-10 NOTE — ED Notes (Signed)
Report given to [facility], denies further questions.     Laqueta Linden, RN  06/10/18 [phone]

## 2018-06-10 NOTE — ED Provider Notes (Addendum)
[facility]      TRIAGE CHIEF COMPLAINT:   Abdominal Pain (states that he was seen a few day ago with the same symptom which has not improved) and Nausea      HOPI:  Ricardo Foley is a 58 y.o. male that presents with complaint of abdominal pain, nausea.  History of IBS, diverticulitis he was seen here couple days ago for the same complaint had a negative work-up except for leukocytosis.  He has a history of CML not on chemo, radiation.  He states he is been taking medication he was given but not helping constant pain nausea and denies any fevers chest pain shortness of breath urine complaints no other questions or concerns.    REVIEW OF SYSTEMS:  At least 10 systems reviewed and otherwise acutely negative except as in the HOPI.    Review of Systems   Constitutional: Negative.    HENT: Negative.    Eyes: Negative.    Respiratory: Negative.    Cardiovascular: Negative.    Gastrointestinal: Positive for abdominal pain, nausea and vomiting.   Endocrine: Negative.    Genitourinary: Negative.    Musculoskeletal: Negative.    Skin: Negative.    Allergic/Immunologic: Negative.    Neurological: Negative.    Hematological: Negative.    Psychiatric/Behavioral: Negative.    All other systems reviewed and are negative.      Past Medical History:   Diagnosis Date   . CAD (coronary artery disease)    . Chronic back pain     Had shots August 2019   . Diabetes (HCC)     Diagnosed about 2009   . H/O cardiac catheterization 08/02/2017     severe single vessel CAD of the distal LCX which is small caliber and not amenable to PCI.   Marland Kitchen H/O cardiovascular stress test 07/03/2017    scan shows moderate size, severe intensity, non reversible perfusion defect in inferolateral wall, normal LVEF.   Marland Kitchen H/O Doppler lower arterial ultrasound 07/11/2017    The Right Proximal and mid SFA exhibits an occlusion. The Left SFA exhibits an occlusion between the mid and distal portions. Right ABIs show Severe peripheral arterial  disease.   . H/O echocardiogram 05/30/2017    Left ventricular systolic function is normal. Ejection fraction is visually estimated at >55%.   Marland Kitchen Hyperlipidemia    . Hypertension    . Nerve pain    . PVD (peripheral vascular disease) (HCC) 08/02/2017     Severe lower extremity PAD. See abn Peripherial Angio. ( ABN Arterial US.07/11/17)   . Scoliosis      Past Surgical History:   Procedure Laterality Date   . PR SONO GUIDE NEEDLE BIOPSY       Family History   Problem Relation Age of Onset   . Stroke Sister    . Heart Attack Brother      Social History     Socioeconomic History   . Marital status: Single     Spouse name: Not on file   . Number of children: Not on file   . Years of education: Not on file   . Highest education level: Not on file   Occupational History   . Not on file   Social Needs   . Financial resource strain: Not on file   . Food insecurity     Worry: Not on file     Inability: Not on file   . Transportation needs  Medical: Not on file     Non-medical: Not on file   Tobacco Use   . Smoking status: Current Every Day Smoker     Packs/day: 1.50     Types: Cigarettes   . Smokeless tobacco: Never Used   Substance and Sexual Activity   . Alcohol use: No   . Drug use: Yes     Frequency: 14.0 times per week     Types: Marijuana   . Sexual activity: Not on file   Lifestyle   . Physical activity     Days per week: Not on file     Minutes per session: Not on file   . Stress: Not on file   Relationships   . Social Wellsite geologist on phone: Not on file     Gets together: Not on file     Attends religious service: Not on file     Active member of club or organization: Not on file     Attends meetings of clubs or organizations: Not on file     Relationship status: Not on file   . Intimate partner violence     Fear of current or ex partner: Not on file     Emotionally abused: Not on file     Physically abused: Not on file     Forced sexual activity: Not on file   Other Topics Concern   . Not on file   Social  History Narrative   . Not on file     Current Facility-Administered Medications   Medication Dose Route Frequency Provider Last Rate Last Dose   . ondansetron (ZOFRAN) injection 4 mg  4 mg Intravenous Q30 Min PRN Fayne Mediate, DO   4 mg at 06/10/18 1232   . morphine sulfate (PF) injection 4 mg  4 mg Intravenous Q30 Min PRN Fayne Mediate, DO   4 mg at 06/10/18 1232   . capsaicin (ZOSTRIX) 0.025 % cream   Topical BID Fayne Mediate, DO       . HYDROmorphone (DILAUDID) injection 1 mg  1 mg Intravenous Q30 Min PRN Sonia Bromell, DO       . 0.9 % sodium chloride bolus  1,000 mL Intravenous Once Fayne Mediate, DO         Current Outpatient Medications   Medication Sig Dispense Refill   . HYDROcodone-acetaminophen (NORCO) 5-325 MG per tablet Take 1 tablet by mouth every 8 hours as needed for Pain for up to 3 days. 9 tablet 0   . ondansetron (ZOFRAN ODT) 4 MG disintegrating tablet Take 1 tablet by mouth every 8 hours as needed for Nausea 15 tablet 0   . dicyclomine (BENTYL) 10 MG capsule Take 1 capsule by mouth every 6 hours as needed (Bowel spasms) 20 capsule 0   . sucralfate (CARAFATE) 1 GM tablet Take 1 tablet by mouth 4 times daily for 15 days 60 tablet 0   . ketorolac (TORADOL) 10 MG tablet Take 1 tablet by mouth every 6 hours as needed for Pain (Patient not taking: Reported on 03/28/2018) 20 tablet 0   . aspirin 81 MG chewable tablet Take 1 tablet by mouth daily 90 tablet 3   . DULoxetine (CYMBALTA) 60 MG extended release capsule Take 60 mg by mouth daily     . gabapentin (NEURONTIN) 400 MG capsule Take 400 mg by mouth 3 times daily.     . pantoprazole (PROTONIX) 40 MG tablet Take 40 mg by mouth 2  times daily     . B Complex-C-Folic Acid (DIALYVITE TABLET) TABS Take 1 tablet by mouth daily     . tamsulosin (FLOMAX) 0.4 MG capsule Take 0.4 mg by mouth daily     . albuterol sulfate HFA 108 (90 Base) MCG/ACT inhaler Inhale 2 puffs into the lungs every 6 hours as needed for Wheezing     . tiZANidine (ZANAFLEX) 4  MG tablet Take 4 mg by mouth 2 times daily      . docusate sodium (COLACE) 100 MG capsule Take 1 capsule by mouth daily as needed      . lisinopril (PRINIVIL;ZESTRIL) 20 MG tablet Take 20 mg by mouth daily     . metFORMIN (GLUCOPHAGE) 1000 MG tablet Take 1,000 mg by mouth 2 times daily (with meals)     . hydrOXYzine (VISTARIL) 50 MG capsule Take 50 mg by mouth 3 times daily as needed for Itching     . atorvastatin (LIPITOR) 20 MG tablet Take 20 mg by mouth daily        No Known Allergies  Current Facility-Administered Medications   Medication Dose Route Frequency Provider Last Rate Last Dose   . ondansetron (ZOFRAN) injection 4 mg  4 mg Intravenous Q30 Min PRN Fayne Mediate, DO   4 mg at 06/10/18 1232   . morphine sulfate (PF) injection 4 mg  4 mg Intravenous Q30 Min PRN Fayne Mediate, DO   4 mg at 06/10/18 1232   . capsaicin (ZOSTRIX) 0.025 % cream   Topical BID Fayne Mediate, DO       . HYDROmorphone (DILAUDID) injection 1 mg  1 mg Intravenous Q30 Min PRN Mystic Labo, DO       . 0.9 % sodium chloride bolus  1,000 mL Intravenous Once Fayne Mediate, DO         Current Outpatient Medications   Medication Sig Dispense Refill   . HYDROcodone-acetaminophen (NORCO) 5-325 MG per tablet Take 1 tablet by mouth every 8 hours as needed for Pain for up to 3 days. 9 tablet 0   . ondansetron (ZOFRAN ODT) 4 MG disintegrating tablet Take 1 tablet by mouth every 8 hours as needed for Nausea 15 tablet 0   . dicyclomine (BENTYL) 10 MG capsule Take 1 capsule by mouth every 6 hours as needed (Bowel spasms) 20 capsule 0   . sucralfate (CARAFATE) 1 GM tablet Take 1 tablet by mouth 4 times daily for 15 days 60 tablet 0   . ketorolac (TORADOL) 10 MG tablet Take 1 tablet by mouth every 6 hours as needed for Pain (Patient not taking: Reported on 03/28/2018) 20 tablet 0   . aspirin 81 MG chewable tablet Take 1 tablet by mouth daily 90 tablet 3   . DULoxetine (CYMBALTA) 60 MG extended release capsule Take 60 mg by mouth daily     .  gabapentin (NEURONTIN) 400 MG capsule Take 400 mg by mouth 3 times daily.     . pantoprazole (PROTONIX) 40 MG tablet Take 40 mg by mouth 2 times daily     . B Complex-C-Folic Acid (DIALYVITE TABLET) TABS Take 1 tablet by mouth daily     . tamsulosin (FLOMAX) 0.4 MG capsule Take 0.4 mg by mouth daily     . albuterol sulfate HFA 108 (90 Base) MCG/ACT inhaler Inhale 2 puffs into the lungs every 6 hours as needed for Wheezing     . tiZANidine (ZANAFLEX) 4 MG tablet Take 4 mg by mouth 2 times daily      .  docusate sodium (COLACE) 100 MG capsule Take 1 capsule by mouth daily as needed      . lisinopril (PRINIVIL;ZESTRIL) 20 MG tablet Take 20 mg by mouth daily     . metFORMIN (GLUCOPHAGE) 1000 MG tablet Take 1,000 mg by mouth 2 times daily (with meals)     . hydrOXYzine (VISTARIL) 50 MG capsule Take 50 mg by mouth 3 times daily as needed for Itching     . atorvastatin (LIPITOR) 20 MG tablet Take 20 mg by mouth daily         Nursing Notes Reviewed    VITAL SIGNS:  ED Triage Vitals [06/10/18 1037]   Enc Vitals Group      BP (!) 196/118      Pulse 75      Resp 17      Temp 97.2 F (36.2 C)      Temp Source Oral      SpO2 98 %      Weight 164 lb (74.4 kg)      Height 5\' 6"  (1.676 m)      Head Circumference       Peak Flow       Pain Score       Pain Loc       Pain Edu?       Excl. in GC?        PHYSICAL EXAM:  Physical Exam  Vitals signs and nursing note reviewed.   Constitutional:       General: He is not in acute distress.     Appearance: Normal appearance. He is well-developed and well-groomed. He is not ill-appearing, toxic-appearing or diaphoretic.   HENT:      Head: Normocephalic and atraumatic.      Right Ear: External ear normal.      Left Ear: External ear normal.      Nose: No congestion or rhinorrhea.      Mouth/Throat:      Pharynx: No oropharyngeal exudate.   Eyes:      General: No scleral icterus.        Right eye: No discharge.         Left eye: No discharge.      Extraocular Movements: Extraocular movements  intact.      Conjunctiva/sclera: Conjunctivae normal.      Pupils: Pupils are equal, round, and reactive to light.   Neck:      Musculoskeletal: Full passive range of motion without pain and normal range of motion. Normal range of motion. No edema, erythema, neck rigidity, crepitus or injury.      Vascular: No JVD.      Trachea: Phonation normal.   Cardiovascular:      Rate and Rhythm: Normal rate and regular rhythm.      Pulses: Normal pulses.      Heart sounds: Normal heart sounds. No murmur. No friction rub. No gallop.    Pulmonary:      Effort: Pulmonary effort is normal. No respiratory distress.      Breath sounds: Normal breath sounds. No stridor. No wheezing, rhonchi or rales.   Abdominal:      General: Bowel sounds are normal. There is no distension.      Palpations: Abdomen is soft. There is no mass or pulsatile mass.      Tenderness: There is generalized abdominal tenderness. There is no guarding or rebound. Negative signs include Murphy's sign, Rovsing's sign and McBurney's sign.  Hernia: No hernia is present.   Musculoskeletal: Normal range of motion.         General: No swelling, tenderness, deformity or signs of injury.      Right lower leg: No edema.      Left lower leg: No edema.   Skin:     General: Skin is warm.      Coloration: Skin is not jaundiced or pale.      Findings: No bruising, erythema, lesion or rash.   Neurological:      General: No focal deficit present.      Mental Status: He is alert and oriented to person, place, and time.      GCS: GCS eye subscore is 4. GCS verbal subscore is 5. GCS motor subscore is 6.      Cranial Nerves: Cranial nerves are intact. No cranial nerve deficit, dysarthria or facial asymmetry.      Sensory: Sensation is intact. No sensory deficit.      Motor: Motor function is intact. No weakness, tremor, atrophy, abnormal muscle tone or seizure activity.      Coordination: Coordination is intact. Coordination normal.   Psychiatric:         Mood and Affect: Mood  normal.         Behavior: Behavior normal. Behavior is cooperative.         Thought Content: Thought content normal.         Judgment: Judgment normal.           I have reviewed andinterpreted all of the currently available lab results from this visit (if applicable):    Results for orders placed or performed during the hospital encounter of 06/10/18   CBC auto diff   Result Value Ref Range    WBC 17.9 (H) 4.0 - 10.5 K/CU MM    RBC 4.73 4.6 - 6.2 M/CU MM    Hemoglobin 12.6 (L) 13.5 - 18.0 GM/DL    Hematocrit 16.1 (L) 42 - 52 %    MCV 84.1 78 - 100 FL    MCH 26.6 (L) 27 - 31 PG    MCHC 31.7 (L) 32.0 - 36.0 %    RDW 18.5 (H) 11.7 - 14.9 %    Platelets 348 140 - 440 K/CU MM    MPV 9.5 7.5 - 11.1 FL    Bands Relative 2 (L) 5 - 11 %    Segs Relative 53.0 36 - 66 %    Lymphocytes % 40.0 24 - 44 %    Monocytes % 5.0 (H) 0 - 4 %    Bands Absolute 0.36 K/CU MM    Segs Absolute 9.4 K/CU MM    Lymphocytes Absolute 7.2 K/CU MM    Monocytes Absolute 0.9 K/CU MM    Differential Type MANUAL DIFFERENTIAL     Anisocytosis 1+     Elliptocytes 1+     Atypical Lymphocytes Absolute 1+    CMP   Result Value Ref Range    Sodium 137 135 - 145 MMOL/L    Potassium 3.9 3.5 - 5.1 MMOL/L    Chloride 101 99 - 110 mMol/L    CO2 23 21 - 32 MMOL/L    BUN 9 6 - 23 MG/DL    CREATININE 0.8 (L) 0.9 - 1.3 MG/DL    Glucose 096 (H) 70 - 99 MG/DL    Calcium 9.2 8.3 - 04.5 MG/DL    Alb 4.6 3.4 - 5.0 GM/DL  Total Protein 7.2 6.4 - 8.2 GM/DL    Total Bilirubin 0.3 0.0 - 1.0 MG/DL    ALT 16 10 - 40 U/L    AST 14 (L) 15 - 37 IU/L    Alkaline Phosphatase 66 40 - 129 IU/L    GFR Non-African American >60 >60 mL/min/1.53m2    GFR African American >60 >60 mL/min/1.73m2    Anion Gap 13 4 - 16   Lipase   Result Value Ref Range    Lipase 546 (H) 13 - 60 IU/L   Lactic Acid, Plasma   Result Value Ref Range    Lactate 2.0 0.4 - 2.0 mMOL/L   EKG 12 Lead   Result Value Ref Range    Ventricular Rate 67 BPM    Atrial Rate 67 BPM    P-R Interval 126 ms    QRS Duration 86 ms     Q-T Interval 398 ms    QTc Calculation (Bazett) 420 ms    P Axis 16 degrees    R Axis -15 degrees    T Axis 39 degrees    Diagnosis       Normal sinus rhythm  Normal ECG  When compared with ECG of 18-Mar-2018 21:18,  No significant change was found  Confirmed by [facility] MD, [facility] ([id number]) on 06/10/2018 12:33:37 PM          Radiographs (if obtained):  []  The following radiograph was interpreted by myself in the absence of a radiologist:  [x]  Radiologist's Report Reviewed:    CT Abd/pelv    Ct Abdomen Pelvis W Iv Contrast    Result Date: 06/07/2018  EXAMINATION: CT OF THE ABDOMEN AND PELVIS WITH CONTRAST 06/07/2018 1:07 pm TECHNIQUE: CT of the abdomen and pelvis was performed with the administration of intravenous contrast. Multiplanar reformatted images are provided for review. Dose modulation, iterative reconstruction, and/or weight based adjustment of the mA/kV was utilized to reduce the radiation dose to as low as reasonably achievable. COMPARISON: March 18, 2018 HISTORY: ORDERING SYSTEM PROVIDED HISTORY: abdominal pain TECHNOLOGIST PROVIDED HISTORY: IV contrast only. Thank you. Reason for exam:->abdominal pain Reason for exam:->IV contrast only. Thank you. Reason for Exam: abdominal pain Acuity: Acute Type of Exam: Initial Relevant Medical/Surgical History: 80 ML ISOVUEV 370 FINDINGS: Lower Chest: No focal consolidation or pleural effusion. Organs: The liver, gallbladder, spleen, pancreas, adrenal glands, and kidneys demonstrate no acute abnormality.  Bilateral ureters are normal in course and caliber.  No ureteral calculi. GI/Bowel: The stomach, small bowel, and colon are normal in course and caliber without evidence of wall thickening or obstruction.  Normal appendix. Pelvis: Normal bladder.  Prostate and seminal vesicles are unremarkable. Peritoneum/Retroperitoneum: No free fluid or free air.  No pathologic lymphadenopathy.  Aorta and its branches are patent and normal in course and caliber with moderate to  severe atherosclerosis. Bones/Soft Tissues: No acute or aggressive osseous lesion.     1. No acute intra-abdominal abnormality to account for the patient's symptoms. 2. Moderate to severe atherosclerosis.       EKG (if obtained): (All EKG's are interpreted by myself in the absence of a cardiologist)    12 lead EKG per my interpretation:  Normal Sinus Rhythm 67  Axis is   Normal  QTc is  420  There is no specific T wave changes appreciated.  There is no specific ST wave changes appreciated.    Prior EKG to compare with was not available     MDM:    Patient here  with abdominal pain continued and nausea.  Again he was here couple days ago for the same complaint had a negative work-up except for leukocytosis.  History of CML not on chemo, radiation.  On arrival he appears well he is in no distress vital signs are stable.  He is little hypertensive like a due to pain I will give him pain nausea medicine.  He has no hernia no pulsatile mass his abdomen is soft no guarding no rigidity.  Does have elevated white count today we will repeat scan.  He does smoke marijuana I did explain this could be the cause of his symptoms he denies that.  Patient is requesting pain medication to help the pain stop no history of AAA.  He otherwise appears well he states the pain is a 10 out of 10 but he is resting bed comfortably.  He has good pulses good sensation no weakness numbness tingling again he has no pulsatile mass no hernia.  Patient rechecked still having some pain imaging shows some material in his gastric lumen he has no active vomiting he denies any hematemesis I did not think it blood probably ingested material patient given pain medicine his lipase is elevated likely pancreatitis patient be admitted for such otherwise patient stable.  I do not think he has a AAA or ACS or perforation or active bleeding patient otherwise stable admitted.    CLINICAL IMPRESSION:  Final diagnoses:   Abdominal pain, unspecified abdominal  location   Nausea   History of leukemia   Leukocytosis, unspecified type   Acute pancreatitis, unspecified complication status, unspecified pancreatitis type       (Please note that portions of this note may have been completed with a voice recognition program. Efforts were made to edit the dictations but occasionally words aremis-transcribed.)    DISPOSITION REFERRAL (if applicable):  No follow-up provider specified.    DISPOSITION MEDICATIONS (if applicable):  New Prescriptions    No medications on file          Fayne Mediate, DO          Fayne Mediate, DO  06/10/18 1342

## 2018-06-10 NOTE — ED Notes (Signed)
Aware of need for urine sample. Pt in xray at this time.     Farrel Conners, RN  06/10/18 1251

## 2018-06-10 NOTE — ED Notes (Signed)
Hosp admit-orders     [employer]  06/10/18 1347

## 2018-06-11 LAB — CBC WITH AUTO DIFFERENTIAL
Bands Absolute: 0.58 10*3/uL
Bands Relative: 4 % — ABNORMAL LOW (ref 5–11)
Eosinophils %: 1 % (ref 0–3)
Eosinophils Absolute: 0.1 10*3/uL
Hematocrit: 34.8 % — ABNORMAL LOW (ref 42–52)
Hemoglobin: 10.9 GM/DL — ABNORMAL LOW (ref 13.5–18.0)
Lymphocytes %: 65 % — ABNORMAL HIGH (ref 24–44)
Lymphocytes Absolute: 9.5 10*3/uL
MCH: 26.6 PG — ABNORMAL LOW (ref 27–31)
MCHC: 31.3 % — ABNORMAL LOW (ref 32.0–36.0)
MCV: 84.9 FL (ref 78–100)
MPV: 9.6 FL (ref 7.5–11.1)
Monocytes %: 7 % — ABNORMAL HIGH (ref 0–4)
Monocytes Absolute: 1 10*3/uL
Platelets: 306 10*3/uL (ref 140–440)
RBC: 4.1 10*6/uL — ABNORMAL LOW (ref 4.6–6.2)
RDW: 18.3 % — ABNORMAL HIGH (ref 11.7–14.9)
Segs Absolute: 3.4 10*3/uL
Segs Relative: 23 % — ABNORMAL LOW (ref 36–66)
WBC: 14.6 10*3/uL — ABNORMAL HIGH (ref 4.0–10.5)

## 2018-06-11 LAB — COMPREHENSIVE METABOLIC PANEL W/ REFLEX TO MG FOR LOW K
ALT: 13 U/L (ref 10–40)
AST: 12 IU/L — ABNORMAL LOW (ref 15–37)
Albumin: 3.8 GM/DL (ref 3.4–5.0)
Alkaline Phosphatase: 51 IU/L (ref 40–128)
Anion Gap: 11 (ref 4–16)
BUN: 7 MG/DL (ref 6–23)
CO2: 24 MMOL/L (ref 21–32)
Calcium: 8.9 MG/DL (ref 8.3–10.6)
Chloride: 106 mMol/L (ref 99–110)
Creatinine: 0.9 MG/DL (ref 0.9–1.3)
GFR African American: >60 mL/min/{1.73_m2} (ref >60)
GFR Non-African American: >60 mL/min/{1.73_m2} (ref >60)
Glucose: 125 MG/DL — ABNORMAL HIGH (ref 70–99)
Potassium: 3.9 MMOL/L (ref 3.5–5.1)
Sodium: 141 MMOL/L (ref 135–145)
Total Bilirubin: 0.3 MG/DL (ref 0.0–1.0)
Total Protein: 5.5 GM/DL — ABNORMAL LOW (ref 6.4–8.2)

## 2018-06-11 LAB — POCT GLUCOSE
POC Glucose: 135 MG/DL — ABNORMAL HIGH (ref 70–99)
POC Glucose: 149 MG/DL — ABNORMAL HIGH (ref 70–99)
POC Glucose: 138 MG/DL — ABNORMAL HIGH (ref 70–99)
POC Glucose: 250 MG/DL — ABNORMAL HIGH (ref 70–99)
POC Glucose: 216 MG/DL — ABNORMAL HIGH (ref 70–99)

## 2018-06-11 LAB — LIPID PANEL
Cholesterol: 90 MG/DL (ref <200)
HDL: 35 MG/DL — ABNORMAL LOW (ref >40)
LDL Direct: 42 MG/DL (ref <100)
Triglycerides: 158 MG/DL — ABNORMAL HIGH (ref <150)

## 2018-06-11 LAB — HEMOGLOBIN A1C
Hemoglobin A1C: 6.8 % — ABNORMAL HIGH (ref 4.2–6.3)
eAG: 148 mg/dL

## 2018-06-11 LAB — LIPASE: Lipase: 40 IU/L (ref 13–60)

## 2018-06-11 MED ORDER — OXYCODONE-ACETAMINOPHEN 5-325 MG PO TABS
5-325 MG | ORAL | Status: DC | PRN
Start: 2018-06-11 — End: 2018-06-12
  Administered 2018-06-11 – 2018-06-12 (×4): 1 via ORAL

## 2018-06-11 MED ORDER — LUBIPROSTONE 8 MCG PO CAPS
8 MCG | Freq: Two times a day (BID) | ORAL | Status: DC
Start: 2018-06-11 — End: 2018-06-12
  Administered 2018-06-11 (×2): 16 ug via ORAL

## 2018-06-11 MED ORDER — CLOPIDOGREL BISULFATE 75 MG PO TABS
75 MG | Freq: Every day | ORAL | Status: DC
Start: 2018-06-11 — End: 2018-06-12
  Administered 2018-06-11 – 2018-06-12 (×2): 75 mg via ORAL

## 2018-06-11 MED ORDER — ATORVASTATIN CALCIUM 40 MG PO TABS
40 MG | Freq: Every evening | ORAL | Status: DC
Start: 2018-06-11 — End: 2018-06-12

## 2018-06-11 MED FILL — ASPIRIN 81 MG PO CHEW: 81 MG | ORAL | Qty: 1

## 2018-06-11 MED FILL — GABAPENTIN 400 MG PO CAPS: 400 MG | ORAL | Qty: 1

## 2018-06-11 MED FILL — DULOXETINE HCL 30 MG PO CPEP: 30 MG | ORAL | Qty: 2

## 2018-06-11 MED FILL — SUCRALFATE 1 G PO TABS: 1 GM | ORAL | Qty: 1

## 2018-06-11 MED FILL — LIPITOR 20 MG PO TABS: 20 MG | ORAL | Qty: 1

## 2018-06-11 MED FILL — TIZANIDINE HCL 4 MG PO TABS: 4 MG | ORAL | Qty: 1

## 2018-06-11 MED FILL — CLOPIDOGREL BISULFATE 75 MG PO TABS: 75 MG | ORAL | Qty: 1

## 2018-06-11 MED FILL — DICYCLOMINE HCL 10 MG PO CAPS: 10 MG | ORAL | Qty: 1

## 2018-06-11 MED FILL — LISINOPRIL 20 MG PO TABS: 20 MG | ORAL | Qty: 1

## 2018-06-11 MED FILL — PANTOPRAZOLE SODIUM 40 MG PO TBEC: 40 MG | ORAL | Qty: 1

## 2018-06-11 MED FILL — AMITIZA 8 MCG PO CAPS: 8 MCG | ORAL | Qty: 2

## 2018-06-11 MED FILL — OXYCODONE-ACETAMINOPHEN 5-325 MG PO TABS: 5-325 MG | ORAL | Qty: 1

## 2018-06-11 MED FILL — MIRTAZAPINE 15 MG PO TABS: 15 MG | ORAL | Qty: 2

## 2018-06-11 MED FILL — HYDROMORPHONE HCL 2 MG/ML IJ SOLN: 2 MG/ML | INTRAMUSCULAR | Qty: 1

## 2018-06-11 MED FILL — LOVENOX 40 MG/0.4ML SC SOLN: 40 MG/0.4ML | SUBCUTANEOUS | Qty: 0.4

## 2018-06-11 MED FILL — TAMSULOSIN HCL 0.4 MG PO CAPS: 0.4 MG | ORAL | Qty: 1

## 2018-06-11 MED FILL — THERA M PLUS PO TABS: ORAL | Qty: 1

## 2018-06-11 NOTE — Plan of Care (Signed)
Problem: Falls - Risk of:  Goal: Will remain free from falls  Description: Will remain free from falls  06/11/2018 0933 by Lilia Pro, LPN  Outcome: Ongoing  06/11/2018 0235 by Kandice Robinsons, RN  Outcome: Ongoing  Goal: Absence of physical injury  Description: Absence of physical injury  06/11/2018 0933 by Lilia Pro, LPN  Outcome: Ongoing  06/11/2018 0235 by Kandice Robinsons, RN  Outcome: Ongoing     Problem: Pain:  Goal: Pain level will decrease  Description: Pain level will decrease  06/11/2018 0933 by Lilia Pro, LPN  Outcome: Ongoing  06/11/2018 0235 by Kandice Robinsons, RN  Outcome: Ongoing  Goal: Control of acute pain  Description: Control of acute pain  06/11/2018 0933 by Lilia Pro, LPN  Outcome: Ongoing  06/11/2018 0235 by Kandice Robinsons, RN  Outcome: Ongoing  Goal: Control of chronic pain  Description: Control of chronic pain  06/11/2018 0933 by Lilia Pro, LPN  Outcome: Ongoing  06/11/2018 0235 by Kandice Robinsons, RN  Outcome: Ongoing

## 2018-06-11 NOTE — Consults (Signed)
[facility]  Gastroenterology Consultation    06/11/2018  8:02 AM    Patient:    Ricardo Foley  DOB: 18-Jun-1960   58 y.o.             MRN: [id number]  Admitted: 06/10/2018 11:25 AM ATT: Jerline Pain, MD   3106/3106-A  AdmitDx: Nausea [R11.0]  History of leukemia [Z85.6]  Abdominal pain, unspecified abdominal location [R10.9]  Leukocytosis, unspecified type [D72.829]  Pancreatitis, unspecified pancreatitis type [K85.90]  Acute pancreatitis, unspecified complication status, unspecified pancreatitis type [K85.90]  PCP: Kyra Manges ADAMS, APRN - CNP    Reason for Consult: abd pain     Requesting Physician:  Jerline Pain, MD      History Obtained From:  Patient and review of all records    HISTORY OF PRESENT ILLNESS:                The patient is a 58 y.o. male with significant past medical history as below who presents with above mentioned causes in reason for consult. Presented to [facility] fro abd pain. Started  Week ago, left side, dull constant. Made nauseated. Hot shower did not work, Has hx ibs-C. Taking Linzess. Helping with constipation and abd pain. Last drank years ago. Hx DM and leukemia.  Taking dialudid for pain.      Last BM yesterday     seen by Dr. Roosevelt Locks.? proctitis; c scope in September at [facility] "normal"     ASA 81 mg   Denies NSAIDs  Anti-platelet therapy Plavix     Smoker 1 1/2 ppd  Stopped Alcohol intake    Past Medical History:        Diagnosis Date   ??? CAD (coronary artery disease)    ??? Chronic back pain     Had shots August 2019   ??? Diabetes [facility])     Diagnosed about 2009   ??? H/O cardiac catheterization 08/02/2017     severe single vessel CAD of the distal LCX which is small caliber and not amenable to PCI.   ??? H/O cardiovascular stress test 07/03/2017    scan shows moderate size, severe intensity, non reversible perfusion defect in inferolateral wall, normal LVEF.   ??? H/O Doppler lower arterial ultrasound 07/11/2017    The Right Proximal  and mid SFA exhibits an occlusion. The Left SFA exhibits an occlusion between the mid and distal portions. Right ABIs show Severe peripheral arterial disease.   ??? H/O echocardiogram 05/30/2017    Left ventricular systolic function is normal. Ejection fraction is visually estimated at >55%.   ??? Hyperlipidemia    ??? Hypertension    ??? Nerve pain    ??? PVD (peripheral vascular disease) ([place]) 08/02/2017     Severe lower extremity PAD. See abn Peripherial Angio. ( ABN Arterial US.07/11/17)   ??? Scoliosis        Past Surgical History:        Procedure Laterality Date   ??? PR SONO GUIDE NEEDLE BIOPSY           Current Medications:    Medications    Scheduled Medications:   ??? capsaicin   Topical BID   ??? aspirin  81 mg Oral Daily   ??? atorvastatin  20 mg Oral Daily   ??? therapeutic multivitamin-minerals  1 tablet Oral Daily   ??? DULoxetine  60 mg Oral Daily   ??? gabapentin  400 mg Oral TID   ??? lisinopril  20 mg Oral Daily   ??? pantoprazole  40 mg Oral BID   ??? sucralfate  1 g Oral 4x Daily   ??? tamsulosin  0.4 mg Oral Daily   ??? tiZANidine  4 mg Oral BID   ??? sodium chloride flush  10 mL Intravenous 2 times per day   ??? enoxaparin  40 mg Subcutaneous Daily   ??? insulin lispro  0-6 Units Subcutaneous Q4H   ??? mirtazapine  30 mg Oral Nightly     PRN Medications: ipratropium-albuterol, dicyclomine, sodium chloride flush, acetaminophen **OR** acetaminophen, polyethylene glycol, promethazine **OR** ondansetron, potassium chloride, HYDROmorphone, glucose, dextrose, glucagon (rDNA), dextrose      Allergies:  Patient has no known allergies.    Social History:   TOBACCO:   reports that he has been smoking cigarettes. He has been smoking about 1.50 packs per day. He has never used smokeless tobacco.  ETOH:   reports no history of alcohol use.    Family History:       Problem Relation Age of Onset   ??? Stroke Sister    ??? Heart Attack Brother        No family history of colon cancer, Crohn's disease, or ulcerative colitis.    REVIEW OF SYSTEMS:    The  positive ROS will be identified in bold, otherwise ROS are negative     CONSTITUTIONAL:  Neg   Recent weight changes, fatigue, fever, chills or night sweats  MOUTH/THROAT:  Neg  bleeding gums, hoarseness or sore throat.  RESPIRATORY:   Neg SOB, wheeze, cough, sputum, hemoptysis or bronchitis  CARDIOVASCULAR:  Neg chest pain, palpitations, dyspnea on exertion, orthopnea, paroxysmal nocturnal dyspnea or edema  GASTROINTESTINAL:  SEE HPI  HEMATOLOGIC/LYMPHATIC:  Neg  Anemia, bleeding tendency  MUSCULOSKELETAL:    New myalgias, bone pain, joint pain, swelling or stiffness and has had change in gait.  NEUROLOGICAL:  Neg  Loss of Consciousness, memory loss, forgetfulness, periods of confusion, difficulty concentrating, seizures, decline in intellect, nervousness, insomina, aphasia or dysarthria.  SKIN:  Neg  skin or hair changes, and has no itching, rashes, or sores.  PSYCHIATRIC:  Neg depression, personality changes, anxiety.  ENDOCRINE:  Neg polydipsia, polyuria, abnormal weight changes, heat /cold intolerance.  ALL/IMM:  Neg reactions to drugs other than listed    PHYSICAL EXAM:      Vitals:    BP (!) 154/84    Pulse 59    Temp 97.9 ??F (36.6 ??C) (Oral)    Resp 9    Ht 5\' 6"  (1.676 m)    Wt 175 lb 11.3 oz (79.7 kg)    SpO2 96%    BMI 28.36 kg/m??     General Appearance:    Alert, cooperative, no distress, appears stated age   2:    Normocephalic, atraumatic, Conjunctiva clear, Lips, mucosa, and tongue normal; teeth and gums normal   Neck:   Supple, symmetrical, trachea midline   Lungs:     Clear to auscultation bilaterally, respirations unlabored   Chest Wall:    No tenderness or deformity    Heart:    Regular rate and rhythm, S1 and S2 normal, no murmur, rub   or gallop   Abdomen:     Soft, non-tender, bowel sounds active all four quadrants,     no masses, no organomegaly, no ascites    Rectal:    Deferred   Extremities:   Extremities normal, atraumatic, no cyanosis or edema   Pulses:   2+ and  symmetric all  extremities   Skin:   Skin color, texture, turgor normal, no rashes or lesions       Neurologic:   No focal deficits, moving all four extremities            DATA:    ABGs: No results found for: PHART, PO2ART, PCO2ART  CBC:   Recent Labs     06/10/18  1040 06/11/18  0219   WBC 17.9* 14.6*   HGB 12.6* 10.9*   PLT 348 306     BMP:    Recent Labs     06/10/18  1040 06/11/18  0219   NA 137 141   K 3.9 3.9   CL 101 106   CO2 23 24   BUN 9 7   CREATININE 0.8* 0.9   GLUCOSE 209* 125*     Magnesium:   Lab Results   Component Value Date    MG 1.7 09/09/2017     Hepatic:   Recent Labs     06/10/18  1040 06/11/18  0219   AST 14* 12*   ALT 16 13   BILITOT 0.3 0.3   ALKPHOS 66 51     Recent Labs     06/10/18  1040 06/11/18  0219   LIPASE 546* 40     No results for input(s): PROTIME, INR in the last 72 hours.  No results for input(s): PTT in the last 72 hours.  Lipids: No results for input(s): CHOL, HDL in the last 72 hours.    Invalid input(s): LDLCALCU  INR: No results for input(s): INR in the last 72 hours.  TSH:   Lab Results   Component Value Date    TSH 1.42 03/12/2017       Intake/Output Summary (Last 24 hours) at 06/11/2018 0802  Last data filed at 06/11/2018 0012  Gross per 24 hour   Intake --   Output 675 ml   Net -675 ml     ??? lactated ringers 125 mL/hr at 06/11/18 0049   ??? dextrose         Imaging Studies: Reviewed    Patient Active Problem List   Diagnosis Code   ??? Chest pain R07.9   ??? Lower abdominal pain R10.30   ??? Anemia D64.9   ??? Abnormal stress test R94.39   ??? PAD (peripheral artery disease) (HCC) I73.9   ??? AKI (acute kidney injury) ([place]) N17.9   ??? Leukocytosis - chronic - patient reports CLL D72.829   ??? Pancreatitis, unspecified pancreatitis type K85.90     Assessment:  Abd pain   CT 06/10/2018  Patchy layering hyperdensity in the gastric lumen could be seen with contrast   extravasation in the setting of active bleeding, but no definite gastric   abnormality is identified. ??However, the appearance could also be  related to   ingested material. ??Consider endoscopy     Lipase was 546 on admission, now 40  Still has pain   May be chronic IBS-C pain as well as resolving mild pancreatitis  No alcohol, a1c 6.8, triglycerides 158    RECOMMENDATIONS:  Treat symptomatically   Advance diet as tolerated  Pain medication PO as needed  Start Amitiza BID inpatient, resume Linzess outpatient   May consider started Creon in future     Discussed plan of care with patient    Patient clinical, biochemical, and radiological information discussed with Dr. Franchot Mimes.  He agrees with the assessment and plan.  Evon Slack, CNP  06/11/2018  8:02 AM    Mild pancreatitis  ? Viral  ? meds  ? Non specefic    I have seen and examined this patient personally, and independently of the nurse practitioner.    The plan was developed mutually at the time of the visit with the patient.  Evon Slack and myself have spoken with patient, nursing staff and provided written and verbal instructions .    The above note has been reviewed and I agree with the Assessment,  Diagnosis, and Treatment plan as suggested by Evon Slack CNP      [place] [facility]

## 2018-06-11 NOTE — Plan of Care (Signed)
Problem: Falls - Risk of:  Goal: Will remain free from falls  Outcome: Ongoing  Goal: Absence of physical injury  Outcome: Ongoing     Problem: Pain:  Goal: Pain level will decrease  Outcome: Ongoing  Goal: Control of acute pain  Outcome: Ongoing  Goal: Control of chronic pain  Outcome: Ongoing

## 2018-06-11 NOTE — Plan of Care (Signed)
Problem: Falls - Risk of:  Goal: Will remain free from falls  06/11/2018 2105 by Veatrice Kells, RN  Outcome: Ongoing  06/11/2018 0933 by Daryll Brod, LPN  Outcome: Ongoing  Goal: Absence of physical injury  06/11/2018 2105 by Veatrice Kells, RN  Outcome: Ongoing  06/11/2018 0933 by Daryll Brod, LPN  Outcome: Ongoing     Problem: Pain:  Goal: Pain level will decrease  06/11/2018 2105 by Veatrice Kells, RN  Outcome: Ongoing  06/11/2018 0933 by Daryll Brod, LPN  Outcome: Ongoing  Goal: Control of acute pain  06/11/2018 2105 by Veatrice Kells, RN  Outcome: Ongoing  06/11/2018 0933 by Daryll Brod, LPN  Outcome: Ongoing  Goal: Control of chronic pain  06/11/2018 2105 by Veatrice Kells, RN  Outcome: Ongoing  06/11/2018 0933 by Daryll Brod, LPN  Outcome: Ongoing

## 2018-06-11 NOTE — Progress Notes (Signed)
Hospitalist Progress Note      Name:  Ricardo Foley: Mar 15, 1960  (58 y.o. male)   MRN & CSN:  [id number] & [id number] Admission Date/Time: 06/10/2018 11:25 AM   Location:  3106/3106-A PCP: Kyra Manges ADAMS, [place] Hospital Day: 2    Assessment and Plan:   Ricardo Foley is a 58 y.o.  male  who presents with abdominal pain, nausea and vomiting  ??  Assessment and Plan    1) Acute on chronic pancreatitis, mild  -On admission, lipase 546  -Abdominal/pelvis CT showed ??Mild to moderate pancreatic atrophy with some fatty replacement  -Rehydrated with IVF NS  -GI on board  -Diet advanced  ??  2) IBS  -Resume meds  ??  3) Hx of CLL with Leukocytosis  -Patient encouraged to follow up with hem/onc as OP  ??  4) Type 2 DM  -Sliding scale insulin  -Hypoglycemia precautions  ??  5) Normocytic anemia, Anemia of chronic disorder  ??  6) Essential HTN; uncontrolled  -Will readjust meds    Diet DIET FULL LIQUID;   DVT Prophylaxis []  Lovenox, []   Heparin, []  SCDs, []  Ambulation   GI Prophylaxis []  PPI,  []  H2 Blocker,  []  Carafate,  []  Diet/Tube Feeds   Code Status Full Code   Disposition Home   MDM      History of Present Illness:     Patient was seen and examined  Denied any further N/V  Abdominal pain seems to be improving  No fever, chills    Ten point ROS reviewed negative, unless as noted above    Objective:       Intake/Output Summary (Last 24 hours) at 06/11/2018 1338  Last data filed at 06/11/2018 0012  Gross per 24 hour   Intake --   Output 675 ml   Net -675 ml      Vitals:   Vitals:    06/11/18 0758   BP:    Pulse:    Resp:    Temp:    SpO2: 96%     Physical Exam:   GEN Awake male, sitting upright in bed in no apparent distress. Appears given age.  EYES Pupils are equally round.  No scleral erythema, discharge, or conjunctivitis.  HENT Mucous membranes are moist. Oral pharynx without exudates, no evidence of thrush.  NECK Supple, no apparent thyromegaly or masses.  RESP Clear to auscultation, no  wheezes, rales or rhonchi.  Symmetric chest movement while on room air.  CARDIO/VASC S1/S2 auscultated. Regular rate without appreciable murmurs, rubs, or gallops. No JVD or carotid bruits. Peripheral pulses equal bilaterally and palpable. No peripheral edema.  GI Abdomen is soft without significant tenderness, masses, or guarding. Bowel sounds are normoactive. Rectal exam deferred.   GU No costovertebral angle tenderness. Normal appearing external genitalia. Foley catheter is not present.  HEME/LYMPH No palpable cervical lymphadenopathy and no hepatosplenomegaly. No petechiae or ecchymoses.  MSK No gross joint deformities.  SKIN Normal coloration, warm, dry.  NEURO Cranial nerves appear grossly intact, normal speech, no lateralizing weakness.  PSYCH Awake, alert, oriented x 4.  Affect appropriate.    Medications:   Medications:   ??? lubiprostone  16 mcg Oral BID WC   ??? [START ON 06/12/2018] atorvastatin  40 mg Oral Nightly   ??? clopidogrel  75 mg Oral Daily   ??? capsaicin   Topical BID   ??? aspirin  81 mg Oral  Daily   ??? therapeutic multivitamin-minerals  1 tablet Oral Daily   ??? DULoxetine  60 mg Oral Daily   ??? gabapentin  400 mg Oral TID   ??? lisinopril  20 mg Oral Daily   ??? pantoprazole  40 mg Oral BID   ??? sucralfate  1 g Oral 4x Daily   ??? tamsulosin  0.4 mg Oral Daily   ??? tiZANidine  4 mg Oral BID   ??? sodium chloride flush  10 mL Intravenous 2 times per day   ??? enoxaparin  40 mg Subcutaneous Daily   ??? insulin lispro  0-6 Units Subcutaneous Q4H   ??? mirtazapine  30 mg Oral Nightly      Infusions:   ??? lactated ringers 125 mL/hr at 06/11/18 2956   ??? dextrose       PRN Meds: oxyCODONE-acetaminophen, 1 tablet, Q4H PRN  ipratropium-albuterol, 1 ampule, Q4H PRN  dicyclomine, 10 mg, Q6H PRN  sodium chloride flush, 10 mL, PRN  acetaminophen, 650 mg, Q6H PRN    Or  acetaminophen, 650 mg, Q6H PRN  polyethylene glycol, 17 g, Daily PRN  promethazine, 12.5 mg, Q6H PRN    Or  ondansetron, 4 mg, Q6H PRN  potassium chloride, 10 mEq,  PRN  glucose, 15 g, PRN  dextrose, 12.5 g, PRN  glucagon (rDNA), 1 mg, PRN  dextrose, 100 mL/hr, PRN          Electronically signed by Jerline Pain, MD on 06/11/2018 at 1:38 PM

## 2018-06-11 NOTE — Progress Notes (Signed)
Perfect serve to [place] NP.  To asked if we could dc Q4 blood sugars as patient is no longer npo.

## 2018-06-11 NOTE — Care Coordination-Inpatient (Signed)
Met c pt to initiate discharge planning.  Pt lives at home, remains fully ind with ADLs, has pcp/ active insurance/ can afford meds and has reliable transportation.  Pt denied needs, advised CM available if needs arise.

## 2018-06-12 LAB — POCT GLUCOSE
POC Glucose: 176 MG/DL — ABNORMAL HIGH (ref 70–99)
POC Glucose: 147 MG/DL — ABNORMAL HIGH (ref 70–99)

## 2018-06-12 LAB — LIPASE: Lipase: 37 IU/L (ref 13–60)

## 2018-06-12 MED ORDER — HYDROCODONE-ACETAMINOPHEN 5-325 MG PO TABS
5-325 MG | ORAL_TABLET | Freq: Four times a day (QID) | ORAL | 0 refills | Status: AC | PRN
Start: 2018-06-12 — End: 2018-06-15

## 2018-06-12 MED ORDER — SENNOSIDES-DOCUSATE SODIUM 8.6-50 MG PO TABS
ORAL_TABLET | Freq: Every day | ORAL | 0 refills | Status: AC | PRN
Start: 2018-06-12 — End: 2018-06-22

## 2018-06-12 MED ORDER — INSULIN LISPRO 100 UNIT/ML SC SOLN
100 UNIT/ML | Freq: Four times a day (QID) | SUBCUTANEOUS | Status: DC
Start: 2018-06-12 — End: 2018-06-12

## 2018-06-12 MED ORDER — LUBIPROSTONE 24 MCG PO CAPS
24 MCG | Freq: Two times a day (BID) | ORAL | Status: DC
Start: 2018-06-12 — End: 2018-06-12
  Administered 2018-06-12: 13:00:00 24 ug via ORAL

## 2018-06-12 MED ORDER — LUBIPROSTONE 24 MCG PO CAPS
24 MCG | ORAL_CAPSULE | Freq: Two times a day (BID) | ORAL | 3 refills | Status: DC
Start: 2018-06-12 — End: 2018-11-07

## 2018-06-12 MED ORDER — MAGNESIUM CITRATE PO SOLN
Freq: Once | ORAL | Status: DC
Start: 2018-06-12 — End: 2018-06-12

## 2018-06-12 MED FILL — TIZANIDINE HCL 4 MG PO TABS: 4 MG | ORAL | Qty: 1

## 2018-06-12 MED FILL — PANTOPRAZOLE SODIUM 40 MG PO TBEC: 40 MG | ORAL | Qty: 1

## 2018-06-12 MED FILL — TAMSULOSIN HCL 0.4 MG PO CAPS: 0.4 MG | ORAL | Qty: 1

## 2018-06-12 MED FILL — LOVENOX 40 MG/0.4ML SC SOLN: 40 MG/0.4ML | SUBCUTANEOUS | Qty: 0.4

## 2018-06-12 MED FILL — THERA M PLUS PO TABS: ORAL | Qty: 1

## 2018-06-12 MED FILL — MAGNESIUM CITRATE 1.745 GM/30ML PO SOLN: 1.745 GM/30ML | ORAL | Qty: 296

## 2018-06-12 MED FILL — GABAPENTIN 400 MG PO CAPS: 400 MG | ORAL | Qty: 1

## 2018-06-12 MED FILL — OXYCODONE-ACETAMINOPHEN 5-325 MG PO TABS: 5-325 MG | ORAL | Qty: 1

## 2018-06-12 MED FILL — DICYCLOMINE HCL 10 MG PO CAPS: 10 MG | ORAL | Qty: 1

## 2018-06-12 MED FILL — AMITIZA 24 MCG PO CAPS: 24 MCG | ORAL | Qty: 1

## 2018-06-12 MED FILL — ASPIRIN 81 MG PO CHEW: 81 MG | ORAL | Qty: 1

## 2018-06-12 MED FILL — SUCRALFATE 1 G PO TABS: 1 GM | ORAL | Qty: 1

## 2018-06-12 MED FILL — DULOXETINE HCL 30 MG PO CPEP: 30 MG | ORAL | Qty: 2

## 2018-06-12 MED FILL — MIRTAZAPINE 15 MG PO TABS: 15 MG | ORAL | Qty: 2

## 2018-06-12 MED FILL — LISINOPRIL 20 MG PO TABS: 20 MG | ORAL | Qty: 1

## 2018-06-12 MED FILL — CLOPIDOGREL BISULFATE 75 MG PO TABS: 75 MG | ORAL | Qty: 1

## 2018-06-12 NOTE — Progress Notes (Signed)
[facility]        Progress Note       06/12/2018  7:35 AM    Patient:    Ricardo Foley  DOB: July 11, 1960   58 y.o.             MRN: [id number]  Admitted: 06/10/2018 11:25 AM ATT: Jerline Pain, MD   3106/3106-A  AdmitDx: Nausea [R11.0]  History of leukemia [Z85.6]  Abdominal pain, unspecified abdominal location [R10.9]  Leukocytosis, unspecified type [D72.829]  Pancreatitis, unspecified pancreatitis type [K85.90]  Acute pancreatitis, unspecified complication status, unspecified pancreatitis type [K85.90]  Acute pancreatitis, unspecified complication status, unspecified pancreatitis type [K85.90]  PCP: Kyra Manges ADAMS, APRN - CNP    SUBJECTIVE:  Chart reviewed, events noted  Patient feeling ok. Ate well yesterday. Still has abd pain, lower, diffuse, crampy. Minimal relief with Percocet. Requesting dilaudid. Has not tried Bentyl. No BM overnight.     ROS:  The positive ROS will be identified in bold     CONSTITUTIONAL:  Neg  Weight loss, fatigue, fever  MOUTH/THROAT:  Neg  Bleeding gums, hoarseness or sore throat  RESPIRATORY:   Neg SOB, wheeze, cough, hemoptysis or bronchitis  CARDIOVASCULAR:  Neg Chest pain, palpitations, dyspnea on exertion, edema  GASTROINTESTINAL:  SEE HPI  HEMATOLOGIC/LYMPHATIC:  Neg  Anemia, bleeding tendency  MUSCULOSKELETAL: Neg  New myalgias, joint pain, swelling or stiffness  NEUROLOGICAL:  Neg  Loss of Consciousness, memory loss, forgetfulness, periods of confusion, difficulty concentrating, seizures, insomnia, aphasia    SKIN:  Neg No itching, rashes, or sores  PSYCHIATRIC:  Neg Depression, personality changes, anxiety    OBJECTIVE:      BP (!) 172/92    Pulse 63    Temp 97.9 ??F (36.6 ??C) (Oral)    Resp 10    Ht 5\' 6"  (1.676 m)    Wt 179 lb 10.8 oz (81.5 kg)    SpO2 94%    BMI 29.00 kg/m??     NAD, appears comfortable  Lips and mucous membranes pink and moist  RRR, Nl s1s2  Lungs CTA bilaterally, respirations even  and unlabored   Abdomen soft, ND, Lower abd tenderness, bowel sounds normal  Skin pink, warm and dry  No edema bilateral lower extremities   AAOx3  CBC:   Recent Labs     06/10/18  1040 06/11/18  0219   WBC 17.9* 14.6*   HGB 12.6* 10.9*   PLT 348 306     BMP:    Recent Labs     06/10/18  1040 06/11/18  0219   NA 137 141   K 3.9 3.9   CL 101 106   CO2 23 24   BUN 9 7   CREATININE 0.8* 0.9   GLUCOSE 209* 125*     Magnesium:   Lab Results   Component Value Date    MG 1.7 09/09/2017     Hepatic:   Recent Labs     06/10/18  1040 06/11/18  0219   AST 14* 12*   ALT 16 13   BILITOT 0.3 0.3   ALKPHOS 66 51     INR: No results for input(s): INR in the last 72 hours.      Intake/Output Summary (Last 24 hours) at 06/12/2018 0735  Last data filed at 06/12/2018 0630  Gross per 24 hour   Intake 700 ml   Output 2475 ml   Net -1775 ml  Medications    Scheduled Medications:   ??? magnesium citrate  296 mL Oral Once   ??? lubiprostone  24 mcg Oral BID WC   ??? atorvastatin  40 mg Oral Nightly   ??? clopidogrel  75 mg Oral Daily   ??? insulin lispro  0-6 Units Subcutaneous 4x Daily AC & HS   ??? capsaicin   Topical BID   ??? aspirin  81 mg Oral Daily   ??? therapeutic multivitamin-minerals  1 tablet Oral Daily   ??? DULoxetine  60 mg Oral Daily   ??? gabapentin  400 mg Oral TID   ??? lisinopril  20 mg Oral Daily   ??? pantoprazole  40 mg Oral BID   ??? sucralfate  1 g Oral 4x Daily   ??? tamsulosin  0.4 mg Oral Daily   ??? tiZANidine  4 mg Oral BID   ??? sodium chloride flush  10 mL Intravenous 2 times per day   ??? enoxaparin  40 mg Subcutaneous Daily   ??? mirtazapine  30 mg Oral Nightly     PRN Medications: oxyCODONE-acetaminophen, ipratropium-albuterol, dicyclomine, sodium chloride flush, acetaminophen **OR** acetaminophen, polyethylene glycol, promethazine **OR** ondansetron, potassium chloride, glucose, dextrose, glucagon (rDNA), dextrose  Infusions:   ??? dextrose             Patient Active Problem List   Diagnosis Code   ??? Chest pain R07.9   ??? Lower abdominal  pain R10.30   ??? Anemia D64.9   ??? Abnormal stress test R94.39   ??? PAD (peripheral artery disease) (HCC) I73.9   ??? AKI (acute kidney injury) ([place]) N17.9   ??? Leukocytosis - chronic - patient reports CLL D72.829   ??? Pancreatitis, unspecified pancreatitis type K85.90   ??? Acute pancreatitis K85.90       ??  Assessment:  Abd pain   CT 06/10/2018  Patchy layering hyperdensity in the gastric lumen could be seen with contrast   extravasation in the setting of active bleeding, but no definite gastric   abnormality is identified. ??However, the appearance could also be related to   ingested material. ??Consider endoscopy   ??  Lipase was 546 on admission, now 37  Still has Lower abd pain and lipase normal, most likely IBS and NOT pancreatitis   No alcohol, a1c 6.8, triglycerides 158  ??  RECOMMENDATIONS:  Treat symptomatically   Advance diet as tolerated  Mag Citrate today  Pain medication PO as needed  Continue Bentyl PRN   Increase Amitiza BID inpatient, resume Linzess outpatient \\  Ambulate     Stable from GI standpoint.Will sign off FU OP.     Discussed plan of care with patient     Patient clinical, biochemical, and radiological information discussed with Dr. Franchot Mimes.  He agrees with the assessment and plan.     Evon Slack, CNP  06/12/2018  7:35 AM     abd pain   Non specific  Treat symptomatically  Advance diet    I have seen and examined this patient personally, and independently of the nurse practitioner.    The plan was developed mutually at the time of the visit with the patient.  Evon Slack and myself have spoken with patient, nursing staff and provided written and verbal instructions .    The above note has been reviewed and I agree with the Assessment,  Diagnosis, and Treatment plan as suggested by Evon Slack CNP      [place] [facility]

## 2018-06-12 NOTE — Discharge Summary (Signed)
Discharge Summary    Name:  Ricardo Foley DOB/Age/Sex: 03-13-1960  (58 y.o. male)   MRN & CSN:  [id number] & [id number] Admission Date/Time: 06/10/2018 11:25 AM   Attending:  Jerline Pain, MD Discharging Physician: Jerline Pain, MD     Hospital Course:   Ricardo Foley is a 58 y.o.  male  who presents with abdominal pain    Patient was seen and examined  Denied any worsening abdominal pain  No N/V/D  No fever, chills  Tolerating PO    Assessment and Plan  1) Acute??on chronic??pancreatitis,??mild  -On admission, lipase??546 but currently normal  -Abdominal/pelvis CT showed????Mild to moderate pancreatic atrophy with some fatty replacement  -Rehydrated with IVF NS  -GI on board; OP follow up given  -Patient advised to follow up with pain management  ??  2) IBS  -Resume meds  ??  3) Hx of CLL with Leukocytosis  -Patient encouraged to follow up with hem/onc as OP  ??  4) Type 2 DM  -Sliding scale insulin  -Hypoglycemia precautions  ??  5) Normocytic anemia, Anemia of chronic disorder  ??  6) Essential HTN; uncontrolled  -Will readjust meds      The patient expressed appropriate understanding of and agreement with the discharge recommendations, medications, and plan.     Consults this admission:  IP CONSULT TO HOSPITALIST  IP CONSULT TO GI    Discharge Instruction:   Follow up appointments: GI in 2 weeks  Primary care physician:  within 2 weeks    Diet:  diabetic diet   Activity: activity as tolerated  Disposition: Discharged to:   [x] Home, [] HHC, [] SNF, [] Acute Rehab, [] Hospice   Condition on discharge: Stable    Discharge Medications:      Ricardo Foley, Ricardo Foley   Home Medication Instructions [id number]    Printed on:06/12/18 6962   Medication Information                      albuterol sulfate HFA 108 (90 Base) MCG/ACT inhaler  Inhale 2 puffs into the lungs every 6 hours as needed for Wheezing             aspirin 81 MG chewable tablet  Take 1 tablet by mouth daily             atorvastatin (LIPITOR) 20 MG  tablet  Take 20 mg by mouth daily             B Complex-C-Folic Acid (DIALYVITE TABLET) TABS  Take 1 tablet by mouth daily             clopidogrel (PLAVIX) 75 MG tablet  Take 75 mg by mouth daily             dicyclomine (BENTYL) 10 MG capsule  Take 1 capsule by mouth every 6 hours as needed (Bowel spasms)             DULoxetine (CYMBALTA) 60 MG extended release capsule  Take 60 mg by mouth daily             gabapentin (NEURONTIN) 400 MG capsule  Take 400 mg by mouth 3 times daily.             HYDROcodone-acetaminophen (NORCO) 5-325 MG per tablet  Take 1 tablet by mouth every 6 hours as needed for Pain for up to 3 days.             hydrOXYzine (VISTARIL) 50 MG capsule  Take 50 mg by mouth 3 times daily as needed for Itching             ketorolac (TORADOL) 10 MG tablet  Take 1 tablet by mouth every 6 hours as needed for Pain             lisinopril (PRINIVIL;ZESTRIL) 20 MG tablet  Take 20 mg by mouth daily             lubiprostone (AMITIZA) 24 MCG capsule  Take 1 capsule by mouth 2 times daily (with meals)             metFORMIN (GLUCOPHAGE) 1000 MG tablet  Take 1,000 mg by mouth 2 times daily (with meals)             ondansetron (ZOFRAN ODT) 4 MG disintegrating tablet  Take 1 tablet by mouth every 8 hours as needed for Nausea             pantoprazole (PROTONIX) 40 MG tablet  Take 40 mg by mouth 2 times daily             senna-docusate (PERICOLACE) 8.6-50 MG per tablet  Take 2 tablets by mouth daily as needed for Constipation             sucralfate (CARAFATE) 1 GM tablet  Take 1 tablet by mouth 4 times daily for 15 days             tamsulosin (FLOMAX) 0.4 MG capsule  Take 0.4 mg by mouth daily             tiZANidine (ZANAFLEX) 4 MG tablet  Take 4 mg by mouth 2 times daily                  Objective Findings at Discharge:   BP (!) 172/92    Pulse 63    Temp 97.9 ??F (36.6 ??C) (Oral)    Resp 10    Ht 5\' 6"  (1.676 m)    Wt 179 lb 10.8 oz (81.5 kg)    SpO2 94%    BMI 29.00 kg/m??            PHYSICAL EXAM   GEN Awake male,  sitting upright in bed in no apparent distress. Appears given age.  EYES Pupils are equally round.  No scleral erythema, discharge, or conjunctivitis.  HENT Mucous membranes are moist. Oral pharynx without exudates, no evidence of thrush.  NECK Supple, no apparent thyromegaly or masses.  RESP Clear to auscultation, no wheezes, rales or rhonchi.  Symmetric chest movement while on room air.  CARDIO/VASC S1/S2 auscultated. Regular rate without appreciable murmurs, rubs, or gallops. No JVD or carotid bruits. Peripheral pulses equal bilaterally and palpable. No peripheral edema.  GI Abdomen is soft without significant tenderness, masses, or guarding. Bowel sounds are normoactive. Rectal exam deferred.   GU No costovertebral angle tenderness. Normal appearing external genitalia. Foley catheter is not present.  HEME/LYMPH No palpable cervical lymphadenopathy and no hepatosplenomegaly. No petechiae or ecchymoses.  MSK No gross joint deformities.  SKIN Normal coloration, warm, dry.  NEURO Cranial nerves appear grossly intact, normal speech, no lateralizing weakness.  PSYCH Awake, alert, oriented x 4.  Affect appropriate.    BMP/CBC  Recent Labs     06/10/18  1040 06/11/18  0219   NA 137 141   K 3.9 3.9   CL 101 106   CO2 23 24   BUN 9 7   CREATININE 0.8* 0.9  WBC 17.9* 14.6*   HCT 39.8* 34.8*   PLT 348 306       IMAGING:  As above    Discharge Time of 35 minutes    Electronically signed by Jerline Pain, MD on 06/12/2018 at 8:22 AM

## 2018-06-12 NOTE — Progress Notes (Signed)
Discharge instructions reviewed with pt. One Prescription sent with pt. All questions answered.

## 2018-06-13 LAB — EKG 12-LEAD
Atrial Rate: 67 {beats}/min
Diagnosis: NORMAL
P Axis: 16 degrees
P-R Interval: 126 ms
Q-T Interval: 398 ms
QRS Duration: 86 ms
QTc Calculation (Bazett): 420 ms
R Axis: -15 degrees
T Axis: 39 degrees
Ventricular Rate: 67 {beats}/min

## 2018-07-05 ENCOUNTER — Ambulatory Visit: Payer: PRIVATE HEALTH INSURANCE | Primary: Family

## 2018-07-05 ENCOUNTER — Encounter: Attending: Internal Medicine | Primary: Family

## 2018-08-22 ENCOUNTER — Ambulatory Visit: Payer: PRIVATE HEALTH INSURANCE | Primary: Family

## 2018-08-22 ENCOUNTER — Encounter: Attending: Internal Medicine | Primary: Family

## 2018-08-31 ENCOUNTER — Other Ambulatory Visit: Payer: Self-pay

## 2018-08-31 ENCOUNTER — Emergency Department (HOSPITAL_BASED_OUTPATIENT_CLINIC_OR_DEPARTMENT_OTHER)
Admission: EM | Admit: 2018-08-31 | Discharge: 2018-08-31 | Disposition: A | Discharge status: Home / Self Care | Payer: Medicaid Other | Attending: Emergency Medicine | Admitting: Emergency Medicine

## 2018-08-31 ENCOUNTER — Emergency Department (HOSPITAL_BASED_OUTPATIENT_CLINIC_OR_DEPARTMENT_OTHER): Payer: Medicaid Other

## 2018-08-31 ENCOUNTER — Encounter (HOSPITAL_BASED_OUTPATIENT_CLINIC_OR_DEPARTMENT_OTHER): Payer: Self-pay | Admitting: Emergency Medicine

## 2018-08-31 DIAGNOSIS — R112 Nausea with vomiting, unspecified: Secondary | ICD-10-CM | POA: Diagnosis not present

## 2018-08-31 DIAGNOSIS — I10 Essential (primary) hypertension: Secondary | ICD-10-CM | POA: Diagnosis not present

## 2018-08-31 DIAGNOSIS — Z79899 Other long term (current) drug therapy: Secondary | ICD-10-CM | POA: Insufficient documentation

## 2018-08-31 DIAGNOSIS — I2510 Atherosclerotic heart disease of native coronary artery without angina pectoris: Secondary | ICD-10-CM | POA: Insufficient documentation

## 2018-08-31 DIAGNOSIS — R1032 Left lower quadrant pain: Secondary | ICD-10-CM | POA: Insufficient documentation

## 2018-08-31 DIAGNOSIS — F17210 Nicotine dependence, cigarettes, uncomplicated: Secondary | ICD-10-CM | POA: Insufficient documentation

## 2018-08-31 DIAGNOSIS — E119 Type 2 diabetes mellitus without complications: Secondary | ICD-10-CM | POA: Insufficient documentation

## 2018-08-31 LAB — LIPASE, BLOOD: Lipase: 25 U/L (ref 11–51)

## 2018-08-31 LAB — COMPREHENSIVE METABOLIC PANEL
ALT: 15 U/L (ref 0–44)
AST: 14 U/L — ABNORMAL LOW (ref 15–41)
Albumin: 4.1 g/dL (ref 3.5–5.0)
Alkaline Phosphatase: 58 U/L (ref 38–126)
Anion gap: 11 (ref 5–15)
BUN: 8 mg/dL (ref 6–20)
CO2: 23 mmol/L (ref 22–32)
Calcium: 10 mg/dL (ref 8.9–10.3)
Chloride: 108 mmol/L (ref 98–111)
Creatinine, Ser: 1.11 mg/dL (ref 0.61–1.24)
GFR calc Af Amer: >60 mL/min (ref >60)
GFR calc non Af Amer: >60 mL/min (ref >60)
Glucose, Bld: 160 mg/dL — ABNORMAL HIGH (ref 70–99)
Potassium: 4.1 mmol/L (ref 3.5–5.1)
Sodium: 142 mmol/L (ref 135–145)
Total Bilirubin: 0.6 mg/dL (ref 0.3–1.2)
Total Protein: 6.8 g/dL (ref 6.5–8.1)

## 2018-08-31 LAB — URINALYSIS, ROUTINE W REFLEX MICROSCOPIC
Bilirubin Urine: NEGATIVE
Glucose, UA: NEGATIVE mg/dL
Hgb urine dipstick: NEGATIVE
Ketones, ur: NEGATIVE mg/dL
Leukocytes,Ua: NEGATIVE
Nitrite: NEGATIVE
Protein, ur: NEGATIVE mg/dL
Specific Gravity, Urine: <1.005 — ABNORMAL LOW (ref 1.005–1.030)
pH: 7 (ref 5.0–8.0)

## 2018-08-31 LAB — CBC
HCT: 38.3 % — ABNORMAL LOW (ref 39.0–52.0)
Hemoglobin: 12.2 g/dL — ABNORMAL LOW (ref 13.0–17.0)
MCH: 27.4 pg (ref 26.0–34.0)
MCHC: 31.9 g/dL (ref 30.0–36.0)
MCV: 86.1 fL (ref 80.0–100.0)
Platelets: 398 10*3/uL (ref 150–400)
RBC: 4.45 MIL/uL (ref 4.22–5.81)
RDW: 19.6 % — ABNORMAL HIGH (ref 11.5–15.5)
WBC: 15.8 10*3/uL — ABNORMAL HIGH (ref 4.0–10.5)
nRBC: 0 % (ref 0.0–0.2)

## 2018-08-31 MED ORDER — DICYCLOMINE HCL 20 MG PO TABS: 20.0000 mg | ORAL_TABLET | Freq: Two times a day (BID) | ORAL | 0 refills | Status: AC

## 2018-08-31 MED ORDER — IOHEXOL 300 MG/ML  SOLN
100.0000 mL | Freq: Once | INTRAMUSCULAR | Status: AC | PRN
  Administered 2018-08-31: 100 mL via INTRAVENOUS

## 2018-08-31 MED ORDER — HYDROMORPHONE HCL 1 MG/ML IJ SOLN
1.0000 mg | Freq: Once | INTRAMUSCULAR | Status: AC
  Administered 2018-08-31: 19:00:00 1 mg via INTRAVENOUS
  Filled 2018-08-31: qty 1

## 2018-08-31 MED ORDER — SODIUM CHLORIDE 0.9 % IV BOLUS
1000.0000 mL | Freq: Once | INTRAVENOUS | Status: AC
  Administered 2018-08-31: 19:00:00 1000 mL via INTRAVENOUS

## 2018-08-31 MED ORDER — HYDROMORPHONE HCL 1 MG/ML IJ SOLN
1.0000 mg | Freq: Once | INTRAMUSCULAR | Status: AC
  Administered 2018-08-31: 1 mg via INTRAVENOUS
  Filled 2018-08-31: qty 1

## 2018-08-31 MED ORDER — ONDANSETRON HCL 4 MG/2ML IJ SOLN
4.0000 mg | Freq: Once | INTRAMUSCULAR | Status: AC | PRN
  Administered 2018-08-31: 4 mg via INTRAVENOUS
  Filled 2018-08-31: qty 2

## 2018-08-31 NOTE — ED Notes (Signed)
Attempted IV access x 1 without success, but was able to obtain labs.

## 2018-08-31 NOTE — ED Triage Notes (Signed)
Pt here with abdominal pain x 3 days with N/V.

## 2018-08-31 NOTE — Discharge Instructions (Addendum)
You were seen in the emergency department for some left-sided abdominal pain.  You had blood work urinalysis and a CAT scan that did not show any obvious explanation for your symptoms.  There is no evidence of pancreatitis or diverticulitis.  We are prescribing you some dicyclomine to help with the abdominal spasm.  Will be important for you to follow-up with your primary care doctor for further evaluation.  Please return if any worsening symptoms.

## 2018-08-31 NOTE — ED Provider Notes (Signed)
[facility] EMERGENCY DEPARTMENT Provider Note   CSN: [id number] Arrival date & time: 08/31/18  1726     History   Chief Complaint Chief Complaint  Patient presents with   Abdominal Pain    HPI David Nelson is a 58 y.o. male.  He is complaining of severe abdominal pain in the lower abdomen more left than right that is been going on for 3 days.  Is been associated with nausea and vomiting.  He said he has had loose stools but he takes lenses and they have not been bloody.  No blood in the vomitus.  He states he has had this pain before and it usually been attributed to pancreatitis, diverticulitis, and his IBS.  He says they all feel about the same and he is not sure which one it is.  No fevers or chills no cough no chest pain or shortness of breath.  No urinary symptoms.     The history is provided by the patient.  Abdominal Pain Pain location:  LLQ, RLQ and suprapubic Pain quality: stabbing   Pain radiates to:  Does not radiate Pain severity:  Severe Onset quality:  Gradual Timing:  Constant Progression:  Unchanged Chronicity:  Recurrent Context: not sick contacts and not trauma   Relieved by:  Nothing Worsened by:  Nothing Ineffective treatments:  None tried Associated symptoms: nausea and vomiting   Associated symptoms: no chest pain, no chills, no cough, no dysuria, no fever, no hematemesis, no hematochezia, no hematuria, no melena, no shortness of breath and no sore throat     Past Medical History:  Diagnosis Date   CAD (coronary artery disease)    pt says he has been to ER for CP and it runs in family therefore he has CAD   Chronic abdominal pain    Chronic back pain    Depression    Diabetes mellitus without complication (HCC)    GERD (gastroesophageal reflux disease)    Hypertension    IBS (irritable bowel syndrome)    PAD (peripheral artery disease) (HCC)    Pancreatitis    Prostatitis    PVD (peripheral vascular disease) ([place])      Patient Active Problem List   Diagnosis Date Noted   Diabetes mellitus without complication (HCC)    IBS (irritable bowel syndrome)    Hypertension    GERD (gastroesophageal reflux disease)    Pancreatitis     No past surgical history on file.      Home Medications    Prior to Admission medications   Medication Sig Start Date End Date Taking? Authorizing Provider  atorvastatin (LIPITOR) 20 MG tablet TAKE 1 Tablet BY MOUTH EVERY NIGHT AT BEDTIME (REPLACES CRESTOR) 03/01/16   [provider]  B Complex-C-Folic Acid (STRESS B COMPLEX PO) Take by mouth.    [provider]  DULoxetine (CYMBALTA) 30 MG capsule Take 30 mg by mouth 2 (two) times daily.    [provider]  gabapentin (NEURONTIN) 300 MG capsule Take 1 capsule (300 mg total) by mouth 2 (two) times daily. 06/06/16   Soyla Dryer, PA-C  ibuprofen (ADVIL,MOTRIN) 600 MG tablet Take 600 mg by mouth every 8 (eight) hours as needed.    [provider]  lisinopril (PRINIVIL,ZESTRIL) 10 MG tablet Take 10 mg by mouth daily.    [provider]  metFORMIN (GLUCOPHAGE) 500 MG tablet Take 500 mg by mouth 2 (two) times daily. 03/07/16   [provider]  Multiple Vitamins-Minerals (MULTI FOR  HIM 50+ PO) Take by mouth.    [provider]  omeprazole (PRILOSEC) 40 MG capsule Take 40 mg by mouth daily.    [provider]  tamsulosin (FLOMAX) 0.4 MG CAPS capsule Take 0.4 mg by mouth every evening.  01/12/16   [provider]    Family History Family History  Problem Relation Age of Onset   Diabetes Mother    Heart disease Mother    Heart disease Father     Social History Social History   Tobacco Use   Smoking status: Current Every Day Smoker    Packs/day: 1.00    Years: 40.00    Pack years: 40.00    Types: Cigarettes   Smokeless tobacco: Never Used  Substance Use Topics   Alcohol use: No    Comment: alcoholic- dry 2013   Drug use:  Yes    Types: Marijuana    Comment: daily     Allergies   Patient has no active allergies.   Review of Systems Review of Systems  Constitutional: Negative for chills and fever.  HENT: Negative for sore throat.   Eyes: Negative for visual disturbance.  Respiratory: Negative for cough and shortness of breath.   Cardiovascular: Negative for chest pain.  Gastrointestinal: Positive for abdominal pain, nausea and vomiting. Negative for hematemesis, hematochezia and melena.  Genitourinary: Negative for dysuria and hematuria.  Musculoskeletal: Negative for neck pain.  Skin: Negative for rash.  Neurological: Negative for headaches.     Physical Exam Updated Vital Signs BP (!) 183/85 (BP Location: Right Arm)    Pulse 70    Temp 98.5 F (36.9 C) (Oral)    Resp 18    SpO2 100%   Physical Exam Vitals signs and nursing note reviewed.  Constitutional:      Appearance: He is well-developed.  HENT:     Head: Normocephalic and atraumatic.  Eyes:     Conjunctiva/sclera: Conjunctivae normal.  Neck:     Musculoskeletal: Neck supple.  Cardiovascular:     Rate and Rhythm: Normal rate and regular rhythm.     Heart sounds: No murmur.  Pulmonary:     Effort: Pulmonary effort is normal. No respiratory distress.     Breath sounds: Normal breath sounds.  Abdominal:     Palpations: Abdomen is soft.     Tenderness: There is abdominal tenderness in the left lower quadrant. There is no right CVA tenderness, left CVA tenderness, guarding or rebound.  Musculoskeletal:     Right lower leg: No edema.     Left lower leg: No edema.  Skin:    General: Skin is warm and dry.     Capillary Refill: Capillary refill takes less than 2 seconds.  Neurological:     General: No focal deficit present.     Mental Status: He is alert and oriented to person, place, and time.      ED Treatments / Results  Labs (all labs ordered are listed, but only abnormal results are displayed) Labs Reviewed   COMPREHENSIVE METABOLIC PANEL - Abnormal; Notable for the following components:      Result Value   Glucose, Bld 160 (*)    AST 14 (*)    All other components within normal limits  CBC - Abnormal; Notable for the following components:   WBC 15.8 (*)    Hemoglobin 12.2 (*)    HCT 38.3 (*)    RDW 19.6 (*)    All other components within normal limits  URINALYSIS, ROUTINE W REFLEX MICROSCOPIC - Abnormal; Notable for the following components:   Specific Gravity, Urine <1.005 (*)    All other components within normal limits  LIPASE, BLOOD    EKG None  Radiology Ct Abdomen Pelvis W Contrast  Result Date: 08/31/2018 CLINICAL DATA:  Abdominal pain with diverticulitis suspected. Left mid abdominal pain x3 days with diarrhea and nausea. EXAM: CT ABDOMEN AND PELVIS WITH CONTRAST TECHNIQUE: Multidetector CT imaging of the abdomen and pelvis was performed using the standard protocol following bolus administration of intravenous contrast. CONTRAST:  100mL OMNIPAQUE IOHEXOL 300 MG/ML  SOLN COMPARISON:  February 07, 2016. FINDINGS: Lower chest: There is a cluster of nodules at the left lung base which are new from prior study and measure up to approximately 6 mm (axial series 2, image 4). There is scarring versus atelectasis in the right middle lobe.The heart size is normal. Hepatobiliary: The liver is normal. Normal gallbladder.There is no biliary ductal dilation. Pancreas: Normal contours without ductal dilatation. No peripancreatic fluid collection. Spleen: No splenic laceration or hematoma. Adrenals/Urinary Tract: --Adrenal glands: No adrenal hemorrhage. --Right kidney/ureter: No hydronephrosis or perinephric hematoma. --Left kidney/ureter: No hydronephrosis or perinephric hematoma. --Urinary bladder: There is some mild bladder wall thickening which may be secondary to underdistention. Stomach/Bowel: --Stomach/Duodenum: No hiatal hernia or other gastric abnormality. Normal duodenal course and caliber.  --Small bowel: No dilatation or inflammation. --Colon: There is scattered colonic diverticula without CT evidence of diverticulitis. --Appendix: Normal. Vascular/Lymphatic: There are diffuse atherosclerotic changes of the abdominal aorta and its branch vessels. There is likely a high-grade stenosis involving the right common iliac and right external iliac arteries. --No retroperitoneal lymphadenopathy. --No mesenteric lymphadenopathy. --No pelvic or inguinal lymphadenopathy. Reproductive: Unremarkable Other: No ascites or free air. The abdominal wall is normal. Musculoskeletal. No acute displaced fractures. IMPRESSION: 1. No acute intra-abdominal abnormality detected. 2. There is a cluster of pulmonary nodules at the left lung base favored to be post infectious in etiology. A three-month follow-up CT is recommended to confirm resolution of these nodules. 3. Diverticulosis without CT evidence for diverticulitis. 4. Mild bladder wall thickening which is favored to be secondary to underdistention. Correlation with urinalysis is recommended. 5. Advanced atherosclerotic changes as detailed above resulting in high-grade stenosis of the right common iliac and right external iliac arteries. Electronically Signed   By: Katherine Mantlehristopher  Green M.D.   On: 08/31/2018 19:17    Procedures Procedures (including critical care time)  Medications Ordered in ED Medications  ondansetron (ZOFRAN) injection 4 mg (has no administration in time range)  HYDROmorphone (DILAUDID) injection 1 mg (has no administration in time range)  sodium chloride 0.9 % bolus 1,000 mL (has no administration in time range)     Initial Impression / Assessment and Plan / ED Course  I have reviewed the triage vital signs and the nursing notes.  Pertinent labs & imaging results that were available during my care of the patient were reviewed by me and considered in my medical decision making (see chart for details).  Clinical Course as of Sep 01 1019   Sat Aug 31, 2018  [id number] 58 year old male with multiple chronic medical issues here with 3 days of lower abdominal pain nausea vomiting.  He says this is similar to his pancreatitis although he has not drank for a while.  He says this also similar to his IBS and his diverticulitis.  No fever here.  Differential diagnosis includes pancreatitis, diverticulitis, obstruction, irritable bowel   [MB]  1923 Patient does have  some elevation in his white blood cell count of 15.8.  Hemoglobin low but at baseline.  Lipase and LFTs unremarkable.  CT does not show any serious findings although they do comment upon some diverticulosis and some atherosclerotic disease and some bladder wall thickening.   [MB]  1930 Reevaluated patient and updated on his results.  He understands he needs to give a urine sample.  He said he could use another dose of Dilaudid as that is what they usually do for him and then he feels like he probably would be able to go home.  He understands that I will not discharge him with any narcotic pain medicine.   [MB]  1933 Patient says he was aware of the atherosclerotic disease and was supposed to get stenting done in January but it was not covered by his insurance.   [MB]    Clinical Course User Index [MB] Terrilee FilesButler, Maley Venezia C, MD        Final Clinical Impressions(s) / ED Diagnoses   Final diagnoses:  Left lower quadrant abdominal pain    ED Discharge Orders         Ordered    dicyclomine (BENTYL) 20 MG tablet  2 times daily     08/31/18 2018           Terrilee FilesButler, Teyton Pattillo C, MD 09/01/18 1021

## 2018-09-02 ENCOUNTER — Encounter (HOSPITAL_BASED_OUTPATIENT_CLINIC_OR_DEPARTMENT_OTHER): Payer: Self-pay | Admitting: *Deleted

## 2018-09-02 ENCOUNTER — Other Ambulatory Visit: Payer: Self-pay

## 2018-09-02 ENCOUNTER — Emergency Department (HOSPITAL_BASED_OUTPATIENT_CLINIC_OR_DEPARTMENT_OTHER)
Admission: EM | Admit: 2018-09-02 | Discharge: 2018-09-02 | Disposition: A | Discharge status: Home / Self Care | Payer: Medicaid Other | Attending: Emergency Medicine | Admitting: Emergency Medicine

## 2018-09-02 DIAGNOSIS — R112 Nausea with vomiting, unspecified: Secondary | ICD-10-CM | POA: Diagnosis not present

## 2018-09-02 DIAGNOSIS — E119 Type 2 diabetes mellitus without complications: Secondary | ICD-10-CM | POA: Insufficient documentation

## 2018-09-02 DIAGNOSIS — Z7984 Long term (current) use of oral hypoglycemic drugs: Secondary | ICD-10-CM | POA: Insufficient documentation

## 2018-09-02 DIAGNOSIS — F1210 Cannabis abuse, uncomplicated: Secondary | ICD-10-CM | POA: Insufficient documentation

## 2018-09-02 DIAGNOSIS — F17210 Nicotine dependence, cigarettes, uncomplicated: Secondary | ICD-10-CM | POA: Insufficient documentation

## 2018-09-02 DIAGNOSIS — R1032 Left lower quadrant pain: Secondary | ICD-10-CM | POA: Diagnosis present

## 2018-09-02 DIAGNOSIS — I10 Essential (primary) hypertension: Secondary | ICD-10-CM | POA: Diagnosis not present

## 2018-09-02 DIAGNOSIS — Z79899 Other long term (current) drug therapy: Secondary | ICD-10-CM | POA: Diagnosis not present

## 2018-09-02 DIAGNOSIS — I2510 Atherosclerotic heart disease of native coronary artery without angina pectoris: Secondary | ICD-10-CM | POA: Diagnosis not present

## 2018-09-02 LAB — CBC WITH DIFFERENTIAL/PLATELET
Abs Immature Granulocytes: 0.03 10*3/uL (ref 0.00–0.07)
Basophils Absolute: 0 10*3/uL (ref 0.0–0.1)
Basophils Relative: 0 %
Eosinophils Absolute: 0.1 10*3/uL (ref 0.0–0.5)
Eosinophils Relative: 1 %
HCT: 33.6 % — ABNORMAL LOW (ref 39.0–52.0)
Hemoglobin: 11 g/dL — ABNORMAL LOW (ref 13.0–17.0)
Immature Granulocytes: 0 %
Lymphocytes Relative: 54 %
Lymphs Abs: 6.9 10*3/uL — ABNORMAL HIGH (ref 0.7–4.0)
MCH: 28.6 pg (ref 26.0–34.0)
MCHC: 32.7 g/dL (ref 30.0–36.0)
MCV: 87.3 fL (ref 80.0–100.0)
Monocytes Absolute: 0.8 10*3/uL (ref 0.1–1.0)
Monocytes Relative: 6 %
Neutro Abs: 5.1 10*3/uL (ref 1.7–7.7)
Neutrophils Relative %: 39 %
Platelets: 314 10*3/uL (ref 150–400)
RBC: 3.85 MIL/uL — ABNORMAL LOW (ref 4.22–5.81)
RDW: 19.9 % — ABNORMAL HIGH (ref 11.5–15.5)
WBC: 12.9 10*3/uL — ABNORMAL HIGH (ref 4.0–10.5)
nRBC: 0 % (ref 0.0–0.2)

## 2018-09-02 MED ORDER — SODIUM CHLORIDE 0.9 % IV SOLN
INTRAVENOUS | Status: DC
  Administered 2018-09-02: 20:00:00 via INTRAVENOUS

## 2018-09-02 MED ORDER — FENTANYL CITRATE (PF) 100 MCG/2ML IJ SOLN
25.0000 ug | Freq: Once | INTRAMUSCULAR | Status: AC
  Administered 2018-09-02: 25 ug via INTRAVENOUS
  Filled 2018-09-02: qty 2

## 2018-09-02 MED ORDER — HYDROCODONE-ACETAMINOPHEN 5-325 MG PO TABS: 1.0000 | ORAL_TABLET | Freq: Four times a day (QID) | ORAL | 0 refills | Status: AC | PRN

## 2018-09-02 MED ORDER — HYDROMORPHONE HCL 1 MG/ML IJ SOLN
1.0000 mg | Freq: Once | INTRAMUSCULAR | Status: AC
  Administered 2018-09-02: 1 mg via INTRAVENOUS
  Filled 2018-09-02: qty 1

## 2018-09-02 MED ORDER — SODIUM CHLORIDE 0.9 % IV BOLUS
1000.0000 mL | Freq: Once | INTRAVENOUS | Status: AC
  Administered 2018-09-02: 1000 mL via INTRAVENOUS

## 2018-09-02 MED ORDER — ONDANSETRON HCL 4 MG/2ML IJ SOLN
4.0000 mg | Freq: Once | INTRAMUSCULAR | Status: AC
Start: 2018-09-02 — End: 2018-09-02
  Administered 2018-09-02: 4 mg via INTRAVENOUS
  Filled 2018-09-02: qty 2

## 2018-09-02 NOTE — ED Notes (Signed)
ED Provider at bedside. 

## 2018-09-02 NOTE — ED Triage Notes (Signed)
Abdominal pain for almost a week. He was seen here for same 2 days ago.

## 2018-09-02 NOTE — ED Notes (Signed)
Pt tolerating po fluids, no emesis. Void large amount of clear yellow urine.

## 2018-09-02 NOTE — ED Notes (Signed)
C/o abd pain x nausea x 2 days   Denies diarrhea   Seen here for same 2 days ago

## 2018-09-02 NOTE — ED Provider Notes (Signed)
[facility] EMERGENCY DEPARTMENT Provider Note   CSN: [id number] Arrival date & time: 09/02/18  1608     History   Chief Complaint Chief Complaint  Patient presents with   Abdominal Pain    HPI David Nelson is a 58 y.o. male.     Patient seen in the emergency department on August 8.  Had CT scan done of the abdomen at that time which was negative.  For any acute abdominal findings.  Patient with complaint of abdominal pain mostly left lower quadrant.  Patient is back stating that he has severe pain.  Patient's past medical history significant for coronary artery disease hypertension chronic abdominal pain chronic back pain.  Patient is visiting from [facility].  Patient states that he is had the symptoms for a week.  Has had nausea for the past 2 days.  States to me that he been vomiting a lot in the last 24 hours.     Past Medical History:  Diagnosis Date   CAD (coronary artery disease)    pt says he has been to ER for CP and it runs in family therefore he has CAD   Chronic abdominal pain    Chronic back pain    Depression    Diabetes mellitus without complication (HCC)    GERD (gastroesophageal reflux disease)    Hypertension    IBS (irritable bowel syndrome)    PAD (peripheral artery disease) (HCC)    Pancreatitis    Prostatitis    PVD (peripheral vascular disease) ([place])     Patient Active Problem List   Diagnosis Date Noted   Diabetes mellitus without complication ([place])    IBS (irritable bowel syndrome)    Hypertension    GERD (gastroesophageal reflux disease)    Pancreatitis     History reviewed. No pertinent surgical history.      Home Medications    Prior to Admission medications   Medication Sig Start Date End Date Taking? Authorizing Provider  atorvastatin (LIPITOR) 20 MG tablet TAKE 1 Tablet BY MOUTH EVERY NIGHT AT BEDTIME (REPLACES CRESTOR) 03/01/16  Yes [provider]  B Complex-C-Folic Acid (STRESS  B COMPLEX PO) Take by mouth.   Yes [provider]  dicyclomine (BENTYL) 20 MG tablet Take 1 tablet (20 mg total) by mouth 2 (two) times daily. 08/31/18  Yes Hayden Rasmussen, MD  DULoxetine (CYMBALTA) 30 MG capsule Take 30 mg by mouth 2 (two) times daily.   Yes [provider]  gabapentin (NEURONTIN) 300 MG capsule Take 1 capsule (300 mg total) by mouth 2 (two) times daily. 06/06/16  Yes Soyla Dryer, PA-C  lisinopril (PRINIVIL,ZESTRIL) 10 MG tablet Take 10 mg by mouth daily.   Yes [provider]  metFORMIN (GLUCOPHAGE) 500 MG tablet Take 500 mg by mouth 2 (two) times daily. 03/07/16  Yes [provider]  Multiple Vitamins-Minerals (MULTI FOR HIM 50+ PO) Take by mouth.   Yes [provider]  omeprazole (PRILOSEC) 40 MG capsule Take 40 mg by mouth daily.   Yes [provider]  tamsulosin (FLOMAX) 0.4 MG CAPS capsule Take 0.4 mg by mouth every evening.  01/12/16  Yes [provider]  HYDROcodone-acetaminophen (NORCO/VICODIN) 5-325 MG tablet Take 1 tablet by mouth every 6 (six) hours as needed for moderate pain. 09/02/18   Fredia Sorrow, MD  ibuprofen (ADVIL,MOTRIN) 600 MG tablet Take 600 mg by mouth every 8 (eight) hours as needed.    [provider]    [facility]  History Family History  Problem Relation Age of Onset   Diabetes Mother    Heart disease Mother    Heart disease Father     Social History Social History   Tobacco Use   Smoking status: Current Every Day Smoker    Packs/day: 1.00    Years: 40.00    Pack years: 40.00    Types: Cigarettes   Smokeless tobacco: Never Used  Substance Use Topics   Alcohol use: No    Comment: alcoholic- dry 2013   Drug use: Yes    Types: Marijuana    Comment: daily     Allergies   Patient has no known allergies.   Review of Systems Review of Systems  Constitutional: Negative for chills and fever.  HENT: Negative for rhinorrhea and sore throat.   Eyes:  Negative for visual disturbance.  Respiratory: Negative for cough and shortness of breath.   Cardiovascular: Negative for chest pain and leg swelling.  Gastrointestinal: Positive for abdominal pain, nausea and vomiting. Negative for diarrhea.  Genitourinary: Negative for dysuria.  Musculoskeletal: Negative for back pain and neck pain.  Skin: Negative for rash.  Neurological: Negative for dizziness, light-headedness and headaches.  Hematological: Does not bruise/bleed easily.  Psychiatric/Behavioral: Negative for confusion.     Physical Exam Updated Vital Signs BP 137/78    Pulse 65    Temp 98.4 F (36.9 C) (Oral)    Resp 19    Ht 1.676 m (5\' 6" )    Wt 78 kg    SpO2 100%    BMI 27.76 kg/m   Physical Exam Vitals signs and nursing note reviewed.  Constitutional:      Appearance: Normal appearance. He is well-developed.  HENT:     Head: Normocephalic and atraumatic.  Eyes:     Extraocular Movements: Extraocular movements intact.     Conjunctiva/sclera: Conjunctivae normal.     Pupils: Pupils are equal, round, and reactive to light.  Neck:     Musculoskeletal: Neck supple.  Cardiovascular:     Rate and Rhythm: Normal rate and regular rhythm.     Heart sounds: No murmur.  Pulmonary:     Effort: Pulmonary effort is normal. No respiratory distress.     Breath sounds: Normal breath sounds.  Abdominal:     General: There is no distension.     Palpations: Abdomen is soft.     Tenderness: There is no abdominal tenderness. There is no guarding.  Skin:    General: Skin is warm and dry.     Capillary Refill: Capillary refill takes less than 2 seconds.  Neurological:     General: No focal deficit present.     Mental Status: He is alert and oriented to person, place, and time.      ED Treatments / Results  Labs (all labs ordered are listed, but only abnormal results are displayed) Labs Reviewed  CBC WITH DIFFERENTIAL/PLATELET - Abnormal; Notable for the following components:       Result Value   WBC 12.9 (*)    RBC 3.85 (*)    Hemoglobin 11.0 (*)    HCT 33.6 (*)    RDW 19.9 (*)    Lymphs Abs 6.9 (*)    All other components within normal limits  COMPREHENSIVE METABOLIC PANEL - Abnormal; Notable for the following components:   Potassium 3.3 (*)    Glucose, Bld 126 (*)    Total Protein 6.3 (*)    All other components within normal limits  EKG None  Radiology No results found.  Procedures Procedures (including critical care time)  Medications Ordered in ED Medications  0.9 %  sodium chloride infusion ( Intravenous New Bag/Given 09/02/18 1955)  sodium chloride 0.9 % bolus 1,000 mL (0 mLs Intravenous Stopped 09/02/18 1851)  sodium chloride 0.9 % bolus 1,000 mL ( Intravenous Stopped 09/02/18 1954)  ondansetron (ZOFRAN) injection 4 mg (4 mg Intravenous Given 09/02/18 1845)  fentaNYL (SUBLIMAZE) injection 25 mcg (25 mcg Intravenous Given 09/02/18 1846)  fentaNYL (SUBLIMAZE) injection 25 mcg (25 mcg Intravenous Given 09/02/18 1927)  HYDROmorphone (DILAUDID) injection 1 mg (1 mg Intravenous Given 09/02/18 2254)     Initial Impression / Assessment and Plan / ED Course  I have reviewed the triage vital signs and the nursing notes.  Pertinent labs & imaging results that were available during my care of the patient were reviewed by me and considered in my medical decision making (see chart for details).        Patient was hypotensive on arrival.  Patient does take blood pressure medicines.  However he stated when he was seen 2 days ago he was on them as well and his blood pressure then was 180.  Not tachycardic oxygen sats fine no fever only complaint is abdominal pain had negative CT scan of the abdomen 2 days ago.  Repeated labs here today.  White count improved some at 12,000.  Liver function test without significant abnormalities.  Patient received a liter of fluid and blood pressure came up to 115 systolic.  Patient received another liter of fluid blood pressure  is now up to 137.  Blood pressures came up patient was given some fentanyl for the pain.  And patient also immediately wanted something to eat.  Patient has a history of chronic pain.  May be wonder if perhaps maybe he was going through some withdrawal patient did state that he had vomited a lot may be was low on fluids.  Seem to improve on fluids.  Did not seem to fit a sepsis picture.  Because we did not have any other vital signs that were abnormal.  Was interesting that soon as he got pain medicine patient really got wanted to eat and drink.  Patient also became more talkative.  Either way patient is now been observed for a long period of time and blood pressures have been stable and patient feeling fine.  Labs showed improvement in his white blood cell count.  As mentioned his liver function tests no significant abnormalities.  Patient will be traveling back to [facility] [place] he does have a gastroenterologist there.  Patient's database was reviewed.  He did recently at the end of July received some hydrocodone total 5 tablets.  Patient will be given another short course of hydrocodone.  Feel patient is blood pressure stabilized does not require admission patient now nontoxic.  No acute findings.  Final Clinical Impressions(s) / ED Diagnoses   Final diagnoses:  Left lower quadrant abdominal pain    ED Discharge Orders         Ordered    HYDROcodone-acetaminophen (NORCO/VICODIN) 5-325 MG tablet  Every 6 hours PRN     09/02/18 2333           Vanetta MuldersZackowski, Ivanka Kirshner, MD 09/02/18 2340

## 2018-09-02 NOTE — ED Notes (Signed)
Spoke with MD about pt's c/o pain. MD to evaluate pt.

## 2018-09-02 NOTE — Discharge Instructions (Addendum)
Follow-up with your gastroenterologist when you get back to [facility].  Today's labs show improvement.  Take pain medicine as directed.

## 2018-09-02 NOTE — ED Notes (Signed)
MD informed of pt requesting more pain medication.

## 2018-09-02 NOTE — ED Notes (Signed)
Pt tolerating Po sprite. Denies n/v

## 2018-09-03 LAB — COMPREHENSIVE METABOLIC PANEL
ALT: 16 U/L (ref 0–44)
AST: 19 U/L (ref 15–41)
Albumin: 3.8 g/dL (ref 3.5–5.0)
Alkaline Phosphatase: 48 U/L (ref 38–126)
Anion gap: 10 (ref 5–15)
BUN: 15 mg/dL (ref 6–20)
CO2: 26 mmol/L (ref 22–32)
Calcium: 9 mg/dL (ref 8.9–10.3)
Chloride: 108 mmol/L (ref 98–111)
Creatinine, Ser: 1.14 mg/dL (ref 0.61–1.24)
GFR calc Af Amer: >60 mL/min (ref >60)
GFR calc non Af Amer: >60 mL/min (ref >60)
Glucose, Bld: 126 mg/dL — ABNORMAL HIGH (ref 70–99)
Potassium: 3.3 mmol/L — ABNORMAL LOW (ref 3.5–5.1)
Sodium: 144 mmol/L (ref 135–145)
Total Bilirubin: 0.5 mg/dL (ref 0.3–1.2)
Total Protein: 6.3 g/dL — ABNORMAL LOW (ref 6.5–8.1)

## 2018-10-04 ENCOUNTER — Encounter: Attending: Internal Medicine | Primary: Family

## 2018-10-04 ENCOUNTER — Encounter: Primary: Family

## 2018-10-10 ENCOUNTER — Encounter: Attending: Emergency Medicine | Primary: Family

## 2018-10-23 ENCOUNTER — Inpatient Hospital Stay: Payer: PRIVATE HEALTH INSURANCE | Primary: Family

## 2018-10-23 DIAGNOSIS — I739 Peripheral vascular disease, unspecified: Secondary | ICD-10-CM

## 2018-10-23 LAB — RENAL FUNCTION PANEL
Albumin: 4.7 GM/DL (ref 3.4–5.0)
Anion Gap: 13 (ref 4–16)
BUN: 16 MG/DL (ref 6–23)
CO2: 21 MMOL/L (ref 21–32)
Calcium: 9.3 MG/DL (ref 8.3–10.6)
Chloride: 107 mMol/L (ref 99–110)
Creatinine: 0.8 MG/DL — ABNORMAL LOW (ref 0.9–1.3)
GFR African American: >60 mL/min/{1.73_m2} (ref >60)
GFR Non-African American: >60 mL/min/{1.73_m2} (ref >60)
Glucose: 147 MG/DL — ABNORMAL HIGH (ref 70–99)
Phosphorus: 3.2 MG/DL (ref 2.5–4.9)
Potassium: 4.8 MMOL/L (ref 3.5–5.1)
Sodium: 141 MMOL/L (ref 135–145)

## 2018-10-23 LAB — CBC WITH AUTO DIFFERENTIAL
Bands Absolute: 0.19 10*3/uL
Bands Relative: 1 % — ABNORMAL LOW (ref 5–11)
Eosinophils %: 21 % — ABNORMAL HIGH (ref 0–3)
Eosinophils Absolute: 4 10*3/uL
Hematocrit: 41.6 % — ABNORMAL LOW (ref 42–52)
Hemoglobin: 12.7 GM/DL — ABNORMAL LOW (ref 13.5–18.0)
Lymphocytes %: 38 % (ref 24–44)
Lymphocytes Absolute: 7.2 10*3/uL
MCH: 27.9 PG (ref 27–31)
MCHC: 30.5 % — ABNORMAL LOW (ref 32.0–36.0)
MCV: 91.2 FL (ref 78–100)
MPV: 10.2 FL (ref 7.5–11.1)
Monocytes %: 5 % — ABNORMAL HIGH (ref 0–4)
Monocytes Absolute: 0.9 10*3/uL
Nucleated RBC %: 0 %
PLT Morphology: NORMAL
Platelets: 325 10*3/uL (ref 140–440)
RBC Morphology: NORMAL
RBC: 4.56 10*6/uL — ABNORMAL LOW (ref 4.6–6.2)
RDW: 16.2 % — ABNORMAL HIGH (ref 11.7–14.9)
Segs Absolute: 6.6 10*3/uL
Segs Relative: 35 % — ABNORMAL LOW (ref 36–66)
Total Nucleated RBC: 0 10*3/uL
WBC: 18.9 10*3/uL — ABNORMAL HIGH (ref 4.0–10.5)

## 2018-10-25 ENCOUNTER — Encounter: Primary: Family

## 2018-10-25 ENCOUNTER — Encounter: Payer: PRIVATE HEALTH INSURANCE | Attending: Internal Medicine | Primary: Family

## 2018-11-04 ENCOUNTER — Ambulatory Visit
Admit: 2018-11-04 | Discharge: 2018-11-04 | Payer: PRIVATE HEALTH INSURANCE | Attending: Emergency Medicine | Primary: Family

## 2018-11-04 ENCOUNTER — Encounter

## 2018-11-04 DIAGNOSIS — R42 Dizziness and giddiness: Secondary | ICD-10-CM

## 2018-11-04 NOTE — Progress Notes (Signed)
Pt did not bring medications or medication list to todays appt.

## 2018-11-04 NOTE — Progress Notes (Addendum)
[facility] Linard Millers, MD        OFFICE  FOLLOWUP NOTE    Chief complaints: patient is here for management of CAD, DM, HTN, AFIB, DYSLPIDEMIA    Subjective: + chest pain, no shortness of breath, no dizziness, no palpitations    HPI Ricardo Foley is a 58 y.o.year old who  has a past medical history of CAD (coronary artery disease), Chronic back pain, Diabetes ([place]), H/O cardiac catheterization, H/O cardiovascular stress test, H/O Doppler lower arterial ultrasound, H/O echocardiogram, Hyperlipidemia, Hypertension, Nerve pain, PVD (peripheral vascular disease) ([place]), and Scoliosis. and presents for management of CAD, DM, HTN, AFIB, which are well controlled  He had rt iliac artery stent, also had one time chest pain, he had small distal LCx subtotala occlusion which is medically treated.    Current Outpatient Medications   Medication Sig Dispense Refill   ??? lubiprostone (AMITIZA) 24 MCG capsule Take 1 capsule by mouth 2 times daily (with meals) 60 capsule 3   ??? clopidogrel (PLAVIX) 75 MG tablet Take 75 mg by mouth daily     ??? ondansetron (ZOFRAN ODT) 4 MG disintegrating tablet Take 1 tablet by mouth every 8 hours as needed for Nausea 15 tablet 0   ??? dicyclomine (BENTYL) 10 MG capsule Take 1 capsule by mouth every 6 hours as needed (Bowel spasms) 20 capsule 0   ??? sucralfate (CARAFATE) 1 GM tablet Take 1 tablet by mouth 4 times daily for 15 days 60 tablet 0   ??? ketorolac (TORADOL) 10 MG tablet Take 1 tablet by mouth every 6 hours as needed for Pain (Patient not taking: Reported on 03/28/2018) 20 tablet 0   ??? aspirin 81 MG chewable tablet Take 1 tablet by mouth daily 90 tablet 3   ??? DULoxetine (CYMBALTA) 60 MG extended release capsule Take 60 mg by mouth daily     ??? gabapentin (NEURONTIN) 400 MG capsule Take 400 mg by mouth 3 times daily.     ??? pantoprazole (PROTONIX) 40 MG tablet Take 40 mg by mouth 2 times daily     ??? B Complex-C-Folic Acid (DIALYVITE TABLET) TABS Take 1 tablet by mouth daily     ??? tamsulosin (FLOMAX)  0.4 MG capsule Take 0.4 mg by mouth daily     ??? albuterol sulfate HFA 108 (90 Base) MCG/ACT inhaler Inhale 2 puffs into the lungs every 6 hours as needed for Wheezing     ??? tiZANidine (ZANAFLEX) 4 MG tablet Take 4 mg by mouth 2 times daily      ??? lisinopril (PRINIVIL;ZESTRIL) 20 MG tablet Take 20 mg by mouth daily     ??? metFORMIN (GLUCOPHAGE) 1000 MG tablet Take 1,000 mg by mouth 2 times daily (with meals)     ??? hydrOXYzine (VISTARIL) 50 MG capsule Take 50 mg by mouth 3 times daily as needed for Itching     ??? atorvastatin (LIPITOR) 20 MG tablet Take 20 mg by mouth daily       No current facility-administered medications for this visit.      Allergies: Patient has no known allergies.  Past Medical History:   Diagnosis Date   ??? CAD (coronary artery disease)    ??? Chronic back pain     Had shots August 2019   ??? Diabetes [facility])     Diagnosed about 2009   ??? H/O cardiac catheterization 08/02/2017     severe single vessel CAD of the distal LCX which is small caliber and not amenable to PCI.   ???  H/O cardiovascular stress test 07/03/2017    scan shows moderate size, severe intensity, non reversible perfusion defect in inferolateral wall, normal LVEF.   ??? H/O Doppler lower arterial ultrasound 07/11/2017    The Right Proximal and mid SFA exhibits an occlusion. The Left SFA exhibits an occlusion between the mid and distal portions. Right ABIs show Severe peripheral arterial disease.   ??? H/O echocardiogram 05/30/2017    Left ventricular systolic function is normal. Ejection fraction is visually estimated at >55%.   ??? Hyperlipidemia    ??? Hypertension    ??? Nerve pain    ??? PVD (peripheral vascular disease) ([place]) 08/02/2017     Severe lower extremity PAD. See abn Peripherial Angio. ( ABN Arterial US.07/11/17)   ??? Scoliosis      Past Surgical History:   Procedure Laterality Date   ??? PR SONO GUIDE NEEDLE BIOPSY       Family History   Problem Relation Age of Onset   ??? Stroke Sister    ??? Heart Attack Brother      Social History     Tobacco  Use   ??? Smoking status: Current Every Day Smoker     Packs/day: 1.50     Types: Cigarettes   ??? Smokeless tobacco: Never Used   Substance Use Topics   ??? Alcohol use: No     Comment: caffeine 1 cup of coffee a day      @ROSAKH @  Review of Systems:   ?? Constitutional: No Fever or Weight Loss   ?? Eyes: No Decreased Vision  ?? ENT: No Headaches, Hearing Loss or Vertigo  ?? Cardiovascular: +chest pain, dyspnea on exertion, palpitations or loss of consciousness  ?? Respiratory: No cough or wheezing    ?? Gastrointestinal: No abdominal pain, appetite loss, blood in stools, constipation, diarrhea or heartburn  ?? Genitourinary: No dysuria, trouble voiding, or hematuria  ?? Musculoskeletal:  No gait disturbance, weakness or joint complaints  ?? Integumentary: No rash or pruritis  ?? Neurological: No TIA or stroke symptoms  ?? Psychiatric: No anxiety or depression  ?? Endocrine: No malaise, fatigue or temperature intolerance  ?? Hematologic/Lymphatic: No bleeding problems, blood clots or swollen lymph nodes  ?? Allergic/Immunologic: No nasal congestion or hives  All systems negative except as marked.   Objective:  BP 100/64 (Site: Left Upper Arm, Position: Standing)    Pulse 68    Ht 5\' 6"  (1.676 m)    Wt 170 lb 6.4 oz (77.3 kg)    BMI 27.50 kg/m??   Wt Readings from Last 3 Encounters:   11/04/18 170 lb 6.4 oz (77.3 kg)   06/12/18 179 lb 10.8 oz (81.5 kg)   06/07/18 164 lb (74.4 kg)     Body mass index is 27.5 kg/m??.  GENERAL - Alert, oriented, pleasant, in no apparent distress,normal grooming  HEENT - pupils are reactive to light and accomodation, cornea intact, conjunctive normal color, ears are normal in exam,throat exam in normal, teeth, gum and palate are normal, oral mucosa is normal without any notation of pallor or cyanosis  Neck - Supple.  No jugular venous distention noted. No carotid bruits, no apical -carotid delay  Respiratory:  Normal breath sounds, No respiratory distress, No wheezing, No chest tenderness.,no use of  accessory muscles, diaphragm movement is normal  Cardiovascular: (PMI) apex non displaced,no lifts no thrills, no s3,no s4, Normal heart rate, Normal rhythm, No murmurs, No rubs, No gallops. Carotid arteries pulse and amplitude are normal no bruit,  no abdominal bruit noted ( normal abdominal aorta ausculation), femoral arteries pulse and amplitude are normal no bruit, pedal pulses are faint to absent  Femoral pulses have normal amplitude, no bruits   Extremities - No cyanosis, clubbing, or significant edema, no varicose veins    Abdomen - No masses, tenderness, or organomegaly, no hepato-splenomegally, no bruits  Musculoskeletal - No significant edema, no kyphosis or scoliosis, no deformity in any extremity noted, muscle strength and tone are normal  Skin: no ulcer,no scar,no stasis dermatitis   Neurologic - alert oriented times 3,Cranial nerves II through XII are grossly intact.  There were no gross focal neurologic abnormalities. All sensory and motor nerves examined and were normal  Psychiatric: normal mood and affect    Lab Results   Component Value Date    CKTOTAL 28 03/18/2018     BNP:  No results found for: BNP  PT/INR:  No results found for: [facility]  Lab Results   Component Value Date    LABA1C 6.8 (H) 06/11/2018    LABA1C 6.6 (H) 09/09/2017     Lab Results   Component Value Date    CHOL 90 06/11/2018    TRIG 158 (H) 06/11/2018    HDL 35 (L) 06/11/2018    LDLDIRECT 42 06/11/2018     Lab Results   Component Value Date    ALT 13 06/11/2018    AST 12 (L) 06/11/2018     TSH:    Lab Results   Component Value Date    TSH 1.42 03/12/2017     Ekg:  nsr  Impression:  Ricardo Foley is a 58 y.o.year old who  has a past medical history of CAD (coronary artery disease), Chronic back pain, Diabetes ([place]), H/O cardiac catheterization, H/O cardiovascular stress test, H/O Doppler lower arterial ultrasound, H/O echocardiogram, Hyperlipidemia, Hypertension, Nerve pain, PVD (peripheral vascular disease) ([place]), and Scoliosis. and  presents with     Plan:  1. Presncope" with standing he is on cymbalta, neurontin and marijua ,will recommend to stop all these medications  2. Chest pain: stable,distal LCX STENOSIS, medical treament is recommended  3. PAD:??as per vascular he had stent possible in rt iliac, will get records.  4. PAIN in neck, back and legs:??as per pain clinic  5. LUKEMIA, AS PER ONCOLOGIST  6. HTN: controlled,??continue ??Lisinopril  7. DM: CONTINUE insulin  8. Dyslipidemia; stable, continue statinsAll labs, medications and tests reviewed, continue all other medications of all above medical condition listed as is.    @SIGNATURE @

## 2018-11-07 ENCOUNTER — Inpatient Hospital Stay
Admit: 2018-11-07 | Discharge: 2018-11-07 | Disposition: A | Discharge status: Home / Self Care | Payer: PRIVATE HEALTH INSURANCE

## 2018-11-07 ENCOUNTER — Emergency Department: Admit: 2018-11-07 | Payer: PRIVATE HEALTH INSURANCE | Primary: Family

## 2018-11-07 DIAGNOSIS — I739 Peripheral vascular disease, unspecified: Secondary | ICD-10-CM

## 2018-11-07 LAB — CBC WITH AUTO DIFFERENTIAL
Basophils %: 0.4 % (ref 0–1)
Basophils Absolute: 0.1 10*3/uL
Eosinophils %: 1.8 % (ref 0–3)
Eosinophils Absolute: 0.3 10*3/uL
Hematocrit: 40.1 % — ABNORMAL LOW (ref 42–52)
Hemoglobin: 12.4 GM/DL — ABNORMAL LOW (ref 13.5–18.0)
Immature Neutrophil %: 0.4 % (ref 0–0.43)
Lymphocytes %: 37.1 % (ref 24–44)
Lymphocytes Absolute: 6.2 10*3/uL
MCH: 27.7 PG (ref 27–31)
MCHC: 30.9 % — ABNORMAL LOW (ref 32.0–36.0)
MCV: 89.5 FL (ref 78–100)
MPV: 8.8 FL (ref 7.5–11.1)
Monocytes %: 2.1 % (ref 0–4)
Monocytes Absolute: 0.4 10*3/uL
Nucleated RBC %: 0 %
Platelets: 468 10*3/uL — ABNORMAL HIGH (ref 140–440)
RBC: 4.48 10*6/uL — ABNORMAL LOW (ref 4.6–6.2)
RDW: 15.3 % — ABNORMAL HIGH (ref 11.7–14.9)
Segs Absolute: 9.8 10*3/uL
Segs Relative: 58.2 % (ref 36–66)
Total Immature Neutrophil: 0.07 10*3/uL
Total Nucleated RBC: 0 10*3/uL
WBC: 16.8 10*3/uL — ABNORMAL HIGH (ref 4.0–10.5)

## 2018-11-07 LAB — COMPREHENSIVE METABOLIC PANEL
ALT: 13 U/L (ref 10–40)
AST: 14 IU/L — ABNORMAL LOW (ref 15–37)
Albumin: 4.6 GM/DL (ref 3.4–5.0)
Alkaline Phosphatase: 79 IU/L (ref 40–129)
Anion Gap: 14 (ref 4–16)
BUN: 14 MG/DL (ref 6–23)
CO2: 24 MMOL/L (ref 21–32)
Calcium: 9.9 MG/DL (ref 8.3–10.6)
Chloride: 100 mMol/L (ref 99–110)
Creatinine: 1.1 MG/DL (ref 0.9–1.3)
GFR African American: >60 mL/min/{1.73_m2} (ref >60)
GFR Non-African American: >60 mL/min/{1.73_m2} (ref >60)
Glucose: 176 MG/DL — ABNORMAL HIGH (ref 70–99)
Potassium: 4.8 MMOL/L (ref 3.5–5.1)
Sodium: 138 MMOL/L (ref 135–145)
Total Bilirubin: 0.3 MG/DL (ref 0.0–1.0)
Total Protein: 7.1 GM/DL (ref 6.4–8.2)

## 2018-11-07 LAB — TROPONIN: Troponin T: <0.01 NG/ML (ref <0.01)

## 2018-11-07 LAB — BRAIN NATRIURETIC PEPTIDE: Pro-BNP: 188.6 PG/ML (ref <300)

## 2018-11-07 LAB — LIPASE: Lipase: 25 IU/L (ref 13–60)

## 2018-11-07 LAB — LACTIC ACID: Lactate: 1.6 mMOL/L (ref 0.4–2.0)

## 2018-11-07 MED ORDER — ONDANSETRON 4 MG PO TBDP
4 MG | ORAL_TABLET | Freq: Three times a day (TID) | ORAL | 0 refills | Status: DC | PRN
Start: 2018-11-07 — End: 2019-05-10

## 2018-11-07 MED ORDER — FENTANYL CITRATE (PF) 100 MCG/2ML IJ SOLN
100 | INTRAMUSCULAR | Status: DC | PRN
Start: 2018-11-07 — End: 2018-11-07
  Administered 2018-11-07: 19:00:00 50 ug via INTRAVENOUS

## 2018-11-07 MED ORDER — ONDANSETRON HCL 4 MG/2ML IJ SOLN
4 MG/2ML | INTRAMUSCULAR | Status: DC | PRN
Start: 2018-11-07 — End: 2018-11-07
  Administered 2018-11-07 (×2): 4 mg via INTRAVENOUS

## 2018-11-07 MED ORDER — NITROGLYCERIN 0.4 MG SL SUBL
0.4 MG | Freq: Once | SUBLINGUAL | Status: DC
Start: 2018-11-07 — End: 2018-11-07

## 2018-11-07 MED ORDER — OXYCODONE-ACETAMINOPHEN 5-325 MG PO TABS
5-325 MG | ORAL_TABLET | Freq: Four times a day (QID) | ORAL | 0 refills | Status: AC | PRN
Start: 2018-11-07 — End: 2018-11-09

## 2018-11-07 MED ORDER — IOPAMIDOL 76 % IV SOLN
76 % | Freq: Once | INTRAVENOUS | Status: AC | PRN
Start: 2018-11-07 — End: 2018-11-07
  Administered 2018-11-07: 16:00:00 80 mL via INTRAVENOUS

## 2018-11-07 MED ORDER — NORMAL SALINE FLUSH 0.9 % IV SOLN
0.9 % | INTRAVENOUS | Status: DC | PRN
Start: 2018-11-07 — End: 2018-11-07
  Administered 2018-11-07 (×2): 10 mL via INTRAVENOUS

## 2018-11-07 MED ORDER — ASPIRIN 325 MG PO TABS
325 MG | Freq: Once | ORAL | Status: AC
Start: 2018-11-07 — End: 2018-11-07
  Administered 2018-11-07: 19:00:00 325 mg via ORAL

## 2018-11-07 MED ORDER — KETOROLAC TROMETHAMINE 30 MG/ML IJ SOLN
30 MG/ML | Freq: Once | INTRAMUSCULAR | Status: AC
Start: 2018-11-07 — End: 2018-11-07
  Administered 2018-11-07: 14:00:00 15 mg via INTRAVENOUS

## 2018-11-07 MED ORDER — SODIUM CHLORIDE 0.9 % IV BOLUS
0.9 % | Freq: Once | INTRAVENOUS | Status: AC
Start: 2018-11-07 — End: 2018-11-07
  Administered 2018-11-07: 14:00:00 1000 mL via INTRAVENOUS

## 2018-11-07 MED ORDER — OXYCODONE-ACETAMINOPHEN 5-325 MG PO TABS
5-325 MG | Freq: Once | ORAL | Status: AC
Start: 2018-11-07 — End: 2018-11-07
  Administered 2018-11-07: 14:00:00 1 via ORAL

## 2018-11-07 MED FILL — ISOVUE-370 76 % IV SOLN: 76 % | INTRAVENOUS | Qty: 100

## 2018-11-07 MED FILL — ASPIRIN 325 MG PO TABS: 325 mg | ORAL | Qty: 1

## 2018-11-07 MED FILL — KETOROLAC TROMETHAMINE 30 MG/ML IJ SOLN: 30 MG/ML | INTRAMUSCULAR | Qty: 1

## 2018-11-07 MED FILL — ONDANSETRON HCL 4 MG/2ML IJ SOLN: 4 MG/2ML | INTRAMUSCULAR | Qty: 2

## 2018-11-07 MED FILL — NITROGLYCERIN 0.4 MG SL SUBL: 0.4 mg | SUBLINGUAL | Qty: 25

## 2018-11-07 MED FILL — OXYCODONE-ACETAMINOPHEN 5-325 MG PO TABS: 5-325 MG | ORAL | Qty: 1

## 2018-11-07 MED FILL — FENTANYL CITRATE (PF) 100 MCG/2ML IJ SOLN: 100 MCG/2ML | INTRAMUSCULAR | Qty: 2

## 2018-11-07 NOTE — ED Provider Notes (Signed)
Patient Identification  Ricardo Foley is a 58 y.o. male    Chief Complaint  Abdominal Pain (x3 days, hx IBS) and Chest Pain      HPI  (History provided by patient)  This is a 58 y.o. male with history of IBS, vascular stent placed in right external iliac vein a few weeks ago who was brought in by self for chief complaint of abdominal pain, chest pain.  Patient reports that he has been having abdominal pain for the last 3 days, located in the bilateral lower abdomen, described as dull and cramping, radiates into the bilateral lower back.  States this is been constant since onset, worsening, associated with several episodes of vomiting.  Denies dysuria or hematuria.  No changes in stool.  No radiation in the legs.  No leg pain or numbness or weakness.  He also reports a dull intermittent chest pain that is in the substernal chest, no exacerbating or alleviating factors, no shortness of breath.  He has known history of coronary artery disease, diabetes, vascular disease, hypertension, hyperlipidemia, tobacco abuse.      REVIEW OF SYSTEMS    Constitutional:  Denies fever, chills  HENT:  Denies sore throat or ear pain   Eyes: Denies vision changes, eye pain  Cardiovascular:  Denies syncope.  + CP  Respiratory:  Denies shortness of breath, cough   GI:  + abdominal pain, nausea, vomiting  GU:  Denies dysuria, discharge  Musculoskeletal:  Denies joint pain.  + back pain  Skin:  Denies rash, pruritis  Neurologic:  Denies headache, focal weakness, or sensory changes     See HPI and nursing notes for additional information     I have reviewed the following nursing documentation:  Allergies: No Known Allergies    Past medical history:  has a past medical history of CAD (coronary artery disease), Chronic back pain, Diabetes ([place]), H/O cardiac catheterization (08/02/2017), H/O cardiovascular stress test (07/03/2017), H/O Doppler lower arterial ultrasound (07/11/2017), H/O echocardiogram (05/30/2017), Hyperlipidemia,  Hypertension, Nerve pain, PVD (peripheral vascular disease) ([place]) (08/02/2017), and Scoliosis.    Past surgical history:  has a past surgical history that includes pr sono guide needle biopsy.    Home medications:   Prior to Admission medications    Medication Sig Start Date End Date Taking? Authorizing Provider   carvedilol (COREG) 12.5 MG tablet Take 12.5 mg by mouth 2 times daily 10/01/18  Yes Historical Provider, MD   famotidine (PEPCID) 20 MG tablet Take 20 mg by mouth daily 10/18/18  Yes Historical Provider, MD   FEROSUL 325 (65 Fe) MG tablet Take 325 mg by mouth daily 10/07/18  Yes Historical Provider, MD   lactobacillus (CULTURELLE) CAPS capsule Take 1 capsule by mouth daily 10/01/18  Yes Historical Provider, MD   LINZESS 145 MCG capsule Take 145 mcg by mouth daily 09/05/18  Yes Historical Provider, MD   metoclopramide (REGLAN) 5 MG tablet Take 5 mg by mouth 4 times daily (before meals and nightly) 10/01/18  Yes Historical Provider, MD   clopidogrel (PLAVIX) 75 MG tablet Take 75 mg by mouth daily   Yes Historical Provider, MD   dicyclomine (BENTYL) 10 MG capsule Take 1 capsule by mouth every 6 hours as needed (Bowel spasms) 03/18/18 11/07/18 Yes Darvin Neighbours, DO   aspirin 81 MG chewable tablet Take 1 tablet by mouth daily 03/05/18  Yes Tiffany Lisabeth Devoid, APRN - CNP   DULoxetine (CYMBALTA) 60 MG extended release capsule Take 60 mg by mouth  daily   Yes Historical Provider, MD   gabapentin (NEURONTIN) 400 MG capsule Take 400 mg by mouth 3 times daily.   Yes Historical Provider, MD   B Complex-C-Folic Acid (DIALYVITE TABLET) TABS Take 1 tablet by mouth daily   Yes Historical Provider, MD   tamsulosin (FLOMAX) 0.4 MG capsule Take 0.4 mg by mouth daily   Yes Historical Provider, MD   tiZANidine (ZANAFLEX) 4 MG tablet Take 4 mg by mouth every 8 hours as needed    Yes Historical Provider, MD   lisinopril (PRINIVIL;ZESTRIL) 20 MG tablet Take 20 mg by mouth daily   Yes Historical Provider, MD   metFORMIN (GLUCOPHAGE) 1000 MG  tablet Take 1,000 mg by mouth 2 times daily (with meals)   Yes Historical Provider, MD   hydrOXYzine (VISTARIL) 50 MG capsule Take 50 mg by mouth 3 times daily as needed for Itching   Yes Historical Provider, MD   atorvastatin (LIPITOR) 40 MG tablet Take 40 mg by mouth nightly    Yes Historical Provider, MD   ondansetron (ZOFRAN ODT) 4 MG disintegrating tablet Take 1 tablet by mouth every 8 hours as needed for Nausea 06/07/18   Korinne Lusterio, PA-C   pantoprazole (PROTONIX) 40 MG tablet Take 40 mg by mouth 2 times daily    Historical Provider, MD   albuterol sulfate HFA 108 (90 Base) MCG/ACT inhaler Inhale 2 puffs into the lungs every 6 hours as needed for Wheezing    Historical Provider, MD       Social history:  reports that he has been smoking cigarettes. He has been smoking about 1.50 packs per day. He has never used smokeless tobacco. He reports current drug use. Frequency: 14.00 times per week. Drug: Marijuana. He reports that he does not drink alcohol.    Family history:    Family History   Problem Relation Age of Onset   ??? Stroke Sister    ??? Heart Attack Brother          Exam  BP 120/66    Pulse 68    Temp 98.3 ??F (36.8 ??C) (Oral)    Resp 20    Ht 5\' 6"  (1.676 m)    Wt 165 lb (74.8 kg)    SpO2 100%    BMI 26.63 kg/m??   Nursing note and vitals reviewed.  Constitutional: Well developed, well nourished. No acute distress.    HENT:      Head: Normocephalic and atraumatic.      Ears: External ears normal.      Nose: Nose normal.     Mouth: Membrane mucosa moist and pink.  No posterior oropharynx erythema or tonsillar edema  Eyes: Anicteric sclera. No discharge, PERRL  Neck: Supple. Trachea midline.   Cardiovascular: RRR, no murmurs, rubs, or gallops, radial pulses 2+ bilaterally.  Pulmonary/Chest: Effort normal. No respiratory distress. CTAB. No stridor. No wheezes. No rales.   Abdominal: Soft. TTP in LUQ, LLQ, RLQ. No distension.  No guarding, rebound tenderness, or evidence of ascites.    GU: No CVA  tenderness.     Musculoskeletal: Moves all extremities. No gross deformity.    Neurological: Alert and oriented to person, place, and time. Normal muscle tone.      Skin: Warm and dry. No rash.  Psychiatric: Normal mood and affect. Behavior is normal.      EKG   Please see Dr. Mariel Kansky' note for EKG read.      Radiographs (if obtained):  []  The following  radiograph was interpreted by myself in the absence of a radiologist:   [x]  Radiologist's Report Reviewed:  CT ABDOMEN PELVIS W IV CONTRAST Additional Contrast? None   Final Result   1. No acute abnormality in the abdomen/pelvis.   2. Severe atherosclerosis.   3. Small hiatal hernia.         XR CHEST PORTABLE   Final Result   No acute cardiopulmonary disease                Labs  Results for orders placed or performed during the hospital encounter of 11/07/18   CBC Auto Differential   Result Value Ref Range    WBC 16.8 (H) 4.0 - 10.5 K/CU MM    RBC 4.48 (L) 4.6 - 6.2 M/CU MM    Hemoglobin 12.4 (L) 13.5 - 18.0 GM/DL    Hematocrit 40.1 (L) 42 - 52 %    MCV 89.5 78 - 100 FL    MCH 27.7 27 - 31 PG    MCHC 30.9 (L) 32.0 - 36.0 %    RDW 15.3 (H) 11.7 - 14.9 %    Platelets 468 (H) 140 - 440 K/CU MM    MPV 8.8 7.5 - 11.1 FL    Differential Type AUTOMATED DIFFERENTIAL     Segs Relative 58.2 36 - 66 %    Lymphocytes % 37.1 24 - 44 %    Monocytes % 2.1 0 - 4 %    Eosinophils % 1.8 0 - 3 %    Basophils % 0.4 0 - 1 %    Segs Absolute 9.8 K/CU MM    Lymphocytes Absolute 6.2 K/CU MM    Monocytes Absolute 0.4 K/CU MM    Eosinophils Absolute 0.3 K/CU MM    Basophils Absolute 0.1 K/CU MM    Nucleated RBC % 0.0 %    Total Nucleated RBC 0.0 K/CU MM    Total Immature Neutrophil 0.07 K/CU MM    Immature Neutrophil % 0.4 0 - 0.43 %   CMP   Result Value Ref Range    Sodium 138 135 - 145 MMOL/L    Potassium 4.8 3.5 - 5.1 MMOL/L    Chloride 100 99 - 110 mMol/L    CO2 24 21 - 32 MMOL/L    BUN 14 6 - 23 MG/DL    CREATININE 1.1 0.9 - 1.3 MG/DL    Glucose 176 (H) 70 - 99 MG/DL    Calcium 9.9 8.3 -  10.6 MG/DL    Alb 4.6 3.4 - 5.0 GM/DL    Total Protein 7.1 6.4 - 8.2 GM/DL    Total Bilirubin 0.3 0.0 - 1.0 MG/DL    ALT 13 10 - 40 U/L    AST 14 (L) 15 - 37 IU/L    Alkaline Phosphatase 79 40 - 129 IU/L    GFR Non-African American >60 >60 mL/min/1.16m2    GFR African American >60 >60 mL/min/1.65m2    Anion Gap 14 4 - 16   Troponin   Result Value Ref Range    Troponin T <0.010 <0.01 NG/ML   Brain Natriuretic Peptide   Result Value Ref Range    Pro-BNP 188.6 <300 PG/ML   Lipase   Result Value Ref Range    Lipase 25 13 - 60 IU/L   Lactic Acid, Plasma   Result Value Ref Range    Lactate 1.6 0.4 - 2.0 mMOL/L   EKG 12 Lead   Result Value Ref Range  Ventricular Rate 67 BPM    Atrial Rate 67 BPM    P-R Interval 108 ms    QRS Duration 90 ms    Q-T Interval 408 ms    QTc Calculation (Bazett) 431 ms    P Axis -7 degrees    R Axis -23 degrees    T Axis 29 degrees    Diagnosis       Sinus rhythm with short PR  Otherwise normal ECG  When compared with ECG of 10-Jun-2018 11:09,  No significant change was found           MDM  Patient presents for abdominal pain and chest pain.  Appears to be a vasculopath, has significant atherosclerosis, had stent placed in right external iliac artery few weeks ago.  Has known history of coronary artery disease.  Work-up in ED shows severe atherosclerosis throughout the abdomen.  White blood cell count is elevated but patient does have history of some form of leukemia, this is actually better than previous.  Lactic acid is normal.  On reassessment patient continues to complain of severe pain in his chest and abdomen.  Discussed outpatient follow-up versus admission, patient is requesting admission, given fentanyl, consult placed to hospitalist Dr. Margarita Rana who will admit.     In consideration of current COVID19 pandemic, with effort to minimize unnecessary provider exposure, this patient was seen at bedside by me independently.  However, in compliance with current hospital APP/ED protocol, prior  to admission I did discuss this patient case with emergency department physician, Dr. Mariel Kansky.  Of note, this Pt was NOT admitted to the ICU.    Final Impression  1. Intractable abdominal pain    2. Atherosclerosis    3. Chest pain, unspecified type        Blood pressure 120/66, pulse 68, temperature 98.3 ??F (36.8 ??C), temperature source Oral, resp. rate 20, height 5\' 6"  (1.676 m), weight 165 lb (74.8 kg), SpO2 100 %.     Disposition:  Admit to med/surg floor in stable condition.        Patient was given scripts for the following medications. I counseled patient how to take these medications.     New Prescriptions    No medications on file       This chart was generated using the [employer] dictation system. I created this record but it may contain dictation errors given the limitations of this technology.       Christian Mate Khushi Zupko, PA-C  11/07/18 1531

## 2018-11-07 NOTE — Progress Notes (Signed)
Medication History  [facility]    Patient Name: Ricardo Foley 1960/02/20     Medication history has been completed by: Sela Hua, CPhT    Source(s) of information: patient and insurance claims     Primary Care Physician: Kyra Manges ADAMS, APRN - CNP     Pharmacy: [facility]    Allergies as of 11/07/2018    (No Known Allergies)        Prior to Admission medications    Medication Sig Start Date End Date Taking? Authorizing Provider   carvedilol (COREG) 12.5 MG tablet Take 12.5 mg by mouth 2 times daily 10/01/18  Yes Historical Provider, MD   famotidine (PEPCID) 20 MG tablet Take 20 mg by mouth daily 10/18/18  Yes Historical Provider, MD   FEROSUL 325 (65 Fe) MG tablet Take 325 mg by mouth daily 10/07/18  Yes Historical Provider, MD   lactobacillus (CULTURELLE) CAPS capsule Take 1 capsule by mouth daily 10/01/18  Yes Historical Provider, MD   LINZESS 145 MCG capsule Take 145 mcg by mouth daily 09/05/18  Yes Historical Provider, MD   metoclopramide (REGLAN) 5 MG tablet Take 5 mg by mouth 4 times daily (before meals and nightly) 10/01/18  Yes Historical Provider, MD   clopidogrel (PLAVIX) 75 MG tablet Take 75 mg by mouth daily   Yes Historical Provider, MD   dicyclomine (BENTYL) 10 MG capsule Take 1 capsule by mouth every 6 hours as needed (Bowel spasms) 03/18/18 11/07/18 Yes Darvin Neighbours, DO   aspirin 81 MG chewable tablet Take 1 tablet by mouth daily 03/05/18  Yes Tiffany Lisabeth Devoid, APRN - CNP   DULoxetine (CYMBALTA) 60 MG extended release capsule Take 60 mg by mouth daily   Yes Historical Provider, MD   gabapentin (NEURONTIN) 400 MG capsule Take 400 mg by mouth 3 times daily.   Yes Historical Provider, MD   B Complex-C-Folic Acid (DIALYVITE TABLET) TABS Take 1 tablet by mouth daily   Yes Historical Provider, MD   tamsulosin (FLOMAX) 0.4 MG capsule Take 0.4 mg by mouth daily   Yes Historical Provider, MD   tiZANidine (ZANAFLEX) 4 MG tablet Take 4 mg by mouth every 8 hours as needed    Yes Historical  Provider, MD   lisinopril (PRINIVIL;ZESTRIL) 20 MG tablet Take 20 mg by mouth daily   Yes Historical Provider, MD   metFORMIN (GLUCOPHAGE) 1000 MG tablet Take 1,000 mg by mouth 2 times daily (with meals)   Yes Historical Provider, MD   hydrOXYzine (VISTARIL) 50 MG capsule Take 50 mg by mouth 3 times daily as needed for Itching   Yes Historical Provider, MD   atorvastatin (LIPITOR) 40 MG tablet Take 40 mg by mouth nightly    Yes Historical Provider, MD   ondansetron (ZOFRAN ODT) 4 MG disintegrating tablet Take 1 tablet by mouth every 8 hours as needed for Nausea 06/07/18   Korinne Lusterio, PA-C   pantoprazole (PROTONIX) 40 MG tablet Take 40 mg by mouth 2 times daily    Historical Provider, MD   albuterol sulfate HFA 108 (90 Base) MCG/ACT inhaler Inhale 2 puffs into the lungs every 6 hours as needed for Wheezing    Historical Provider, MD     Medications added or changed (ex. new medication, dosage change, interval change, formulation change):  Famotidine 20 mg daily (added)  Ferrous sulfate 325 mg daily (added)  Culturelle daily (added)  Metoclopramide 5 mg 4 times daily (added)  Tizanidine freq change from  BID to TID   Lubiprostone changed to Linzess  145 mcg (lubiprostone ordered)    Medications removed from list (include reason, ex. noncompliance, medication cost, therapy complete etc.):   Ketorolac patient no longer taking  Sucralfate patient no longer taking    Comments:  Medication list reviewed with patient and insurance claims verified.  Patient states he took first doses of medications today, however reports they did not stay down.    To my knowledge the above medication history is accurate as of 11/07/2018 3:14 PM.   Sela Hua, CPhT   11/07/2018 3:14 PM

## 2018-11-07 NOTE — Progress Notes (Signed)
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

## 2018-11-07 NOTE — Consults (Signed)
Consult notes      Name:  Ricardo Foley DOB/Age/Sex: 1960/05/18  (58 y.o. male)   MRN & CSN:  [id number] & [id number] Admission Date/Time: 11/07/2018  8:00 AM   Location:  ED30/ED-30 PCP: Kyra Manges ADAMS, [place] [facility] Day: 1    History of Present Illness:     Chief Complaint: Lower abdominal pain   Ricardo Foley is a 58 y.o.  male  who presents with Hx of CAD, PVD, HTN, HLD, Leukemia presented to ER with complaints of abdominal pain. He says pain once in a while, has history of diverticulitis, pancreatitis, IBS. Once in a while laying on the left side causing pain of the left lower abdomen. Also has dull pain in the back. He has been throwing up 5-6 times last 3 days. His GI told him that because of the blockage in the blood vessels. He was worked up several times. He had an episode of chest pain other day when he threw up. His chest pain on the left side. He gets chest pain once in a while.      Ten point ROS reviewed negative, unless as noted above    Objective:       Intake/Output Summary (Last 24 hours) at 11/07/2018 1453  Last data filed at 11/07/2018 1445  Gross per 24 hour   Intake 10 ml   Output --   Net 10 ml      Vitals:   Vitals:    11/07/18 1449   BP: 120/66   Pulse:    Resp:    Temp:    SpO2:      Physical Exam:   General Appearance: alert and oriented to person, place and time, in no acute distress  Cardiovascular: normal rate, regular rhythm, normal S1 and S2, no murmurs  Pulmonary/Chest: clear to auscultation bilaterally  Abdomen: soft, non-tender, non-distended, normal bowel sounds, no masses   Extremities: no cyanosis, clubbing or edema, pulse   Skin: warm and dry, no rash or erythema  Head: normocephalic and atraumatic  Eyes: pupils equal, round, and reactive to light  Neck: supple and non-tender without mass, no thyromegaly   Musculoskeletal: normal range of motion, no joint swelling, deformity or tenderness  Neurological: alert, oriented, normal speech, no focal findings  or movement disorder noted    Past Medical History:      Past Medical History:   Diagnosis Date   ??? CAD (coronary artery disease)    ??? Chronic back pain     Had shots August 2019   ??? Diabetes [facility])     Diagnosed about 2009   ??? H/O cardiac catheterization 08/02/2017     severe single vessel CAD of the distal LCX which is small caliber and not amenable to PCI.   ??? H/O cardiovascular stress test 07/03/2017    scan shows moderate size, severe intensity, non reversible perfusion defect in inferolateral wall, normal LVEF.   ??? H/O Doppler lower arterial ultrasound 07/11/2017    The Right Proximal and mid SFA exhibits an occlusion. The Left SFA exhibits an occlusion between the mid and distal portions. Right ABIs show Severe peripheral arterial disease.   ??? H/O echocardiogram 05/30/2017    Left ventricular systolic function is normal. Ejection fraction is visually estimated at >55%.   ??? Hyperlipidemia    ??? Hypertension    ??? Nerve pain    ??? PVD (peripheral vascular disease) ([place]) 08/02/2017  Severe lower extremity PAD. See abn Peripherial Angio. ( ABN Arterial US.07/11/17)   ??? Scoliosis      PSHX:  has a past surgical history that includes pr sono guide needle biopsy.    Allergies: No Known Allergies    FAM HX: family history includes Heart Attack in his brother; Stroke in his sister.  Soc HX:   Social History     Socioeconomic History   ??? Marital status: Single     Spouse name: None   ??? Number of children: None   ??? Years of education: None   ??? Highest education level: None   Occupational History   ??? None   Social Needs   ??? Financial resource strain: None   ??? Food insecurity     Worry: None     Inability: None   ??? Transportation needs     Medical: None     Non-medical: None   Tobacco Use   ??? Smoking status: Current Every Day Smoker     Packs/day: 1.50     Types: Cigarettes   ??? Smokeless tobacco: Never Used   Substance and Sexual Activity   ??? Alcohol use: No     Comment: caffeine 1 cup of coffee a day   ??? Drug use: Yes      Frequency: 14.0 times per week     Types: Marijuana   ??? Sexual activity: None   Lifestyle   ??? Physical activity     Days per week: None     Minutes per session: None   ??? Stress: None   Relationships   ??? Social Product manager on phone: None     Gets together: None     Attends religious service: None     Active member of club or organization: None     Attends meetings of clubs or organizations: None     Relationship status: None   ??? Intimate partner violence     Fear of current or ex partner: None     Emotionally abused: None     Physically abused: None     Forced sexual activity: None   Other Topics Concern   ??? None   Social History Narrative   ??? None       Medications:   Medications:   ??? nitroGLYCERIN  0.4 mg Sublingual Once   ??? aspirin  325 mg Oral Once      Infusions:   PRN Meds: ondansetron, 4 mg, Q30 Min PRN  sodium chloride flush, 10 mL, PRN  fentanNYL, 50 mcg, Q2H PRN        Labs:     CBC:   Lab Results   Component Value Date    WBC 16.8 11/07/2018    RBC 4.48 11/07/2018    HGB 12.4 11/07/2018    HCT 40.1 11/07/2018    PLT 468 11/07/2018    MCV 89.5 11/07/2018     BMP:    Lab Results   Component Value Date    NA 138 11/07/2018    K 4.8 11/07/2018    CL 100 11/07/2018    CO2 24 11/07/2018    BUN 14 11/07/2018    CREATININE 1.1 11/07/2018    GLUCOSE 176 11/07/2018    CALCIUM 9.9 11/07/2018     Hepatic Function Panel:    Lab Results   Component Value Date    ALKPHOS 79 11/07/2018    AST 14 11/07/2018    ALT  13 11/07/2018    PROT 7.1 11/07/2018    LABALBU 4.6 11/07/2018    BILITOT 0.3 11/07/2018     Magnesium:    Lab Results   Component Value Date    MG 1.7 09/09/2017     Cardiac Enzymes:   Lab Results   Component Value Date    CKTOTAL 28 (L) 03/18/2018    CKTOTAL 58 09/08/2017     LDH:    Lab Results   Component Value Date    LDH 121 01/31/2018     PT/INR:    Lab Results   Component Value Date    PROTIME 12.7 07/31/2017    INR 1.12 07/31/2017     U/A:   Lab Results   Component Value Date    LEUKOCYTESUR  NEGATIVE 06/10/2018    WBCUA NONE SEEN 06/10/2018    RBCUA 6 06/10/2018    BACTERIA NEGATIVE 06/10/2018    SPECGRAV 1.044 06/10/2018    BLOODU NEGATIVE 06/10/2018     ABG:  No results found for: PHART, PCO2ART, PO2ART, O2SATART, HCO3ART, BEART  TSH:    Lab Results   Component Value Date    TSH 1.42 03/12/2017     Cardiac Enzymes:   Lab Results   Component Value Date    CKTOTAL 28 (L) 03/18/2018    CKTOTAL 58 09/08/2017       Assessment and Plan:   Ricardo Foley is a 58 y.o.  male  who presents with abdominal pain    Abdominal pain  Patient described the abdominal pain is being chronic, has been having chronic vomiting off and on.  He has been seeing several doctors as outpatient but not able to figure out what was wrong.  His CT scan of the abdomen looks normal, with no abnormalities.    He denied having any chest pain, states only one episode when he was having vomiting.  He says it is no different than what it was.  He has seen his cardiologist day before yesterday and recommended medical management.    Hence he is being discharged from ED.    Scribe 2 days worth of Percocet.  For abdominal pain.  To quit smoking marijuana.    Electronically signed by Janeal Holmes, MD on 11/07/2018 at 2:53 PM

## 2018-11-07 NOTE — ED Triage Notes (Signed)
Pt to ED with c/o lower abdominal ,pain and generalized chest pain x 3 days. Pt states hx IBS, had peripheral stents placed 3 weeks ago in legs and groin.

## 2018-11-07 NOTE — ED Provider Notes (Signed)
This EKG was interpreted by me. Rate is 67, rhythm is sinus. PR and QT intervals are within normal limits. There is no ST segment or T wave changes. This EKG was compared to previous EKG from date 11/04/2018.     Carolyne Littles, DO  11/07/18 [phone]

## 2018-11-07 NOTE — ED Notes (Signed)
Report received from [place].      Donald Pore, RN  11/07/18 [phone]

## 2018-11-11 ENCOUNTER — Encounter

## 2018-11-13 LAB — EKG 12-LEAD
Atrial Rate: 67 {beats}/min
P Axis: -7 degrees
P-R Interval: 108 ms
Q-T Interval: 408 ms
QRS Duration: 90 ms
QTc Calculation (Bazett): 431 ms
R Axis: -23 degrees
T Axis: 29 degrees
Ventricular Rate: 67 {beats}/min

## 2018-11-14 ENCOUNTER — Encounter

## 2018-11-14 ENCOUNTER — Inpatient Hospital Stay: Payer: PRIVATE HEALTH INSURANCE | Primary: Family

## 2018-11-14 ENCOUNTER — Inpatient Hospital Stay: Admit: 2018-11-14 | Payer: PRIVATE HEALTH INSURANCE | Primary: Family

## 2018-11-14 DIAGNOSIS — Z9889 Other specified postprocedural states: Secondary | ICD-10-CM

## 2018-11-18 ENCOUNTER — Inpatient Hospital Stay: Admit: 2018-11-18 | Discharge: 2018-11-18 | Payer: PRIVATE HEALTH INSURANCE | Primary: Family

## 2018-11-18 ENCOUNTER — Ambulatory Visit
Admit: 2018-11-18 | Discharge: 2018-11-18 | Payer: PRIVATE HEALTH INSURANCE | Attending: Internal Medicine | Primary: Family

## 2018-11-18 DIAGNOSIS — D72829 Elevated white blood cell count, unspecified: Secondary | ICD-10-CM

## 2018-11-18 LAB — CBC WITH AUTO DIFFERENTIAL
Basophils %: 0.8 % (ref 0–1)
Basophils Absolute: 0.1 10*3/uL
Eosinophils %: 16.1 % — ABNORMAL HIGH (ref 0–3)
Eosinophils Absolute: 2.3 10*3/uL
Hematocrit: 37.8 % — ABNORMAL LOW (ref 42–52)
Hemoglobin: 12.9 GM/DL — ABNORMAL LOW (ref 13.5–18.0)
Lymphocytes %: 43 % (ref 24–44)
Lymphocytes Absolute: 6.2 10*3/uL
MCH: 28.7 PG (ref 27–31)
MCHC: 34.1 % (ref 32.0–36.0)
MCV: 84 FL (ref 78–100)
MPV: 9.4 FL (ref 7.5–11.1)
Monocytes %: 7 % — ABNORMAL HIGH (ref 0–4)
Monocytes Absolute: 1 10*3/uL
Platelets: 379 10*3/uL (ref 140–440)
RBC: 4.5 10*6/uL — ABNORMAL LOW (ref 4.6–6.2)
RDW: 15.6 % — ABNORMAL HIGH (ref 11.7–14.9)
Segs Absolute: 4.8 10*3/uL
Segs Relative: 33.1 % — ABNORMAL LOW (ref 36–66)
WBC: 14.5 10*3/uL — ABNORMAL HIGH (ref 4.0–10.5)

## 2018-11-18 LAB — COMPREHENSIVE METABOLIC PANEL
ALT: 9 U/L — ABNORMAL LOW (ref 10–40)
AST: 13 IU/L — ABNORMAL LOW (ref 15–37)
Albumin: 4.6 GM/DL (ref 3.4–5.0)
Alkaline Phosphatase: 80 IU/L (ref 40–129)
Anion Gap: 11 (ref 4–16)
BUN: 11 MG/DL (ref 6–23)
CO2: 28 MMOL/L (ref 21–32)
Calcium: 9.7 MG/DL (ref 8.3–10.6)
Chloride: 99 mMol/L (ref 99–110)
Creatinine: 1 MG/DL (ref 0.9–1.3)
GFR African American: >60 mL/min/{1.73_m2} (ref >60)
GFR Non-African American: >60 mL/min/{1.73_m2} (ref >60)
Glucose: 126 MG/DL — ABNORMAL HIGH (ref 70–99)
Potassium: 4.9 MMOL/L (ref 3.5–5.1)
Sodium: 138 MMOL/L (ref 135–145)
Total Bilirubin: 0.1 MG/DL (ref 0.0–1.0)
Total Protein: 7.1 GM/DL (ref 6.4–8.2)

## 2018-11-18 LAB — LACTATE DEHYDROGENASE: LD: 153 IU/L (ref 120–246)

## 2018-11-18 NOTE — Progress Notes (Signed)
Patient Name: Ricardo Foley  Patient DOB: 1960-05-14  Patient MRN: [id number]     Primary Oncologist: Charisse Klinefelter, MD  WVP:[id number] Carolann Littler, APRN - CNP  GI: Dr Franchot Mimes  Cardiology: Dr Saddie Benders       Date of Service: 11/18/2018      Chief Complaint:   Chief Complaint   Patient presents with   ??? Follow-up        Active Problem list  1. Leukocytosis, unspecified type           HPI:        06/11/2017 He arrived with his partner to the clinic today. Denied any frequent infections. Reported intermittent left chest pain associated shortness of breath on exertion. Denied any lower extremity edema. But reported ankle tenderness last week. Reported claudication pain in the calves. Reported that it feels like charley horses. Reported intermittent palpitations and dizziness. Reported it feels like "body rush" head to feet and also unbalanced gait. Reported dysphagia and odynophagia and sometimes feels neck swelling No fevers or any lymphadenopathy. Reported 10 pounds weight loss in the past 5 weeks and poor appetite. Reported abdominal pain which is lower no radiation especially after bowel movements and urination. Nausea and vomiting. Reported loose stools. Reported some nodular rash on the body. Denied any GU GI bleeding. Denied any night sweats. Reported that she had a biopsy of the thyroid nodule but not enough tissue.    05/11/2017 to the CT scan of the abdomen with IV contrast:IMPRESSION: No acute abdominopelvic abnormality detected.    05/30/17: CT scan of the abdomen pelvis with IV contrast:IMPRESSION: 1. No acute abdominopelvic abnormality. 2. Small hiatal hernia. 3. Minimal sigmoid diverticulosis.    05/31/2017 CT angiogram of the abdomen pelvis:IMPRESSION: 1. Suspected moderate to high-grade stenosis at the origin of the inferior mesenteric artery. The celiac and superior mesenteric arteries are widely patent. 2. High-grade short-segment stenosis at the origin of the right external iliac artery. Beyond the stenosis, the  right external iliac artery is diffusely narrowed by plaque. Ricardo Foley, Ricardo Foley (RS#[id number]) Printed by Remi Deter [[id number]] Page 2 of 3 [email or web address]/[email or web address]... 06/08/2017 3. 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    05/31/2017 echocardiogram:Findings Left Ventricle Left ventricular systolic function is normal. Ejection fraction is visually estimated at >55%. Mild concentric left ventricular hypertrophy. Left Atrium Essentially normal left atrium. Right Atrium Essentially normal right atrium. Right Ventricle Right ventricular systolic pressure of 37 mm Hg consistent with mild pulmonary hypertension. Aortic Valve Normal aortic valve structure and function. Mitral Valve Mild mitral regurgitation is present. Tricuspid Valve Mild tricuspid regurgitation. Pulmonic Valve Trivial pulmonic regurgitation present.  03/12/2017 CBC with WBC of 19.4 hemoglobin of 12.5 hematocrit of 39.2 MCV of 87 platelet count of 495 ANC of 7000 absolute lymphocyte count of 10,000.    05/11/2017 CBC WBC of 19.5 hemoglobin of 12.8 hematocrit of 41.4 MCV of 86.4 and platelet count of 45.7 absolute lymphocyte count of 7 8700.    06/02/2017 CBC WBC of 21 hemoglobin of 12.4 hematocrit of 39.4 MCV of 85.7 platelets of 4416 absolute lymphocyte count of 9200.    05/31/2017 transglutaminase IgA antibody 0. IgA 245.    06/11/2017 flow cytometry revealed monoclonal B-cell palpation with chronic lymphocytic leukemia/small lymphocytic lymphoma compromises approximately 20% of the nucleated cells. Cells were positive for CD5 CD19 and CD20 negative for CD10 and CD103 CD38 was negative was expressed and only 6% of the CD19 and CD5 positive cells    EGD  and colonoscopy reports: 11/17/16, moderate or severe gastritis, but improved. (biopies were taken, but the path report said that there was no specimens). 08/18/16, EGD: severe gastritis with erosions, 08/18/16, colonoscopy  normal colon, ? Proctitis Biopsies on 7/27: mild chronic gastritis with NO H Pylori, rectal biopsies "no significant histopathologic changes"    07/02/17:FISH del 13  IgVH mutated 3.4%    07/11/17: ultrasound:    07/13/17: Ct chest: IMPRESSION:  1. No acute cardiopulmonary disease.  2. No pathologic lymphadenopathy.  3. Nonspecific 3 mm right upper lobe pulmonary nodule. Recommend repeat CT  in 12 months.  Past Medical History  Peripheral artery disease. Next present. Lumbar scoliosis. Hyperlipidemia. Hypertension. Irritable bowel syndrome. Diabetes mellitus. Depression. Thyroid nodule. Fatty liver. Chronic kidney disease. Pancreatitis.    09/08/17: Ct abdomen and pelvis:IMPRESSION: No acute findings within the abdomen or pelvis. No evidence of obstructive uropathy or appendicitis. Colonic diverticulosis with no acute features.      09/08/17: CT cervical spine:IMPRESSION: No acute abnormality of the cervical spine. Multilevel degenerative disc disease and facet arthropathy as outlined above.    09/17/17:MRI L spine:IMPRESSION: 1. No evidence of high grade spinal canal or foraminal narrowing in the lumbar spine. Minimal right foraminal disc bulge at L4-5 results in minor right foramina. Mild facet hypertrophy.    11/10/17: CT abdomen and pelvis:IMPRESSION: 1. No bowel obstruction. No free air or free fluid 2. No urolithiasis or hydronephrosis 3. Patchy pneumonitis in the lung 4. Atherosclerotic calcification    01/31/2018 CBC with WBC of 17.1 hemoglobin of 11.3 hematocrit 33.9 MCV of 79.4 and platelets of 358 LDH 121 CMP with sodium 135 potassium of 5.1 BUN of 14 creatinine of 1 calcium 9 glucose of 226 LFTs within normal limits    03/10/18: Ct abdomen and pelvis:  No acute inflammatory process or bowel obstruction.    ??    Colonic diverticulosis.      03/18/18; Ct abdomen and pelvis:  Impression    1. Fluid-filled small bowel loops with mild mucosal enhancement, possibly a    mild enteritis. ??No evidence of obstruction. ??The  appendix is unremarkable.    2. Otherwise unremarkable CT of the abdomen and pelvis.    ??      2/24/20CXR normal    06/07/18: Ct abdomen and pelvis:  1. No acute intra-abdominal abnormality to account for the patient's symptoms.    2. Moderate to severe atherosclerosis.      06/10/18: Ct abdomen and pelvis:  Patchy layering hyperdensity in the gastric lumen could be seen with contrast    extravasation in the setting of active bleeding, but no definite gastric    abnormality is identified. ??However, the appearance could also be related to    ingested material. ??Consider endoscopy.    ??      11/07/18: Ct abdomen and pelvis:  1. No acute abnormality in the abdomen/pelvis.    2. Severe atherosclerosis.    3. Small hiatal hernia.    ??      11/07/18; CXR normal    Past Medical History  Peripheral artery disease. Next present. Lumbar scoliosis. Hyperlipidemia. Hypertension. Irritable bowel syndrome. Diabetes mellitus. Depression. Thyroid nodule. Fatty liver. Chronic kidney disease. Pancreatitis.    Surgical History  None        Allergies  No known medication allergies    Medications  Per Chart      Social History  Lives with friend. Has 3 children. Smokes 1 pack/day for the past 42 years. Has sometimes  also smoked 1-1/2 to 2 packs. Reported 1 24 case beers per weekend. Smokes marijuana.      Family History  No history of any leukemia lymphoma or any blood dyscrasias. Denied any malignancy in the family.    Interval History  11/18/2018: Arrived alone to the clinic today. Reported that he travelled 14 hrs in [place] to [facility] and was admitted there for pna. He was intubated and treated with abx. Looks like he has h/o PAD and had stents placed. Continues to have this rush feeling in his body. No fever, night sweats, weight loss. No LAD. Intermittent chest pain. Palpitation and dizziness during the rush. No increased sob. No overt bleeding. Is awaiting ortho referral for neck and back pain as he has h/o spurs. Left abdominal pain  which has been chronic. Nausea and emesis.   Constipation, relieves with Miralax and Linzess as well.  No GU sx.     Review of Systems   Per interval history; otherwise 10 point ROS is negative              Vital Signs:  BP 112/73 (Site: Left Upper Arm, Position: Sitting, Cuff Size: Medium Adult)    Pulse 70    Temp 97 ??F (36.1 ??C) (Infrared)    Resp 16    Ht 5' 6"  (1.676 m)    Wt 167 lb (75.8 kg)    SpO2 93%    BMI 26.95 kg/m??     Physical Exam:    CONSTITUTIONAL: awake, alert, overweight, ambulates with the help of a cane  EYES: No pallor or icterus   ENT: ATNC   NECK: No JVD, neck fullness but did not find any lad  HEMATOLOGIC/LYMPHATIC: no cervical, supraclavicular or axillary lymphadenopathy   LUNGS: coarse bs bilaterally  CARDIOVASCULAR: S1-S2 no murmurs   ABDOMEN:Soft nondistended ttt left lower quadrant, no hepatosplenomegaly bowel sounds positive  NEUROLOGIC: GI  SKIN: Diffuse lipomas in the trunk right upper extremity. RLE healed scar   EXTREMITIES: Mild lower extremity edema. Varicose veins      Labs:    Hematology:  Lab Results   Component Value Date    WBC 14.5 (H) 11/18/2018    RBC 4.50 (L) 11/18/2018    HGB 12.9 (L) 11/18/2018    HCT 37.8 (L) 11/18/2018    MCV 84.0 11/18/2018    MCH 28.7 11/18/2018    MCHC 34.1 11/18/2018    RDW 15.6 (H) 11/18/2018    PLT 379 11/18/2018    MPV 9.4 11/18/2018    BANDSPCT 1 (L) 10/23/2018    SEGSPCT 33.1 (L) 11/18/2018    EOSRELPCT 16.1 (H) 11/18/2018    BASOPCT 0.8 11/18/2018    LYMPHOPCT 43.0 11/18/2018    MONOPCT 7.0 (H) 11/18/2018    BANDABS 0.19 10/23/2018    SEGSABS 4.8 11/18/2018    EOSABS 2.3 11/18/2018    BASOSABS 0.1 11/18/2018    LYMPHSABS 6.2 11/18/2018    MONOSABS 1.0 11/18/2018    DIFFTYPE AUTOMATED DIFFERENTIAL 11/18/2018    ANISOCYTOSIS 1+ 06/11/2018    POLYCHROM 1+ 06/07/2018    WBCMORP NOT REPORTED 03/12/2017    PLTM PLATELETS APPEAR NORMAL  10/23/2018     Lab Results   Component Value Date    ESR 2  RESULT CHECKED   06/11/2017       Chemistry:  Lab  Results   Component Value Date    NA 138 11/07/2018    K 4.8 11/07/2018    CL 100 11/07/2018  CO2 24 11/07/2018    BUN 14 11/07/2018    CREATININE 1.1 11/07/2018    GLUCOSE 176 (H) 11/07/2018    CALCIUM 9.9 11/07/2018    PROT 7.1 11/07/2018    LABALBU 4.6 11/07/2018    BILITOT 0.3 11/07/2018    ALKPHOS 79 11/07/2018    AST 14 (L) 11/07/2018    ALT 13 11/07/2018    LABGLOM >60 11/07/2018    GFRAA >60 11/07/2018    PHOS 3.2 10/23/2018    MG 1.7 (L) 09/09/2017    POCGLU 147 (H) 06/12/2018     Lab Results   Component Value Date    LDH 121 01/31/2018     No components found for: LD  No results found for: TSHHS, T4FREE, FT3    Immunology:  Lab Results   Component Value Date    PROT 7.1 11/07/2018     No results found for: Roland Earl, [facility]  Lab Results   Component Value Date    B2M 2.3 07/02/2017    B2M  07/02/2017     (NOTE)  Performed by [facility],  [address], [place] [address] [phone]  [email or web address], Julio Delgado, MD, Lab. Director         Coagulation Panel:  Lab Results   Component Value Date    PROTIME 12.7 (H) 07/31/2017    INR 1.12 07/31/2017    APTT 30.5 07/31/2017       Anemia Panel:  No results found for: VITAMINB12, FOLATE    Tumor Markers:  No results found for: CA125, CEA, CA199, LABCA2, PSA      Imaging: Reviewed     Pathology:Reviewed     Observations:  Performance Status: ECOG 0  Depression Status: No data recorded        Assessment & Plan:  Absolute Leukocytosis CLL:LDH 137 and ESR 2, flow cytometry consistent with CLL. No B symptoms except for some night sweats. Normal immunoglobulin panel, no frequent infections. B2 M normal. LDH nomal. FISH with del 13. IgVH mutated. ZAP 70 not available at this point but good prognosis per available data. No indications for treatment. Recommend observation.    PAD: is being followed by CTS.     Mild thrombocytosis: Resolved    Thyroid nodule per patient. Follow with Dr. Loel Dubonnet.    Abdominal pain: is being followed by GI. Had multiple Ct  abdomen imaging    Sudden rush feeling : ??etiology    Continue other medical care.    Discussed the above findings and plan with Mr. Staten and he verbalized understanding.    Discussed smoking cessation and marijuana cessation. Discussed healthy lifestyle including regular exercise, diet control, weight loss. Also discussed importance of being up-to-date with age-appropriate screening tools.    Recommend follow-up with primary care physician and other specialists.    Return to clinic April 2021  or earlier if new symptoms.    [place]           Electronically signed by Charisse Klinefelter, MD on 11/18/2018 at 12:01 PM

## 2018-11-18 NOTE — Progress Notes (Signed)
MA Rooming Questions  Patient: Ricardo Foley  MRN: [id number]    Date: 11/18/2018        1. Do you have any new issues?   no         2. Do you need any refills on medications?    no    3. Have you had any imaging done since your last visit?   yes - @ ER     4. Have you been hospitalized or seen in the emergency room since your last visit here?   yes - [facility]     5. Did the patient have a depression screening completed today? No    No data recorded     PHQ-9 Given to (if applicable):               PHQ-9 Score (if applicable):                     []  Positive     []   Negative              Does question #9 need addressed (if applicable)                     []  Yes    []   No               Glenford Peers, MA

## 2018-11-28 ENCOUNTER — Encounter

## 2019-01-07 ENCOUNTER — Encounter

## 2019-01-14 ENCOUNTER — Inpatient Hospital Stay: Admit: 2019-01-14 | Payer: PRIVATE HEALTH INSURANCE | Primary: Family

## 2019-01-14 DIAGNOSIS — M4312 Spondylolisthesis, cervical region: Secondary | ICD-10-CM

## 2019-01-20 ENCOUNTER — Inpatient Hospital Stay: Admit: 2019-01-20 | Payer: PRIVATE HEALTH INSURANCE | Primary: Family

## 2019-01-20 DIAGNOSIS — R202 Paresthesia of skin: Secondary | ICD-10-CM

## 2019-01-20 NOTE — Procedures (Addendum)
Risks and benefits of study discussed.  Specific and common risks of pain and bleeding, as well as uncommon side effects of infection, hematoma, vasovagal episodes.  Patient agreeable to testing and consents to such.    Clinical: Pain and paresthesias in the left hand involving the thumb and index finger.  Initially feels he had symptoms after a difficult venipuncture of the left antecubital fossa.  However, he also endorses chronic neck pain with radiation and believes he has some cervical stenosis on previous imaging.  Positive Spurling's maneuver by history and on examination.    Motor NCS:  Median and ulnar motor amplitudes, latencies and velocities are normal on the left    Sensory NCS:  Left median, ulnar, and radial responses are normal for amplitude and latency    Needle EMG:  Normal findings throughout the left upper limb.    Impression:    #1  regarding cervical radiculopathy symptoms: normal study without axon loss associated with cervical radiculopathy.    However, normal emg can not rule out painful/sensory radiculopathy, without motor axon loss involvement.  Clinical history would suggest probable C6 sensory/painful radiculopathy.  Further clinical correlation recommended.    #2 no evidence of median nerve damage in the left upper limb, nor suggestions of other mononeuropathies, plexopathy or generalized neuropathy.

## 2019-01-20 NOTE — Procedures (Signed)
Risks and benefits of study discussed.  Specific and common risks of pain and bleeding, as well as uncommon side effects of infection, hematoma, vasovagal episodes.  Patient agreeable to testing and consents to such.  ??  Clinical: Pain and paresthesias in the left hand involving the thumb and index finger.  Initially feels he had symptoms after a difficult venipuncture of the left antecubital fossa.  However, he also endorses chronic neck pain with radiation and believes he has some cervical stenosis on previous imaging.  Positive Spurling's maneuver by history and on examination.  ??  Motor NCS:  Median and ulnar motor amplitudes, latencies and velocities are normal on the left  ??  Sensory NCS:  Left median, ulnar, and radial responses are normal for amplitude and latency  ??  Needle EMG:  Normal findings throughout the left upper limb.  ??  Impression:  ??  #1  regarding cervical radiculopathy symptoms: normal study without axon loss associated with cervical radiculopathy.  ??  However, normal emg can not rule out painful/sensory radiculopathy, without motor axon loss involvement.  Clinical history would suggest probable C6 sensory/painful radiculopathy.  Further clinical correlation recommended.  ??  #2 no evidence of median nerve damage in the left upper limb, nor suggestions of other mononeuropathies, plexopathy or generalized neuropathy.  ??

## 2019-02-10 ENCOUNTER — Encounter: Attending: Emergency Medicine | Primary: Family

## 2019-05-10 ENCOUNTER — Emergency Department: Admit: 2019-05-10 | Payer: PRIVATE HEALTH INSURANCE | Primary: Family

## 2019-05-10 ENCOUNTER — Inpatient Hospital Stay
Admit: 2019-05-10 | Discharge: 2019-05-10 | Disposition: A | Discharge status: Home / Self Care | Payer: PRIVATE HEALTH INSURANCE | Attending: Emergency Medicine

## 2019-05-10 DIAGNOSIS — K529 Noninfective gastroenteritis and colitis, unspecified: Secondary | ICD-10-CM

## 2019-05-10 LAB — COMPREHENSIVE METABOLIC PANEL
ALT: 12 U/L (ref 10–40)
AST: 13 IU/L — ABNORMAL LOW (ref 15–37)
Albumin: 4.6 GM/DL (ref 3.4–5.0)
Alkaline Phosphatase: 66 IU/L (ref 40–129)
Anion Gap: 11 (ref 4–16)
BUN: 20 MG/DL (ref 6–23)
CO2: 24 MMOL/L (ref 21–32)
Calcium: 9.1 MG/DL (ref 8.3–10.6)
Chloride: 102 mMol/L (ref 99–110)
Creatinine: 0.9 MG/DL (ref 0.9–1.3)
GFR African American: >60 mL/min/{1.73_m2} (ref >60)
GFR Non-African American: >60 mL/min/{1.73_m2} (ref >60)
Glucose: 168 MG/DL — ABNORMAL HIGH (ref 70–99)
Potassium: 4.4 MMOL/L (ref 3.5–5.1)
Sodium: 137 MMOL/L (ref 135–145)
Total Bilirubin: 0.2 MG/DL (ref 0.0–1.0)
Total Protein: 7.5 GM/DL (ref 6.4–8.2)

## 2019-05-10 LAB — CBC WITH AUTO DIFFERENTIAL
Basophils %: 0.5 % (ref 0–1)
Basophils Absolute: 0.1 10*3/uL
Eosinophils %: 4.9 % — ABNORMAL HIGH (ref 0–3)
Eosinophils Absolute: 0.9 10*3/uL
Hematocrit: 39.8 % — ABNORMAL LOW (ref 42–52)
Hemoglobin: 13.1 GM/DL — ABNORMAL LOW (ref 13.5–18.0)
Immature Neutrophil %: 0.2 % (ref 0–0.43)
Lymphocytes %: 45.8 % — ABNORMAL HIGH (ref 24–44)
Lymphocytes Absolute: 8.5 10*3/uL
MCH: 28.1 PG (ref 27–31)
MCHC: 32.9 % (ref 32.0–36.0)
MCV: 85.4 FL (ref 78–100)
MPV: 9.8 FL (ref 7.5–11.1)
Monocytes %: 4.7 % — ABNORMAL HIGH (ref 0–4)
Monocytes Absolute: 0.9 10*3/uL
Platelets: 329 10*3/uL (ref 140–440)
RBC: 4.66 10*6/uL (ref 4.6–6.2)
RDW: 17 % — ABNORMAL HIGH (ref 11.7–14.9)
Segs Absolute: 8.1 10*3/uL
Segs Relative: 43.9 % (ref 36–66)
Total Immature Neutrophil: 0.04 10*3/uL
WBC: 18.6 10*3/uL — ABNORMAL HIGH (ref 4.0–10.5)

## 2019-05-10 LAB — URINALYSIS
Bacteria, UA: NEGATIVE /HPF
Bilirubin Urine: NEGATIVE MG/DL
Cast Type: NONE SEEN /HPF
Crystal Type: NEGATIVE /HPF
Epithelial Cells, UA: 2 /HPF
Glucose, Urine: NEGATIVE MG/DL
Ketones, Urine: NEGATIVE MG/DL
Leukocyte Esterase, Urine: NEGATIVE
Nitrite Urine, Quantitative: NEGATIVE
Protein, UA: NEGATIVE MG/DL
RBC, UA: 6 /HPF — ABNORMAL HIGH (ref 0–3)
Specific Gravity, UA: 1.01 (ref 1.001–1.035)
Urobilinogen, Urine: 0.2 MG/DL (ref 0.2–1.0)
WBC, UA: NONE SEEN /HPF (ref 0–2)
pH, Urine: 5 (ref 5.0–8.0)

## 2019-05-10 LAB — LIPASE: Lipase: 150 IU/L — ABNORMAL HIGH (ref 13–60)

## 2019-05-10 MED ORDER — HYDROMORPHONE HCL 2 MG/ML IJ SOLN
2 MG/ML | Freq: Once | INTRAMUSCULAR | Status: AC
Start: 2019-05-10 — End: 2019-05-10
  Administered 2019-05-10: 20:00:00 0.5 mg via INTRAVENOUS

## 2019-05-10 MED ORDER — AMOXICILLIN-POT CLAVULANATE 875-125 MG PO TABS
875-125 MG | Freq: Once | ORAL | Status: AC
Start: 2019-05-10 — End: 2019-05-10
  Administered 2019-05-10: 20:00:00 1 via ORAL

## 2019-05-10 MED ORDER — AMOXICILLIN-POT CLAVULANATE 875-125 MG PO TABS
875-125 MG | ORAL_TABLET | Freq: Two times a day (BID) | ORAL | 0 refills | Status: AC
Start: 2019-05-10 — End: 2019-05-20

## 2019-05-10 MED ORDER — ONDANSETRON HCL 4 MG/2ML IJ SOLN
4 MG/2ML | Freq: Four times a day (QID) | INTRAMUSCULAR | Status: DC | PRN
Start: 2019-05-10 — End: 2019-05-10
  Administered 2019-05-10: 18:00:00 4 mg via INTRAVENOUS

## 2019-05-10 MED ORDER — ONDANSETRON HCL 4 MG PO TABS
4 MG | ORAL_TABLET | Freq: Every day | ORAL | 0 refills | Status: AC | PRN
Start: 2019-05-10 — End: ?

## 2019-05-10 MED ORDER — SODIUM CHLORIDE 0.9 % IV BOLUS
0.9 % | Freq: Once | INTRAVENOUS | Status: AC
Start: 2019-05-10 — End: 2019-05-10
  Administered 2019-05-10: 18:00:00 1000 mL via INTRAVENOUS

## 2019-05-10 MED ORDER — MORPHINE SULFATE (PF) 4 MG/ML IJ SOLN
4 MG/ML | Freq: Once | INTRAMUSCULAR | Status: AC
Start: 2019-05-10 — End: 2019-05-10
  Administered 2019-05-10: 18:00:00 4 mg via INTRAVENOUS

## 2019-05-10 MED ORDER — SODIUM CHLORIDE 0.9 % IV BOLUS
0.9 % | Freq: Once | INTRAVENOUS | Status: DC
Start: 2019-05-10 — End: 2019-05-10

## 2019-05-10 MED ORDER — IOPAMIDOL 76 % IV SOLN
76 % | Freq: Once | INTRAVENOUS | Status: AC | PRN
Start: 2019-05-10 — End: 2019-05-10
  Administered 2019-05-10: 19:00:00 75 mL via INTRAVENOUS

## 2019-05-10 MED FILL — HYDROMORPHONE HCL 2 MG/ML IJ SOLN: 2 mg/mL | INTRAMUSCULAR | Qty: 1

## 2019-05-10 MED FILL — MORPHINE SULFATE 4 MG/ML IJ SOLN: 4 mg/mL | INTRAMUSCULAR | Qty: 1

## 2019-05-10 MED FILL — SODIUM CHLORIDE 0.9 % IV SOLN: 0.9 % | INTRAVENOUS | Qty: 1000

## 2019-05-10 MED FILL — ONDANSETRON HCL 4 MG/2ML IJ SOLN: 4 MG/2ML | INTRAMUSCULAR | Qty: 2

## 2019-05-10 MED FILL — ISOVUE-370 76 % IV SOLN: 76 % | INTRAVENOUS | Qty: 100

## 2019-05-10 MED FILL — AMOXICILLIN-POT CLAVULANATE 875-125 MG PO TABS: 875-125 mg | ORAL | Qty: 1

## 2019-05-10 NOTE — ED Notes (Signed)
Urine collected, labeled and walked to lab for processing.        Bryna Colander, RN  05/10/19 1345

## 2019-05-10 NOTE — ED Notes (Signed)
Pt resting watching TV, denies needs at this time.      Donnal Moat, RN  05/10/19 1408

## 2019-05-10 NOTE — ED Provider Notes (Signed)
Triage Chief Complaint:   Abdominal Pain and Nausea    HOPI:  Ricardo Foley is a 59 y.o. male that presents with abdominal pain.  Abdominal pain is described as left lower abdomen, constant for several days, currently 6 out of 10 and nonradiating.  Some associated nausea without vomiting.  Patient has chronic diarrhea secondary to Linzess adherence.  No notable change in bowel movements.  No fevers.  Patient times is urinary urgency without pain.  No known sick contacts.    Patient reports multiple problems with his abdomen including diverticulitis, pancreatitis and irritable bowel syndrome.  Patient has had problems for "20 years".  No significant abdominal surgeries.  Patient is on chronic narcotics for back issues.    ROS:  General:  No fevers, no chills, no weakness  Eyes:  No recent vison changes, no discharge  ENT:  No sore throat, no nasal congestion, no hearing changes  Cardiovascular:  No chest pain, no palpitations  Respiratory:  No shortness of breath, no cough, no wheezing  Gastrointestinal:  + pain, + nausea, no vomiting, no diarrhea  Musculoskeletal:  No muscle pain, no joint pain  Skin:  No rash, no pruritis, no easy bruising  Neurologic:  No speech problems, no headache, no extremity numbness, no extremity tingling, no extremity weakness  Psychiatric:  No anxiety  Genitourinary:  No dysuria, no hematuria  Endocrine:  No unexpected weight gain, no unexpected weight loss  Extremities:  no edema, no pain    Past Medical History:   Diagnosis Date   ??? CAD (coronary artery disease)    ??? Chronic back pain     Had shots August 2019   ??? Diabetes [facility])     Diagnosed about 2009   ??? H/O cardiac catheterization 08/02/2017     severe single vessel CAD of the distal LCX which is small caliber and not amenable to PCI.   ??? H/O cardiovascular stress test 07/03/2017    scan shows moderate size, severe intensity, non reversible perfusion defect in inferolateral wall, normal LVEF.   ??? H/O Doppler lower arterial  ultrasound 07/11/2017    The Right Proximal and mid SFA exhibits an occlusion. The Left SFA exhibits an occlusion between the mid and distal portions. Right ABIs show Severe peripheral arterial disease.   ??? H/O echocardiogram 05/30/2017    Left ventricular systolic function is normal. Ejection fraction is visually estimated at >55%.   ??? Hyperlipidemia    ??? Hypertension    ??? IBS (irritable bowel syndrome)    ??? Nerve pain    ??? Pancreatitis    ??? PVD (peripheral vascular disease) ([place]) 08/02/2017     Severe lower extremity PAD. See abn Peripherial Angio. ( ABN Arterial US.07/11/17)   ??? Scoliosis      Past Surgical History:   Procedure Laterality Date   ??? PR SONO GUIDE NEEDLE BIOPSY       Family History   Problem Relation Age of Onset   ??? Stroke Sister    ??? Heart Attack Brother      Social History     Socioeconomic History   ??? Marital status: Single     Spouse name: Not on file   ??? Number of children: Not on file   ??? Years of education: Not on file   ??? Highest education level: Not on file   Occupational History   ??? Not on file   Social Needs   ??? Financial resource strain: Not on file   ???  Food insecurity     Worry: Not on file     Inability: Not on file   ??? Transportation needs     Medical: Not on file     Non-medical: Not on file   Tobacco Use   ??? Smoking status: Current Every Day Smoker     Packs/day: 1.50     Types: Cigarettes   ??? Smokeless tobacco: Never Used   Substance and Sexual Activity   ??? Alcohol use: No     Comment: caffeine 1 cup of coffee a day   ??? Drug use: Yes     Frequency: 14.0 times per week     Types: Marijuana   ??? Sexual activity: Not on file   Lifestyle   ??? Physical activity     Days per week: Not on file     Minutes per session: Not on file   ??? Stress: Not on file   Relationships   ??? Social Product manager on phone: Not on file     Gets together: Not on file     Attends religious service: Not on file     Active member of club or organization: Not on file     Attends meetings of clubs or  organizations: Not on file     Relationship status: Not on file   ??? Intimate partner violence     Fear of current or ex partner: Not on file     Emotionally abused: Not on file     Physically abused: Not on file     Forced sexual activity: Not on file   Other Topics Concern   ??? Not on file   Social History Narrative   ??? Not on file     Current Facility-Administered Medications   Medication Dose Route Frequency Provider Last Rate Last Admin   ??? 0.9 % sodium chloride bolus  1,000 mL Intravenous Once Genia Harold, MD       ??? ondansetron [facility]) injection 4 mg  4 mg Intravenous Q6H PRN Genia Harold, MD   4 mg at 05/10/19 1403   ??? amoxicillin-clavulanate (AUGMENTIN) 875-125 MG per tablet 1 tablet  1 tablet Oral Once Genia Harold, MD         Current Outpatient Medications   Medication Sig Dispense Refill   ??? oxyCODONE-acetaminophen (PERCOCET) 7.5-325 MG per tablet Take 1 tablet by mouth every 6 hours as needed for Pain.     ??? ondansetron (ZOFRAN) 4 MG tablet Take 1 tablet by mouth daily as needed for Nausea or Vomiting 30 tablet 0   ??? amoxicillin-clavulanate (AUGMENTIN) 875-125 MG per tablet Take 1 tablet by mouth 2 times daily for 10 days 20 tablet 0   ??? carvedilol (COREG) 12.5 MG tablet Take 12.5 mg by mouth 2 times daily     ??? FEROSUL 325 (65 Fe) MG tablet Take 325 mg by mouth daily     ??? lactobacillus (CULTURELLE) CAPS capsule Take 1 capsule by mouth daily     ??? LINZESS 145 MCG capsule Take 145 mcg by mouth daily     ??? clopidogrel (PLAVIX) 75 MG tablet Take 75 mg by mouth daily     ??? aspirin 81 MG chewable tablet Take 1 tablet by mouth daily 90 tablet 3   ??? DULoxetine (CYMBALTA) 60 MG extended release capsule Take 60 mg by mouth daily     ??? gabapentin (NEURONTIN) 400 MG capsule Take 400 mg by mouth 3 times daily.     ???  pantoprazole (PROTONIX) 40 MG tablet Take 40 mg by mouth 2 times daily     ??? B Complex-C-Folic Acid (DIALYVITE TABLET) TABS Take 1 tablet by mouth daily     ??? tamsulosin (FLOMAX) 0.4 MG capsule Take 0.4  mg by mouth daily     ??? albuterol sulfate HFA 108 (90 Base) MCG/ACT inhaler Inhale 2 puffs into the lungs every 6 hours as needed for Wheezing     ??? tiZANidine (ZANAFLEX) 4 MG tablet Take 4 mg by mouth every 8 hours as needed      ??? lisinopril (PRINIVIL;ZESTRIL) 20 MG tablet Take 20 mg by mouth daily     ??? metFORMIN (GLUCOPHAGE) 1000 MG tablet Take 1,000 mg by mouth 2 times daily (with meals)     ??? hydrOXYzine (VISTARIL) 50 MG capsule Take 50 mg by mouth 3 times daily as needed for Itching     ??? atorvastatin (LIPITOR) 40 MG tablet Take 40 mg by mouth nightly      ??? dicyclomine (BENTYL) 10 MG capsule Take 1 capsule by mouth every 6 hours as needed (Bowel spasms) 20 capsule 0     No Known Allergies    Nursing Notes Reviewed    Physical Exam:  ED Triage Vitals [05/10/19 1323]   Enc Vitals Group      BP 123/82      Pulse 71      Resp 16      Temp 96 ??F (35.6 ??C)      Temp src       SpO2 97 %      Weight 176 lb (79.8 kg)      Height 5\' 6"  (1.676 m)      Head Circumference       Peak Flow       Pain Score       Pain Loc       Pain Edu?       Excl. in [place]?        My pulse ox interpretation is - normal    General appearance:  No acute distress.  Appears overall nontoxic.  Skin:  Warm. Dry.  No diaphoresis.  Eye:  Extraocular movements intact.     Ears, nose, mouth and throat:  Oral mucosa moist   Neck:  Trachea midline.   Extremity:  No swelling.  Normal ROM     Heart:  Regular rate and rhythm, normal S1 & S2, no extra heart sounds.    Perfusion:  Intact.    Respiratory:  Lungs clear to auscultation bilaterally.  Respirations nonlabored.     Abdominal: Very active bowel sounds.  Soft.  Point tenderness in left lower quadrant reproducing pain.  No generalized peritoneal signs.  Non distended.  Back:  No CVA tenderness to palpation     Neurological:  Alert and oriented times 3.  No focal neuro deficits.   Ambulatory with a cane.          Psychiatric:  Appropriate    I have reviewed and interpreted all of the currently  available lab results from this visit (if applicable):  Results for orders placed or performed during the hospital encounter of 05/10/19   CBC auto diff   Result Value Ref Range    WBC 18.6 (H) 4.0 - 10.5 K/CU MM    RBC 4.66 4.6 - 6.2 M/CU MM    Hemoglobin 13.1 (L) 13.5 - 18.0 GM/DL    Hematocrit 39.8 (L) 42 - 52 %  MCV 85.4 78 - 100 FL    MCH 28.1 27 - 31 PG    MCHC 32.9 32.0 - 36.0 %    RDW 17.0 (H) 11.7 - 14.9 %    Platelets 329 140 - 440 K/CU MM    MPV 9.8 7.5 - 11.1 FL    Differential Type AUTOMATED DIFFERENTIAL     Segs Relative 43.9 36 - 66 %    Lymphocytes % 45.8 (H) 24 - 44 %    Monocytes % 4.7 (H) 0 - 4 %    Eosinophils % 4.9 (H) 0 - 3 %    Basophils % 0.5 0 - 1 %    Segs Absolute 8.1 K/CU MM    Lymphocytes Absolute 8.5 K/CU MM    Monocytes Absolute 0.9 K/CU MM    Eosinophils Absolute 0.9 K/CU MM    Basophils Absolute 0.1 K/CU MM    Immature Neutrophil % 0.2 0 - 0.43 %    Total Immature Neutrophil 0.04 K/CU MM   CMP   Result Value Ref Range    Sodium 137 135 - 145 MMOL/L    Potassium 4.4 3.5 - 5.1 MMOL/L    Chloride 102 99 - 110 mMol/L    CO2 24 21 - 32 MMOL/L    BUN 20 6 - 23 MG/DL    CREATININE 0.9 0.9 - 1.3 MG/DL    Glucose 168 (H) 70 - 99 MG/DL    Calcium 9.1 8.3 - 10.6 MG/DL    Albumin 4.6 3.4 - 5.0 GM/DL    Total Protein 7.5 6.4 - 8.2 GM/DL    Total Bilirubin 0.2 0.0 - 1.0 MG/DL    ALT 12 10 - 40 U/L    AST 13 (L) 15 - 37 IU/L    Alkaline Phosphatase 66 40 - 129 IU/L    GFR Non-African American >60 >60 mL/min/1.64m2    GFR African American >60 >60 mL/min/1.53m2    Anion Gap 11 4 - 16   Lipase   Result Value Ref Range    Lipase 150 (H) 13 - 60 IU/L   Urinalysis   Result Value Ref Range    Color, UA YELLOW (A) YELLOW    Clarity, UA CLEAR CLEAR    Glucose, Urine NEGATIVE NEGATIVE MG/DL    Bilirubin Urine NEGATIVE NEGATIVE MG/DL    Ketones, Urine NEGATIVE NEGATIVE MG/DL    Specific Gravity, UA 1.010 1.001 - 1.035    Blood, Urine SMALL (A) NEGATIVE    pH, Urine 5.0 5.0 - 8.0    Protein, UA NEGATIVE  NEGATIVE MG/DL    Urobilinogen, Urine 0.2 0.2 - 1.0 MG/DL    Nitrite Urine, Quantitative NEGATIVE NEGATIVE    Leukocyte Esterase, Urine NEGATIVE NEGATIVE    RBC, UA 6 (H) 0 - 3 /HPF    WBC, UA NO CELLS SEEN 0 - 2 /HPF    Epithelial Cells, UA 2 /HPF    Cast Type NO CAST FORMS SEEN NO CAST FORMS SEEN /HPF    Bacteria, UA NEGATIVE NEGATIVE /HPF    Crystal Type NEGATIVE NEGATIVE /HPF      Radiographs (if obtained):  []  The following radiograph was interpreted by myself in the absence of a radiologist:   [x]  Radiologist's Report Reviewed:  CT ABDOMEN PELVIS W IV CONTRAST Additional Contrast? None   Final Result   Mild sigmoid diverticulosis without acute diverticulitis.  Possible mild   infectious/inflammatory colitis.  EKG (if obtained): (All EKG's are interpreted by myself in the absence of a cardiologist)    Chart review shows recent radiographs:  Ct Abdomen Pelvis W Iv Contrast Additional Contrast? None    Result Date: 05/10/2019  EXAMINATION: CT OF THE ABDOMEN AND PELVIS WITH CONTRAST 05/10/2019 2:24 pm TECHNIQUE: CT of the abdomen and pelvis was performed with the administration of Isovue 370 intravenous contrast. Multiplanar reformatted images are provided for review. Dose modulation, iterative reconstruction, and/or weight based adjustment of the mA/kV was utilized to reduce the radiation dose to as low as reasonably achievable. COMPARISON: 11/07/2018 HISTORY: Acute abdominal pain with diarrhea. FINDINGS: Lower Chest: Unremarkable. Organs: The solid abdominal organs and the gallbladder are unremarkable. GI/Bowel: Liquid stool in the ascending colon with air-fluid levels in the transverse colon.  Mild sigmoid diverticulosis without acute inflammatory changes.  Remainder of the gastrointestinal tract is unremarkable. Pelvis: Bladder is not well distended which accentuates wall thickening. Prostate is not enlarged.  No lymphadenopathy or free fluid.  Right common iliac and external iliac arterial stents.  Peritoneum/Retroperitoneum: No lymphadenopathy or ascites.  Extensive atherosclerotic disease of the abdominal aorta without aneurysm. Bones/Soft Tissues: Unremarkable.     Mild sigmoid diverticulosis without acute diverticulitis.  Possible mild infectious/inflammatory colitis.       MDM:  Pt presents as above.  Emergent conditions considered.  Presentation prompted initial labs and imaging.  Work-up does demonstrate a chronic leukocytosis which patient reports is secondary to his CLL.  His CMP is with mild hyperglycemia without acidosis.  No elevated anion gap.  Lipase is elevated at 150 although acute pancreatitis does not fit patient's overall symptomatology and exam.  Urinalysis is not consistent with a urinary tract infection.  CT imaging is pursued with suspicion for possible diverticulitis.  CT is demonstrating a possible mild infectious/inflammatory colitis.  I will cover patient with Augmentin with first dose given in the emergency department.  Patient is treated with IV fluids, IV morphine, IV Zofran and IV Dilaudid in the emergency department.  Patient remained hemodynamically stable with benign exam.  Patient is appropriate for the outpatient follow-up.  Prescription for Augmentin and Zofran for home in addition to his home Bentyl and narcotic regimen.  Patient has follow-up with his gastroenterologist this upcoming Wednesday which he is encouraged to keep.  Urology follow-up was also provided for patient's more chronic urinary urgency.    I discussed specific signs and symptoms on when to return to the emergency department as well as the need for close outpatient follow-up.  Questions sought and answered with the patient. They voice understanding and agree with plan.       Clinical Impression:  1. Colitis      Disposition referral (if applicable):  Anabel Bene, APRN - CNP   [address]  [place] [address]  [phone]    Schedule an appointment as soon as possible for a visit       Your  Gastroenterologist    Go on 05/14/2019  PLEASE GO TO [place], [place] [address]  [phone]    Schedule an appointment as soon as possible for a visit       Disposition medications (if applicable):  New Prescriptions    AMOXICILLIN-CLAVULANATE (AUGMENTIN) 875-125 MG PER TABLET    Take 1 tablet by mouth 2 times daily for 10 days    ONDANSETRON (ZOFRAN) 4 MG TABLET  Take 1 tablet by mouth daily as needed for Nausea or Vomiting       Comment: Please note this report has been produced using speech recognition software and may contain errors related to that system including errors in grammar, punctuation, and spelling, as well as words and phrases that may be inappropriate. If there are any questions or concerns please feel free to contact the dictating provider for clarification.       Genia Harold, MD  05/10/19 [phone]

## 2019-05-19 ENCOUNTER — Inpatient Hospital Stay: Admit: 2019-05-19 | Discharge: 2019-05-19 | Payer: PRIVATE HEALTH INSURANCE | Primary: Family

## 2019-05-19 ENCOUNTER — Ambulatory Visit
Admit: 2019-05-19 | Discharge: 2019-05-19 | Payer: PRIVATE HEALTH INSURANCE | Attending: Internal Medicine | Primary: Family

## 2019-05-19 DIAGNOSIS — D72829 Elevated white blood cell count, unspecified: Secondary | ICD-10-CM

## 2019-05-19 LAB — CBC WITH AUTO DIFFERENTIAL
Basophils %: 0.3 % (ref 0–1)
Basophils Absolute: 0.1 10*3/uL
Eosinophils %: 5.4 % — ABNORMAL HIGH (ref 0–3)
Eosinophils Absolute: 1.4 10*3/uL
Hematocrit: 36.5 % — ABNORMAL LOW (ref 42–52)
Hemoglobin: 12.4 GM/DL — ABNORMAL LOW (ref 13.5–18.0)
Lymphocytes %: 43.7 % (ref 24–44)
Lymphocytes Absolute: 11.1 10*3/uL
MCH: 28.1 PG (ref 27–31)
MCHC: 34 % (ref 32.0–36.0)
MCV: 82.6 FL (ref 78–100)
MPV: 9.9 FL (ref 7.5–11.1)
Monocytes %: 6.4 % — ABNORMAL HIGH (ref 0–4)
Monocytes Absolute: 1.6 10*3/uL
Platelets: 395 10*3/uL (ref 140–440)
RBC: 4.42 10*6/uL — ABNORMAL LOW (ref 4.6–6.2)
RDW: 17.6 % — ABNORMAL HIGH (ref 11.7–14.9)
Segs Absolute: 11.2 10*3/uL
Segs Relative: 44.2 % (ref 36–66)
WBC: 25.4 10*3/uL — ABNORMAL HIGH (ref 4.0–10.5)

## 2019-05-19 LAB — COMPREHENSIVE METABOLIC PANEL
ALT: 15 U/L (ref 10–40)
AST: 19 IU/L (ref 15–37)
Albumin: 4.4 GM/DL (ref 3.4–5.0)
Alkaline Phosphatase: 63 IU/L (ref 40–129)
Anion Gap: 10 (ref 4–16)
BUN: 9 MG/DL (ref 6–23)
CO2: 28 MMOL/L (ref 21–32)
Calcium: 9.1 MG/DL (ref 8.3–10.6)
Chloride: 101 mMol/L (ref 99–110)
Creatinine: 1 MG/DL (ref 0.9–1.3)
GFR African American: >60 mL/min/{1.73_m2} (ref >60)
GFR Non-African American: >60 mL/min/{1.73_m2} (ref >60)
Glucose: 153 MG/DL — ABNORMAL HIGH (ref 70–99)
Potassium: 4.4 MMOL/L (ref 3.5–5.1)
Sodium: 139 MMOL/L (ref 135–145)
Total Bilirubin: 0.1 MG/DL (ref 0.0–1.0)
Total Protein: 6.3 GM/DL — ABNORMAL LOW (ref 6.4–8.2)

## 2019-05-19 LAB — LACTATE DEHYDROGENASE: LD: 162 IU/L (ref 120–246)

## 2019-06-18 ENCOUNTER — Encounter: Primary: Family

## 2019-07-04 ENCOUNTER — Inpatient Hospital Stay: Admit: 2019-07-04 | Discharge: 2019-07-04 | Payer: PRIVATE HEALTH INSURANCE | Primary: Family

## 2019-07-04 DIAGNOSIS — D72829 Elevated white blood cell count, unspecified: Secondary | ICD-10-CM

## 2019-07-04 LAB — CBC WITH AUTO DIFFERENTIAL
Basophils %: 0.3 % (ref 0–1)
Basophils Absolute: 0.1 10*3/uL
Eosinophils %: 7.4 % — ABNORMAL HIGH (ref 0–3)
Eosinophils Absolute: 1.8 10*3/uL
Hematocrit: 37.2 % — ABNORMAL LOW (ref 42–52)
Hemoglobin: 12.8 GM/DL — ABNORMAL LOW (ref 13.5–18.0)
Lymphocytes %: 37.3 % (ref 24–44)
Lymphocytes Absolute: 9 10*3/uL
MCH: 28.8 PG (ref 27–31)
MCHC: 34.4 % (ref 32.0–36.0)
MCV: 83.8 FL (ref 78–100)
MPV: 9.8 FL (ref 7.5–11.1)
Monocytes %: 6.1 % — ABNORMAL HIGH (ref 0–4)
Monocytes Absolute: 1.5 10*3/uL
Platelets: 403 10*3/uL (ref 140–440)
RBC: 4.44 10*6/uL — ABNORMAL LOW (ref 4.6–6.2)
RDW: 17.3 % — ABNORMAL HIGH (ref 11.7–14.9)
Segs Absolute: 11.8 10*3/uL
Segs Relative: 48.9 % (ref 36–66)
WBC: 24.2 10*3/uL — ABNORMAL HIGH (ref 4.0–10.5)

## 2019-07-17 NOTE — Telephone Encounter (Signed)
Returned call to pt and left message to review lab work.

## 2019-11-17 ENCOUNTER — Encounter: Primary: Family

## 2019-11-17 ENCOUNTER — Encounter: Attending: Internal Medicine | Primary: Family

## 2020-06-05 ENCOUNTER — Encounter (HOSPITAL_BASED_OUTPATIENT_CLINIC_OR_DEPARTMENT_OTHER): Payer: Self-pay | Admitting: Emergency Medicine

## 2020-06-05 ENCOUNTER — Emergency Department (HOSPITAL_BASED_OUTPATIENT_CLINIC_OR_DEPARTMENT_OTHER)
Admission: EM | Admit: 2020-06-05 | Discharge: 2020-06-05 | Disposition: A | Discharge status: Home / Self Care | Payer: Medicaid Other | Attending: Emergency Medicine | Admitting: Emergency Medicine

## 2020-06-05 ENCOUNTER — Emergency Department (HOSPITAL_BASED_OUTPATIENT_CLINIC_OR_DEPARTMENT_OTHER): Payer: Medicaid Other

## 2020-06-05 ENCOUNTER — Other Ambulatory Visit: Payer: Self-pay

## 2020-06-05 DIAGNOSIS — F17210 Nicotine dependence, cigarettes, uncomplicated: Secondary | ICD-10-CM | POA: Diagnosis not present

## 2020-06-05 DIAGNOSIS — I10 Essential (primary) hypertension: Secondary | ICD-10-CM | POA: Insufficient documentation

## 2020-06-05 DIAGNOSIS — E119 Type 2 diabetes mellitus without complications: Secondary | ICD-10-CM | POA: Insufficient documentation

## 2020-06-05 DIAGNOSIS — I2510 Atherosclerotic heart disease of native coronary artery without angina pectoris: Secondary | ICD-10-CM | POA: Insufficient documentation

## 2020-06-05 DIAGNOSIS — R21 Rash and other nonspecific skin eruption: Secondary | ICD-10-CM | POA: Insufficient documentation

## 2020-06-05 DIAGNOSIS — Z8719 Personal history of other diseases of the digestive system: Secondary | ICD-10-CM | POA: Diagnosis not present

## 2020-06-05 DIAGNOSIS — Z79899 Other long term (current) drug therapy: Secondary | ICD-10-CM | POA: Diagnosis not present

## 2020-06-05 DIAGNOSIS — R1032 Left lower quadrant pain: Secondary | ICD-10-CM | POA: Insufficient documentation

## 2020-06-05 DIAGNOSIS — K219 Gastro-esophageal reflux disease without esophagitis: Secondary | ICD-10-CM | POA: Insufficient documentation

## 2020-06-05 DIAGNOSIS — Z7902 Long term (current) use of antithrombotics/antiplatelets: Secondary | ICD-10-CM | POA: Diagnosis not present

## 2020-06-05 DIAGNOSIS — Z7984 Long term (current) use of oral hypoglycemic drugs: Secondary | ICD-10-CM | POA: Diagnosis not present

## 2020-06-05 LAB — COMPREHENSIVE METABOLIC PANEL
ALT: 10 U/L (ref 0–44)
AST: 16 U/L (ref 15–41)
Albumin: 3.9 g/dL (ref 3.5–5.0)
Alkaline Phosphatase: 49 U/L (ref 38–126)
Anion gap: 9 (ref 5–15)
BUN: 12 mg/dL (ref 6–20)
CO2: 24 mmol/L (ref 22–32)
Calcium: 9 mg/dL (ref 8.9–10.3)
Chloride: 102 mmol/L (ref 98–111)
Creatinine, Ser: 1.35 mg/dL — ABNORMAL HIGH (ref 0.61–1.24)
GFR, Estimated: >60 mL/min (ref >60)
Glucose, Bld: 160 mg/dL — ABNORMAL HIGH (ref 70–99)
Potassium: 4.2 mmol/L (ref 3.5–5.1)
Sodium: 135 mmol/L (ref 135–145)
Total Bilirubin: 0.2 mg/dL — ABNORMAL LOW (ref 0.3–1.2)
Total Protein: 6.9 g/dL (ref 6.5–8.1)

## 2020-06-05 LAB — CBC
HCT: 34 % — ABNORMAL LOW (ref 39.0–52.0)
Hemoglobin: 11 g/dL — ABNORMAL LOW (ref 13.0–17.0)
MCH: 25.1 pg — ABNORMAL LOW (ref 26.0–34.0)
MCHC: 32.4 g/dL (ref 30.0–36.0)
MCV: 77.4 fL — ABNORMAL LOW (ref 80.0–100.0)
Platelets: 395 10*3/uL (ref 150–400)
RBC: 4.39 MIL/uL (ref 4.22–5.81)
RDW: 18.2 % — ABNORMAL HIGH (ref 11.5–15.5)
WBC: 16.2 10*3/uL — ABNORMAL HIGH (ref 4.0–10.5)
nRBC: 0 % (ref 0.0–0.2)

## 2020-06-05 LAB — URINALYSIS, ROUTINE W REFLEX MICROSCOPIC
Glucose, UA: NEGATIVE mg/dL
Hgb urine dipstick: NEGATIVE
Ketones, ur: NEGATIVE mg/dL
Leukocytes,Ua: NEGATIVE
Nitrite: NEGATIVE
Protein, ur: NEGATIVE mg/dL
Specific Gravity, Urine: 1.01 (ref 1.005–1.030)
pH: 7.5 (ref 5.0–8.0)

## 2020-06-05 LAB — LIPASE, BLOOD: Lipase: 36 U/L (ref 11–51)

## 2020-06-05 MED ORDER — ONDANSETRON 4 MG PO TBDP
4.0000 mg | ORAL_TABLET | Freq: Once | ORAL | Status: AC | PRN
  Administered 2020-06-05: 4 mg via ORAL
  Filled 2020-06-05: qty 1

## 2020-06-05 MED ORDER — ONDANSETRON 4 MG PO TBDP: 4.0000 mg | ORAL_TABLET | Freq: Three times a day (TID) | ORAL | 0 refills | Status: AC | PRN

## 2020-06-05 MED ORDER — MORPHINE SULFATE (PF) 4 MG/ML IV SOLN
4.0000 mg | Freq: Once | INTRAVENOUS | Status: AC
  Administered 2020-06-05: 4 mg via INTRAVENOUS
  Filled 2020-06-05: qty 1

## 2020-06-05 MED ORDER — AMOXICILLIN-POT CLAVULANATE 875-125 MG PO TABS: 1.0000 | ORAL_TABLET | Freq: Two times a day (BID) | ORAL | 0 refills | Status: AC

## 2020-06-05 NOTE — ED Notes (Signed)
ED Provider at bedside. 

## 2020-06-05 NOTE — Discharge Instructions (Signed)
Your work-up today was reassuring.  I suspect that this is related to your irritable bowel syndrome or diabetic gastroparesis.  Please continue with your dicyclomine at home, as needed.  Have also prescribed you Zofran which can take as needed for nausea symptoms.  There is evidence of enteritis (bowel inflammation) on CT.  Will discharge to home with Augmentin to cover for you for bacterial pathology given the chronicity of your illness.  I cannot emphasize enough the importance of getting established with a primary care provider for ongoing evaluation and management of your health and wellbeing.  I have also referred you to oncology as well as gastroenterology for ongoing evaluation.  They will call you.  Please return to the ED or seek immediate medical attention should you experience any new or worsening symptoms.

## 2020-06-05 NOTE — ED Notes (Signed)
PT CALLED FOR TRIAGE, NO ANSWER

## 2020-06-05 NOTE — ED Provider Notes (Signed)
[facility] EMERGENCY DEPARTMENT Provider Note   CSN: [id number] Arrival date & time: 06/05/20  1436     History Chief Complaint  Patient presents with  . Abdominal Pain    David Nelson is a 60 y.o. male with PMH of HTN, HLD, CAD s/p stent placement on Plavix, type II DM on metformin, diverticulitis, pancreatitis, and IBS on Bentyl presents the ED with a 2-week history of left lower quadrant abdominal pain.  Patient was seen in the ER in [place] on 05/10/2019 for similar symptoms and has been diagnosed with colitis and discharged home with Zofran and Augmentin.  On my examination, patient reports that he had a 5-day history of constipation and went to an urgent care where he was subsequently prescribed a bunch of laxative medications.  He was able to have a couple solid brown, nonbloody bowel movements this morning.  However, he continues to experience left lower quadrant abdominal pain.  He states that he has a history of diverticular disease.  He also states that he has a history of leukemia, but has not yet establish with oncologist.  He is unsure if he is supposed to be on any medications.  He moved here from [place] in October and has not yet established with a primary care provider, gastroenterologist, oncologist, or anybody.  He states that he does have an appointment coming up with a primary care provider in [facility].  Patient states that he had mild nausea that has improved with Zofran given in triage.  He is asking for something for pain.  His history of pancreatitis was in the context of heavy alcohol use, but states that he does not drink anymore.  He also has a 50-pack-year smoking history.  Denies any IVDA or other illicit drug use.  He denies any fevers, chills, chest pain or shortness of breath, melena or hematochezia, room spinning dizziness, or other symptoms.  HPI     Past Medical History:  Diagnosis Date  . CAD (coronary artery disease)    pt says he has  been to ER for CP and it runs in family therefore he has CAD  . Chronic abdominal pain   . Chronic back pain   . Depression   . Diabetes mellitus without complication (HCC)   . GERD (gastroesophageal reflux disease)   . Hypertension   . IBS (irritable bowel syndrome)   . PAD (peripheral artery disease) (HCC)   . Pancreatitis   . Prostatitis   . PVD (peripheral vascular disease) [facility])     Patient Active Problem List   Diagnosis Date Noted  . Diabetes mellitus without complication (HCC)   . IBS (irritable bowel syndrome)   . Hypertension   . GERD (gastroesophageal reflux disease)   . Pancreatitis     History reviewed. No pertinent surgical history.     Family History  Problem Relation Age of Onset  . Diabetes Mother   . Heart disease Mother   . Heart disease Father     Social History   Tobacco Use  . Smoking status: Current Every Day Smoker    Packs/day: 1.00    Years: 40.00    Pack years: 40.00    Types: Cigarettes  . Smokeless tobacco: Never Used  Substance Use Topics  . Alcohol use: No    Comment: alcoholic- dry 2013  . Drug use: Yes    Types: Marijuana    Comment: daily    Home Medications Prior to Admission medications   Medication  Sig Start Date End Date Taking? Authorizing Provider  amoxicillin-clavulanate (AUGMENTIN) 875-125 MG tablet Take 1 tablet by mouth every 12 (twelve) hours. 06/05/20  Yes Lorelee New, PA-C  ondansetron (ZOFRAN ODT) 4 MG disintegrating tablet Take 1 tablet (4 mg total) by mouth every 8 (eight) hours as needed for nausea or vomiting. 06/05/20  Yes Lorelee New, PA-C  atorvastatin (LIPITOR) 20 MG tablet TAKE 1 Tablet BY MOUTH EVERY NIGHT AT BEDTIME (REPLACES CRESTOR) 03/01/16   [provider]  B Complex-C-Folic Acid (STRESS B COMPLEX PO) Take by mouth.    [provider]  dicyclomine (BENTYL) 20 MG tablet Take 1 tablet (20 mg total) by mouth 2 (two) times daily. 08/31/18   Terrilee Files, MD  DULoxetine  (CYMBALTA) 30 MG capsule Take 30 mg by mouth 2 (two) times daily.    [provider]  gabapentin (NEURONTIN) 300 MG capsule Take 1 capsule (300 mg total) by mouth 2 (two) times daily. 06/06/16   Jacquelin Hawking, PA-C  HYDROcodone-acetaminophen (NORCO/VICODIN) 5-325 MG tablet Take 1 tablet by mouth every 6 (six) hours as needed for moderate pain. 09/02/18   Vanetta Mulders, MD  ibuprofen (ADVIL,MOTRIN) 600 MG tablet Take 600 mg by mouth every 8 (eight) hours as needed.    [provider]  lisinopril (PRINIVIL,ZESTRIL) 10 MG tablet Take 10 mg by mouth daily.    [provider]  metFORMIN (GLUCOPHAGE) 500 MG tablet Take 500 mg by mouth 2 (two) times daily. 03/07/16   [provider]  Multiple Vitamins-Minerals (MULTI FOR HIM 50+ PO) Take by mouth.    [provider]  omeprazole (PRILOSEC) 40 MG capsule Take 40 mg by mouth daily.    [provider]  tamsulosin (FLOMAX) 0.4 MG CAPS capsule Take 0.4 mg by mouth every evening.  01/12/16   [provider]    Allergies    Patient has no known allergies.  Review of Systems   Review of Systems  All other systems reviewed and are negative.   Physical Exam Updated Vital Signs BP 135/71 (BP Location: Right Arm)   Pulse 67   Temp 98.3 F (36.8 C) (Oral)   Resp 18   Ht 5\' 6"  (1.676 m)   Wt 67.6 kg   SpO2 100%   BMI 24.05 kg/m   Physical Exam Vitals and nursing note reviewed. Exam conducted with a chaperone present.  Constitutional:      General: He is not in acute distress.    Appearance: He is not ill-appearing.  HENT:     Head: Normocephalic and atraumatic.  Eyes:     General: No scleral icterus.    Conjunctiva/sclera: Conjunctivae normal.  Cardiovascular:     Rate and Rhythm: Normal rate.     Pulses: Normal pulses.  Pulmonary:     Effort: Pulmonary effort is normal. No respiratory distress.  Abdominal:     General: Abdomen is flat. There is no distension.      Palpations: Abdomen is soft.     Tenderness: There is abdominal tenderness.     Comments: Soft, nondistended.  TTP in LLQ.  No significant TTP elsewhere.  Musculoskeletal:     Cervical back: Normal range of motion.  Skin:    General: Skin is dry.     Findings: Rash present.     Comments: Evidence of bleeding on dorsum of forearms bilaterally, patient attributes to itching while on Plavix.  Neurological:     Mental Status: He is alert.  GCS: GCS eye subscore is 4. GCS verbal subscore is 5. GCS motor subscore is 6.  Psychiatric:        Mood and Affect: Mood normal.        Behavior: Behavior normal.        Thought Content: Thought content normal.     ED Results / Procedures / Treatments   Labs (all labs ordered are listed, but only abnormal results are displayed) Labs Reviewed  COMPREHENSIVE METABOLIC PANEL - Abnormal; Notable for the following components:      Result Value   Glucose, Bld 160 (*)    Creatinine, Ser 1.35 (*)    Total Bilirubin 0.2 (*)    All other components within normal limits  CBC - Abnormal; Notable for the following components:   WBC 16.2 (*)    Hemoglobin 11.0 (*)    HCT 34.0 (*)    MCV 77.4 (*)    MCH 25.1 (*)    RDW 18.2 (*)    All other components within normal limits  URINALYSIS, ROUTINE W REFLEX MICROSCOPIC - Abnormal; Notable for the following components:   Bilirubin Urine SMALL (*)    All other components within normal limits  LIPASE, BLOOD    EKG None  Radiology CT ABDOMEN PELVIS WO CONTRAST  Result Date: 06/05/2020 CLINICAL DATA:  Lower abdominal pain.  History of pancreatitis. EXAM: CT ABDOMEN AND PELVIS WITHOUT CONTRAST TECHNIQUE: Multidetector CT imaging of the abdomen and pelvis was performed following the standard protocol without IV contrast. COMPARISON:  January 04, 2020. FINDINGS: Lower chest: Lung bases are clear. There are foci of coronary artery calcification. Hepatobiliary: No focal liver lesions are appreciable on this  noncontrast enhanced study. Gallbladder wall is not appreciably thickened. There is no appreciable biliary duct dilatation. Pancreas: No pancreatic mass or inflammatory focus. No peripancreatic inflammation or fluid. No pancreatic duct dilatation. Spleen: No splenic lesions are evident. Adrenals/Urinary Tract: Adrenals bilaterally appear normal. There is no evident renal mass or hydronephrosis on either side. There is slight vascular calcification in the periphery of the left renal artery. No intrarenal calculi are appreciable. There is no ureteral calculus on either side. Urinary bladder is midline with wall thickness within normal limits. Stomach/Bowel: There are sigmoid and descending colonic diverticula without evident diverticulitis. There is no appreciable bowel wall or mesenteric thickening. Most loops of small bowel are fluid filled. There is no appreciable bowel obstruction. The terminal ileum appears normal. There is no appendiceal region inflammation. No free air or portal venous air. Vascular/Lymphatic: There is no abdominal aortic aneurysm. There is aortic and pelvic arterial vascular calcification. A stent is present in the right common and external iliac arteries extending to the proximal right common femoral artery. Stent also present in a portion of the right superficial femoral artery. No adenopathy evident in the abdomen or pelvis. Reproductive: Prostate and seminal vesicles normal in size and contour. Other: No evident abscess or ascites in the abdomen or pelvis. Musculoskeletal: No blastic or lytic bone lesions. No intramuscular or abdominal wall lesions evident. IMPRESSION: 1. Most small bowel loops are fluid-filled which may indicate a degree of enteritis or ileus. No bowel obstruction evident. 2. Descending colonic and sigmoid diverticula present without evident diverticulitis. 3. No abscess in the abdomen or pelvis. No appendiceal region inflammatory change. 4. No evident renal or ureteral  calculus. No hydronephrosis. Urinary bladder wall thickness normal. 5. Aortic Atherosclerosis (ICD10-I70.0). Extensive pelvic arterial vascular calcification with stent on the right. Foci of coronary artery calcification  also noted. 6.  No evident pancreatitis. Electronically Signed   By: Bretta BangWilliam  Woodruff III M.D.   On: 06/05/2020 19:44    Procedures Procedures   Medications Ordered in ED Medications  ondansetron (ZOFRAN-ODT) disintegrating tablet 4 mg (4 mg Oral Given 06/05/20 1524)  morphine 4 MG/ML injection 4 mg (4 mg Intravenous Given 06/05/20 1841)    ED Course  I have reviewed the triage vital signs and the nursing notes.  Pertinent labs & imaging results that were available during my care of the patient were reviewed by me and considered in my medical decision making (see chart for details).    MDM Rules/Calculators/A&P                          David Nelson was evaluated in Emergency Department on 06/05/2020 for the symptoms described in the history of present illness. He was evaluated in the context of the global COVID-19 pandemic, which necessitated consideration that the patient might be at risk for infection with the SARS-CoV-2 virus that causes COVID-19. Institutional protocols and algorithms that pertain to the evaluation of patients at risk for COVID-19 are in a state of rapid change based on information released by regulatory bodies including the CDC and federal and state organizations. These policies and algorithms were followed during the patient's care in the ED.  I personally reviewed patient's medical chart and all notes from triage and staff during today's encounter. I have also ordered and reviewed all labs and imaging that I felt to be medically necessary in the evaluation of this patient's complaints and with consideration of their physical exam. If needed, translation services were available and utilized.   Will proceed with CT abdomen and pelvis to evaluate for  possible diverticulitis given his left lower quadrant abdominal pain/tenderness in context of known diverticular disease.  Laboratory work-up is notable for leukocytosis to 16.2, but patient has CLL according to ED encounter from [place].  He needs to be established with an oncologist for ongoing evaluation and management.  CMP is without any significant derangement.  Lipase is within normal limits.  UA without evidence of infection or hematuria concerning for obstructing ureterolithiasis.  CT abdomen and pelvis is personally reviewed and demonstrates small bowel loops that are fluid-filled which may indicate a degree of enteritis or ileus.  There is no bowel obstruction.  There is also no diverticulitis, appendicitis, pancreatitis, or other acute intra-abdominal pathology.  He was treated with morphine, but is still complaining of pain.  He is diabetic, may be related to gastroparesis.  He also has a history of IBS.  He needs to be seen by gastroenterology in addition to oncology as well as a primary care provider.  I have put in for a transitions of care consult to assist him with PCP placement.  We will also provide ambulatory referral to gastroenterology.  In the interim, encouraged him to continue using Bentyl as needed for abdominal cramping/spasms.  I am also recommending continued laxatives as needed for his ongoing issues with constipation.  After he was informed that he had very reassuring work-up, he states that he used to get Dilaudid for his pain.  Dilaudid would help.  I told him that narcotic medications will only worsen his ileus.  He states that he had 0 improvement from the 4 mg morphine that I gave him, so giving him additional narcotics likely will not be of any benefit.  He is asking to go home  with Percocets.  He gives me a curious story about how he cannot afford medications as he is on a fixed income and had to change insurance recently which is why does not have a primary care  provider.  I put in for a transitions of care consult as well as ambulatory referrals.    ER return precautions discussed.  Patient voices understanding and is agreeable to the plan.  Final Clinical Impression(s) / ED Diagnoses Final diagnoses:  Left lower quadrant abdominal pain    Rx / DC Orders ED Discharge Orders         Ordered    Ambulatory referral to Gastroenterology        06/05/20 2015    Ambulatory referral to Hematology / Oncology        06/05/20 2015    ondansetron (ZOFRAN ODT) 4 MG disintegrating tablet  Every 8 hours PRN        06/05/20 2015    amoxicillin-clavulanate (AUGMENTIN) 875-125 MG tablet  Every 12 hours        06/05/20 2016           Lorelee New, PA-C 06/05/20 2053    Rolan Bucco, MD 06/05/20 2225

## 2020-06-05 NOTE — ED Notes (Signed)
Pt wanted pain medication prior to having his CT

## 2020-06-05 NOTE — ED Triage Notes (Signed)
Reports LLQ abdominal pain for the last two weeks.  Hx of pancreatitis and IBS.  Also reports n/v.  Endorses being constipated for 5 days.  Had 3-4 bms today.  Taking miralax.

## 2021-03-23 NOTE — Progress Notes (Signed)
Hematology/Oncology Follow-up Visit    Patient Name:  Ricardo Foley  Date of Birth:  Nov 23, 1960  Date of Encounter:  03/23/2021    Referring Provider:  Kennon Holter, NP, [phone]     PCP:  Kennon Holter, NP    Diagnoses:   1. Leukocytosis with a history of CLL B-Cell  FISH Del 13 IgVH mutated ZAP not available    2. Iron deficiency anemia    3 benign chronic neutrophilia       Hematologic/Oncologic History: 06/23/2020  Ricardo Foley is a 62 y.o. male who is seen in consultation at the request of Lyndel Pleasure* for an evaluation of leukocytosis with a history of CLL.    Dx CLL (B-Cell) originally diagnosed 2018 at [facility] by labs. He was being followed for 3 years. He was not given any form of treatment.     -> Telephone referral on 10/16/19: "Dx CLL originally diagnosed 2018 at [facility] by labs, pt referred by Dr Oval Linsey ph [phone]- pt states no biopsies done- Pt is currently seeing Dr Joana Reamer (ph [phone], fax [phone]"    Recent hospital course: Admitted 06/12/20, discharged 06/15/20  "Ricardo Foley is a 61 year old male with a past medical history of CLL, IBS, DM2, HTN, and polysubstance use who presented to the ED on 06/12/2020 after a syncopal episode as well as GI complaints. Work-up revealed AKI, hyponatremia, and hypochloremia. He was started on fluids. AKI resolved. Syncope likely vasovagal secondary to dehydration from nausea/vomiting/diarrhea. Symptoms improved and patient was discharged in good condition on 06/15/2020."    He also has a 1.5 cm nodule in the right thyroid lobe. Per notes, this is known and he has had a biopsy in the past.    He reports h/o chronic pancreatitis and diverticulitis.    Interim Note:   Ricardo Foley returns today for follow up of leukocytosis and iron deficiency anemia. Discussed blood work with patient. He is doing okay today. Patient has been having severe stomach pain for about 22  years and he is not sure why. It is severe that it causes him to vomit. He spoke with his general doctor and they did a scan of his abdomen, but it was clear. He recently had a colonoscopy at [facility] and they were unable to read it because he did not do the prep. He is going to repeat it in a year. He was screened for lung cancer and it was negative for cancer.       Review of Systems:  All other systems are negative.    Current Outpatient Medications:   .  ACCU-CHEK GUIDE ME GLUCOSE MTR Misc, AS DIRECTED, Disp: , Rfl:   .  ACCU-CHEK GUIDE TEST STRIPS Strp test strips, USE AS DIRECTED FOR 90 DAYS, Disp: , Rfl:   .  aspirin 81 MG EC tablet *ANTIPLATELET*, Take 1 tablet (81 mg total) by mouth daily. States is currently out for 3-4 weeks, Disp: , Rfl:   .  atorvastatin (LIPITOR) 20 MG tablet, Take 1 tablet (20 mg total) by mouth nightly., Disp: , Rfl:   .  dicyclomine (BENTYL) 10 MG capsule, Take 2 capsules (20 mg total) by mouth 3 times daily for 30 days. as needed (Patient taking differently: Take 2 capsules (20 mg total) by mouth 3 (three)  times daily as needed. as needed), Disp: 90 capsule, Rfl: 11  .  DULoxetine (CYMBALTA) 60 MG DR capsule, Take 1 capsule (60 mg total) by mouth  daily., Disp: , Rfl:   .  ferrous sulfate (IRON, FERROUS SULFATE,) 325 (65 FE) MG tablet, Take 1 after lunch and dinner (Patient taking differently: Take 1 tablet (325 mg total) by mouth. Take 1 after lunch and dinner), Disp: 60 tablet, Rfl: 11  .  gabapentin (NEURONTIN) 800 MG tablet, Take 1 tablet (800 mg total) by mouth 3 times daily. (Patient taking differently: Take 1 tablet (800 mg total) by mouth 3 times daily. States 4x a day), Disp: 90 tablet, Rfl: 0  .  hydrOXYzine pamoate (VISTARIL) 25 mg capsule, Take 1 capsule (25 mg total) by mouth 3 (three)  times daily as needed., Disp: , Rfl:   .  LINZESS 145 mcg capsule, Take 1 capsule (145 mcg total) by mouth daily., Disp: , Rfl:   .  metFORMIN (GLUCOPHAGE) 500 MG tablet, Take 1  tablet (500 mg total) by mouth 2 times daily with meals., Disp: , Rfl:   .  ondansetron (ZOFRAN) 8 MG tablet, Take 1 tablet (8 mg total) by mouth every 8 (eight) hours as needed for Nausea., Disp: , Rfl:   .  oxyCODONE-acetaminophen (PERCOCET) 7.5-325 mg per tablet, Take 1 tablet by mouth every 6 (six) hours as needed. Takes four times a day, Disp: , Rfl:   .  pantoprazole (PROTONIX) 40 MG tablet, Take 1 tablet (40 mg total) by mouth daily. (Patient taking differently: Take 1 tablet (40 mg total) by mouth every evening.), Disp: 90 tablet, Rfl: 3  .  predniSONE (DELTASONE) 10 MG tablet, TAKE 1 TABLET BY MOUTH DAILY. START 03/11/21, Disp: , Rfl:   .  tamsulosin (FLOMAX) 0.4 mg Cap capsule, Take 1 capsule (0.4 mg total) by mouth daily., Disp: , Rfl:   .  telmisartan (MICARDIS) 40 MG tablet, Take 1 tablet (40 mg total) by mouth daily., Disp: , Rfl:   .  tiZANidine (ZANAFLEX) 4 MG capsule, Take 1 capsule (4 mg total) by mouth 3 (three) times daily as needed for Muscle spasms., Disp: 20 capsule, Rfl: 0  .  traZODone (DESYREL) 150 MG tablet, Take 1 tablet (150 mg total) by mouth nightly., Disp: 30 tablet, Rfl: 0  .  VENTOLIN HFA 90 mcg/actuation inhaler, INHALE 1 PUFF BY MOUTH EVERY 4 HOURS AS NEEDED, Disp: , Rfl:   Past medical history, allergies, current medications, social history and family history were reviewed and updated when appropriate.      Patient Active Problem List   Diagnosis   . Thyroid nodule   . Syncope   . Primary hypertension   . Type 2 diabetes mellitus, without long-term current use of insulin (HCC)   . Polysubstance abuse (HCC)   . Nausea, vomiting, and diarrhea   . CLL (chronic lymphocytic leukemia) (HCC)       No Known Allergies    Physical Examination: ECOG status: 0  Vital Signs: BP 122/75   Pulse 93   Temp 98.7 F (37.1 C) (Oral)   Resp 17   Wt 69.9 kg (154 lb)   SpO2 99%   BMI 24.86 kg/m   General:  Pleasant male in no acute distress  HEENT: Normocephalic, atraumatic. PERRLA, EOMI, sclera  anicteric.  Nose without discharge.  No thrush or mucositis noted on oropharyngeal examination. Oral mucosa moist.  Pharynx had no tonsillar enlargement or exudates.  Neck supple without masses. No thyromegaly present. Trachea midline.  Lymphatic/Immunologic:  No cervical, axillary, or femoral adenopathy.   Cardiovascular:  Regular rate and rhythm, no audible murmurs, gallops, or rubs.  No JVD noted on examination.     Respiratory:  Chest clear to percussion and auscultation; no respiratory distress.     Gastrointestinal:  Abdomen soft with mild diffuse tenderness chronically; no hepatosplenomegaly or other masses. No clinical ascites. No rebound or guarding tenderness.    Extremities:  No edema or suspicious rashes.   Skin:  No pathologic appearing petechiae or bruising noted.   Neurologic: Alert and oriented to person, place and circumstance.  Strength and sensation are grossly intact.  Cranial nerves III through XII grossly intact.  Psychiatric:  Mood and affect are normal. Speech is fluent.    Results:  Results for orders placed or performed in visit on 03/23/21   Comprehensive Metabolic Panel   Result Value Ref Range    Sodium 134 (L) 135 - 146 MMOL/L    Potassium 4.3 3.5 - 5.3 MMOL/L    Chloride 99 98 - 110 MMOL/L    CO2 26 23 - 30 MMOL/L    BUN 10 8 - 24 MG/DL    Glucose 161 (H) 70 - 99 MG/DL    Creatinine 0.96 0.45 - 1.50 MG/DL    Calcium 8.9 8.5 - 40.9 MG/DL    Total Protein 6.7 6.0 - 8.3 G/DL    Albumin  4.2 3.5 - 5.0 G/DL    Total Bilirubin 0.6 0.1 - 1.2 MG/DL    Alkaline Phosphatase 67 25 - 125 IU/L or U/L    AST (SGOT) 13 5 - 40 IU/L or U/L    ALT (SGPT) 12 5 - 50 IU/L or U/L    Anion Gap 9 4 - 14 MMOL/L    Est. GFR >90 >=60 ML/MIN/1.73 M*2    Iron Profile   Result Value Ref Range    TIBC 354 261 - 478 mcg/dL    % SATURATION 10 (L) 15 - 50 %    Iron 37 (L) 50 - 212 mcg/dL    Transferrin 811 914 - 360 MG/DL   CBC and Differential   Result Value Ref Range    WBC 22.8 (H) 4.4 - 11.0 x 10*3/uL    RBC 4.27  (L) 4.50 - 5.90 x 10*6/uL    Hemoglobin 13.2 (L) 14.0 - 17.5 G/DL    Hematocrit 78.2 (L) 41.5 - 50.4 %    MCV 93.3 80.0 - 96.0 FL    MCH 30.8 27.5 - 33.2 PG    MCHC 33.0 33.0 - 37.0 G/DL    RDW 95.6 21.3 - 08.6 %    Platelets 335 150 - 450 X 10*3/uL    MPV 7.4 6.8 - 10.2 FL    Neutrophil % 74 %    Lymphocyte % 18 %    Monocyte % 3 %    Eosinophil % 4 %    Basophil % 1 %    Neutrophil Absolute 16.9 (H) 1.8 - 7.8 x 10*3/uL    Lymphocyte Absolute 4.1 1.0 - 4.8 x 10*3/uL    Monocyte Absolute 0.7 0.0 - 0.8 x 10*3/uL    Eosinophil Absolute 0.8 (H) 0.0 - 0.5 x 10*3/uL    Basophil Absolute 0.2 0.0 - 0.2 x 10*3/uL         Assessment and Plan: 03/23/2021, Last seen 06/23/2020   Initial consult on 06/23/2020    1. Benign neutrophilia with a history of CLL - Patient was diagnosed in 2018 in [place].     His white cell count rose but came back down. His CLL is in spontaneous remission by  labs and PE. Patient is asymptomatic.     His ANC is 16.9 today. We will continue to monitor this. White cell count is 22.8 today with normal lymphocyte count. His white blood cell count has been elevated for 4 years.     His white cell count fluctuates with his inflammatory state (h/o chronic pancreatitis and diverticulitis).    We are trying to get his pathology report.     2. Iron deficiency anemia - followed by GI; I recommend taking oral iron with vitamin C at lunch and dinner    Has been scoped. His hemoglobin is 13.2 and his percent saturation is 10% today. Awaiting ferritin    4. Chronic severe abdominal pain - has been going on for 22 years. CT scan on 11/06/2020 did not show any significant abdominal process. He has a history of IBS.     5. Has a general doctor at [facility]    RTC in 6 months.     In total, 30 minutes of time was spent on this encounter, greater than 50% of which was spent face-to-face with Ricardo Foley for the history, physical exam, assessment and formulation of treatment plan.    This document serves as  a record of services personally performed by Ivor Reining, MD. It was created on their behalf by Gerarda Fraction, Medical Scribe, a trained medical scribe. The creation of this record is the provider's dictation and/or activities during the visit.     Electronically signed by: Gerarda Fraction, Medical Scribe 03/23/2021 10:12 AM            Electronically signed by: Nicanor Bake, MD  03/23/21 1121

## 2022-06-08 ENCOUNTER — Encounter

## 2022-07-12 ENCOUNTER — Ambulatory Visit: Payer: PRIVATE HEALTH INSURANCE | Primary: Family

## 2022-07-12 ENCOUNTER — Inpatient Hospital Stay: Admit: 2022-07-12 | Payer: PRIVATE HEALTH INSURANCE | Primary: Family

## 2022-07-12 DIAGNOSIS — M47812 Spondylosis without myelopathy or radiculopathy, cervical region: Secondary | ICD-10-CM

## 2022-07-12 DIAGNOSIS — Z136 Encounter for screening for cardiovascular disorders: Secondary | ICD-10-CM

## 2022-07-12 DIAGNOSIS — M47816 Spondylosis without myelopathy or radiculopathy, lumbar region: Secondary | ICD-10-CM

## 2022-07-12 LAB — CBC WITH AUTO DIFFERENTIAL
Basophils %: 0.5 % (ref 0–1)
Basophils Absolute: 0.1 10*3/uL
Eosinophils %: 6 % — ABNORMAL HIGH (ref 0–3)
Eosinophils Absolute: 1.4 10*3/uL
Hematocrit: 35.4 % — ABNORMAL LOW (ref 42–52)
Hemoglobin: 11.2 GM/DL — ABNORMAL LOW (ref 13.5–18.0)
Immature Neutrophil %: 0.5 % — ABNORMAL HIGH (ref 0–0.43)
Lymphocytes %: 30.2 % (ref 24–44)
Lymphocytes Absolute: 7.2 10*3/uL
MCH: 27.9 PG (ref 27–31)
MCHC: 31.6 % — ABNORMAL LOW (ref 32.0–36.0)
MCV: 88.3 FL (ref 78–100)
MPV: 9.7 FL (ref 7.5–11.1)
Monocytes %: 4.8 % — ABNORMAL HIGH (ref 0–4)
Monocytes Absolute: 1.2 10*3/uL
Neutrophils %: 58 % (ref 36–66)
Neutrophils Absolute: 13.8 10*3/uL
Nucleated RBC %: 0 %
Platelets: 343 10*3/uL (ref 140–440)
RBC: 4.01 10*6/uL — ABNORMAL LOW (ref 4.6–6.2)
RDW: 17.6 % — ABNORMAL HIGH (ref 11.7–14.9)
Total Immature Neutrophil: 0.12 10*3/uL
Total Nucleated RBC: 0 10*3/uL
WBC: 23.8 10*3/uL — ABNORMAL HIGH (ref 4.0–10.5)

## 2022-07-12 LAB — LIPID, FASTING
Cholesterol, Fasting: 120 MG/DL (ref <200)
HDL: 56 MG/DL (ref >40)
LDL Cholesterol: 42 MG/DL (ref <100)
Triglyceride, Fasting: 112 MG/DL (ref <150)

## 2022-07-12 LAB — COMPREHENSIVE METABOLIC PANEL
ALT: 12 U/L (ref 10–40)
AST: 12 IU/L — ABNORMAL LOW (ref 15–37)
Albumin: 4.5 GM/DL (ref 3.4–5.0)
Alkaline Phosphatase: 64 IU/L (ref 40–128)
Anion Gap: 11 (ref 7–16)
BUN: 14 MG/DL (ref 6–23)
CO2: 27 MMOL/L (ref 21–32)
Calcium: 8.8 MG/DL (ref 8.3–10.6)
Chloride: 97 mMol/L — ABNORMAL LOW (ref 99–110)
Creatinine: 1.3 MG/DL (ref 0.9–1.3)
Est, Glom Filt Rate: 63 mL/min/{1.73_m2} (ref >60)
Glucose: 121 MG/DL — ABNORMAL HIGH (ref 70–99)
Potassium: 4.4 MMOL/L (ref 3.5–5.1)
Sodium: 135 MMOL/L (ref 135–145)
Total Bilirubin: 0.3 MG/DL (ref 0.0–1.0)
Total Protein: 6.8 GM/DL (ref 6.4–8.2)

## 2022-07-12 LAB — HEMOGLOBIN A1C
Estimated Avg Glucose: 137 mg/dL
Hemoglobin A1C: 6.4 % — ABNORMAL HIGH (ref 4.2–6.3)

## 2022-07-12 LAB — TSH: TSH, High Sensitivity: 2.07 u[IU]/mL (ref 0.270–4.20)

## 2022-07-15 LAB — PSA, TOTAL AND FREE
PSA, Free Pct: 23 %
PSA, Free: 0.3 ng/mL
PSA: 1.3 ng/mL (ref 0.0–4.0)

## 2022-07-20 ENCOUNTER — Emergency Department: Admit: 2022-07-20 | Payer: PRIVATE HEALTH INSURANCE | Primary: Family

## 2022-07-20 ENCOUNTER — Inpatient Hospital Stay
Admission: EM | Admit: 2022-07-20 | Discharge: 2022-07-27 | Disposition: A | Discharge status: Home / Self Care | Payer: PRIVATE HEALTH INSURANCE | Admitting: Student in an Organized Health Care Education/Training Program

## 2022-07-20 DIAGNOSIS — M4802 Spinal stenosis, cervical region: Secondary | ICD-10-CM

## 2022-07-20 DIAGNOSIS — M542 Cervicalgia: Secondary | ICD-10-CM

## 2022-07-20 LAB — COMPREHENSIVE METABOLIC PANEL
ALT: 11 U/L (ref 10–40)
AST: 11 IU/L — ABNORMAL LOW (ref 15–37)
Albumin: 3.9 GM/DL (ref 3.4–5.0)
Alkaline Phosphatase: 60 IU/L (ref 40–128)
Anion Gap: 10 (ref 7–16)
BUN: 20 MG/DL (ref 6–23)
CO2: 24 MMOL/L (ref 21–32)
Calcium: 8.7 MG/DL (ref 8.3–10.6)
Chloride: 102 mMol/L (ref 99–110)
Creatinine: 1.4 MG/DL — ABNORMAL HIGH (ref 0.9–1.3)
Est, Glom Filt Rate: 58 mL/min/{1.73_m2} — ABNORMAL LOW (ref >60)
Glucose: 88 MG/DL (ref 70–99)
Potassium: 5.4 MMOL/L — ABNORMAL HIGH (ref 3.5–5.1)
Sodium: 136 MMOL/L (ref 135–145)
Total Bilirubin: 0.2 MG/DL (ref 0.0–1.0)
Total Protein: 6.8 GM/DL (ref 6.4–8.2)

## 2022-07-20 LAB — CBC WITH AUTO DIFFERENTIAL
Eosinophils %: 14 % — ABNORMAL HIGH (ref 0–3)
Eosinophils Absolute: 1.9 10*3/uL
Hematocrit: 34.5 % — ABNORMAL LOW (ref 42–52)
Hemoglobin: 11 GM/DL — ABNORMAL LOW (ref 13.5–18.0)
Lymphocytes %: 47 % — ABNORMAL HIGH (ref 24–44)
Lymphocytes Absolute: 6.3 10*3/uL
MCH: 28.3 PG (ref 27–31)
MCHC: 31.9 % — ABNORMAL LOW (ref 32.0–36.0)
MCV: 88.7 FL (ref 78–100)
MPV: 9.4 FL (ref 7.5–11.1)
Monocytes %: 7 % — ABNORMAL HIGH (ref 0–4)
Monocytes Absolute: 0.9 10*3/uL
Neutrophils %: 32 % — ABNORMAL LOW (ref 36–66)
Neutrophils Absolute: 4.3 10*3/uL
Platelets: 307 10*3/uL (ref 140–440)
RBC: 3.89 10*6/uL — ABNORMAL LOW (ref 4.6–6.2)
RDW: 18.1 % — ABNORMAL HIGH (ref 11.7–14.9)
WBC: 13.4 10*3/uL — ABNORMAL HIGH (ref 4.0–10.5)

## 2022-07-20 LAB — HEMOGLOBIN A1C
Estimated Avg Glucose: 137 mg/dL
Hemoglobin A1C: 6.4 % — ABNORMAL HIGH (ref 4.2–6.3)

## 2022-07-20 LAB — PROTIME/INR & PTT
INR: 1 INDEX
Protime: 13.1 SECONDS (ref 11.7–14.5)
aPTT: 28.4 SECONDS (ref 25.1–37.1)

## 2022-07-20 LAB — D-DIMER, RAPID: D-Dimer, Quant: 0.53 ug/mL (FEU) — ABNORMAL HIGH (ref <0.47)

## 2022-07-20 LAB — MAGNESIUM: Magnesium: 1.4 mg/dl — ABNORMAL LOW (ref 1.8–2.4)

## 2022-07-20 LAB — TROPONIN
Troponin, High Sensitivity: 34 ng/L — ABNORMAL HIGH (ref 0–22)
Troponin, High Sensitivity: 30 ng/L — ABNORMAL HIGH (ref 0–22)

## 2022-07-20 LAB — LIPASE: Lipase: 152 IU/L — ABNORMAL HIGH (ref 13–60)

## 2022-07-20 MED ORDER — SODIUM CHLORIDE 0.9 % IV SOLN
0.9 | INTRAVENOUS | Status: DC
Start: 2022-07-20 — End: 2022-07-20
  Administered 2022-07-20: 23:00:00 via INTRAVENOUS

## 2022-07-20 MED ORDER — MAGNESIUM SULFATE 2000 MG/50 ML IVPB PREMIX
2 | Freq: Once | INTRAVENOUS | Status: AC
Start: 2022-07-20 — End: 2022-07-20
  Administered 2022-07-20: 23:00:00 2000 mg via INTRAVENOUS

## 2022-07-20 MED ORDER — SODIUM CHLORIDE 0.9 % IV BOLUS
0.9 | Freq: Once | INTRAVENOUS | Status: AC
Start: 2022-07-20 — End: 2022-07-20
  Administered 2022-07-20: 21:00:00 1000 mL via INTRAVENOUS

## 2022-07-20 MED ORDER — FENTANYL CITRATE (PF) 100 MCG/2ML IJ SOLN
100 | Freq: Once | INTRAMUSCULAR | Status: AC
Start: 2022-07-20 — End: 2022-07-20
  Administered 2022-07-20: 23:00:00 25 ug via INTRAVENOUS

## 2022-07-20 MED ORDER — IOPAMIDOL 76 % IV SOLN
76 | Freq: Once | INTRAVENOUS | Status: AC | PRN
Start: 2022-07-20 — End: 2022-07-20
  Administered 2022-07-20: 22:00:00 80 mL via INTRAVENOUS

## 2022-07-20 MED FILL — SODIUM CHLORIDE 0.9 % IV SOLN: 0.9 % | INTRAVENOUS | Qty: 1000

## 2022-07-20 MED FILL — FENTANYL CITRATE (PF) 100 MCG/2ML IJ SOLN: 100 MCG/2ML | INTRAMUSCULAR | Qty: 2

## 2022-07-20 MED FILL — MAGNESIUM SULFATE 2 GM/50ML IV SOLN: 2 GM/50ML | INTRAVENOUS | Qty: 50

## 2022-07-20 MED FILL — ISOVUE-370 76 % IV SOLN: 76 % | INTRAVENOUS | Qty: 100

## 2022-07-20 NOTE — ED Notes (Signed)
Unable to place pt on cardiac monitor as he is currently going to MRI

## 2022-07-20 NOTE — ED Provider Notes (Addendum)
I Dr. [name] D.O. am the primary physician of record. I personally saw the patient and made/approved the management plan and take responsibility for the patient management. I independently examined and evaluated Ricardo Foley.    In brief their history revealed a 62 y.o.  male who presents to the ED via private vehicle prompted by orthopedic spine surgery for the potential of admission for ongoing back pain, recent abnormal imaging.  Patient found to be hypotensive on initial evaluation as well.  Patient is a poor historian,- states that he has had chronic issues with upper back,/neck pain. Has been in pain management receiving therapeutic injections who ultimately referred him to orthopedic spine.  States he has waves of pain and an intermittent episodes of lightheadedness and dizziness.  Patient denying any active chest pain.  Pain does admit that he has ongoing abdominal pain/issues, he locates it into the upper abdomen, denies change in bowel habits.  No recent fevers or chills.  No other infectious symptoms, cough congestion, painful urination, frequency.  No numbness and tingling to the bilateral upper extremity, no grip strength weakness.  Patient denies history of low blood pressure does have history of cardiac issues, smoker has hypertension hyperlipidemia, also has history of pancreatitis.     Their focused exam revealed alert oriented male resting in bed no distress normocephalic atraumatic sclera clear lungs clear heart bradycardic regular rhythm 2+ pulses throughout abdomen soft nontender diffusely no rebound guarding or rigidity no pulsatile mass no obvious hernia.  Moving all extremities, 5-5 strength, no numbness, does have TC and L-spine pain with palpation no signs of trauma..    ED course/MDM:  Patient seen with PA please see his note.  Patient complaint of neck pain and low blood pressure lightheaded.  He was seen at spine surgery's office today sent over for evaluation and  admission, is supposed to have surgery while he is here.  Patient has multiple comorbidities, recently got back from [place].  He complains of neck pain back pain lightheaded abdominal pain, PA saw patient initially had broad workup performed we did do CT of the chest abdomen pelvis which was negative for acute problem.  He had an MRI C-spine few days ago and had severe stenosis MRI thoracic spine today was negative, he was bradycardic and hypotensive on arrival did talk to, PA did talk to surgeon, did not think he had neurogenic shock he appears otherwise nontoxic and septic.  Vital signs otherwise are stable he was given IV fluids, and otherwise workup unremarkable did have some lab abnormalities.  I did talk to surgeon again would like patient admitted to medicine he will likely have surgery while he is here but currently no distress patient okay with plan, admitted.  He has no bowel bladder incontinence no saddle anesthesia denies any urine or bowel complaints otherwise I did talk to hospital medicine    All diagnostic, treatment, and disposition decisions were made by myself in conjunction with the Advanced Practice Provider.    For all further details of the patient's emergency department visit, please see the Advanced Practice Provider's documentation.        12 lead EKG per my interpretation:  Normal Sinus Rhythm 60  Axis is   Normal  QTc is   438  There is no specific T wave changes appreciated.  There is no specific ST wave changes appreciated.    Prior EKG to compare with was not available        Ricardo Foley,  Ricardo Fearing, DO  07/20/22 1704       Ricardo Mediate, DO  07/20/22 1956

## 2022-07-20 NOTE — H&P (Signed)
History and Physical      Name:  Ricardo Foley DOB/Age/Sex: Mar 07, 1960  (62 y.o. male)   MRN & CSN:  [id number] & [id number] Encounter Date/Time: 07/20/2022 8:11 PM   Location:  [facility] PCP: Oval Linsey, APRN - CNP       Hospital Day: 1    Assessment and Plan:     Patient is a 62 year old male who presented with neck pain.     # Cervical myelopathy   - Endorsed neck pain with bilateral arm (L>R) and leg numbness. Ambulates with cane, but unsteady gait. No falls or recent injury/trauma. No saddle anesthesia or B/B incontinence. Recent MRI C/L-spine showed severe central canal stenosis of C3-C4 and C4-C5 with DDD and spondylosis. Sent from NSGY clinic for potential decompression.   - MRI T-spine nonacute. CTA nonacute.   - Plan for surgical intervention on 7/2. Continue holding Plavix. Supportive care.     # AKI, mild  - Clinically hypovolemic. Cr 1.4, baseline ~ 0.8. UA negative.  - Will challenge with IVF. Strict I/O's. Bladder scan if decreased voiding. Avoid NSAIDs.    # CAD  # Non-MI troponin elevation  # PVD  - Denied any typical CP. LHC in 07/2017 showed severe dLCx disease, not amenable to PCI.   - Initial Tn mildly elevated, repeat flat. ECG without acute ischemic changes.  - Likely secondary to decreased renal clearance. Hold ASA and Plavix for surgery, continue Lipitor.     # Essential hypertension  - Hold Coreg in setting of borderline hypotension.    # Hyperkalemia  - Mild, <5.6.  - Follow-up repeat labs.    # Hypomagnesemia  - Mild, repleted.  - Follow-up repeat labs.    # Leukocytosis  - Chronic, no fevers, LA normal.   - Monitor labs.     # Elevated lipase  - Denied any typical abdominal pain.   - CT non-acute.   - Not consistent with pancreatitis.     # T2DM with hyperglycemia  - Last A1c 6.4% on admission.   - Held Metformin.  - LCSI.    # Tobacco abuse  - Hx of smoking 1.5 PPD for over 40 years.  - Advised on importance of complete cessation.  - Nicotine patches  prn.    Checklist:  Advanced care planning: full  Diet: cardiac  DVT ppx: heparin   Sugar: BG goal of 140-180 while inpatient    Disposition: admit to inpatient.  Estimated discharge: 5-6 day(s).  Current living situation: home.  Expected disposition: TBD.    Spoke with ED provider who recommended admission for the patient and I agree with that plan.  Personally reviewed lab studies and imaging.  EKG interpreted personally and results as stated above.  Imaging that was interpreted personally and results as stated above.    History of Present Illness:     Chief Complaint: neck pain    Patient is a 62 year old male with a PMHx of CAD, PVD, HTN, HLD, T2DM, chronic neck pain and tobacco abuse who presented to the ED with neck pain with bilateral arm (L>R) and leg numbness. Ambulates with cane, but unsteady gait. No falls or recent injury/trauma. No saddle anesthesia or B/B incontinence. Sent from NSGY clinic for potential decompression. Denied any fevers, chills, CP, SOB, cough, N/V, abdominal pain, C/D or urinary changes. Smokes 1.5 PPD for over 40 years.      History obtained from: patient and ED provider.    ROS:  Pertinent positives and negatives discussed in HPI above.    Objective:     No intake or output data in the 24 hours ending 07/20/22 2011     Vitals:   Vitals:    07/20/22 1900 07/20/22 1942 07/20/22 1945 07/20/22 1947   BP: (!) 127/59  110/64    Pulse: 62 57 57    Resp: 13 13 17 17    Temp:       TempSrc:       SpO2: 94% 98% 98% 98%     BMI: There is no height or weight on file to calculate BMI.  General: Awake.     HEENT: PERRLA. Vision grossly intact. Normal hearing. Oropharynx clear.   Neck: Supple. No JVD.   CV: RRR. NL S1/S2. No BLE edema.   Pulm: NL effort on RA. CTAB.   GI: +BS x4. Soft. NT/ND.  GU: No CVA tenderness. No Foley catheter.  Skin: Intact, warm and dry.  MSK: No gross joint deformities. Full ROM.   Neuro: AAOx3. CNs grossly intact. Normal speech. No focal deficit. 5/5 strength. Right  hand clonus. Normal sensation.   Psych: Good judgement and reason.     Past History:     PMHx:   Past Medical History:   Diagnosis Date    CAD (coronary artery disease)     Chronic back pain     Had shots August 2019    Diabetes [facility])     Diagnosed about 2009    H/O cardiac catheterization 08/02/2017     severe single vessel CAD of the distal LCX which is small caliber and not amenable to PCI.    H/O cardiovascular stress test 07/03/2017    scan shows moderate size, severe intensity, non reversible perfusion defect in inferolateral wall, normal LVEF.    H/O Doppler lower arterial ultrasound 07/11/2017    The Right Proximal and mid SFA exhibits an occlusion. The Left SFA exhibits an occlusion between the mid and distal portions. Right ABIs show Severe peripheral arterial disease.    H/O echocardiogram 05/30/2017    Left ventricular systolic function is normal. Ejection fraction is visually estimated at >55%.    Hyperlipidemia     Hypertension     IBS (irritable bowel syndrome)     Nerve pain     Pancreatitis     PVD (peripheral vascular disease) (HCC) 08/02/2017     Severe lower extremity PAD. See abn Peripherial Angio. ( ABN Arterial US.07/11/17)    Scoliosis        PSHx:   Past Surgical History:   Procedure Laterality Date    CHG US GUIDANCE NEEDLE PLACEMENT IMG S&I         Allergies: No Known Allergies    FHx: family history includes Heart Attack in his brother; Stroke in his sister.    SHx:   Social History     Socioeconomic History    Marital status: Single     Spouse name: None    Number of children: None    Years of education: None    Highest education level: None   Tobacco Use    Smoking status: Every Day     Current packs/day: 1.00     Average packs/day: 1 pack/day for 50.5 years (50.5 ttl pk-yrs)     Types: Cigarettes     Start date: 66    Smokeless tobacco: Never   Vaping Use    Vaping Use: Never used   Substance and Sexual  Activity    Alcohol use: No     Comment: caffeine 1 cup of coffee a day    Drug  use: Yes     Frequency: 14.0 times per week     Types: Marijuana (Weed)       Medications Prior to Admission     Prior to Admission medications    Medication Sig Start Date End Date Taking? Authorizing Provider   oxyCODONE-acetaminophen (PERCOCET) 7.5-325 MG per tablet Take 1 tablet by mouth every 6 hours as needed for Pain.    [provider]   ondansetron (ZOFRAN) 4 MG tablet Take 1 tablet by mouth daily as needed for Nausea or Vomiting 05/10/19   Iran Ouch, MD   carvedilol (COREG) 12.5 MG tablet Take 12.5 mg by mouth 2 times daily 10/01/18   [provider]   FEROSUL 325 (65 Fe) MG tablet Take 325 mg by mouth daily 10/07/18   [provider]   lactobacillus (CULTURELLE) CAPS capsule Take 1 capsule by mouth daily 10/01/18   [provider]   LINZESS 145 MCG capsule Take 145 mcg by mouth daily 09/05/18   [provider]   clopidogrel (PLAVIX) 75 MG tablet Take 75 mg by mouth daily    [provider]   dicyclomine (BENTYL) 10 MG capsule Take 1 capsule by mouth every 6 hours as needed (Bowel spasms) 03/18/18 11/07/18  Alveria Apley B, DO   aspirin 81 MG chewable tablet Take 1 tablet by mouth daily 03/05/18   Laurita Quint, APRN - CNP   DULoxetine (CYMBALTA) 60 MG extended release capsule Take 60 mg by mouth daily    [provider]   gabapentin (NEURONTIN) 400 MG capsule Take 400 mg by mouth 3 times daily.    [provider]   pantoprazole (PROTONIX) 40 MG tablet Take 40 mg by mouth 2 times daily    [provider]   B Complex-C-Folic Acid (DIALYVITE TABLET) TABS Take 1 tablet by mouth daily    [provider]   tamsulosin (FLOMAX) 0.4 MG capsule Take 0.4 mg by mouth daily    [provider]   albuterol sulfate HFA 108 (90 Base) MCG/ACT inhaler Inhale 2 puffs into the lungs every 6 hours as needed for Wheezing    [provider]   tiZANidine (ZANAFLEX) 4 MG tablet Take 4 mg by mouth every 8 hours as needed      [provider]   lisinopril (PRINIVIL;ZESTRIL) 20 MG tablet Take 20 mg by mouth daily  Patient not taking: Reported on 07/20/2022    [provider]   metFORMIN (GLUCOPHAGE) 1000 MG tablet Take 1,000 mg by mouth 2 times daily (with meals)    [provider]   hydrOXYzine (VISTARIL) 50 MG capsule Take 50 mg by mouth 3 times daily as needed for Itching    [provider]   atorvastatin (LIPITOR) 40 MG tablet Take 40 mg by mouth nightly     [provider]       Medications:     Medications:    magnesium sulfate  2,000 mg IntraVENous Once        Infusions:    sodium chloride 100 mL/hr at 07/20/22 1909       PRN Meds:        Data:     CBC:   Recent Labs     07/20/22  1639   WBC 13.4*   HGB 11.0*   PLT  307   MCV 88.7   RDW 18.1*   LYMPHOPCT 47.0*   MONOPCT 7.0*   EOSPCT 14.0*   MONOSABS 0.9   EOSABS 1.9     CMP:    Recent Labs     07/20/22  1639   NA 136   K 5.4*   CL 102   CO2 24   BUN 20   CREATININE 1.4*   GLUCOSE 88   CALCIUM 8.7   BILITOT 0.2   ALKPHOS 60   AST 11*   ALT 11     Lipids:   Lab Results   Component Value Date/Time    CHOL 90 06/11/2018 02:19 AM    HDL 56 07/12/2022 04:07 PM    TRIG 158 06/11/2018 02:19 AM     Hemoglobin A1C:   Lab Results   Component Value Date/Time    LABA1C 6.4 07/20/2022 04:39 PM     TSH:   Lab Results   Component Value Date/Time    TSH 1.42 03/12/2017 03:00 PM     Troponin:   Lab Results   Component Value Date/Time    TROPONINT <0.010 11/07/2018 10:19 AM    TROPONINT <0.010 03/18/2018 05:14 PM    TROPONINT <0.010 09/08/2017 06:43 PM     BNP: No results for input(s): "PROBNP" in the last 72 hours.  Lactic Acid: No results for input(s): "LACTA" in the last 72 hours.  UA:  Lab Results   Component Value Date/Time    NITRU NEGATIVE 05/10/2019 01:46 PM    COLORU YELLOW 05/10/2019 01:46 PM    PHUR 5.0 05/10/2019 01:46 PM    WBCUA NO CELLS SEEN 05/10/2019 01:46 PM    RBCUA 6 05/10/2019 01:46 PM    MUCUS RARE 06/10/2018 06:50 PM    TRICHOMONAS  NONE SEEN 06/10/2018 06:50 PM    BACTERIA NEGATIVE 05/10/2019 01:46 PM    CLARITYU CLEAR 05/10/2019 01:46 PM    LEUKOCYTESUR NEGATIVE 05/10/2019 01:46 PM    UROBILINOGEN 0.2 05/10/2019 01:46 PM    BILIRUBINUR NEGATIVE 05/10/2019 01:46 PM    BLOODU SMALL 05/10/2019 01:46 PM    GLUCOSEU NEGATIVE 05/10/2019 01:46 PM    KETUA NEGATIVE 05/10/2019 01:46 PM     Urine Cultures: No results found for: "LABURIN"  Blood Cultures: No results found for: "BC"  No results found for: "BLOODCULT2"  Organism: No results found for: "ORG"    Radiology results:  CTA CHEST ABDOMEN PELVIS W CONTRAST   Final Result      No aortic dissection or aneurysm.         Electronically signed by Alycia Rossetti      MRI THORACIC SPINE WO CONTRAST   Final Result      No acute findings in the thoracic spine.      Electronically signed by Alycia Rossetti      XR CHEST PORTABLE   Final Result   No acute findings in the chest         Electronically signed by Dennison Bulla, MD  07/20/22 8:11 PM

## 2022-07-20 NOTE — ED Provider Notes (Signed)
EMERGENCY DEPARTMENT ENCOUNTER        Pt Name: Ricardo Foley  MRN: [id number]  Birthdate [birth year]  Date of evaluation: 07/20/2022  Provider: Hervey Ard, PA  PCP: Oval Linsey, APRN - CNP     I have seen and evaluated this patient with my supervising physician Fayne Mediate, DO.      Triage CHIEF COMPLAINT       Chief Complaint   Patient presents with    Neck Pain     States he needs emergency surgery for pain that he has had for 20 years, sent by Dr. Luther Redo         HISTORY OF PRESENT ILLNESS      Chief Complaint: Upper back pain, hypotension-sent in by orthopedics spine for surgery    Ricardo Foley is a 62 y.o.  male who presents to the ED via private vehicle prompted by orthopedic spine surgery for the potential of admission for ongoing back pain, recent abnormal imaging.  Patient found to be hypotensive on initial evaluation as well.  Patient is a poor historian,- states that he has had chronic issues with upper back,/neck pain. Has been in pain management receiving therapeutic injections who ultimately referred him to orthopedic spine.  States he has waves of pain and an intermittent episodes of lightheadedness and dizziness.  Patient denying any active chest pain.  Pain does admit that he has ongoing abdominal pain/issues, he locates it into the upper abdomen, denies change in bowel habits.  No recent fevers or chills.  No other infectious symptoms, cough congestion, painful urination, frequency.  No numbness and tingling to the bilateral upper extremity, no grip strength weakness.  Patient denies history of low blood pressure does have history of cardiac issues, smoker has hypertension hyperlipidemia, also has history of pancreatitis.    Nursing Notes were all reviewed and agreed with or any disagreements were addressed in the HPI.    REVIEW OF SYSTEMS     At least 10 systems reviewed and otherwise acutely negative except as in the HOPI.     PAST MEDICAL HISTORY     Past Medical  History:   Diagnosis Date    CAD (coronary artery disease)     Chronic back pain     Had shots August 2019    Diabetes [facility])     Diagnosed about 2009    H/O cardiac catheterization 08/02/2017     severe single vessel CAD of the distal LCX which is small caliber and not amenable to PCI.    H/O cardiovascular stress test 07/03/2017    scan shows moderate size, severe intensity, non reversible perfusion defect in inferolateral wall, normal LVEF.    H/O Doppler lower arterial ultrasound 07/11/2017    The Right Proximal and mid SFA exhibits an occlusion. The Left SFA exhibits an occlusion between the mid and distal portions. Right ABIs show Severe peripheral arterial disease.    H/O echocardiogram 05/30/2017    Left ventricular systolic function is normal. Ejection fraction is visually estimated at >55%.    Hyperlipidemia     Hypertension     IBS (irritable bowel syndrome)     Nerve pain     Pancreatitis     PVD (peripheral vascular disease) (HCC) 08/02/2017     Severe lower extremity PAD. See abn Peripherial Angio. ( ABN Arterial US.07/11/17)    Scoliosis        SURGICAL HISTORY     Past Surgical History:  Procedure Laterality Date    CHG US GUIDANCE NEEDLE PLACEMENT IMG S&I         CURRENTMEDICATIONS       Current Discharge Medication List        CONTINUE these medications which have NOT CHANGED    Details   traZODone (DESYREL) 150 MG tablet Take 1 tablet by mouth nightly      oxyCODONE-acetaminophen (PERCOCET) 7.5-325 MG per tablet Take 1 tablet by mouth every 6 hours as needed for Pain.      ondansetron (ZOFRAN) 4 MG tablet Take 1 tablet by mouth daily as needed for Nausea or Vomiting  Qty: 30 tablet, Refills: 0      carvedilol (COREG) 12.5 MG tablet Take 1 tablet by mouth 2 times daily      FEROSUL 325 (65 Fe) MG tablet Take 325 mg by mouth daily      lactobacillus (CULTURELLE) CAPS capsule Take 1 capsule by mouth daily      LINZESS 145 MCG capsule Take 145 mcg by mouth daily      clopidogrel (PLAVIX) 75 MG tablet  Take 75 mg by mouth daily      dicyclomine (BENTYL) 10 MG capsule Take 1 capsule by mouth every 6 hours as needed (Bowel spasms)  Qty: 20 capsule, Refills: 0      aspirin 81 MG chewable tablet Take 1 tablet by mouth daily  Qty: 90 tablet, Refills: 3      DULoxetine (CYMBALTA) 60 MG extended release capsule Take 1 capsule by mouth daily      gabapentin (NEURONTIN) 400 MG capsule Take 1 capsule by mouth 3 times daily.      pantoprazole (PROTONIX) 40 MG tablet Take 1 tablet by mouth 2 times daily      B Complex-C-Folic Acid (DIALYVITE TABLET) TABS Take 1 tablet by mouth daily      tamsulosin (FLOMAX) 0.4 MG capsule Take 1 capsule by mouth daily      albuterol sulfate HFA 108 (90 Base) MCG/ACT inhaler Inhale 2 puffs into the lungs every 6 hours as needed for Wheezing      tiZANidine (ZANAFLEX) 4 MG tablet Take 1 tablet by mouth every 8 hours as needed      lisinopril (PRINIVIL;ZESTRIL) 20 MG tablet Take 20 mg by mouth daily      metFORMIN (GLUCOPHAGE) 1000 MG tablet Take 1 tablet by mouth 2 times daily (with meals)      hydrOXYzine (VISTARIL) 50 MG capsule Take 1 capsule by mouth 3 times daily as needed for Itching      atorvastatin (LIPITOR) 40 MG tablet Take 1 tablet by mouth nightly             ALLERGIES     Patient has no known allergies.    FAMILYHISTORY       Family History   Problem Relation Age of Onset    Stroke Sister     Heart Attack Brother         SOCIAL HISTORY       Social History     Socioeconomic History    Marital status: Single     Spouse name: None    Number of children: None    Years of education: None    Highest education level: None   Tobacco Use    Smoking status: Every Day     Current packs/day: 1.00     Average packs/day: 1 pack/day for 50.5 years (50.5 ttl pk-yrs)  Types: Cigarettes     Start date: 1974    Smokeless tobacco: Never   Vaping Use    Vaping Use: Never used   Substance and Sexual Activity    Alcohol use: No     Comment: caffeine 1 cup of coffee a day    Drug use: Yes      Frequency: 14.0 times per week     Types: Marijuana Sheran Fava)     Social Determinants of Health     Food Insecurity: No Food Insecurity (07/20/2022)    Hunger Vital Sign     Worried About Running Out of Food in the Last Year: Never true     Ran Out of Food in the Last Year: Never true   Transportation Needs: No Transportation Needs (07/20/2022)    PRAPARE - Therapist, art (Medical): No     Lack of Transportation (Non-Medical): No   Housing Stability: Low Risk  (07/20/2022)    Housing Stability Vital Sign     Unable to Pay for Housing in the Last Year: No     Number of Places Lived in the Last Year: 1     Unstable Housing in the Last Year: No       SCREENINGS    Glasgow Coma Scale  Eye Opening: Spontaneous  Best Verbal Response: Oriented  Best Motor Response: Obeys commands  Glasgow Coma Scale Score: 15      PHYSICAL EXAM       ED Triage Vitals [07/20/22 1549]   BP Temp Temp Source Pulse Respirations SpO2 Height Weight   (!) 81/51 98.2 F (36.8 C) Oral 59 16 97 % -- --      Constitutional:  Well developed, Well nourished.  No distress  HENT:  Normocephalic, Atraumatic, PERRL.  EOMI.  Sclera clear.Conjunctiva normal, No discharge.   Oropharynx is within normal limits, no tonsillar hypertrophy white exudate, tolerating secretions.  Bilateral TMs are pearly white without signs of significant erythema or effusion.  No perforation. No mastoid tenderness.  Neck/Lymphatics:  supple, no JVD, no swollen nodes  Cardiovascular:  RRR,  no murmurs/rubs/gallops.  Respiratory:   Nonlabored breathing.  Normal breath sounds, No wheezing  Abdomen:  Bowel sounds normal, epigastric tenderness to of the abdomen, no obvious pulsatile masses.  GU:  No significant flank tenderness to percussion.  Musculoskeletal:    BACK: There is not thoracic or lumbar midline tenderness to palpation or step-offs. Paraspinal tenderness to palpation is  present in the mid parathoracic region. No overlying rashes. LE strength is 5/5. LE  light touch is intact. LE DTR's are 2+ in the patellas and achilles. Straight leg test is negative on the RIGHT, no on the LEFT.   Distal cap refill and pulses intact bilateral upper and lower extremities  Bilateral upper and lower extremity ROM intact without pain or obvious deficit  Integument:   Warm, Dry  Neurologic:  Alert & oriented , No focal deficits noted.   Cranial nerves II through XII grossly intact.   Normal gross motor coordination & motor strength bilateral upper and lower extremities  Sensation intact.  Psychiatric:  Affect normal, Mood normal.     DIAGNOSTIC RESULTS   LABS:    Labs Reviewed   CBC WITH AUTO DIFFERENTIAL - Abnormal; Notable for the following components:       Result Value    WBC 13.4 (*)     RBC 3.89 (*)     Hemoglobin 11.0 (*)  Hematocrit 34.5 (*)     MCHC 31.9 (*)     RDW 18.1 (*)     Neutrophils % 32.0 (*)     Eosinophils % 14.0 (*)     Lymphocytes % 47.0 (*)     Monocytes % 7.0 (*)     All other components within normal limits   COMPREHENSIVE METABOLIC PANEL - Abnormal; Notable for the following components:    Potassium 5.4 (*)     Creatinine 1.4 (*)     Est, Glom Filt Rate 58 (*)     AST 11 (*)     All other components within normal limits   TROPONIN - Abnormal; Notable for the following components:    Troponin, High Sensitivity 34 (*)     All other components within normal limits   TROPONIN - Abnormal; Notable for the following components:    Troponin, High Sensitivity 30 (*)     All other components within normal limits   LIPASE - Abnormal; Notable for the following components:    Lipase 152 (*)     All other components within normal limits   D-DIMER, RAPID - Abnormal; Notable for the following components:    D-Dimer, Quant 0.53 (*)     All other components within normal limits   MAGNESIUM - Abnormal; Notable for the following components:    Magnesium 1.4 (*)     All other components within normal limits   HEMOGLOBIN A1C - Abnormal; Notable for the following components:     Hemoglobin A1C 6.4 (*)     All other components within normal limits   BASIC METABOLIC PANEL W/ REFLEX TO MG FOR LOW K - Abnormal; Notable for the following components:    Glucose 104 (*)     All other components within normal limits   CBC WITH AUTO DIFFERENTIAL - Abnormal; Notable for the following components:    WBC 11.9 (*)     RBC 3.57 (*)     Hemoglobin 10.2 (*)     Hematocrit 31.4 (*)     RDW 18.1 (*)     Neutrophils % 32.0 (*)     Lymphocytes % 49.1 (*)     Monocytes % 5.2 (*)     Eosinophils % 12.3 (*)     Immature Neutrophil % 0.7 (*)     All other components within normal limits   POCT GLUCOSE - Abnormal; Notable for the following components:    POC Glucose 120 (*)     All other components within normal limits   POCT GLUCOSE - Abnormal; Notable for the following components:    POC Glucose 152 (*)     All other components within normal limits   POCT GLUCOSE - Abnormal; Notable for the following components:    POC Glucose 145 (*)     All other components within normal limits   POCT GLUCOSE - Abnormal; Notable for the following components:    POC Glucose 146 (*)     All other components within normal limits   POCT GLUCOSE - Abnormal; Notable for the following components:    POC Glucose 131 (*)     All other components within normal limits   POCT GLUCOSE - Abnormal; Notable for the following components:    POC Glucose 141 (*)     All other components within normal limits   URINALYSIS   PROTIME/INR & PTT   LACTATE, SEPSIS   PROTIME-INR   TSH WITH REFLEX   LACTATE,  SEPSIS   POCT GLUCOSE   POCT GLUCOSE   POCT GLUCOSE   POCT GLUCOSE   POCT GLUCOSE   POCT GLUCOSE   POCT GLUCOSE       When ordered, only abnormal lab results are displayed. All other labs were within normal range or not returned as of this dictation.    EKG: When ordered, EKG's are interpreted by the Emergency Department Physician in the absence of a cardiologist.  Please see their note for interpretation of EKG.    RADIOLOGY:   Non-plain film images  such as CT, Ultrasound and MRI are read by the radiologist. Plain radiographic images are visualized and preliminarily interpreted by the  ED Provider with the below findings:    Interpretation perthe Radiologist below, if available at the time of this note:    CTA CHEST ABDOMEN PELVIS W CONTRAST   Final Result      No aortic dissection or aneurysm.         Electronically signed by Alycia Rossetti      MRI THORACIC SPINE WO CONTRAST   Final Result      No acute findings in the thoracic spine.      Electronically signed by Alycia Rossetti      XR CHEST PORTABLE   Final Result   No acute findings in the chest         Electronically signed by Berdine Addison        No results found.      PROCEDURES   Unless otherwise noted below, none    CRITICAL CARE   CRITICAL CARE NOTE:   N/A    CONSULTS:  IP CONSULT TO ORTHOPEDIC SURGERY      EMERGENCY DEPARTMENT COURSE and MDM:   Vitals:    Vitals:    07/21/22 2111 07/21/22 2112 07/22/22 0224 07/22/22 0518   BP: (!) 174/79  (!) 153/83    Pulse: 58  60    Resp: 16 16  16    Temp: 97.8 F (36.6 C)  97.8 F (36.6 C)    TempSrc: Oral  Oral    SpO2: 97%  94%    Weight:       Height:           Patient was given thefollowing medications:  Medications   acetaminophen (TYLENOL) tablet 650 mg (650 mg Oral Given 07/22/22 0515)   0.9 % sodium chloride infusion ( IntraVENous New Bag 07/21/22 0544)   albuterol sulfate HFA (PROVENTIL;VENTOLIN;PROAIR) 108 (90 Base) MCG/ACT inhaler 2 puff (has no administration in time range)   aspirin chewable tablet 81 mg ( Oral Automatically Held 07/27/22 0900)   atorvastatin (LIPITOR) tablet 40 mg (40 mg Oral Given 07/21/22 2112)   carvedilol (COREG) tablet 12.5 mg ( Oral Automatically Held 07/26/22 2100)   clopidogrel (PLAVIX) tablet 75 mg ( Oral Automatically Held 07/27/22 0900)   DULoxetine (CYMBALTA) extended release capsule 60 mg (60 mg Oral Given 07/21/22 1039)   ferrous sulfate (IRON 325) tablet 325 mg (325 mg Oral Given 07/21/22 1039)   gabapentin (NEURONTIN) capsule 400 mg (400  mg Oral Given 07/21/22 2112)   hydrOXYzine pamoate (VISTARIL) capsule 50 mg (50 mg Oral Given 07/20/22 2219)   lactobacillus (CULTURELLE) capsule 1 capsule (1 capsule Oral Given 07/21/22 1039)   linaclotide (LINZESS) capsule 145 mcg  ++NON FORMULARY++ (Patient Supplied) (145 mcg Oral Not Given 07/22/22 0519)   pantoprazole (PROTONIX) tablet 40 mg (40 mg Oral Given 07/22/22 0515)   tamsulosin (FLOMAX) capsule  0.4 mg (0.4 mg Oral Given 07/21/22 1038)   tiZANidine (ZANAFLEX) tablet 4 mg (4 mg Oral Given 07/21/22 2113)   insulin lispro (HUMALOG,ADMELOG) injection vial 0-4 Units ( SubCUTAneous Not Given 07/22/22 0831)   insulin lispro (HUMALOG,ADMELOG) injection vial 0-4 Units ( SubCUTAneous Not Given 07/21/22 2220)   glucose chewable tablet 16 g (has no administration in time range)   dextrose bolus 10% 125 mL (has no administration in time range)     Or   dextrose bolus 10% 250 mL (has no administration in time range)   glucagon injection 1 mg (has no administration in time range)   dextrose 10 % infusion (has no administration in time range)   sodium chloride flush 0.9 % injection 5-40 mL (10 mLs IntraVENous Given 07/21/22 2117)   sodium chloride flush 0.9 % injection 5-40 mL (has no administration in time range)   0.9 % sodium chloride infusion (has no administration in time range)   potassium chloride 10 mEq/100 mL IVPB (Peripheral Line) (has no administration in time range)   magnesium sulfate 2000 mg in 50 mL IVPB premix (has no administration in time range)   ondansetron (ZOFRAN-ODT) disintegrating tablet 4 mg (has no administration in time range)     Or   ondansetron (ZOFRAN) injection 4 mg (has no administration in time range)   melatonin tablet 3 mg (has no administration in time range)   polyethylene glycol (GLYCOLAX) packet 17 g (has no administration in time range)   nicotine (NICODERM CQ) 21 MG/24HR 1 patch (has no administration in time range)   heparin (porcine) injection 5,000 Units (5,000 Units SubCUTAneous  Given 07/22/22 0515)   traZODone (DESYREL) tablet 150 mg (150 mg Oral Given 07/21/22 2155)   oxyCODONE (ROXICODONE) immediate release tablet 5 mg ( Oral See Alternative 07/22/22 0518)     Or   oxyCODONE HCl (OXY-IR) immediate release tablet 10 mg (10 mg Oral Given 07/22/22 0518)   sodium chloride 0.9 % bolus 1,000 mL (0 mLs IntraVENous Stopped 07/20/22 1950)   magnesium sulfate 2000 mg in 50 mL IVPB premix (0 mg IntraVENous Stopped 07/20/22 2049)   iopamidol (ISOVUE-370) 76 % injection 80 mL (80 mLs IntraVENous Given 07/20/22 1744)   fentaNYL (SUBLIMAZE) injection 25 mcg (25 mcg IntraVENous Given 07/20/22 1907)   oxyCODONE (ROXICODONE) immediate release tablet 5 mg ( Oral See Alternative 07/21/22 1048)     Or   oxyCODONE HCl (OXY-IR) immediate release tablet 10 mg (10 mg Oral Given 07/21/22 1048)         Is this patient to be included in the SEP-1 Core Measure due to severe sepsis or septic shock?   No   Exclusion criteria - the patient is NOT to be included for SEP-1 Core Measure due to:  2+ SIRS criteria are not met    MDM:    CC/HPI Summary, DDx, ED Course, and Reassessment:     Patient presents as above.  Emergent etiologies considered.  Patient seen and examined.  Work-up initiated secondary to presentation, physical exam findings, vital signs and medical chart review.    In brief, 62 year old male presenting to the ED via private vehicle sent in by orthopedic surgery for back, upper neck pain.  Patient patient was seen and evaluated by orthopedic spine surgery, had a recent CT of the cervical thoracic spine which showed severe spinal stenosis.  He was sent to the ED for admission for further orthopedic spine evaluation.  Patient states has had ongoing pain issues.  He was referred to orthopedic spine by pain management.  Patient has a full assessment by orthopedic spine today and was sent in for further evaluation and treatment, possible surgical interventions.    Patient was found to be hypotensive on arrival and also  bradycardic with a heart rate in the 50s.  Patient otherwise alert oriented conversational, no obvious distress, appears to be neurologically intact, has no weakness has mild tenderness into the upper back/neck area, heart and lung sounds are normal limits, does have some mild epigastrium tenderness.  Patient was seen and evaluated by orthopedic spine and orders were placed for MRI.    Broader workup was initiated secondary to patient's hypotension and bradycardia, and fluids were initiated.  Patient remained that several readings of hypotension and bradycardia secondary to this did consult orthopedic spine surgery, just to make sure there is no other confounding factors causing his symptoms, any concern for neurogenic shock but there is no signs of trauma this appears to be more of acute on chronic issue.  I did speak with orthopedic spine-have a low suspicion that secondary to his presentation, does agree with a broader evaluation workup to identify the causation of patient's hypotension.    Patient's EKG overall appearing well.  Did have some mild elevation of his cardiac markers, will await repeat, patient's D-dimer elevated.  Will send down for a CTA chest abdomen pelvis to rule out any other acute thoracic, aortic or intra-abdominal pathology.  Patient did have his MRI already completed here in the ED which showed no signs of acute spine abnormality.  He did have mild elevation of his liver function test, his potassium was 5.4, was slightly dehydrated.    Blood pressure did improve with IV fluids.  Will await CTA chest abdomen pelvis reading.    CTA was acutely negative.  Did touch base with orthopedic spine and again we will look to admit to secondary to his neck pain is ongoing symptoms also have to be further evaluated for this bradycardia.    History from : Patient    Limitations to history : None    Patient was given the following medications:  Medications   acetaminophen (TYLENOL) tablet 650 mg (650 mg  Oral Given 07/22/22 0515)   0.9 % sodium chloride infusion ( IntraVENous New Bag 07/21/22 0544)   albuterol sulfate HFA (PROVENTIL;VENTOLIN;PROAIR) 108 (90 Base) MCG/ACT inhaler 2 puff (has no administration in time range)   aspirin chewable tablet 81 mg ( Oral Automatically Held 07/27/22 0900)   atorvastatin (LIPITOR) tablet 40 mg (40 mg Oral Given 07/21/22 2112)   carvedilol (COREG) tablet 12.5 mg ( Oral Automatically Held 07/26/22 2100)   clopidogrel (PLAVIX) tablet 75 mg ( Oral Automatically Held 07/27/22 0900)   DULoxetine (CYMBALTA) extended release capsule 60 mg (60 mg Oral Given 07/21/22 1039)   ferrous sulfate (IRON 325) tablet 325 mg (325 mg Oral Given 07/21/22 1039)   gabapentin (NEURONTIN) capsule 400 mg (400 mg Oral Given 07/21/22 2112)   hydrOXYzine pamoate (VISTARIL) capsule 50 mg (50 mg Oral Given 07/20/22 2219)   lactobacillus (CULTURELLE) capsule 1 capsule (1 capsule Oral Given 07/21/22 1039)   linaclotide (LINZESS) capsule 145 mcg  ++NON FORMULARY++ (Patient Supplied) (145 mcg Oral Not Given 07/22/22 0519)   pantoprazole (PROTONIX) tablet 40 mg (40 mg Oral Given 07/22/22 0515)   tamsulosin (FLOMAX) capsule 0.4 mg (0.4 mg Oral Given 07/21/22 1038)   tiZANidine (ZANAFLEX) tablet 4 mg (4 mg Oral Given 07/21/22 2113)   insulin  lispro (HUMALOG,ADMELOG) injection vial 0-4 Units ( SubCUTAneous Not Given 07/22/22 0831)   insulin lispro (HUMALOG,ADMELOG) injection vial 0-4 Units ( SubCUTAneous Not Given 07/21/22 2220)   glucose chewable tablet 16 g (has no administration in time range)   dextrose bolus 10% 125 mL (has no administration in time range)     Or   dextrose bolus 10% 250 mL (has no administration in time range)   glucagon injection 1 mg (has no administration in time range)   dextrose 10 % infusion (has no administration in time range)   sodium chloride flush 0.9 % injection 5-40 mL (10 mLs IntraVENous Given 07/21/22 2117)   sodium chloride flush 0.9 % injection 5-40 mL (has no administration in time range)   0.9 %  sodium chloride infusion (has no administration in time range)   potassium chloride 10 mEq/100 mL IVPB (Peripheral Line) (has no administration in time range)   magnesium sulfate 2000 mg in 50 mL IVPB premix (has no administration in time range)   ondansetron (ZOFRAN-ODT) disintegrating tablet 4 mg (has no administration in time range)     Or   ondansetron (ZOFRAN) injection 4 mg (has no administration in time range)   melatonin tablet 3 mg (has no administration in time range)   polyethylene glycol (GLYCOLAX) packet 17 g (has no administration in time range)   nicotine (NICODERM CQ) 21 MG/24HR 1 patch (has no administration in time range)   heparin (porcine) injection 5,000 Units (5,000 Units SubCUTAneous Given 07/22/22 0515)   traZODone (DESYREL) tablet 150 mg (150 mg Oral Given 07/21/22 2155)   oxyCODONE (ROXICODONE) immediate release tablet 5 mg ( Oral See Alternative 07/22/22 0518)     Or   oxyCODONE HCl (OXY-IR) immediate release tablet 10 mg (10 mg Oral Given 07/22/22 0518)   sodium chloride 0.9 % bolus 1,000 mL (0 mLs IntraVENous Stopped 07/20/22 1950)   magnesium sulfate 2000 mg in 50 mL IVPB premix (0 mg IntraVENous Stopped 07/20/22 2049)   iopamidol (ISOVUE-370) 76 % injection 80 mL (80 mLs IntraVENous Given 07/20/22 1744)   fentaNYL (SUBLIMAZE) injection 25 mcg (25 mcg IntraVENous Given 07/20/22 1907)   oxyCODONE (ROXICODONE) immediate release tablet 5 mg ( Oral See Alternative 07/21/22 1048)     Or   oxyCODONE HCl (OXY-IR) immediate release tablet 10 mg (10 mg Oral Given 07/21/22 1048)       Independent Imaging Interpretation by me: CTA chest abdomen pelvis negative, MRI thoracic spine negative    EKG (if obtained): See supervising physician's note for EKG interpretation     Chronic conditions affecting care: Chronic neck pain    Discussion with Other Profesionals : Admitting Team   and Consultant orthopedic spine    Social Determinants : None    Records Reviewed : Source reviewed orthopedics spine  evaluation.    Admission to hospital    I am the Primary Clinician of Record.        CLINICAL IMPRESSION      1. Neck pain    2. Lightheaded          DISPOSITION/PLAN   DISPOSITION Admitted 07/20/2022 08:31:13 PM      PATIENT REFERREDTO:  No follow-up provider specified.    DISCHARGE MEDICATIONS:  Current Discharge Medication List          DISCONTINUED MEDICATIONS:  Current Discharge Medication List                 (Please note that portions ofthis note were  completed with a voice recognition program.  Efforts were made to edit the dictations but occasionally words are mis-transcribed.)    Hervey Ard, PA (electronically signed)             Hervey Ard, [place]  07/22/22 [phone]

## 2022-07-20 NOTE — ED Notes (Signed)
Called the floor to let them know the sbar is in

## 2022-07-20 NOTE — ED Triage Notes (Signed)
Pt referred by PCP for emergent surgery, pt has a pain in his neck that is causing pt to have pain in his back and legs, pt hypotensive.

## 2022-07-20 NOTE — Consults (Cosign Needed)
Neurosurgery   Consult Note      Reason for Consult:  Cervical myelopathy  Attending Physician: Christiane Ha, PA-C    Date of Admission: 07/20/2022  Subjective:   CHIEF COMPLAINT: Neck pain, low back.,  Gait instability    HPI:  62 y.o. January 13, 1961  Who presented to the ED 07/20/2022 after being seen at Dr. Encarnacion Chu office to follow-up on a cervical MRI.  Patient reports significant neck pain with numbness radiating into both arms bilaterally, worse in his left arm.  He also reports numbness in his bilateral lower extremities, worse in his left leg.  He uses a cane for ambulation but is very unsteady.  Takes wide shuffling like steps.  His cervical MRI shows severe stenosis at multiple levels.  Given patient's extreme entomology, is recommended he come be evaluated for urgent decompression.  He does not have any urinary or bowel incontinence.  Patient is on Plavix, last took this morning.  PMHx positive for CAD, diabetes, hyperlipidemia, hypertension, nerve pain, pancreatitis.  Past surgical history listed in chart                      Past Medical and Surgical History:       Diagnosis Date    CAD (coronary artery disease)     Chronic back pain     Had shots August 2019    Diabetes [facility])     Diagnosed about 2009    H/O cardiac catheterization 08/02/2017     severe single vessel CAD of the distal LCX which is small caliber and not amenable to PCI.    H/O cardiovascular stress test 07/03/2017    scan shows moderate size, severe intensity, non reversible perfusion defect in inferolateral wall, normal LVEF.    H/O Doppler lower arterial ultrasound 07/11/2017    The Right Proximal and mid SFA exhibits an occlusion. The Left SFA exhibits an occlusion between the mid and distal portions. Right ABIs show Severe peripheral arterial disease.    H/O echocardiogram 05/30/2017    Left ventricular systolic function is normal. Ejection fraction is visually estimated at >55%.    Hyperlipidemia     Hypertension     IBS  (irritable bowel syndrome)     Nerve pain     Pancreatitis     PVD (peripheral vascular disease) (HCC) 08/02/2017     Severe lower extremity PAD. See abn Peripherial Angio. ( ABN Arterial US.07/11/17)    Scoliosis          Procedure Laterality Date    CHG US GUIDANCE NEEDLE PLACEMENT IMG S&I         Social History:    TOBACCO:   reports that he has been smoking cigarettes. He started smoking about 50 years ago. He has a 50.5 pack-year smoking history. He has never used smokeless tobacco.  ETOH:   reports no history of alcohol use.    Family History:       Problem Relation Age of Onset    Stroke Sister     Heart Attack Brother        Current Medications:    No current facility-administered medications for this encounter.    No Known Allergies     REVIEW OF SYSTEMS:    CONSTITUTIONAL:  negative for fevers, chills, diaphoresis, activity change, appetite change, fatigue  EYES:  negative for blurred vision, eye discharge, visual disturbance and icterus  HEENT:  negative for hearing loss, tinnitus, ear drainage, sinus pressure, nasal  congestion  RESPIRATORY:  No cough, shortness of breath, hemoptysis  GASTROINTESTINAL:  negative for nausea, vomiting, diarrhea, constipation, blood in stool and abdominal pain  GENITOURINARY:  negative for frequency, dysuria, urinary incontinence, decreased urine volume  HEMATOLOGIC/LYMPHATIC:  negative for easy bruising, bleeding and lymphadenopathy  ALLERGIC/IMMUNOLOGIC:  negative for recurrent infections, angioedema, anaphylaxis and drug reactions  MUSCULOSKELETAL: positive for neck pain, negative for joint swelling, decreased range of motion and muscle weakness  NEUROLOGICAL:  negative for headaches, slurred speech, unilateral weakness  PSYCHIATRIC/BEHAVIORAL: negative for hallucinations, behavioral problems, confusion and agitation.       Objective:   PHYSICAL EXAM:      VITALS:  BP (!) 82/49   Pulse 59   Temp 98.2 F (36.8 C) (Oral)   Resp 16   SpO2 95%      24HR INTAKE/OUTPUT:  No  intake or output data in the 24 hours ending 07/20/22 1624  CONSTITUTIONAL:  Awake, alert, cooperative, no apparent distress, and appears stated age  HEENT: NCAT, PERRL, EOMI.  Sclera white, conjunctive full.    NECK:  Supple, symmetrical, trachea midline, no adenopathy  PSYCHIATRIC: Oriented to person place and time. No obvious depression or anxiety.  MUSCULOSKELETAL: No obvious misalignment or effusion of the joints. No clubbing, cyanosis of the digits.  SKIN:  normal skin color, texture, turgor and no redness, warmth, or swelling.   NEUROLOGIC: Alert and oriented x4, face symmetrical, no obvious droop, speech clear and coherent, sensation intact to light touch and pinprick sensation.   Upper extremity strength: bilateral upper extremities 5/5 throughout, hoffman's present bilaterally, clonus in right hand  Lower extremity strength: bilateral lower extremities 5/5 throughout, no clonus bilaterally    DATA:    Old records have been reviewed    CBC:  No results for input(s): "WBC", "RBC", "HGB", "HCT", "PLT", "MCV", "MCH", "MCHC", "RDW", "NRBC", "BANDSPCT" in the last 72 hours.    Invalid input(s): "SEGSPCT"   BMP:  No results for input(s): "NA", "K", "CL", "CO2", "BUN", "CREATININE", "CALCIUM", "GLUCOSE" in the last 72 hours.     Radiology Review:  All pertinent images / reports were reviewed as a part of this visit.     MRI cervical 07/12/22  IMPRESSION:     1. Severe central canal stenosis at C3-C4 and C4-C5.     2. Severe multilevel disc degeneration and spondylosis with severe bilateral  exit neural foraminal encroachment at C3-C4, C4-C5, C5-C6, and C6-C7.     Electronically signed by Virgina Jock    MRI lumbar wo contrast 07/12/22  IMPRESSION:     1. Mild multilevel facet degeneration. Details above.     Electronically signed by Virgina Jock    Assessment:   Cervical stenosis with myelopathy  Hypotension  Smoker   DM type 2  Plan:   Patient who presents to Dr. Encarnacion Chu office for concerns of significant cervical  myelopathy.  Patient has a cervical MRI that shows severe stenosis C3-C5 with multilevel spondylosis.  Would like MRI thoracic to be obtained to rule out compression.  Patient in the ER is hypotensive.  Will be given fluids.  Patient will need to have Plavix held for 5 days before surgical intervention, planning for 07/25/22.  Okay to bridge patient with heparin if needed.      Electronically signed by Jonetta Speak, PA-C on 07/20/2022 at 4:24 PM    Supervising physician: Sandria Manly IV, MD.  Dr. Luther Redo was readily and continuously available by phone for direct consultation regarding the  care of this patient.    Time spent with patient in consultation, education, and collaboration with medical time is >50% of total time spent on case, including time spent in chart review and dictation. Total time spent: 40 minutes    Thank you for the opportunity to participate in the care of your patient.      Dragon dictation may have been used in the writing of this note. Spelling or word errors may have occurred. Please call for clarification if there are concerns.

## 2022-07-20 NOTE — ED Notes (Signed)
1730 paged Dr Luther Redo    1732 Dr Luther Redo returned call

## 2022-07-20 NOTE — ED Notes (Signed)
ED TO INPATIENT SBAR HANDOFF    Patient Name: Ricardo Foley   DOB:  06/10/1960  62 y.o.   Preferred Name    Family/Caregiver Present no   Restraints no   C-SSRS: Risk of Suicide: No Risk  Sitter no   Sepsis Risk Score        Situation  Chief Complaint   Patient presents with    Neck Pain     States he needs emergency surgery for pain that he has had for 20 years, sent by Dr. Luther Redo     Brief Description of Patient's Condition: pt was seen at his other ortho doctor and he said for him to come in for surgery pt has gotten a bolus of ns and ns running at 162ml/hr and has a mag drip running pt has had mri completed and of fentanyl for pain, pt uses a cane to ambulate   Mental Status: oriented  Arrived from: home    Imaging:   CTA CHEST ABDOMEN PELVIS W CONTRAST   Final Result      No aortic dissection or aneurysm.         Electronically signed by Alycia Rossetti      MRI THORACIC SPINE WO CONTRAST   Final Result      No acute findings in the thoracic spine.      Electronically signed by Alycia Rossetti      XR CHEST PORTABLE   Final Result   No acute findings in the chest         Electronically signed by Berdine Addison        Abnormal labs:   Abnormal Labs Reviewed   CBC WITH AUTO DIFFERENTIAL - Abnormal; Notable for the following components:       Result Value    WBC 13.4 (*)     RBC 3.89 (*)     Hemoglobin 11.0 (*)     Hematocrit 34.5 (*)     MCHC 31.9 (*)     RDW 18.1 (*)     Neutrophils % 32.0 (*)     Eosinophils % 14.0 (*)     Lymphocytes % 47.0 (*)     Monocytes % 7.0 (*)     All other components within normal limits   COMPREHENSIVE METABOLIC PANEL - Abnormal; Notable for the following components:    Potassium 5.4 (*)     Creatinine 1.4 (*)     Est, Glom Filt Rate 58 (*)     AST 11 (*)     All other components within normal limits   TROPONIN - Abnormal; Notable for the following components:    Troponin, High Sensitivity 34 (*)     All other components within normal limits   TROPONIN - Abnormal; Notable for the following  components:    Troponin, High Sensitivity 30 (*)     All other components within normal limits   LIPASE - Abnormal; Notable for the following components:    Lipase 152 (*)     All other components within normal limits   D-DIMER, RAPID - Abnormal; Notable for the following components:    D-Dimer, Quant 0.53 (*)     All other components within normal limits   MAGNESIUM - Abnormal; Notable for the following components:    Magnesium 1.4 (*)     All other components within normal limits   HEMOGLOBIN A1C - Abnormal; Notable for the following components:    Hemoglobin A1C 6.4 (*)  All other components within normal limits        Background  History:   Past Medical History:   Diagnosis Date    CAD (coronary artery disease)     Chronic back pain     Had shots August 2019    Diabetes [facility])     Diagnosed about 2009    H/O cardiac catheterization 08/02/2017     severe single vessel CAD of the distal LCX which is small caliber and not amenable to PCI.    H/O cardiovascular stress test 07/03/2017    scan shows moderate size, severe intensity, non reversible perfusion defect in inferolateral wall, normal LVEF.    H/O Doppler lower arterial ultrasound 07/11/2017    The Right Proximal and mid SFA exhibits an occlusion. The Left SFA exhibits an occlusion between the mid and distal portions. Right ABIs show Severe peripheral arterial disease.    H/O echocardiogram 05/30/2017    Left ventricular systolic function is normal. Ejection fraction is visually estimated at >55%.    Hyperlipidemia     Hypertension     IBS (irritable bowel syndrome)     Nerve pain     Pancreatitis     PVD (peripheral vascular disease) (HCC) 08/02/2017     Severe lower extremity PAD. See abn Peripherial Angio. ( ABN Arterial US.07/11/17)    Scoliosis        Assessment    Vitals: MEWS Score: 2  Level of Consciousness: Alert (0)   Vitals:    07/20/22 1900 07/20/22 1942 07/20/22 1945 07/20/22 1947   BP: (!) 127/59  110/64    Pulse: 62 57 57    Resp: 13 13 17 17     Temp:       TempSrc:       SpO2: 94% 98% 98% 98%     PO Status: I have not given him anything   O2 Flow Rate: O2 Device: None (Room air)    Cardiac Rhythm: n   Last documented pain medication administered: 1907  NIH Score: NIH     Active LDA's:   Peripheral IV 07/20/22 Right Antecubital (Active)       Pertinent or High Risk Medications/Drips: yes   If Yes, please provide details: mag  Blood Product Administration: no  If Yes, please provide details:     Recommendation    Incomplete orders   Additional Comments:    If any further questions, please call Sending RN at [phone]    Electronically signed by: Electronically signed by Frankey Shown, RN on 07/20/2022 at 8:21 PM

## 2022-07-21 LAB — EKG 12-LEAD
Atrial Rate: 60 {beats}/min
Diagnosis: NORMAL
P Axis: 43 degrees
P-R Interval: 138 ms
Q-T Interval: 438 ms
QRS Duration: 86 ms
QTc Calculation (Bazett): 438 ms
R Axis: -10 degrees
T Axis: 76 degrees
Ventricular Rate: 60 {beats}/min

## 2022-07-21 LAB — CBC WITH AUTO DIFFERENTIAL
Basophils %: 0.7 % (ref 0–1)
Basophils Absolute: 0.1 10*3/uL
Eosinophils %: 12.3 % — ABNORMAL HIGH (ref 0–3)
Eosinophils Absolute: 1.5 10*3/uL
Hematocrit: 31.4 % — ABNORMAL LOW (ref 42–52)
Hemoglobin: 10.2 GM/DL — ABNORMAL LOW (ref 13.5–18.0)
Immature Neutrophil %: 0.7 % — ABNORMAL HIGH (ref 0–0.43)
Lymphocytes %: 49.1 % — ABNORMAL HIGH (ref 24–44)
Lymphocytes Absolute: 5.9 10*3/uL
MCH: 28.6 PG (ref 27–31)
MCHC: 32.5 % (ref 32.0–36.0)
MCV: 88 FL (ref 78–100)
MPV: 9.6 FL (ref 7.5–11.1)
Monocytes %: 5.2 % — ABNORMAL HIGH (ref 0–4)
Monocytes Absolute: 0.6 10*3/uL
Neutrophils %: 32 % — ABNORMAL LOW (ref 36–66)
Neutrophils Absolute: 3.8 10*3/uL
Nucleated RBC %: 0 %
Platelets: 260 10*3/uL (ref 140–440)
RBC: 3.57 10*6/uL — ABNORMAL LOW (ref 4.6–6.2)
RDW: 18.1 % — ABNORMAL HIGH (ref 11.7–14.9)
Total Immature Neutrophil: 0.08 10*3/uL
Total Nucleated RBC: 0 10*3/uL
WBC: 11.9 10*3/uL — ABNORMAL HIGH (ref 4.0–10.5)

## 2022-07-21 LAB — POCT GLUCOSE
POC Glucose: 120 MG/DL — ABNORMAL HIGH (ref 70–99)
POC Glucose: 152 MG/DL — ABNORMAL HIGH (ref 70–99)
POC Glucose: 145 MG/DL — ABNORMAL HIGH (ref 70–99)
POC Glucose: 146 MG/DL — ABNORMAL HIGH (ref 70–99)

## 2022-07-21 LAB — URINALYSIS
Bilirubin, Urine: NEGATIVE MG/DL
Blood, Urine: NEGATIVE
Glucose, Ur: NEGATIVE MG/DL
Ketones, Urine: NEGATIVE MG/DL
Leukocyte Esterase, Urine: NEGATIVE
Nitrite Urine, Quantitative: NEGATIVE
Protein, UA: NEGATIVE MG/DL
Specific Gravity, UA: 1.01 (ref 1.001–1.035)
Urobilinogen, Urine: 0.2 MG/DL (ref 0.2–1.0)
pH, Urine: 5.5 (ref 5.0–8.0)

## 2022-07-21 LAB — PROTIME-INR
INR: 1 INDEX
Protime: 13.2 SECONDS (ref 11.7–14.5)

## 2022-07-21 LAB — BASIC METABOLIC PANEL W/ REFLEX TO MG FOR LOW K
Anion Gap: 9 (ref 7–16)
BUN: 17 MG/DL (ref 6–23)
CO2: 25 MMOL/L (ref 21–32)
Calcium: 8.8 MG/DL (ref 8.3–10.6)
Chloride: 106 mMol/L (ref 99–110)
Creatinine: 1.1 MG/DL (ref 0.9–1.3)
Est, Glom Filt Rate: 77 mL/min/{1.73_m2} (ref >60)
Glucose: 104 MG/DL — ABNORMAL HIGH (ref 70–99)
Potassium: 5 MMOL/L (ref 3.5–5.1)
Sodium: 140 MMOL/L (ref 135–145)

## 2022-07-21 LAB — LACTATE, SEPSIS: Lactic Acid, Sepsis: 1.1 mMOL/L (ref 0.4–2.0)

## 2022-07-21 LAB — TSH WITH REFLEX: TSH, High Sensitivity: 2.37 u[IU]/mL (ref 0.270–4.20)

## 2022-07-21 MED ORDER — OXYCODONE HCL 5 MG PO TABS
5 | ORAL | Status: DC | PRN
Start: 2022-07-21 — End: 2022-07-27
  Administered 2022-07-26: 10:00:00 5 mg via ORAL

## 2022-07-21 MED ORDER — NORMAL SALINE FLUSH 0.9 % IV SOLN
0.9 | Freq: Two times a day (BID) | INTRAVENOUS | Status: DC
Start: 2022-07-21 — End: 2022-07-27
  Administered 2022-07-21 – 2022-07-26 (×10): 10 mL via INTRAVENOUS

## 2022-07-21 MED ORDER — SODIUM CHLORIDE 0.9 % IV SOLN
0.9 | INTRAVENOUS | Status: AC
Start: 2022-07-21 — End: 2022-07-21
  Administered 2022-07-21 (×2): via INTRAVENOUS

## 2022-07-21 MED ORDER — FERROUS SULFATE 325 (65 FE) MG PO TABS
325 | Freq: Every day | ORAL | Status: DC
Start: 2022-07-21 — End: 2022-07-27
  Administered 2022-07-21 – 2022-07-27 (×6): 325 mg via ORAL

## 2022-07-21 MED ORDER — DULOXETINE HCL 30 MG PO CPEP
30 | Freq: Every day | ORAL | Status: DC
Start: 2022-07-21 — End: 2022-07-27
  Administered 2022-07-21 – 2022-07-27 (×6): 60 mg via ORAL

## 2022-07-21 MED ORDER — DEXTROSE 10 % IV SOLN
10 | INTRAVENOUS | Status: DC | PRN
Start: 2022-07-21 — End: 2022-07-27

## 2022-07-21 MED ORDER — GABAPENTIN 400 MG PO CAPS
400 | Freq: Three times a day (TID) | ORAL | Status: DC
Start: 2022-07-21 — End: 2022-07-27
  Administered 2022-07-21 – 2022-07-27 (×18): 400 mg via ORAL

## 2022-07-21 MED ORDER — MELATONIN 3 MG PO TABS
3 | Freq: Every evening | ORAL | Status: DC | PRN
Start: 2022-07-21 — End: 2022-07-27
  Administered 2022-07-27: 06:00:00 3 mg via ORAL

## 2022-07-21 MED ORDER — INSULIN LISPRO 100 UNIT/ML IJ SOLN
100 | Freq: Every evening | INTRAMUSCULAR | Status: DC
Start: 2022-07-21 — End: 2022-07-27

## 2022-07-21 MED ORDER — TRAZODONE HCL 50 MG PO TABS
50 | Freq: Every evening | ORAL | Status: DC
Start: 2022-07-21 — End: 2022-07-27
  Administered 2022-07-21 – 2022-07-27 (×7): 150 mg via ORAL

## 2022-07-21 MED ORDER — OXYCODONE HCL 10 MG PO TABS
10 MG | ORAL | Status: AC | PRN
Start: 2022-07-21 — End: 2022-07-27
  Administered 2022-07-22 – 2022-07-27 (×26): 10 mg via ORAL

## 2022-07-21 MED ORDER — GLUCAGON HCL (DIAGNOSTIC) 1 MG IJ SOLR
1 | INTRAMUSCULAR | Status: DC | PRN
Start: 2022-07-21 — End: 2022-07-27

## 2022-07-21 MED ORDER — CULTURELLE PO CAPS
Freq: Every day | ORAL | Status: DC
Start: 2022-07-21 — End: 2022-07-27
  Administered 2022-07-21 – 2022-07-27 (×6): 1 via ORAL

## 2022-07-21 MED ORDER — POTASSIUM CHLORIDE 10 MEQ/100ML IV SOLN
10 | INTRAVENOUS | Status: DC | PRN
Start: 2022-07-21 — End: 2022-07-27

## 2022-07-21 MED ORDER — NICOTINE 21 MG/24HR TD PT24
21 | Freq: Every day | TRANSDERMAL | Status: DC | PRN
Start: 2022-07-21 — End: 2022-07-27

## 2022-07-21 MED ORDER — ONDANSETRON HCL 4 MG/2ML IJ SOLN
4 | Freq: Four times a day (QID) | INTRAMUSCULAR | Status: DC | PRN
Start: 2022-07-21 — End: 2022-07-26

## 2022-07-21 MED ORDER — ACETAMINOPHEN 325 MG PO TABS
325 | Freq: Four times a day (QID) | ORAL | Status: DC
Start: 2022-07-21 — End: 2022-07-26
  Administered 2022-07-21 – 2022-07-26 (×19): 650 mg via ORAL

## 2022-07-21 MED ORDER — MAGNESIUM SULFATE 2000 MG/50 ML IVPB PREMIX
2 | INTRAVENOUS | Status: DC | PRN
Start: 2022-07-21 — End: 2022-07-27

## 2022-07-21 MED ORDER — PANTOPRAZOLE SODIUM 40 MG PO TBEC
40 | Freq: Two times a day (BID) | ORAL | Status: DC
Start: 2022-07-21 — End: 2022-07-27
  Administered 2022-07-21 – 2022-07-27 (×14): 40 mg via ORAL

## 2022-07-21 MED ORDER — OXYCODONE HCL 5 MG PO TABS
5 MG | ORAL | Status: AC | PRN
Start: 2022-07-21 — End: 2022-07-21
  Administered 2022-07-21: 10:00:00 5 mg via ORAL

## 2022-07-21 MED ORDER — DEXTROSE 10 % IV BOLUS
INTRAVENOUS | Status: DC | PRN
Start: 2022-07-21 — End: 2022-07-27

## 2022-07-21 MED ORDER — ATORVASTATIN CALCIUM 40 MG PO TABS
40 | Freq: Every evening | ORAL | Status: DC
Start: 2022-07-21 — End: 2022-07-27
  Administered 2022-07-21 – 2022-07-27 (×7): 40 mg via ORAL

## 2022-07-21 MED ORDER — SODIUM CHLORIDE 0.9 % IV SOLN
0.9 | INTRAVENOUS | Status: DC | PRN
Start: 2022-07-21 — End: 2022-07-27

## 2022-07-21 MED ORDER — LINACLOTIDE 145 MCG PO CAPS
145 | Freq: Every day | ORAL | Status: DC
Start: 2022-07-21 — End: 2022-07-27

## 2022-07-21 MED ORDER — ASPIRIN 81 MG PO CHEW
81 | Freq: Every day | ORAL | Status: DC
Start: 2022-07-21 — End: 2022-07-27

## 2022-07-21 MED ORDER — ONDANSETRON 4 MG PO TBDP
4 | Freq: Three times a day (TID) | ORAL | Status: DC | PRN
Start: 2022-07-21 — End: 2022-07-26

## 2022-07-21 MED ORDER — CLOPIDOGREL BISULFATE 75 MG PO TABS
75 | Freq: Every day | ORAL | Status: DC
Start: 2022-07-21 — End: 2022-07-27

## 2022-07-21 MED ORDER — OXYCODONE HCL 10 MG PO TABS
10 MG | ORAL | Status: AC | PRN
Start: 2022-07-21 — End: 2022-07-21
  Administered 2022-07-21 (×2): 10 mg via ORAL

## 2022-07-21 MED ORDER — HEPARIN SODIUM (PORCINE) 5000 UNIT/ML IJ SOLN
5000 | Freq: Three times a day (TID) | INTRAMUSCULAR | Status: DC
Start: 2022-07-21 — End: 2022-07-27
  Administered 2022-07-21 – 2022-07-25 (×13): 5000 [IU] via SUBCUTANEOUS

## 2022-07-21 MED ORDER — HYDROXYZINE PAMOATE 25 MG PO CAPS
25 | Freq: Three times a day (TID) | ORAL | Status: DC | PRN
Start: 2022-07-21 — End: 2022-07-27
  Administered 2022-07-21: 02:00:00 50 mg via ORAL

## 2022-07-21 MED ORDER — CARVEDILOL 6.25 MG PO TABS
6.25 | Freq: Two times a day (BID) | ORAL | Status: DC
Start: 2022-07-21 — End: 2022-07-27
  Administered 2022-07-21: 02:00:00 12.5 mg via ORAL

## 2022-07-21 MED ORDER — ALBUTEROL SULFATE HFA 108 (90 BASE) MCG/ACT IN AERS
108 | Freq: Four times a day (QID) | RESPIRATORY_TRACT | Status: DC | PRN
Start: 2022-07-21 — End: 2022-07-27

## 2022-07-21 MED ORDER — POLYETHYLENE GLYCOL 3350 17 G PO PACK
17 | Freq: Every day | ORAL | Status: DC | PRN
Start: 2022-07-21 — End: 2022-07-27

## 2022-07-21 MED ORDER — NORMAL SALINE FLUSH 0.9 % IV SOLN
0.9 | INTRAVENOUS | Status: DC | PRN
Start: 2022-07-21 — End: 2022-07-27

## 2022-07-21 MED ORDER — GLUCOSE 4 G PO CHEW
4 | ORAL | Status: DC | PRN
Start: 2022-07-21 — End: 2022-07-27

## 2022-07-21 MED ORDER — INSULIN LISPRO 100 UNIT/ML IJ SOLN
100 | Freq: Three times a day (TID) | INTRAMUSCULAR | Status: DC
Start: 2022-07-21 — End: 2022-07-27

## 2022-07-21 MED ORDER — TAMSULOSIN HCL 0.4 MG PO CAPS
0.4 | Freq: Every day | ORAL | Status: DC
Start: 2022-07-21 — End: 2022-07-27
  Administered 2022-07-21 – 2022-07-27 (×6): 0.4 mg via ORAL

## 2022-07-21 MED ORDER — TIZANIDINE HCL 4 MG PO TABS
4 | Freq: Three times a day (TID) | ORAL | Status: DC
Start: 2022-07-21 — End: 2022-07-27
  Administered 2022-07-21 – 2022-07-27 (×18): 4 mg via ORAL

## 2022-07-21 MED FILL — CULTURELLE PO CAPS: ORAL | Qty: 1

## 2022-07-21 MED FILL — ACETAMINOPHEN 325 MG PO TABS: 325 MG | ORAL | Qty: 2

## 2022-07-21 MED FILL — GABAPENTIN 400 MG PO CAPS: 400 MG | ORAL | Qty: 1

## 2022-07-21 MED FILL — OXYCODONE HCL 10 MG PO TABS: 10 MG | ORAL | Qty: 1

## 2022-07-21 MED FILL — FERROUS SULFATE 325 (65 FE) MG PO TABS: 325 (65 Fe) MG | ORAL | Qty: 1

## 2022-07-21 MED FILL — SODIUM CHLORIDE 0.9 % IV SOLN: 0.9 % | INTRAVENOUS | Qty: 1000

## 2022-07-21 MED FILL — HEPARIN SODIUM (PORCINE) 5000 UNIT/ML IJ SOLN: 5000 UNIT/ML | INTRAMUSCULAR | Qty: 1

## 2022-07-21 MED FILL — TAMSULOSIN HCL 0.4 MG PO CAPS: 0.4 MG | ORAL | Qty: 1

## 2022-07-21 MED FILL — LIPITOR 40 MG PO TABS: 40 MG | ORAL | Qty: 1

## 2022-07-21 MED FILL — TRAZODONE HCL 50 MG PO TABS: 50 MG | ORAL | Qty: 3

## 2022-07-21 MED FILL — PANTOPRAZOLE SODIUM 40 MG PO TBEC: 40 MG | ORAL | Qty: 1

## 2022-07-21 MED FILL — TIZANIDINE HCL 4 MG PO TABS: 4 MG | ORAL | Qty: 1

## 2022-07-21 MED FILL — OXYCODONE HCL 5 MG PO TABS: 5 MG | ORAL | Qty: 1

## 2022-07-21 MED FILL — HYDROXYZINE PAMOATE 25 MG PO CAPS: 25 MG | ORAL | Qty: 2

## 2022-07-21 MED FILL — BD POSIFLUSH 0.9 % IV SOLN: 0.9 % | INTRAVENOUS | Qty: 40

## 2022-07-21 MED FILL — CARVEDILOL 6.25 MG PO TABS: 6.25 MG | ORAL | Qty: 2

## 2022-07-21 MED FILL — CYMBALTA 30 MG PO CPEP: 30 MG | ORAL | Qty: 2

## 2022-07-21 NOTE — Progress Notes (Signed)
V2.0    [facility] Progress Note      Name:  Ricardo Foley DOB/Age/Sex: 04/20/1960  (61 y.o. male)   MRN & CSN:  [id number] & [id number] Encounter Date/Time: 07/21/2022 7:28 AM EDT   Location:  3018/3018-A PCP: Oval Linsey, APRN - CNP     Attending:Xzandria Clevinger, Huntley Dec, MD       Hospital Day: 2    Assessment and Recommendations       Plan:   Patient is a 62 year old male who presented with neck pain.      # Cervical myelopathy   - Endorsed neck pain with bilateral arm (L>R) and leg numbness. Ambulates with cane, but unsteady gait. No falls or recent injury/trauma. No saddle anesthesia or B/B incontinence. Recent MRI C/L-spine showed severe central canal stenosis of C3-C4 and C4-C5 with DDD and spondylosis. Sent from NSGY clinic for potential decompression.   - MRI T-spine nonacute. CTA nonacute.   - Plan for surgical intervention on 7/2. Continue holding Plavix. Supportive care.      # AKI, mild  - Clinically hypovolemic. Cr 1.4, baseline ~ 0.8. UA negative.  - Will challenge with IVF. Strict I/O's. Bladder scan if decreased voiding. Avoid NSAIDs.     # CAD  # Non-MI troponin elevation  # PVD  - Denied any typical CP. LHC in 07/2017 showed severe dLCx disease, not amenable to PCI.   - Initial Tn mildly elevated, repeat flat. ECG without acute ischemic changes.  - Likely secondary to decreased renal clearance. Hold ASA and Plavix for surgery, continue Lipitor.      # Essential hypertension  - Hold Coreg in setting of borderline hypotension.     # Hyperkalemia  - Mild, <5.6.  - Follow-up repeat labs.     # Hypomagnesemia  - Mild, repleted.  - Follow-up repeat labs.     # Leukocytosis  - Chronic, no fevers, LA normal.   - Monitor labs.      # Elevated lipase  - Denied any typical abdominal pain.   - CT non-acute.   - Not consistent with pancreatitis.      # T2DM with hyperglycemia  - Last A1c 6.4% on admission.   - Held Metformin.  - LCSI.     # Tobacco abuse  - Hx of smoking 1.5 PPD for over 40 years.  - Advised on importance  of complete cessation.  - Nicotine patches prn.      Diet ADULT DIET; Regular   DVT Prophylaxis []  Lovenox, []   Heparin, []  SCDs, []  Ambulation,  []  Eliquis, []  Xarelto  []  Coumadin   Code Status Full Code   Disposition From: Home  Expected Disposition: TBD  Estimated Date of Discharge: TBD  Patient requires continued admission due to surgical intervention on 7/2   Surrogate Decision Maker/ POA       Personally reviewed Lab Studies and Imaging           Subjective:     Chief Complaint:     Ricardo Foley is a 62 y.o. male who presents with neck pain and bilateral arm pain L>R    Patient seen at bedside, understands the plan for surgery     Denies nausea vomiting abdominal pain or any other complaints       Review of Systems:      Pertinent positives and negatives discussed in HPI    Objective:     Intake/Output Summary (Last 24 hours) at 07/21/2022 8295  Last  data filed at 07/21/2022 1610  Gross per 24 hour   Intake --   Output 1720 ml   Net -1720 ml      Vitals:   Vitals:    07/20/22 2219 07/20/22 2249 07/21/22 0221 07/21/22 0616   BP: 135/65  135/68    Pulse: 85  60    Resp:  18 17 18    Temp:   97.7 F (36.5 C)    TempSrc:   Oral    SpO2:   96%    Weight:       Height:             Physical Exam:      BMI: There is no height or weight on file to calculate BMI.  General: Awake.     HEENT: PERRLA. Vision grossly intact. Normal hearing. Oropharynx clear.   Neck: Supple. No JVD.   CV: RRR. NL S1/S2. No BLE edema.   Pulm: NL effort on RA. CTAB.   GI: +BS x4. Soft. NT/ND.  GU: No CVA tenderness. No Foley catheter.  Skin: Intact, warm and dry.  MSK: No gross joint deformities. Full ROM.   Neuro: AAOx3. CNs grossly intact. Normal speech. No focal deficit. 5/5 strength. Right hand clonus. Normal sensation.   Psych: Good judgement and reason.        Medications:   Medications:    acetaminophen  650 mg Oral 4 times per day    [Held by provider] aspirin  81 mg Oral Daily    atorvastatin  40 mg Oral Nightly    [Held by  provider] carvedilol  12.5 mg Oral BID    [Held by provider] clopidogrel  75 mg Oral Daily    DULoxetine  60 mg Oral Daily    ferrous sulfate  325 mg Oral Daily    gabapentin  400 mg Oral TID    lactobacillus  1 capsule Oral Daily    linaclotide  145 mcg Oral Daily    pantoprazole  40 mg Oral BID AC    tamsulosin  0.4 mg Oral Daily    tiZANidine  4 mg Oral TID    insulin lispro  0-4 Units SubCUTAneous TID WC    insulin lispro  0-4 Units SubCUTAneous Nightly    sodium chloride flush  5-40 mL IntraVENous 2 times per day    heparin (porcine)  5,000 Units SubCUTAneous 3 times per day    traZODone  150 mg Oral Nightly      Infusions:    dextrose      sodium chloride       PRN Meds: oxyCODONE, 5 mg, Q4H PRN   Or  oxyCODONE, 10 mg, Q4H PRN  albuterol sulfate HFA, 2 puff, Q6H PRN  hydrOXYzine pamoate, 50 mg, TID PRN  glucose, 4 tablet, PRN  dextrose bolus, 125 mL, PRN   Or  dextrose bolus, 250 mL, PRN  glucagon (rDNA), 1 mg, PRN  dextrose, , Continuous PRN  sodium chloride flush, 5-40 mL, PRN  sodium chloride, , PRN  potassium chloride, 10 mEq, PRN  magnesium sulfate, 2,000 mg, PRN  ondansetron, 4 mg, Q8H PRN   Or  ondansetron, 4 mg, Q6H PRN  melatonin, 3 mg, Nightly PRN  polyethylene glycol, 17 g, Daily PRN  nicotine, 1 patch, Daily PRN        Labs and Imaging   CTA CHEST ABDOMEN PELVIS W CONTRAST    Result Date: 07/20/2022  EXAMINATION: CTA CHEST ABDOMEN PELVIS W CONTRAST 07/20/2022  5:44 PM ACCESSION NUMBER: [id number] COMPARISON: None available INDICATION: abdominal pain, back pain, elevaed d dimer, hypotension TECHNIQUE: Dedicated CT angiographic images were obtained from the lung apices to the symphysis pubis following administration Iso-vue .  Additional delayed images were obtained through the abdomen and pelvis.  Multiplanar reformats and 3-D reprocessing was utilized for optimal assessment of the images. Radiation dose reduction techniques were used for this study. Our CT scanners use one or all of the  following: Automated exposure control, adjustment of the mA and/or kV according to patient size, iterative reconstruction. FINDINGS: AIRWAYS: The central airways are patent. LUNGS: The lungs are clear bilaterally.  No suspicious pulmonary nodules. PLEURA: No pleural effusion or pneumothorax. HEART: The heart is not enlarged. No calcified coronary atherosclerosis.  No pericardial effusion. THORACIC AORTA: The aorta is normal in caliber.  No evidence of aortic dissection, intramural hematoma, or atherosclerotic ulcer. PULMONARY ARTERY: No pulmonary embolus to the segmental level. The main pulmonary artery is normal in caliber. MEDIASTINUM/HILA: No mediastinal mass or lymphadenopathy. CHEST WALL: No mass or axillary lymphadenopathy. LIVER: The liver contour is normal. No suspicious liver lesion. BILIARY TREE: The gallbladder is within normal limits. No biliary dilation. SPLEEN: Normal. PANCREAS: No pancreatic mass or ductal dilation. ADRENALS: Normal. KIDNEYS/BLADDER: The kidneys are symmetric in size. No renal calculus or hydronephrosis. No renal mass. The urinary bladder is unremarkable. BOWEL: The colon is unremarkable. The small bowel is normal in caliber. No bowel wall thickening. PERITONEUM/RETROPERITONEUM: No ascites or free air. No pelvic or retroperitoneal lymphadenopathy. VESSELS: No abdominal aortic aneurysm or dissection. Mild atherosclerotic disease in the aorta. The celiac, SMA, and IMA are patent without significant stenosis. The renal arteries are patent without flow-limiting stenosis. The bilateral iliac arteries are patent without flow-limiting stenosis. ABDOMINAL WALL: No hernia or mass. REPRODUCTIVE: Unremarkable. BONES: No suspicious osseous lesion.     No aortic dissection or aneurysm. Electronically signed by Alycia Rossetti    MRI THORACIC SPINE WO CONTRAST    Result Date: 07/20/2022  EXAMINATION: MRI THORACIC SPINE WO CONTRAST 07/20/2022 5:38 PM ACCESSION NUMBER: [id number] COMPARISON: None available  INDICATION: Evaluate myelopathy TECHNIQUE: Multisequence, multiplanar MR images were obtained of the thoracic spine without the administration of intravenous contrast. FINDINGS: The bone marrow has normal signal characteristics with scattered areas of fatty marrow replacement. Spinal canal is patent. Spinal cord has normal signal. No epidural mass or hematoma. The disc spaces and facet joints are grossly intact throughout the thoracic spine. The surrounding soft tissues are unremarkable.     No acute findings in the thoracic spine. Electronically signed by Alycia Rossetti    XR CHEST PORTABLE    Result Date: 07/20/2022  Chest X-ray INDICATION: Hypotension COMPARISON: November 07, 2018 TECHNIQUE: AP/PA view of the chest was obtained. FINDINGS: The lungs are clear. There are no infiltrates or effusions.  The heart size is normal.  The bony thorax is intact.      No acute findings in the chest Electronically signed by Berdine Addison      CBC:   Recent Labs     07/20/22  1639 07/21/22  0327   WBC 13.4* 11.9*   HGB 11.0* 10.2*   PLT 307 260     BMP:    Recent Labs     07/20/22  1639 07/21/22  0327   NA 136 140   K 5.4* 5.0   CL 102 106   CO2 24 25   BUN 20 17   CREATININE 1.4* 1.1  GLUCOSE 88 104*     Hepatic:   Recent Labs     07/20/22  1639   AST 11*   ALT 11   BILITOT 0.2   ALKPHOS 60     Lipids:   Lab Results   Component Value Date/Time    CHOL 90 06/11/2018 02:19 AM    HDL 56 07/12/2022 04:07 PM    TRIG 158 06/11/2018 02:19 AM     Hemoglobin A1C:   Lab Results   Component Value Date/Time    LABA1C 6.4 07/20/2022 04:39 PM     TSH:   Lab Results   Component Value Date/Time    TSH 1.42 03/12/2017 03:00 PM     Troponin:   Lab Results   Component Value Date/Time    TROPONINT <0.010 11/07/2018 10:19 AM    TROPONINT <0.010 03/18/2018 05:14 PM    TROPONINT <0.010 09/08/2017 06:43 PM     Lactic Acid: No results for input(s): "LACTA" in the last 72 hours.  BNP: No results for input(s): "PROBNP" in the last 72 hours.  UA:  Lab Results    Component Value Date/Time    NITRU NEGATIVE 07/20/2022 08:15 PM    COLORU YELLOW 07/20/2022 08:15 PM    PHUR 5.5 07/20/2022 08:15 PM    WBCUA NO CELLS SEEN 05/10/2019 01:46 PM    RBCUA 6 05/10/2019 01:46 PM    MUCUS RARE 06/10/2018 06:50 PM    TRICHOMONAS NONE SEEN 06/10/2018 06:50 PM    BACTERIA NEGATIVE 05/10/2019 01:46 PM    CLARITYU CLEAR 07/20/2022 08:15 PM    LEUKOCYTESUR NEGATIVE 07/20/2022 08:15 PM    UROBILINOGEN 0.2 07/20/2022 08:15 PM    BILIRUBINUR NEGATIVE 07/20/2022 08:15 PM    BLOODU NEGATIVE 07/20/2022 08:15 PM    GLUCOSEU NEGATIVE 07/20/2022 08:15 PM    KETUA NEGATIVE 07/20/2022 08:15 PM     Urine Cultures: No results found for: "LABURIN"  Blood Cultures: No results found for: "BC"  No results found for: "BLOODCULT2"  Organism: No results found for: "ORG"      Electronically signed by Emily Filbert, MD on 07/21/2022 at 7:28 AM

## 2022-07-21 NOTE — Progress Notes (Signed)
4 Eyes Skin Assessment     NAME:  Ricardo Foley  DATE OF BIRTH:  [birth year]  MEDICAL RECORD NUMBER:  [id number]    The patient is being assessed for  Admission    I agree that at least one RN has performed a thorough Head to Toe Skin Assessment on the patient. ALL assessment sites listed below have been assessed.      Areas assessed by both nurses:    Head, Face, Ears, Shoulders, Back, Chest, Arms, Elbows, Hands, Sacrum. Buttock, Coccyx, Ischium, and Legs. Feet and Heels        Does the Patient have a Wound? No noted wound(s)       Braden Prevention initiated by RN: Yes  Wound Care Orders initiated by RN: No    Pressure Injury (Stage 3,4, Unstageable, DTI, NWPT, and Complex wounds) if present, place Wound referral order by RN under ORDER ENTRY: No    New Ostomies, if present place, Ostomy referral order under ORDER ENTRY: No     Nurse 1 eSignature: Electronically signed by Corine Shelter, RN on 07/21/22 at 5:37 AM EDT    **SHARE this note so that the co-signing nurse can place an eSignature**    Nurse 2 eSignature: Electronically signed by Weston Anna, RN on 07/21/22 at 5:54 AM EDT

## 2022-07-21 NOTE — Care Coordination-Inpatient (Signed)
07/21/22 1619   Service Assessment   Patient Orientation Alert and Oriented   Cognition Alert   History Provided By Medical Record   Primary Caregiver Self   Support Systems Spouse/Significant Other;Children   Patient's Healthcare Decision Maker is: Legal Next of Kin   PCP Verified by CM Yes   Prior Functional Level Assistance with the following:;Mobility;Independent in ADLs/IADLs   Current Functional Level Assistance with the following:;Mobility;Independent in ADLs/IADLs   Can patient return to prior living arrangement Unknown at present   Ability to make needs known: Good   Family able to assist with home care needs: Yes   Would you like for me to discuss the discharge plan with any other family members/significant others, and if so, who? No   Financial Resources [employer] None     Chart reviewed, screened for discharge planning.  Pt from home.  Surgical intervention planned for 7/2.  CM following for needs

## 2022-07-21 NOTE — Progress Notes (Signed)
Neurosurgery Progress Note  07/21/2022 11:51 AM      Assessment and Plan:   Cervical myelopathy-  patient was seen in [facility] yesterday. Ha severe compression C3-5 with multilevel spondylosis. Plan is for surgical intervention on 7/2 if patient is medically cleared. Hold plavix. Can bridge with heparin if necessary.  AKI- cr 1.6 on admission  Elevated troponins- have been stable   DM type 2- A1C  Hypotension    Supervising physician: Sandria Manly IV, MD.  Dr. Luther Redo was readily and continuously available by phone for direct consultation regarding the care of this patient.    Dragon dictation may have been used in the writing of this note. Spelling or word errors may have occurred. Please call for clarification if there are concerns.      Subjective:   Admit Date: 07/20/2022  PCP: Oval Linsey, APRN - CNP    Patient laying in bed. Feels that his unsteadiness has improved now that his blood pressure has normalized.  He understands that we are awaiting medical clearance and can do a decompressive procedure on Tuesday next week.  He has no new complaints at this time.    Data:   Scheduled Meds:   acetaminophen  650 mg Oral 4 times per day    [Held by provider] aspirin  81 mg Oral Daily    atorvastatin  40 mg Oral Nightly    [Held by provider] carvedilol  12.5 mg Oral BID    [Held by provider] clopidogrel  75 mg Oral Daily    DULoxetine  60 mg Oral Daily    ferrous sulfate  325 mg Oral Daily    gabapentin  400 mg Oral TID    lactobacillus  1 capsule Oral Daily    linaclotide  145 mcg Oral Daily    pantoprazole  40 mg Oral BID AC    tamsulosin  0.4 mg Oral Daily    tiZANidine  4 mg Oral TID    insulin lispro  0-4 Units SubCUTAneous TID WC    insulin lispro  0-4 Units SubCUTAneous Nightly    sodium chloride flush  5-40 mL IntraVENous 2 times per day    heparin (porcine)  5,000 Units SubCUTAneous 3 times per day    traZODone  150 mg Oral Nightly         I/O last 3 completed shifts:  In: -   Out: 1720  [Urine:1720]  I/O this shift:  In: 240 [P.O.:240]  Out: 425 [Urine:425]    Intake/Output Summary (Last 24 hours) at 07/21/2022 1151  Last data filed at 07/21/2022 0917  Gross per 24 hour   Intake 240 ml   Output 2145 ml   Net -1905 ml     CULTURE results: Invalid input(s): "BLOOD CULTURE", "URINE CULTURE", "SURGICAL CULTURE"  CBC:   Recent Labs     07/20/22  1639 07/21/22  0327   WBC 13.4* 11.9*   HGB 11.0* 10.2*   PLT 307 260     BMP:    Recent Labs     07/20/22  1639 07/21/22  0327   NA 136 140   K 5.4* 5.0   CL 102 106   CO2 24 25   BUN 20 17   CREATININE 1.4* 1.1   GLUCOSE 88 104*     Hepatic:   Recent Labs     07/20/22  1639   AST 11*   ALT 11   BILITOT 0.2   ALKPHOS 60  IMAGING: available xray, CT, and MRI results reviewed    MRI thoracic wo contrast 07/20/22  IMPRESSION:     No acute findings in the thoracic spine.    CTA chest abd/pelvis 07/20/22  IMPRESSION:     No aortic dissection or aneurysm.    Objective:   Vitals: BP (!) 164/73   Pulse 60   Temp 98 F (36.7 C) (Oral)   Resp 16   Ht 1.676 m (5' 5.98")   Wt 74.4 kg (164 lb)   SpO2 97%   BMI 26.48 kg/m   General appearance: alert, laying in bed, no distress  HEENT: Normocephalic, atraumatic, EOM intact  DERM: No significant rashes or lesions identified  Neurologic: Mental status: Alert and oriented x 4, speech clear and coherent, no facial droop, follows commands briskly, sensation decreased in upper extremities  Extremities: Bilateral upper extremities 5/5, no clonus bilaterally, no Hoffman's, bilateral lower extremities 5/5, no clonus bilaterally  Neurosurgical Incision: none  Drains: none      Electronically signed by Jonetta Speak, PA-C on 07/21/2022 at 11:51 AM

## 2022-07-22 LAB — POCT GLUCOSE
POC Glucose: 131 MG/DL — ABNORMAL HIGH (ref 70–99)
POC Glucose: 141 MG/DL — ABNORMAL HIGH (ref 70–99)
POC Glucose: 123 MG/DL — ABNORMAL HIGH (ref 70–99)
POC Glucose: 174 MG/DL — ABNORMAL HIGH (ref 70–99)

## 2022-07-22 MED FILL — CULTURELLE PO CAPS: ORAL | Qty: 1

## 2022-07-22 MED FILL — TIZANIDINE HCL 4 MG PO TABS: 4 MG | ORAL | Qty: 1

## 2022-07-22 MED FILL — FERROUS SULFATE 325 (65 FE) MG PO TABS: 325 (65 Fe) MG | ORAL | Qty: 1

## 2022-07-22 MED FILL — OXYCODONE HCL 10 MG PO TABS: 10 MG | ORAL | Qty: 1

## 2022-07-22 MED FILL — PANTOPRAZOLE SODIUM 40 MG PO TBEC: 40 MG | ORAL | Qty: 1

## 2022-07-22 MED FILL — HEPARIN SODIUM (PORCINE) 5000 UNIT/ML IJ SOLN: 5000 UNIT/ML | INTRAMUSCULAR | Qty: 1

## 2022-07-22 MED FILL — ACETAMINOPHEN 325 MG PO TABS: 325 MG | ORAL | Qty: 2

## 2022-07-22 MED FILL — GABAPENTIN 400 MG PO CAPS: 400 MG | ORAL | Qty: 1

## 2022-07-22 MED FILL — TRAZODONE HCL 50 MG PO TABS: 50 MG | ORAL | Qty: 3

## 2022-07-22 MED FILL — LIPITOR 40 MG PO TABS: 40 MG | ORAL | Qty: 1

## 2022-07-22 MED FILL — TAMSULOSIN HCL 0.4 MG PO CAPS: 0.4 MG | ORAL | Qty: 1

## 2022-07-22 MED FILL — CYMBALTA 30 MG PO CPEP: 30 MG | ORAL | Qty: 2

## 2022-07-22 NOTE — Progress Notes (Signed)
V2.0    [facility] Progress Note      Name:  Ricardo Foley DOB/Age/Sex: [birth year]  (62 y.o. male)   MRN & CSN:  [id number] & [id number] Encounter Date/Time: 07/22/2022 7:28 AM EDT   Location:  3018/3018-A PCP: Oval Linsey, APRN - CNP     Attending:Murat Rideout, MD       Hospital Day: 3    Assessment and Recommendations       Plan:   Patient is a 62 year old male who presented with neck pain.      # Cervical myelopathy   - Endorsed neck pain with bilateral arm (L>R) and leg numbness. Ambulates with cane, but unsteady gait. No falls or recent injury/trauma. No saddle anesthesia or B/B incontinence. Recent MRI C/L-spine showed severe central canal stenosis of C3-C4 and C4-C5 with DDD and spondylosis. Sent from NSGY clinic for potential decompression.   - MRI T-spine nonacute. CTA nonacute.   - Plan for surgical intervention on 7/2. Continue holding Plavix. Supportive care.      # AKI, mild  - Clinically hypovolemic. Cr 1.4, baseline ~ 0.8. UA negative.  - Will challenge with IVF. Strict I/O's. Bladder scan if decreased voiding. Avoid NSAIDs.     # CAD  # Non-MI troponin elevation  # PVD  - Denied any typical CP. LHC in 07/2017 showed severe dLCx disease, not amenable to PCI.   - Initial Tn mildly elevated, repeat flat. ECG without acute ischemic changes.  - Likely secondary to decreased renal clearance. Hold ASA and Plavix for surgery, continue Lipitor.      # Essential hypertension  - Hold Coreg in setting of borderline hypotension.     # Hyperkalemia  - Mild, <5.6.  - Follow-up repeat labs.     # Hypomagnesemia  - Mild, repleted.  - Follow-up repeat labs.     # Leukocytosis  - Chronic, no fevers, LA normal.   - Monitor labs.      # Elevated lipase  - Denied any typical abdominal pain.   - CT non-acute.   - Not consistent with pancreatitis.      # T2DM with hyperglycemia  - Last A1c 6.4% on admission.   - Held Metformin.  - LCSI.     # Tobacco abuse  - Hx of smoking 1.5 PPD for over 40 years.  - Advised on  importance of complete cessation.  - Nicotine patches prn.      Diet ADULT DIET; Regular   DVT Prophylaxis []  Lovenox, []   Heparin, []  SCDs, []  Ambulation,  []  Eliquis, []  Xarelto  []  Coumadin   Code Status Full Code   Disposition From: Home  Expected Disposition: TBD  Estimated Date of Discharge: TBD  Patient requires continued admission due to surgical intervention on 7/2   Surrogate Decision Maker/ POA       Personally reviewed Lab Studies and Imaging           Subjective:     Chief Complaint:     The patient was seen at bedside. All questions answered at bedside. NAEON. Tolerating diet. No BM last night. Good UOP.        Review of Systems:      Pertinent positives and negatives discussed in HPI    Objective:     Intake/Output Summary (Last 24 hours) at 07/22/2022 1009  Last data filed at 07/22/2022 0612  Gross per 24 hour   Intake 240 ml   Output 3325 ml  Net -3085 ml        Vitals:   Vitals:    07/21/22 2112 07/22/22 0224 07/22/22 0518 07/22/22 0903   BP:  (!) 153/83  (!) 168/65   Pulse:  60  62   Resp: 16  16 18    Temp:  97.8 F (36.6 C)  99.3 F (37.4 C)   TempSrc:  Oral  Oral   SpO2:  94%  96%   Weight:       Height:             Physical Exam:      BMI: There is no height or weight on file to calculate BMI.  General: Awake.     HEENT: PERRLA. Vision grossly intact. Normal hearing. Oropharynx clear.   Neck: Supple. No JVD.   CV: RRR. NL S1/S2. No BLE edema.   Pulm: NL effort on RA. CTAB.   GI: +BS x4. Soft. NT/ND.  GU: No CVA tenderness. No Foley catheter.  Skin: Intact, warm and dry.  MSK: No gross joint deformities. Full ROM.   Neuro: AAOx3. CNs grossly intact. Normal speech. No focal deficit. 5/5 strength. Right hand clonus. Normal sensation.   Psych: Good judgement and reason.        Medications:   Medications:    acetaminophen  650 mg Oral 4 times per day    [Held by provider] aspirin  81 mg Oral Daily    atorvastatin  40 mg Oral Nightly    [Held by provider] carvedilol  12.5 mg Oral BID    [Held by  provider] clopidogrel  75 mg Oral Daily    DULoxetine  60 mg Oral Daily    ferrous sulfate  325 mg Oral Daily    gabapentin  400 mg Oral TID    lactobacillus  1 capsule Oral Daily    linaclotide  145 mcg Oral Daily    pantoprazole  40 mg Oral BID AC    tamsulosin  0.4 mg Oral Daily    tiZANidine  4 mg Oral TID    insulin lispro  0-4 Units SubCUTAneous TID WC    insulin lispro  0-4 Units SubCUTAneous Nightly    sodium chloride flush  5-40 mL IntraVENous 2 times per day    heparin (porcine)  5,000 Units SubCUTAneous 3 times per day    traZODone  150 mg Oral Nightly      Infusions:    dextrose      sodium chloride       PRN Meds: oxyCODONE, 5 mg, Q4H PRN   Or  oxyCODONE, 10 mg, Q4H PRN  albuterol sulfate HFA, 2 puff, Q6H PRN  hydrOXYzine pamoate, 50 mg, TID PRN  glucose, 4 tablet, PRN  dextrose bolus, 125 mL, PRN   Or  dextrose bolus, 250 mL, PRN  glucagon (rDNA), 1 mg, PRN  dextrose, , Continuous PRN  sodium chloride flush, 5-40 mL, PRN  sodium chloride, , PRN  potassium chloride, 10 mEq, PRN  magnesium sulfate, 2,000 mg, PRN  ondansetron, 4 mg, Q8H PRN   Or  ondansetron, 4 mg, Q6H PRN  melatonin, 3 mg, Nightly PRN  polyethylene glycol, 17 g, Daily PRN  nicotine, 1 patch, Daily PRN        Labs and Imaging   CTA CHEST ABDOMEN PELVIS W CONTRAST    Result Date: 07/20/2022  EXAMINATION: CTA CHEST ABDOMEN PELVIS W CONTRAST 07/20/2022 5:44 PM ACCESSION NUMBER: [id number] COMPARISON: None available INDICATION: abdominal pain, back pain, elevaed  d dimer, hypotension TECHNIQUE: Dedicated CT angiographic images were obtained from the lung apices to the symphysis pubis following administration Iso-vue .  Additional delayed images were obtained through the abdomen and pelvis.  Multiplanar reformats and 3-D reprocessing was utilized for optimal assessment of the images. Radiation dose reduction techniques were used for this study. Our CT scanners use one or all of the following: Automated exposure control, adjustment of the  mA and/or kV according to patient size, iterative reconstruction. FINDINGS: AIRWAYS: The central airways are patent. LUNGS: The lungs are clear bilaterally.  No suspicious pulmonary nodules. PLEURA: No pleural effusion or pneumothorax. HEART: The heart is not enlarged. No calcified coronary atherosclerosis.  No pericardial effusion. THORACIC AORTA: The aorta is normal in caliber.  No evidence of aortic dissection, intramural hematoma, or atherosclerotic ulcer. PULMONARY ARTERY: No pulmonary embolus to the segmental level. The main pulmonary artery is normal in caliber. MEDIASTINUM/HILA: No mediastinal mass or lymphadenopathy. CHEST WALL: No mass or axillary lymphadenopathy. LIVER: The liver contour is normal. No suspicious liver lesion. BILIARY TREE: The gallbladder is within normal limits. No biliary dilation. SPLEEN: Normal. PANCREAS: No pancreatic mass or ductal dilation. ADRENALS: Normal. KIDNEYS/BLADDER: The kidneys are symmetric in size. No renal calculus or hydronephrosis. No renal mass. The urinary bladder is unremarkable. BOWEL: The colon is unremarkable. The small bowel is normal in caliber. No bowel wall thickening. PERITONEUM/RETROPERITONEUM: No ascites or free air. No pelvic or retroperitoneal lymphadenopathy. VESSELS: No abdominal aortic aneurysm or dissection. Mild atherosclerotic disease in the aorta. The celiac, SMA, and IMA are patent without significant stenosis. The renal arteries are patent without flow-limiting stenosis. The bilateral iliac arteries are patent without flow-limiting stenosis. ABDOMINAL WALL: No hernia or mass. REPRODUCTIVE: Unremarkable. BONES: No suspicious osseous lesion.     No aortic dissection or aneurysm. Electronically signed by Alycia Rossetti    MRI THORACIC SPINE WO CONTRAST    Result Date: 07/20/2022  EXAMINATION: MRI THORACIC SPINE WO CONTRAST 07/20/2022 5:38 PM ACCESSION NUMBER: [id number] COMPARISON: None available INDICATION: Evaluate myelopathy TECHNIQUE: Multisequence,  multiplanar MR images were obtained of the thoracic spine without the administration of intravenous contrast. FINDINGS: The bone marrow has normal signal characteristics with scattered areas of fatty marrow replacement. Spinal canal is patent. Spinal cord has normal signal. No epidural mass or hematoma. The disc spaces and facet joints are grossly intact throughout the thoracic spine. The surrounding soft tissues are unremarkable.     No acute findings in the thoracic spine. Electronically signed by Alycia Rossetti    XR CHEST PORTABLE    Result Date: 07/20/2022  Chest X-ray INDICATION: Hypotension COMPARISON: November 07, 2018 TECHNIQUE: AP/PA view of the chest was obtained. FINDINGS: The lungs are clear. There are no infiltrates or effusions.  The heart size is normal.  The bony thorax is intact.      No acute findings in the chest Electronically signed by Berdine Addison      CBC:   Recent Labs     07/20/22  1639 07/21/22  0327   WBC 13.4* 11.9*   HGB 11.0* 10.2*   PLT 307 260       BMP:    Recent Labs     07/20/22  1639 07/21/22  0327   NA 136 140   K 5.4* 5.0   CL 102 106   CO2 24 25   BUN 20 17   CREATININE 1.4* 1.1   GLUCOSE 88 104*       Hepatic:  Recent Labs     07/20/22  1639   AST 11*   ALT 11   BILITOT 0.2   ALKPHOS 60       Lipids:   Lab Results   Component Value Date/Time    CHOL 90 06/11/2018 02:19 AM    HDL 56 07/12/2022 04:07 PM    TRIG 158 06/11/2018 02:19 AM     Hemoglobin A1C:   Lab Results   Component Value Date/Time    LABA1C 6.4 07/20/2022 04:39 PM     TSH:   Lab Results   Component Value Date/Time    TSH 1.42 03/12/2017 03:00 PM     Troponin:   Lab Results   Component Value Date/Time    TROPONINT <0.010 11/07/2018 10:19 AM    TROPONINT <0.010 03/18/2018 05:14 PM    TROPONINT <0.010 09/08/2017 06:43 PM     Lactic Acid: No results for input(s): "LACTA" in the last 72 hours.  BNP: No results for input(s): "PROBNP" in the last 72 hours.  UA:  Lab Results   Component Value Date/Time    NITRU NEGATIVE  07/20/2022 08:15 PM    COLORU YELLOW 07/20/2022 08:15 PM    PHUR 5.5 07/20/2022 08:15 PM    WBCUA NO CELLS SEEN 05/10/2019 01:46 PM    RBCUA 6 05/10/2019 01:46 PM    MUCUS RARE 06/10/2018 06:50 PM    TRICHOMONAS NONE SEEN 06/10/2018 06:50 PM    BACTERIA NEGATIVE 05/10/2019 01:46 PM    CLARITYU CLEAR 07/20/2022 08:15 PM    LEUKOCYTESUR NEGATIVE 07/20/2022 08:15 PM    UROBILINOGEN 0.2 07/20/2022 08:15 PM    BILIRUBINUR NEGATIVE 07/20/2022 08:15 PM    BLOODU NEGATIVE 07/20/2022 08:15 PM    GLUCOSEU NEGATIVE 07/20/2022 08:15 PM    KETUA NEGATIVE 07/20/2022 08:15 PM     Urine Cultures: No results found for: "LABURIN"  Blood Cultures: No results found for: "BC"  No results found for: "BLOODCULT2"  Organism: No results found for: "ORG"      Electronically signed by Sandy Salaam, MD on 07/22/2022 at 10:09 AM

## 2022-07-22 NOTE — Plan of Care (Signed)
Problem: Discharge Planning  Goal: Discharge to home or other facility with appropriate resources  07/22/2022 1104 by Darden Palmer, RN  Outcome: Progressing  07/22/2022 0352 by Concha Pyo, RN  Outcome: Progressing     Problem: Safety - Adult  Goal: Free from fall injury  07/22/2022 1104 by Darden Palmer, RN  Outcome: Progressing  07/22/2022 0352 by Concha Pyo, RN  Outcome: Progressing     Problem: Musculoskeletal - Adult  Goal: Return mobility to safest level of function  07/22/2022 1104 by Darden Palmer, RN  Outcome: Progressing  07/22/2022 0352 by Concha Pyo, RN  Outcome: Progressing  Goal: Return ADL status to a safe level of function  07/22/2022 1104 by Darden Palmer, RN  Outcome: Progressing  07/22/2022 0352 by Concha Pyo, RN  Outcome: Progressing     Problem: Metabolic/Fluid and Electrolytes - Adult  Goal: Electrolytes maintained within normal limits  07/22/2022 1104 by Darden Palmer, RN  Outcome: Progressing  07/22/2022 0352 by Concha Pyo, RN  Outcome: Progressing  Goal: Glucose maintained within prescribed range  07/22/2022 1104 by Darden Palmer, RN  Outcome: Progressing  07/22/2022 0352 by Concha Pyo, RN  Outcome: Progressing     Problem: Chronic Conditions and Co-morbidities  Goal: Patient's chronic conditions and co-morbidity symptoms are monitored and maintained or improved  07/22/2022 1104 by Darden Palmer, RN  Outcome: Progressing  07/22/2022 0352 by Concha Pyo, RN  Outcome: Progressing     Problem: Pain  Goal: Verbalizes/displays adequate comfort level or baseline comfort level  Outcome: Progressing

## 2022-07-22 NOTE — Plan of Care (Signed)
Problem: Discharge Planning  Goal: Discharge to home or other facility with appropriate resources  07/22/2022 2317 by Jason Fila, RN  Outcome: Progressing  Flowsheets (Taken 07/22/2022 2007)  Discharge to home or other facility with appropriate resources:   Identify barriers to discharge with patient and caregiver   Arrange for needed discharge resources and transportation as appropriate   Identify discharge learning needs (meds, wound care, etc)  07/22/2022 1104 by Rhein, Reather Converse, RN  Outcome: Progressing     Problem: Safety - Adult  Goal: Free from fall injury  07/22/2022 2317 by Jason Fila, RN  Outcome: Progressing  07/22/2022 1104 by Darden Palmer, RN  Outcome: Progressing     Problem: Musculoskeletal - Adult  Goal: Return mobility to safest level of function  07/22/2022 2317 by Jason Fila, RN  Outcome: Progressing  Flowsheets (Taken 07/22/2022 2007)  Return Mobility to Safest Level of Function:   Assess patient stability and activity tolerance for standing, transferring and ambulating with or without assistive devices   Assist with transfers and ambulation using safe patient handling equipment as needed   Ensure adequate protection for wounds/incisions during mobilization  07/22/2022 1104 by Rhein, Reather Converse, RN  Outcome: Progressing  Goal: Return ADL status to a safe level of function  07/22/2022 2317 by Jason Fila, RN  Outcome: Progressing  Flowsheets (Taken 07/22/2022 2007)  Return ADL Status to a Safe Level of Function:   Administer medication as ordered   Assess activities of daily living deficits and provide assistive devices as needed   Obtain physical therapy/occupational therapy consults as needed   Assist and instruct patient to increase activity and self care as tolerated  07/22/2022 1104 by Rhein, Reather Converse, RN  Outcome: Progressing     Problem: Metabolic/Fluid and Electrolytes - Adult  Goal: Electrolytes maintained within normal limits  07/22/2022 2317 by Jason Fila, RN  Outcome:  Progressing  Flowsheets (Taken 07/22/2022 2007)  Electrolytes maintained within normal limits:   Monitor labs and assess patient for signs and symptoms of electrolyte imbalances   Administer electrolyte replacement as ordered   Monitor response to electrolyte replacements, including repeat lab results as appropriate  07/22/2022 1104 by Rhein, Reather Converse, RN  Outcome: Progressing  Goal: Glucose maintained within prescribed range  07/22/2022 2317 by Jason Fila, RN  Outcome: Progressing  Flowsheets (Taken 07/22/2022 2007)  Glucose maintained within prescribed range:   Monitor blood glucose as ordered   Assess for signs and symptoms of hyperglycemia and hypoglycemia  07/22/2022 1104 by Rhein, Reather Converse, RN  Outcome: Progressing     Problem: Chronic Conditions and Co-morbidities  Goal: Patient's chronic conditions and co-morbidity symptoms are monitored and maintained or improved  07/22/2022 2317 by Jason Fila, RN  Outcome: Progressing  Flowsheets (Taken 07/22/2022 2007)  Care Plan - Patient's Chronic Conditions and Co-Morbidity Symptoms are Monitored and Maintained or Improved:   Monitor and assess patient's chronic conditions and comorbid symptoms for stability, deterioration, or improvement   Collaborate with multidisciplinary team to address chronic and comorbid conditions and prevent exacerbation or deterioration   Update acute care plan with appropriate goals if chronic or comorbid symptoms are exacerbated and prevent overall improvement and discharge  07/22/2022 1104 by Rhein, Reather Converse, RN  Outcome: Progressing     Problem: Pain  Goal: Verbalizes/displays adequate comfort level or baseline comfort level  07/22/2022 2317 by Jason Fila, RN  Outcome: Progressing  Flowsheets (Taken 07/22/2022 2007)  Verbalizes/displays adequate comfort level or baseline comfort level:  Encourage patient to monitor pain and request assistance   Administer analgesics based on type and severity of pain and evaluate response   Assess  pain using appropriate pain scale   Implement non-pharmacological measures as appropriate and evaluate response   Consider cultural and social influences on pain and pain management   Notify Licensed Independent Practitioner if interventions unsuccessful or patient reports new pain  07/22/2022 1104 by Rhein, Reather Converse, RN  Outcome: Progressing

## 2022-07-23 LAB — POCT GLUCOSE
POC Glucose: 169 MG/DL — ABNORMAL HIGH (ref 70–99)
POC Glucose: 230 MG/DL — ABNORMAL HIGH (ref 70–99)
POC Glucose: 122 MG/DL — ABNORMAL HIGH (ref 70–99)
POC Glucose: 140 MG/DL — ABNORMAL HIGH (ref 70–99)

## 2022-07-23 MED FILL — GABAPENTIN 400 MG PO CAPS: 400 MG | ORAL | Qty: 1

## 2022-07-23 MED FILL — CULTURELLE PO CAPS: ORAL | Qty: 1

## 2022-07-23 MED FILL — ACETAMINOPHEN 325 MG PO TABS: 325 MG | ORAL | Qty: 2

## 2022-07-23 MED FILL — FERROUS SULFATE 325 (65 FE) MG PO TABS: 325 (65 Fe) MG | ORAL | Qty: 1

## 2022-07-23 MED FILL — OXYCODONE HCL 10 MG PO TABS: 10 MG | ORAL | Qty: 1

## 2022-07-23 MED FILL — TIZANIDINE HCL 4 MG PO TABS: 4 MG | ORAL | Qty: 1

## 2022-07-23 MED FILL — HEPARIN SODIUM (PORCINE) 5000 UNIT/ML IJ SOLN: 5000 UNIT/ML | INTRAMUSCULAR | Qty: 1

## 2022-07-23 MED FILL — LIPITOR 40 MG PO TABS: 40 MG | ORAL | Qty: 1

## 2022-07-23 MED FILL — CYMBALTA 30 MG PO CPEP: 30 MG | ORAL | Qty: 2

## 2022-07-23 MED FILL — PANTOPRAZOLE SODIUM 40 MG PO TBEC: 40 MG | ORAL | Qty: 1

## 2022-07-23 MED FILL — TRAZODONE HCL 50 MG PO TABS: 50 MG | ORAL | Qty: 3

## 2022-07-23 MED FILL — TAMSULOSIN HCL 0.4 MG PO CAPS: 0.4 MG | ORAL | Qty: 1

## 2022-07-23 NOTE — Progress Notes (Signed)
V2.0    [facility] Progress Note      Name:  Ricardo Foley DOB/Age/Sex: 30-Mar-1960  (62 y.o. male)   MRN & CSN:  [id number] & [id number] Encounter Date/Time: 07/23/2022 7:28 AM EDT   Location:  3018/3018-A PCP: Oval Linsey, APRN - CNP     Attending:Jamarr Treinen, MD       Hospital Day: 4    Assessment and Recommendations       Plan:   Patient is a 62 year old male who presented with neck pain.      # Cervical myelopathy   - Endorsed neck pain with bilateral arm (L>R) and leg numbness. Ambulates with cane, but unsteady gait. No falls or recent injury/trauma. No saddle anesthesia or B/B incontinence. Recent MRI C/L-spine showed severe central canal stenosis of C3-C4 and C4-C5 with DDD and spondylosis. Sent from NSGY clinic for potential decompression.   - MRI T-spine nonacute. CTA nonacute.   - Plan for surgical intervention on 7/2. Continue holding Plavix. Supportive care.      # AKI, mild  - Clinically hypovolemic. Cr 1.4, baseline ~ 0.8. UA negative.  - Will challenge with IVF. Strict I/O's. Bladder scan if decreased voiding. Avoid NSAIDs.     # CAD  # Non-MI troponin elevation  # PVD  - Denied any typical CP. LHC in 07/2017 showed severe dLCx disease, not amenable to PCI.   - Initial Tn mildly elevated, repeat flat. ECG without acute ischemic changes.  - Likely secondary to decreased renal clearance. Hold ASA and Plavix for surgery, continue Lipitor.      # Essential hypertension  - Hold Coreg in setting of borderline hypotension.     # Hyperkalemia  - Mild, <5.6.  - Follow-up repeat labs.     # Hypomagnesemia  - Mild, repleted.  - Follow-up repeat labs.     # Leukocytosis  - Chronic, no fevers, LA normal.   - Monitor labs.      # Elevated lipase  - Denied any typical abdominal pain.   - CT non-acute.   - Not consistent with pancreatitis.      # T2DM with hyperglycemia  - Last A1c 6.4% on admission.   - Held Metformin.  - LCSI.     # Tobacco abuse  - Hx of smoking 1.5 PPD for over 40 years.  - Advised on  importance of complete cessation.  - Nicotine patches prn.      Diet ADULT DIET; Regular   DVT Prophylaxis []  Lovenox, []   Heparin, []  SCDs, []  Ambulation,  []  Eliquis, []  Xarelto  []  Coumadin   Code Status Full Code   Disposition From: Home  Expected Disposition: TBD  Estimated Date of Discharge: TBD  Patient requires continued admission due to surgical intervention on 7/2   Surrogate Decision Maker/ POA       Personally reviewed Lab Studies and Imaging           Subjective:     Chief Complaint:     The patient was seen at bedside. All questions answered at bedside. NAEON. Tolerating diet. No BM last night. Good UOP.        Review of Systems:      Pertinent positives and negatives discussed in HPI    Objective:     Intake/Output Summary (Last 24 hours) at 07/23/2022 1109  Last data filed at 07/23/2022 0600  Gross per 24 hour   Intake 720 ml   Output 2650 ml  Net -1930 ml        Vitals:   Vitals:    07/23/22 0336 07/23/22 0406 07/23/22 0600 07/23/22 0945   BP:    (!) 158/79   Pulse:    71   Resp: 20 16  18    Temp:    98 F (36.7 C)   TempSrc:    Oral   SpO2:    95%   Weight:   75.6 kg (166 lb 10.7 oz)    Height:             Physical Exam:      BMI: There is no height or weight on file to calculate BMI.  General: Awake.     HEENT: PERRLA. Vision grossly intact. Normal hearing. Oropharynx clear.   Neck: Supple. No JVD.   CV: RRR. NL S1/S2. No BLE edema.   Pulm: NL effort on RA. CTAB.   GI: +BS x4. Soft. NT/ND.  GU: No CVA tenderness. No Foley catheter.  Skin: Intact, warm and dry.  MSK: No gross joint deformities. Full ROM.   Neuro: AAOx3. CNs grossly intact. Normal speech. No focal deficit. 5/5 strength. Right hand clonus. Normal sensation.   Psych: Good judgement and reason.        Medications:   Medications:    acetaminophen  650 mg Oral 4 times per day    [Held by provider] aspirin  81 mg Oral Daily    atorvastatin  40 mg Oral Nightly    [Held by provider] carvedilol  12.5 mg Oral BID    [Held by provider]  clopidogrel  75 mg Oral Daily    DULoxetine  60 mg Oral Daily    ferrous sulfate  325 mg Oral Daily    gabapentin  400 mg Oral TID    lactobacillus  1 capsule Oral Daily    linaclotide  145 mcg Oral Daily    pantoprazole  40 mg Oral BID AC    tamsulosin  0.4 mg Oral Daily    tiZANidine  4 mg Oral TID    insulin lispro  0-4 Units SubCUTAneous TID WC    insulin lispro  0-4 Units SubCUTAneous Nightly    sodium chloride flush  5-40 mL IntraVENous 2 times per day    heparin (porcine)  5,000 Units SubCUTAneous 3 times per day    traZODone  150 mg Oral Nightly      Infusions:    dextrose      sodium chloride       PRN Meds: oxyCODONE, 5 mg, Q4H PRN   Or  oxyCODONE, 10 mg, Q4H PRN  albuterol sulfate HFA, 2 puff, Q6H PRN  hydrOXYzine pamoate, 50 mg, TID PRN  glucose, 4 tablet, PRN  dextrose bolus, 125 mL, PRN   Or  dextrose bolus, 250 mL, PRN  glucagon (rDNA), 1 mg, PRN  dextrose, , Continuous PRN  sodium chloride flush, 5-40 mL, PRN  sodium chloride, , PRN  potassium chloride, 10 mEq, PRN  magnesium sulfate, 2,000 mg, PRN  ondansetron, 4 mg, Q8H PRN   Or  ondansetron, 4 mg, Q6H PRN  melatonin, 3 mg, Nightly PRN  polyethylene glycol, 17 g, Daily PRN  nicotine, 1 patch, Daily PRN        Labs and Imaging   CTA CHEST ABDOMEN PELVIS W CONTRAST    Result Date: 07/20/2022  EXAMINATION: CTA CHEST ABDOMEN PELVIS W CONTRAST 07/20/2022 5:44 PM ACCESSION NUMBER: [id number] COMPARISON: None available INDICATION: abdominal pain, back pain,  elevaed d dimer, hypotension TECHNIQUE: Dedicated CT angiographic images were obtained from the lung apices to the symphysis pubis following administration Iso-vue .  Additional delayed images were obtained through the abdomen and pelvis.  Multiplanar reformats and 3-D reprocessing was utilized for optimal assessment of the images. Radiation dose reduction techniques were used for this study. Our CT scanners use one or all of the following: Automated exposure control, adjustment of the mA and/or  kV according to patient size, iterative reconstruction. FINDINGS: AIRWAYS: The central airways are patent. LUNGS: The lungs are clear bilaterally.  No suspicious pulmonary nodules. PLEURA: No pleural effusion or pneumothorax. HEART: The heart is not enlarged. No calcified coronary atherosclerosis.  No pericardial effusion. THORACIC AORTA: The aorta is normal in caliber.  No evidence of aortic dissection, intramural hematoma, or atherosclerotic ulcer. PULMONARY ARTERY: No pulmonary embolus to the segmental level. The main pulmonary artery is normal in caliber. MEDIASTINUM/HILA: No mediastinal mass or lymphadenopathy. CHEST WALL: No mass or axillary lymphadenopathy. LIVER: The liver contour is normal. No suspicious liver lesion. BILIARY TREE: The gallbladder is within normal limits. No biliary dilation. SPLEEN: Normal. PANCREAS: No pancreatic mass or ductal dilation. ADRENALS: Normal. KIDNEYS/BLADDER: The kidneys are symmetric in size. No renal calculus or hydronephrosis. No renal mass. The urinary bladder is unremarkable. BOWEL: The colon is unremarkable. The small bowel is normal in caliber. No bowel wall thickening. PERITONEUM/RETROPERITONEUM: No ascites or free air. No pelvic or retroperitoneal lymphadenopathy. VESSELS: No abdominal aortic aneurysm or dissection. Mild atherosclerotic disease in the aorta. The celiac, SMA, and IMA are patent without significant stenosis. The renal arteries are patent without flow-limiting stenosis. The bilateral iliac arteries are patent without flow-limiting stenosis. ABDOMINAL WALL: No hernia or mass. REPRODUCTIVE: Unremarkable. BONES: No suspicious osseous lesion.     No aortic dissection or aneurysm. Electronically signed by Alycia Rossetti    MRI THORACIC SPINE WO CONTRAST    Result Date: 07/20/2022  EXAMINATION: MRI THORACIC SPINE WO CONTRAST 07/20/2022 5:38 PM ACCESSION NUMBER: [id number] COMPARISON: None available INDICATION: Evaluate myelopathy TECHNIQUE: Multisequence,  multiplanar MR images were obtained of the thoracic spine without the administration of intravenous contrast. FINDINGS: The bone marrow has normal signal characteristics with scattered areas of fatty marrow replacement. Spinal canal is patent. Spinal cord has normal signal. No epidural mass or hematoma. The disc spaces and facet joints are grossly intact throughout the thoracic spine. The surrounding soft tissues are unremarkable.     No acute findings in the thoracic spine. Electronically signed by Alycia Rossetti    XR CHEST PORTABLE    Result Date: 07/20/2022  Chest X-ray INDICATION: Hypotension COMPARISON: November 07, 2018 TECHNIQUE: AP/PA view of the chest was obtained. FINDINGS: The lungs are clear. There are no infiltrates or effusions.  The heart size is normal.  The bony thorax is intact.      No acute findings in the chest Electronically signed by Berdine Addison      CBC:   Recent Labs     07/20/22  1639 07/21/22  0327   WBC 13.4* 11.9*   HGB 11.0* 10.2*   PLT 307 260       BMP:    Recent Labs     07/20/22  1639 07/21/22  0327   NA 136 140   K 5.4* 5.0   CL 102 106   CO2 24 25   BUN 20 17   CREATININE 1.4* 1.1   GLUCOSE 88 104*       Hepatic:  Recent Labs     07/20/22  1639   AST 11*   ALT 11   BILITOT 0.2   ALKPHOS 60       Lipids:   Lab Results   Component Value Date/Time    CHOL 90 06/11/2018 02:19 AM    HDL 56 07/12/2022 04:07 PM    TRIG 158 06/11/2018 02:19 AM     Hemoglobin A1C:   Lab Results   Component Value Date/Time    LABA1C 6.4 07/20/2022 04:39 PM     TSH:   Lab Results   Component Value Date/Time    TSH 1.42 03/12/2017 03:00 PM     Troponin:   Lab Results   Component Value Date/Time    TROPONINT <0.010 11/07/2018 10:19 AM    TROPONINT <0.010 03/18/2018 05:14 PM    TROPONINT <0.010 09/08/2017 06:43 PM     Lactic Acid: No results for input(s): "LACTA" in the last 72 hours.  BNP: No results for input(s): "PROBNP" in the last 72 hours.  UA:  Lab Results   Component Value Date/Time    NITRU NEGATIVE  07/20/2022 08:15 PM    COLORU YELLOW 07/20/2022 08:15 PM    PHUR 5.5 07/20/2022 08:15 PM    WBCUA NO CELLS SEEN 05/10/2019 01:46 PM    RBCUA 6 05/10/2019 01:46 PM    MUCUS RARE 06/10/2018 06:50 PM    TRICHOMONAS NONE SEEN 06/10/2018 06:50 PM    BACTERIA NEGATIVE 05/10/2019 01:46 PM    CLARITYU CLEAR 07/20/2022 08:15 PM    LEUKOCYTESUR NEGATIVE 07/20/2022 08:15 PM    UROBILINOGEN 0.2 07/20/2022 08:15 PM    BILIRUBINUR NEGATIVE 07/20/2022 08:15 PM    BLOODU NEGATIVE 07/20/2022 08:15 PM    GLUCOSEU NEGATIVE 07/20/2022 08:15 PM    KETUA NEGATIVE 07/20/2022 08:15 PM     Urine Cultures: No results found for: "LABURIN"  Blood Cultures: No results found for: "BC"  No results found for: "BLOODCULT2"  Organism: No results found for: "ORG"      Electronically signed by Sandy Salaam, MD on 07/23/2022 at 11:09 AM

## 2022-07-24 LAB — POCT GLUCOSE
POC Glucose: 172 MG/DL — ABNORMAL HIGH (ref 70–99)
POC Glucose: 160 MG/DL — ABNORMAL HIGH (ref 70–99)
POC Glucose: 221 MG/DL — ABNORMAL HIGH (ref 70–99)
POC Glucose: 159 MG/DL — ABNORMAL HIGH (ref 70–99)
POC Glucose: 195 MG/DL — ABNORMAL HIGH (ref 70–99)

## 2022-07-24 LAB — URINALYSIS WITH REFLEX TO CULTURE
Bilirubin, Urine: NEGATIVE MG/DL
Blood, Urine: NEGATIVE
Glucose, Ur: NEGATIVE MG/DL
Ketones, Urine: NEGATIVE MG/DL
Leukocyte Esterase, Urine: NEGATIVE
Nitrite Urine, Quantitative: NEGATIVE
Protein, UA: NEGATIVE MG/DL
Specific Gravity, UA: <1.005 (ref 1.001–1.035)
Urobilinogen, Urine: 0.2 MG/DL (ref 0.2–1.0)
pH, Urine: 6.5 (ref 5.0–8.0)

## 2022-07-24 MED FILL — MELATONIN 3 MG PO TABS: 3 MG | ORAL | Qty: 1

## 2022-07-24 MED FILL — HEPARIN SODIUM (PORCINE) 5000 UNIT/ML IJ SOLN: 5000 UNIT/ML | INTRAMUSCULAR | Qty: 1

## 2022-07-24 MED FILL — GABAPENTIN 400 MG PO CAPS: 400 MG | ORAL | Qty: 1

## 2022-07-24 MED FILL — ACETAMINOPHEN 325 MG PO TABS: 325 MG | ORAL | Qty: 2

## 2022-07-24 MED FILL — CYMBALTA 30 MG PO CPEP: 30 MG | ORAL | Qty: 2

## 2022-07-24 MED FILL — LIPITOR 40 MG PO TABS: 40 MG | ORAL | Qty: 1

## 2022-07-24 MED FILL — PANTOPRAZOLE SODIUM 40 MG PO TBEC: 40 MG | ORAL | Qty: 1

## 2022-07-24 MED FILL — TIZANIDINE HCL 4 MG PO TABS: 4 MG | ORAL | Qty: 1

## 2022-07-24 MED FILL — TAMSULOSIN HCL 0.4 MG PO CAPS: 0.4 MG | ORAL | Qty: 1

## 2022-07-24 MED FILL — FERROUS SULFATE 325 (65 FE) MG PO TABS: 325 (65 Fe) MG | ORAL | Qty: 1

## 2022-07-24 MED FILL — TRAZODONE HCL 50 MG PO TABS: 50 MG | ORAL | Qty: 3

## 2022-07-24 MED FILL — OXYCODONE HCL 10 MG PO TABS: 10 MG | ORAL | Qty: 1

## 2022-07-24 MED FILL — CULTURELLE PO CAPS: ORAL | Qty: 1

## 2022-07-24 NOTE — Progress Notes (Signed)
07/24/22 1219   Encounter Summary   Encounter Overview/Reason Initial Encounter   Service Provided For Patient and family together   Referral/Consult From Rounding   Support System Family members   Last Encounter  07/24/22  (PT dealing with neck pain, upcoming surgery this week, hopeful it will make a difference, has support of his wife, feels grateful for the staff/care, he wanted prayer.)   Complexity of Encounter Low   Begin Time 1205   End Time  1221   Total Time Calculated 16 min   Spiritual/Emotional needs   Type Spiritual Support   Grief, Loss, and Adjustments   Type Adjustment to illness   Assessment/Intervention/Outcome   Assessment Calm;Concerns with suffering;Coping;Hopeful   Intervention Active listening;Discussed illness injury and it's impact;Discussed relationship with God;Prayer (assurance of)/Blessing;Sustaining Presence/Ministry of presence   Outcome Comfort;Coping;Expressed feelings, needs, and concerns;Expressed Gratitude   Plan and Referrals   Plan/Referrals Continue Support (comment)

## 2022-07-24 NOTE — Plan of Care (Signed)
Problem: Discharge Planning  Goal: Discharge to home or other facility with appropriate resources  Outcome: Progressing     Problem: Safety - Adult  Goal: Free from fall injury  Outcome: Progressing     Problem: Musculoskeletal - Adult  Goal: Return mobility to safest level of function  Outcome: Progressing  Goal: Return ADL status to a safe level of function  Outcome: Progressing     Problem: Metabolic/Fluid and Electrolytes - Adult  Goal: Electrolytes maintained within normal limits  Outcome: Progressing  Goal: Glucose maintained within prescribed range  Outcome: Progressing     Problem: Chronic Conditions and Co-morbidities  Goal: Patient's chronic conditions and co-morbidity symptoms are monitored and maintained or improved  Outcome: Progressing     Problem: Pain  Goal: Verbalizes/displays adequate comfort level or baseline comfort level  Outcome: Progressing

## 2022-07-24 NOTE — Progress Notes (Signed)
Neurosurgery Progress Note  07/24/2022 9:16 AM      POD:     Assessment and Plan:   Cervical myelopathy  Patient to be schedule for surgery Wednesday.  He will be day 6 off plavix that day.  Literature recommends 5-7 days.  He is appearing better as far as his comorbidities.  He was hypotensive on arrival to the ER previously.    I did talk to him about anterior cervical discectomy and fusion at C3/4, C4/5, C5/6 due to his cervical myelopathy.  He had worsened acutely and presented to my clinic in a wheelchair with frequent falls.  Patient was advised of the risks of surgery which include but are not limited to infection, pain, stiffness, nerve damage, damage to surrounding areas, epidural hematoma, pseudoarthrosis, adjacent segment disease, need for further surgery.  Patient was amenable to moving forward with surgery.        Subjective:   Admit Date: 07/20/2022  PCP: Oval Linsey, APRN - CNP    Patient states he is doing well and ready for surgery.    Data:   Scheduled Meds:   acetaminophen  650 mg Oral 4 times per day    [Held by provider] aspirin  81 mg Oral Daily    atorvastatin  40 mg Oral Nightly    [Held by provider] carvedilol  12.5 mg Oral BID    [Held by provider] clopidogrel  75 mg Oral Daily    DULoxetine  60 mg Oral Daily    ferrous sulfate  325 mg Oral Daily    gabapentin  400 mg Oral TID    lactobacillus  1 capsule Oral Daily    linaclotide  145 mcg Oral Daily    pantoprazole  40 mg Oral BID AC    tamsulosin  0.4 mg Oral Daily    tiZANidine  4 mg Oral TID    insulin lispro  0-4 Units SubCUTAneous TID WC    insulin lispro  0-4 Units SubCUTAneous Nightly    sodium chloride flush  5-40 mL IntraVENous 2 times per day    heparin (porcine)  5,000 Units SubCUTAneous 3 times per day    traZODone  150 mg Oral Nightly         I/O last 3 completed shifts:  In: 490 [P.O.:480; I.V.:10]  Out: 2650 [Urine:2650]  I/O this shift:  In: -   Out: 300 [Urine:300]    Intake/Output Summary (Last 24 hours) at 07/24/2022  0916  Last data filed at 07/24/2022 0715  Gross per 24 hour   Intake 10 ml   Output 1750 ml   Net -1740 ml     CULTURE results: Invalid input(s): "BLOOD CULTURE", "URINE CULTURE", "SURGICAL CULTURE"  CBC: No results for input(s): "WBC", "HGB", "PLT" in the last 72 hours.  BMP:  No results for input(s): "NA", "K", "CL", "CO2", "BUN", "CREATININE", "GLUCOSE" in the last 72 hours.  Hepatic: No results for input(s): "AST", "ALT", "BILITOT", "ALKPHOS" in the last 72 hours.    Invalid input(s): "ALB"      IMAGING: available xray, CT, and MRI results reviewed    Objective:   Vitals: BP 131/86   Pulse 60   Temp 98.5 F (36.9 C) (Oral)   Resp 15   Ht 1.676 m (5' 5.98")   Wt 71.4 kg (157 lb 6.5 oz)   SpO2 97%   BMI 25.42 kg/m   General appearance:   HEENT: Head: Normocephalic, no lesions, without obvious abnormality.  DERM: normal  Abdomen: soft, non-tender; bowel sounds normal; no masses,  no organomegaly  Neurologic: Mental status: Alert, oriented, thought content appropriate   Extremities: extremities normal, atraumatic, no cyanosis or edema; spine: 5/5 strength C5-T1, bilateral hoffmans, abnormal tandem gait, abnormal romberg        Electronically signed by Sandria Manly, MD on 07/24/2022 at 9:16 AM

## 2022-07-24 NOTE — Progress Notes (Signed)
V2.0    [facility] Progress Note      Name:  Ricardo Foley DOB/Age/Sex: [birth year]  (62 y.o. male)   MRN & CSN:  [id number] & [id number] Encounter Date/Time: 07/24/2022 7:28 AM EDT   Location:  3018/3018-A PCP: Oval Linsey, APRN - CNP     Attending:Halana Deisher, MD       Hospital Day: 5    Assessment and Recommendations       Plan:   Patient is a 62 year old male who presented with neck pain.      # Cervical myelopathy   - Endorsed neck pain with bilateral arm (L>R) and leg numbness. Ambulates with cane, but unsteady gait. No falls or recent injury/trauma. No saddle anesthesia or B/B incontinence. Recent MRI C/L-spine showed severe central canal stenosis of C3-C4 and C4-C5 with DDD and spondylosis. Sent from NSGY clinic for potential decompression.   - MRI T-spine nonacute. CTA nonacute.   - Plan for surgical intervention on 7/2. Continue holding Plavix. Supportive care.      # AKI, mild  - Clinically hypovolemic. Cr 1.4, baseline ~ 0.8. UA negative.  - Will challenge with IVF. Strict I/O's. Bladder scan if decreased voiding. Avoid NSAIDs.     # CAD  # Non-MI troponin elevation  # PVD  - Denied any typical CP. LHC in 07/2017 showed severe dLCx disease, not amenable to PCI.   - Initial Tn mildly elevated, repeat flat. ECG without acute ischemic changes.  - Likely secondary to decreased renal clearance. Hold ASA and Plavix for surgery, continue Lipitor.      # Essential hypertension  - Hold Coreg in setting of borderline hypotension.     # Hyperkalemia  - Mild, <5.6.  - Follow-up repeat labs.     # Hypomagnesemia  - Mild, repleted.  - Follow-up repeat labs.     # Leukocytosis  - Chronic, no fevers, LA normal.   - Monitor labs.      # Elevated lipase  - Denied any typical abdominal pain.   - CT non-acute.   - Not consistent with pancreatitis.      # T2DM with hyperglycemia  - Last A1c 6.4% on admission.   - Held Metformin.  - LCSI.     # Tobacco abuse  - Hx of smoking 1.5 PPD for over 40 years.  - Advised on  importance of complete cessation.  - Nicotine patches prn.    Diet ADULT DIET; Regular  Diet NPO Exceptions are: Ice Chips, Sips of Water with Meds   DVT Prophylaxis []  Lovenox, []   Heparin, []  SCDs, []  Ambulation,  []  Eliquis, []  Xarelto  []  Coumadin   Code Status Full Code   Disposition From: Home  Expected Disposition: TBD  Estimated Date of Discharge: TBD  Patient requires continued admission due to surgical intervention on 7/2   Surrogate Decision Maker/ POA       Personally reviewed Lab Studies and Imaging           Subjective:     Chief Complaint:     The patient was seen at bedside. All questions answered at bedside. NAEON. Tolerating diet. No BM last night. Good UOP.    Review of Systems:      Pertinent positives and negatives discussed in HPI    Objective:     Intake/Output Summary (Last 24 hours) at 07/24/2022 1004  Last data filed at 07/24/2022 0715  Gross per 24 hour   Intake 10 ml  Output 1350 ml   Net -1340 ml        Vitals:   Vitals:    07/24/22 0351 07/24/22 0421 07/24/22 0600 07/24/22 0831   BP:    131/86   Pulse:       Resp: 16 14  15    Temp:    98.5 F (36.9 C)   TempSrc:    Oral   SpO2:    97%   Weight:   71.4 kg (157 lb 6.5 oz)    Height:             Physical Exam:      BMI: There is no height or weight on file to calculate BMI.  General: Awake.     HEENT: PERRLA. Vision grossly intact. Normal hearing. Oropharynx clear.   Neck: Supple. No JVD.   CV: RRR. NL S1/S2. No BLE edema.   Pulm: NL effort on RA. CTAB.   GI: +BS x4. Soft. NT/ND.  GU: No CVA tenderness. No Foley catheter.  Skin: Intact, warm and dry.  MSK: No gross joint deformities. Full ROM.   Neuro: AAOx3. CNs grossly intact. Normal speech. No focal deficit. 5/5 strength. Right hand clonus. Normal sensation.   Psych: Good judgement and reason.        Medications:   Medications:    acetaminophen  650 mg Oral 4 times per day    [Held by provider] aspirin  81 mg Oral Daily    atorvastatin  40 mg Oral Nightly    [Held by provider] carvedilol   12.5 mg Oral BID    [Held by provider] clopidogrel  75 mg Oral Daily    DULoxetine  60 mg Oral Daily    ferrous sulfate  325 mg Oral Daily    gabapentin  400 mg Oral TID    lactobacillus  1 capsule Oral Daily    linaclotide  145 mcg Oral Daily    pantoprazole  40 mg Oral BID AC    tamsulosin  0.4 mg Oral Daily    tiZANidine  4 mg Oral TID    insulin lispro  0-4 Units SubCUTAneous TID WC    insulin lispro  0-4 Units SubCUTAneous Nightly    sodium chloride flush  5-40 mL IntraVENous 2 times per day    heparin (porcine)  5,000 Units SubCUTAneous 3 times per day    traZODone  150 mg Oral Nightly      Infusions:    dextrose      sodium chloride       PRN Meds: oxyCODONE, 5 mg, Q4H PRN   Or  oxyCODONE, 10 mg, Q4H PRN  albuterol sulfate HFA, 2 puff, Q6H PRN  hydrOXYzine pamoate, 50 mg, TID PRN  glucose, 4 tablet, PRN  dextrose bolus, 125 mL, PRN   Or  dextrose bolus, 250 mL, PRN  glucagon (rDNA), 1 mg, PRN  dextrose, , Continuous PRN  sodium chloride flush, 5-40 mL, PRN  sodium chloride, , PRN  potassium chloride, 10 mEq, PRN  magnesium sulfate, 2,000 mg, PRN  ondansetron, 4 mg, Q8H PRN   Or  ondansetron, 4 mg, Q6H PRN  melatonin, 3 mg, Nightly PRN  polyethylene glycol, 17 g, Daily PRN  nicotine, 1 patch, Daily PRN        Labs and Imaging   CTA CHEST ABDOMEN PELVIS W CONTRAST    Result Date: 07/20/2022  EXAMINATION: CTA CHEST ABDOMEN PELVIS W CONTRAST 07/20/2022 5:44 PM ACCESSION NUMBER: [id number] COMPARISON: None available INDICATION:  abdominal pain, back pain, elevaed d dimer, hypotension TECHNIQUE: Dedicated CT angiographic images were obtained from the lung apices to the symphysis pubis following administration Iso-vue .  Additional delayed images were obtained through the abdomen and pelvis.  Multiplanar reformats and 3-D reprocessing was utilized for optimal assessment of the images. Radiation dose reduction techniques were used for this study. Our CT scanners use one or all of the following: Automated exposure  control, adjustment of the mA and/or kV according to patient size, iterative reconstruction. FINDINGS: AIRWAYS: The central airways are patent. LUNGS: The lungs are clear bilaterally.  No suspicious pulmonary nodules. PLEURA: No pleural effusion or pneumothorax. HEART: The heart is not enlarged. No calcified coronary atherosclerosis.  No pericardial effusion. THORACIC AORTA: The aorta is normal in caliber.  No evidence of aortic dissection, intramural hematoma, or atherosclerotic ulcer. PULMONARY ARTERY: No pulmonary embolus to the segmental level. The main pulmonary artery is normal in caliber. MEDIASTINUM/HILA: No mediastinal mass or lymphadenopathy. CHEST WALL: No mass or axillary lymphadenopathy. LIVER: The liver contour is normal. No suspicious liver lesion. BILIARY TREE: The gallbladder is within normal limits. No biliary dilation. SPLEEN: Normal. PANCREAS: No pancreatic mass or ductal dilation. ADRENALS: Normal. KIDNEYS/BLADDER: The kidneys are symmetric in size. No renal calculus or hydronephrosis. No renal mass. The urinary bladder is unremarkable. BOWEL: The colon is unremarkable. The small bowel is normal in caliber. No bowel wall thickening. PERITONEUM/RETROPERITONEUM: No ascites or free air. No pelvic or retroperitoneal lymphadenopathy. VESSELS: No abdominal aortic aneurysm or dissection. Mild atherosclerotic disease in the aorta. The celiac, SMA, and IMA are patent without significant stenosis. The renal arteries are patent without flow-limiting stenosis. The bilateral iliac arteries are patent without flow-limiting stenosis. ABDOMINAL WALL: No hernia or mass. REPRODUCTIVE: Unremarkable. BONES: No suspicious osseous lesion.     No aortic dissection or aneurysm. Electronically signed by Alycia Rossetti    MRI THORACIC SPINE WO CONTRAST    Result Date: 07/20/2022  EXAMINATION: MRI THORACIC SPINE WO CONTRAST 07/20/2022 5:38 PM ACCESSION NUMBER: [id number] COMPARISON: None available INDICATION: Evaluate myelopathy  TECHNIQUE: Multisequence, multiplanar MR images were obtained of the thoracic spine without the administration of intravenous contrast. FINDINGS: The bone marrow has normal signal characteristics with scattered areas of fatty marrow replacement. Spinal canal is patent. Spinal cord has normal signal. No epidural mass or hematoma. The disc spaces and facet joints are grossly intact throughout the thoracic spine. The surrounding soft tissues are unremarkable.     No acute findings in the thoracic spine. Electronically signed by Alycia Rossetti    XR CHEST PORTABLE    Result Date: 07/20/2022  Chest X-ray INDICATION: Hypotension COMPARISON: November 07, 2018 TECHNIQUE: AP/PA view of the chest was obtained. FINDINGS: The lungs are clear. There are no infiltrates or effusions.  The heart size is normal.  The bony thorax is intact.      No acute findings in the chest Electronically signed by Berdine Addison      CBC:   No results for input(s): "WBC", "HGB", "PLT" in the last 72 hours.    BMP:    No results for input(s): "NA", "K", "CL", "CO2", "BUN", "CREATININE", "GLUCOSE" in the last 72 hours.    Hepatic:   No results for input(s): "AST", "ALT", "BILITOT", "ALKPHOS" in the last 72 hours.    Invalid input(s): "ALB"    Lipids:   Lab Results   Component Value Date/Time    CHOL 90 06/11/2018 02:19 AM    HDL 56 07/12/2022 04:07  PM    TRIG 158 06/11/2018 02:19 AM     Hemoglobin A1C:   Lab Results   Component Value Date/Time    LABA1C 6.4 07/20/2022 04:39 PM     TSH:   Lab Results   Component Value Date/Time    TSH 1.42 03/12/2017 03:00 PM     Troponin:   Lab Results   Component Value Date/Time    TROPONINT <0.010 11/07/2018 10:19 AM    TROPONINT <0.010 03/18/2018 05:14 PM    TROPONINT <0.010 09/08/2017 06:43 PM     Lactic Acid: No results for input(s): "LACTA" in the last 72 hours.  BNP: No results for input(s): "PROBNP" in the last 72 hours.  UA:  Lab Results   Component Value Date/Time    NITRU NEGATIVE 07/20/2022 08:15 PM    COLORU  YELLOW 07/20/2022 08:15 PM    PHUR 5.5 07/20/2022 08:15 PM    WBCUA NO CELLS SEEN 05/10/2019 01:46 PM    RBCUA 6 05/10/2019 01:46 PM    MUCUS RARE 06/10/2018 06:50 PM    TRICHOMONAS NONE SEEN 06/10/2018 06:50 PM    BACTERIA NEGATIVE 05/10/2019 01:46 PM    CLARITYU CLEAR 07/20/2022 08:15 PM    LEUKOCYTESUR NEGATIVE 07/20/2022 08:15 PM    UROBILINOGEN 0.2 07/20/2022 08:15 PM    BILIRUBINUR NEGATIVE 07/20/2022 08:15 PM    BLOODU NEGATIVE 07/20/2022 08:15 PM    GLUCOSEU NEGATIVE 07/20/2022 08:15 PM    KETUA NEGATIVE 07/20/2022 08:15 PM     Urine Cultures: No results found for: "LABURIN"  Blood Cultures: No results found for: "BC"  No results found for: "BLOODCULT2"  Organism: No results found for: "ORG"      Electronically signed by Sandy Salaam, MD on 07/24/2022 at 10:04 AM

## 2022-07-25 ENCOUNTER — Inpatient Hospital Stay: Admit: 2022-07-25 | Payer: PRIVATE HEALTH INSURANCE | Primary: Family

## 2022-07-25 LAB — COMPREHENSIVE METABOLIC PANEL
ALT: 11 U/L (ref 10–40)
AST: 11 IU/L — ABNORMAL LOW (ref 15–37)
Albumin: 4.3 GM/DL (ref 3.4–5.0)
Alkaline Phosphatase: 64 IU/L (ref 40–128)
Anion Gap: 13 (ref 7–16)
BUN: 15 MG/DL (ref 6–23)
CO2: 25 MMOL/L (ref 21–32)
Calcium: 9.7 MG/DL (ref 8.3–10.6)
Chloride: 102 mMol/L (ref 99–110)
Creatinine: 1 MG/DL (ref 0.9–1.3)
Est, Glom Filt Rate: 86 mL/min/{1.73_m2} (ref >60)
Glucose: 164 MG/DL — ABNORMAL HIGH (ref 70–99)
Potassium: 4.8 MMOL/L (ref 3.5–5.1)
Sodium: 140 MMOL/L (ref 135–145)
Total Bilirubin: 0.2 MG/DL (ref 0.0–1.0)
Total Protein: 7 GM/DL (ref 6.4–8.2)

## 2022-07-25 LAB — CBC
Hematocrit: 38.9 % — ABNORMAL LOW (ref 42–52)
Hemoglobin: 12.7 GM/DL — ABNORMAL LOW (ref 13.5–18.0)
MCH: 28.4 PG (ref 27–31)
MCHC: 32.6 % (ref 32.0–36.0)
MCV: 87 FL (ref 78–100)
MPV: 9.6 FL (ref 7.5–11.1)
Platelets: 283 10*3/uL (ref 140–440)
RBC: 4.47 10*6/uL — ABNORMAL LOW (ref 4.6–6.2)
RDW: 17.2 % — ABNORMAL HIGH (ref 11.7–14.9)
WBC: 15.1 10*3/uL — ABNORMAL HIGH (ref 4.0–10.5)

## 2022-07-25 LAB — ECHO (TTE) COMPLETE (PRN CONTRAST/BUBBLE/STRAIN/3D)
AV Area by Peak Velocity: 1.9 cm2
AV Area by VTI: 1.9 cm2
AV Mean Gradient: 4 mmHg
AV Mean Velocity: 0.9 m/s
AV Peak Gradient: 8 mmHg
AV Peak Velocity: 1.4 m/s
AV VTI: 19.9 cm
AV Velocity Ratio: 0.64
AVA/BSA Peak Velocity: 1.1 cm2/m2
AVA/BSA VTI: 1.1 cm2/m2
Ao Root Index: 1.57 cm/m2
Aortic Root: 2.8 cm
Body Surface Area: 1.8 m2
E/E' Lateral: 6
E/E' Ratio (Averaged): 6.38
E/E' Septal: 6.75
Fractional Shortening 2D: 23 % (ref 28–44)
IVSd: 1.1 cm — AB (ref 0.6–1.0)
LA Area 4C: 7.7 cm2
LA Diameter: 3.3 cm
LA Major Axis: 4 cm
LA Size Index: 1.85 cm/m2
LA Volume Index MOD A4C: 7 ml/m2 — AB (ref 16–34)
LA Volume MOD A4C: 12 mL — AB (ref 18–58)
LA/AO Root Ratio: 1.18
LV E' Lateral Velocity: 9 cm/s
LV E' Septal Velocity: 8 cm/s
LV EDV A4C: 63 mL
LV EDV Index A4C: 35 mL/m2
LV ESV A4C: 26 mL
LV ESV Index A4C: 15 mL/m2
LV Ejection Fraction A4C: 59 %
LV Mass 2D Index: 80.1 g/m2 (ref 49–115)
LV Mass 2D: 142.5 g (ref 88–224)
LV RWT Ratio: 0.42
LVIDd Index: 2.42 cm/m2
LVIDd: 4.3 cm (ref 4.2–5.9)
LVIDs Index: 1.85 cm/m2
LVIDs: 3.3 cm
LVOT Area: 2.8 cm2
LVOT Diameter: 1.9 cm
LVOT Mean Gradient: 2 mmHg
LVOT Peak Gradient: 3 mmHg
LVOT Peak Velocity: 0.9 m/s
LVOT SV: 38 ml
LVOT Stroke Volume Index: 21.3 mL/m2
LVOT VTI: 13.4 cm
LVOT:AV VTI Index: 0.67
LVPWd: 0.9 cm (ref 0.6–1.0)
MV A Velocity: 0.87 m/s
MV E Velocity: 0.54 m/s
MV E Wave Deceleration Time: 204 ms
MV E/A: 0.62
RV Mid Dimension: 2.8 cm

## 2022-07-25 LAB — POCT GLUCOSE
POC Glucose: 173 MG/DL — ABNORMAL HIGH (ref 70–99)
POC Glucose: 141 MG/DL — ABNORMAL HIGH (ref 70–99)
POC Glucose: 207 MG/DL — ABNORMAL HIGH (ref 70–99)
POC Glucose: 187 MG/DL — ABNORMAL HIGH (ref 70–99)

## 2022-07-25 LAB — PROTIME/INR & PTT
INR: 0.9 INDEX
Protime: 12.4 SECONDS (ref 11.7–14.5)
aPTT: 34.6 SECONDS (ref 25.1–37.1)

## 2022-07-25 MED FILL — LIPITOR 40 MG PO TABS: 40 MG | ORAL | Qty: 1

## 2022-07-25 MED FILL — GABAPENTIN 400 MG PO CAPS: 400 MG | ORAL | Qty: 1

## 2022-07-25 MED FILL — TAMSULOSIN HCL 0.4 MG PO CAPS: 0.4 MG | ORAL | Qty: 1

## 2022-07-25 MED FILL — TIZANIDINE HCL 4 MG PO TABS: 4 MG | ORAL | Qty: 1

## 2022-07-25 MED FILL — OXYCODONE HCL 10 MG PO TABS: 10 MG | ORAL | Qty: 1

## 2022-07-25 MED FILL — CULTURELLE PO CAPS: ORAL | Qty: 1

## 2022-07-25 MED FILL — ACETAMINOPHEN 325 MG PO TABS: 325 MG | ORAL | Qty: 2

## 2022-07-25 MED FILL — PANTOPRAZOLE SODIUM 40 MG PO TBEC: 40 MG | ORAL | Qty: 1

## 2022-07-25 MED FILL — TRAZODONE HCL 50 MG PO TABS: 50 MG | ORAL | Qty: 3

## 2022-07-25 MED FILL — HEPARIN SODIUM (PORCINE) 5000 UNIT/ML IJ SOLN: 5000 UNIT/ML | INTRAMUSCULAR | Qty: 1

## 2022-07-25 MED FILL — CYMBALTA 30 MG PO CPEP: 30 MG | ORAL | Qty: 2

## 2022-07-25 MED FILL — FERROUS SULFATE 325 (65 FE) MG PO TABS: 325 (65 Fe) MG | ORAL | Qty: 1

## 2022-07-25 NOTE — Plan of Care (Signed)
Problem: Discharge Planning  Goal: Discharge to home or other facility with appropriate resources  07/25/2022 0919 by Moreen Fowler, RN  Outcome: Progressing  07/25/2022 0918 by Moreen Fowler, RN  Outcome: Not Progressing  07/24/2022 2202 by Jacklyn Shell, RN  Outcome: Progressing     Problem: Safety - Adult  Goal: Free from fall injury  07/25/2022 0919 by Moreen Fowler, RN  Outcome: Progressing  07/25/2022 0918 by Moreen Fowler, RN  Outcome: Not Progressing  07/24/2022 2202 by Jacklyn Shell, RN  Outcome: Progressing     Problem: Musculoskeletal - Adult  Goal: Return mobility to safest level of function  07/25/2022 0919 by Moreen Fowler, RN  Outcome: Progressing  07/25/2022 0918 by Moreen Fowler, RN  Outcome: Not Progressing  07/24/2022 2202 by Jacklyn Shell, RN  Outcome: Progressing  Goal: Return ADL status to a safe level of function  07/25/2022 0919 by Moreen Fowler, RN  Outcome: Progressing  07/25/2022 0918 by Moreen Fowler, RN  Outcome: Not Progressing  07/24/2022 2202 by Jacklyn Shell, RN  Outcome: Progressing     Problem: Metabolic/Fluid and Electrolytes - Adult  Goal: Electrolytes maintained within normal limits  07/25/2022 0919 by Moreen Fowler, RN  Outcome: Progressing  07/25/2022 0918 by Moreen Fowler, RN  Outcome: Not Progressing  07/24/2022 2202 by Jacklyn Shell, RN  Outcome: Progressing  Goal: Glucose maintained within prescribed range  07/25/2022 0919 by Moreen Fowler, RN  Outcome: Progressing  07/25/2022 0918 by Moreen Fowler, RN  Outcome: Not Progressing  07/24/2022 2202 by Jacklyn Shell, RN  Outcome: Progressing     Problem: Chronic Conditions and Co-morbidities  Goal: Patient's chronic conditions and co-morbidity symptoms are monitored and maintained or improved  07/25/2022 0919 by Moreen Fowler, RN  Outcome: Progressing  07/25/2022 0918 by Moreen Fowler, RN  Outcome: Not Progressing  07/24/2022 2202 by Jacklyn Shell, RN  Outcome: Progressing     Problem: Pain  Goal: Verbalizes/displays adequate  comfort level or baseline comfort level  07/25/2022 0919 by Moreen Fowler, RN  Outcome: Progressing  07/25/2022 0918 by Moreen Fowler, RN  Outcome: Not Progressing  07/24/2022 2202 by Jacklyn Shell, RN  Outcome: Progressing     Problem: Discharge Planning  Goal: Discharge to home or other facility with appropriate resources  07/25/2022 0919 by Moreen Fowler, RN  Outcome: Progressing  07/25/2022 0918 by Moreen Fowler, RN  Outcome: Not Progressing  07/24/2022 2202 by Jacklyn Shell, RN  Outcome: Progressing     Problem: Safety - Adult  Goal: Free from fall injury  07/25/2022 0919 by Moreen Fowler, RN  Outcome: Progressing  07/25/2022 0918 by Moreen Fowler, RN  Outcome: Not Progressing  07/24/2022 2202 by Jacklyn Shell, RN  Outcome: Progressing     Problem: Musculoskeletal - Adult  Goal: Return mobility to safest level of function  07/25/2022 0919 by Moreen Fowler, RN  Outcome: Progressing  07/25/2022 0918 by Moreen Fowler, RN  Outcome: Not Progressing  07/24/2022 2202 by Jacklyn Shell, RN  Outcome: Progressing  Goal: Return ADL status to a safe level of function  07/25/2022 0919 by Moreen Fowler, RN  Outcome: Progressing  07/25/2022 0918 by Moreen Fowler, RN  Outcome: Not Progressing  07/24/2022 2202 by Jacklyn Shell, RN  Outcome: Progressing     Problem: Metabolic/Fluid and Electrolytes - Adult  Goal: Electrolytes maintained within normal limits  07/25/2022 0919 by Moreen Fowler, RN  Outcome: Progressing  07/25/2022 0918 by Moreen Fowler, RN  Outcome: Not Progressing  07/24/2022 2202 by Jacklyn Shell, RN  Outcome: Progressing  Goal: Glucose maintained within prescribed range  07/25/2022 0919 by Moreen Fowler, RN  Outcome: Progressing  07/25/2022 0918 by Moreen Fowler, RN  Outcome: Not Progressing  07/24/2022 2202 by Jacklyn Shell, RN  Outcome: Progressing     Problem: Chronic Conditions and Co-morbidities  Goal: Patient's chronic conditions and co-morbidity symptoms are monitored and maintained or improved  07/25/2022  0919 by Moreen Fowler, RN  Outcome: Progressing  07/25/2022 0918 by Moreen Fowler, RN  Outcome: Not Progressing  07/24/2022 2202 by Jacklyn Shell, RN  Outcome: Progressing     Problem: Pain  Goal: Verbalizes/displays adequate comfort level or baseline comfort level  07/25/2022 0919 by Moreen Fowler, RN  Outcome: Progressing  07/25/2022 0918 by Moreen Fowler, RN  Outcome: Not Progressing  07/24/2022 2202 by Jacklyn Shell, RN  Outcome: Progressing

## 2022-07-25 NOTE — Progress Notes (Signed)
Neurosurgery Progress Note  07/25/2022 9:08 AM      C3-4, C4-5, and C5-6 anterior cervical discectomy and fusion  Day of procedure: 07/26/22    Assessment and Plan:   Cervical myelopathy- plan for decompression tomorrow afternoon if cleared, cardiology consulted to assist with clearance. Plan for stress test tomorrow am. Keep NPO after midnight.   AKI- 1.4 on admission, improved to 1.0  DM type 2- A1c 6.4 this admission  Tobacco use- smokes 1.5ppd   Leukocytosis- chronic, afebrile this admission, no UTI    Supervising physician:Domingo Thelma Comp, MD.  Dr. Luther Redo was readily and continuously available by phone for direct consultation regarding the care of this patient.    Dragon dictation may have been used in the writing of this note. Spelling or word errors may have occurred. Please call for clarification if there are concerns.      Subjective:   Admit Date: 07/20/2022  PCP: Oval Linsey, APRN - CNP    Patient sitting up in bed, family bedside. He continues to complain of neck pain.     Data:   Scheduled Meds:   acetaminophen  650 mg Oral 4 times per day    [Held by provider] aspirin  81 mg Oral Daily    atorvastatin  40 mg Oral Nightly    [Held by provider] carvedilol  12.5 mg Oral BID    [Held by provider] clopidogrel  75 mg Oral Daily    DULoxetine  60 mg Oral Daily    ferrous sulfate  325 mg Oral Daily    gabapentin  400 mg Oral TID    lactobacillus  1 capsule Oral Daily    linaclotide  145 mcg Oral Daily    pantoprazole  40 mg Oral BID AC    tamsulosin  0.4 mg Oral Daily    tiZANidine  4 mg Oral TID    insulin lispro  0-4 Units SubCUTAneous TID WC    insulin lispro  0-4 Units SubCUTAneous Nightly    sodium chloride flush  5-40 mL IntraVENous 2 times per day    heparin (porcine)  5,000 Units SubCUTAneous 3 times per day    traZODone  150 mg Oral Nightly         I/O last 3 completed shifts:  In: 490 [P.O.:480; I.V.:10]  Out: 2650 [Urine:2650]  I/O this shift:  In: -   Out: 100 [Urine:100]    Intake/Output  Summary (Last 24 hours) at 07/25/2022 0908  Last data filed at 07/25/2022 0744  Gross per 24 hour   Intake 480 ml   Output 1750 ml   Net -1270 ml     CULTURE results: Invalid input(s): "BLOOD CULTURE", "URINE CULTURE", "SURGICAL CULTURE"  CBC:   Recent Labs     07/25/22  0151   WBC 15.1*   HGB 12.7*   PLT 283     BMP:    Recent Labs     07/25/22  0151   NA 140   K 4.8   CL 102   CO2 25   BUN 15   CREATININE 1.0   GLUCOSE 164*     Hepatic:   Recent Labs     07/25/22  0151   AST 11*   ALT 11   BILITOT 0.2   ALKPHOS 64         IMAGING: available xray, CT, and MRI results reviewed    Objective:   Vitals: BP (!) 142/86   Pulse 65  Temp 97.7 F (36.5 C) (Oral)   Resp 18   Ht 1.676 m (5' 5.98")   Wt 71 kg (156 lb 8.4 oz)   SpO2 97%   BMI 25.28 kg/m   General appearance: alert, sitting up in bed, in no distress  HEENT: normocephalic, atraumatic, EOMI  DERM: no significant rashes or lesions identified  Neurologic: Mental status: alert and oriented x4, speech clear and coherent, no facial droop, follows commands briskly, sensation decreased in bilateral arms  Extremities: bilateral upper extremities 5/5 throughout, bilateral hoffman's present, no clonus. Bilateral lower extremities 5/5 throughout   Neurosurgical Incision: none  Drains: none    Electronically signed by Jonetta Speak, PA-C on 07/25/2022 at 9:08 AM

## 2022-07-25 NOTE — Care Coordination-Inpatient (Signed)
CM reviewed pt's medical record and discussed pt in IDR. CM introduced self and explained the role of case management. CM in to see pt to initiate D/C planning. Pt is cooperative to assessment. CM discussed PT/OT process/recommendations. Pt's first plan is to return home with assistance from his significant other and daughter; however pt is leaving his options open for alternative plans. Pt denies any other DC needs.      Plan for DC is home with family vs alternative DC plan if needed. CM will continue to monitor.     07/25/22 1703   Service Assessment   Patient Orientation Alert and Oriented   Cognition Alert   History Provided By Patient;Medical Record   Primary Caregiver Self   Support Systems Spouse/Significant Other;Children   Patient's Healthcare Decision Maker is: Legal Next of Kin   Last Visit to PCP Within last 3 months   Prior Functional Level Independent in ADLs/IADLs   Current Functional Level Assistance with the following:;Mobility   Can patient return to prior living arrangement Unknown at present   Ability to make needs known: Good   Family able to assist with home care needs: Yes   Would you like for me to discuss the discharge plan with any other family members/significant others, and if so, who? No   Financial Resources [employer] None   Social/Functional History   Lives With Significant other   Type of Home House   Home Layout Two level;Able to Live on Main level with bedroom/bathroom   Home Access Stairs to enter with rails   Entrance Stairs - Number of Steps 3   Bathroom Shower/Tub Tub/Shower unit   Dietitian Help From Family   ADL Assistance Independent   Barrister's clerk No   Patient's Driver Info Significant other, daughter, or medical rides through insurance   Occupation Retired    Health visitor Family Members;Spouse/Significant Other   Current Services Prior To Admission None   Patient expects to be discharged to: [facility]

## 2022-07-25 NOTE — Plan of Care (Signed)
Problem: Discharge Planning  Goal: Discharge to home or other facility with appropriate resources  07/25/2022 2129 by Jacklyn Shell, RN  Outcome: Progressing  07/25/2022 0919 by Moreen Fowler, RN  Outcome: Progressing  07/25/2022 0918 by Moreen Fowler, RN  Outcome: Not Progressing     Problem: Safety - Adult  Goal: Free from fall injury  07/25/2022 2129 by Jacklyn Shell, RN  Outcome: Progressing  07/25/2022 0919 by Moreen Fowler, RN  Outcome: Progressing  07/25/2022 0918 by Moreen Fowler, RN  Outcome: Not Progressing     Problem: Musculoskeletal - Adult  Goal: Return mobility to safest level of function  07/25/2022 2129 by Jacklyn Shell, RN  Outcome: Progressing  07/25/2022 0919 by Moreen Fowler, RN  Outcome: Progressing  07/25/2022 0918 by Moreen Fowler, RN  Outcome: Not Progressing  Goal: Return ADL status to a safe level of function  07/25/2022 2129 by Jacklyn Shell, RN  Outcome: Progressing  07/25/2022 0919 by Moreen Fowler, RN  Outcome: Progressing  07/25/2022 0918 by Moreen Fowler, RN  Outcome: Not Progressing     Problem: Metabolic/Fluid and Electrolytes - Adult  Goal: Electrolytes maintained within normal limits  07/25/2022 2129 by Jacklyn Shell, RN  Outcome: Progressing  07/25/2022 0919 by Moreen Fowler, RN  Outcome: Progressing  07/25/2022 0918 by Moreen Fowler, RN  Outcome: Not Progressing  Goal: Glucose maintained within prescribed range  07/25/2022 2129 by Jacklyn Shell, RN  Outcome: Progressing  07/25/2022 0919 by Moreen Fowler, RN  Outcome: Progressing  07/25/2022 0918 by Moreen Fowler, RN  Outcome: Not Progressing     Problem: Chronic Conditions and Co-morbidities  Goal: Patient's chronic conditions and co-morbidity symptoms are monitored and maintained or improved  07/25/2022 2129 by Jacklyn Shell, RN  Outcome: Progressing  07/25/2022 0919 by Moreen Fowler, RN  Outcome: Progressing  07/25/2022 0918 by Moreen Fowler, RN  Outcome: Not Progressing     Problem: Pain  Goal: Verbalizes/displays adequate  comfort level or baseline comfort level  07/25/2022 2129 by Jacklyn Shell, RN  Outcome: Progressing  07/25/2022 0919 by Moreen Fowler, RN  Outcome: Progressing  07/25/2022 0918 by Moreen Fowler, RN  Outcome: Not Progressing     Problem: Discharge Planning  Goal: Discharge to home or other facility with appropriate resources  07/25/2022 2129 by Jacklyn Shell, RN  Outcome: Progressing  07/25/2022 0919 by Moreen Fowler, RN  Outcome: Progressing  07/25/2022 0918 by Moreen Fowler, RN  Outcome: Not Progressing     Problem: Safety - Adult  Goal: Free from fall injury  07/25/2022 2129 by Jacklyn Shell, RN  Outcome: Progressing  07/25/2022 0919 by Moreen Fowler, RN  Outcome: Progressing  07/25/2022 0918 by Moreen Fowler, RN  Outcome: Not Progressing     Problem: Musculoskeletal - Adult  Goal: Return mobility to safest level of function  07/25/2022 2129 by Jacklyn Shell, RN  Outcome: Progressing  07/25/2022 0919 by Moreen Fowler, RN  Outcome: Progressing  07/25/2022 0918 by Moreen Fowler, RN  Outcome: Not Progressing  Goal: Return ADL status to a safe level of function  07/25/2022 2129 by Jacklyn Shell, RN  Outcome: Progressing  07/25/2022 0919 by Moreen Fowler, RN  Outcome: Progressing  07/25/2022 0918 by Moreen Fowler, RN  Outcome: Not Progressing     Problem: Metabolic/Fluid and Electrolytes - Adult  Goal: Electrolytes maintained within normal limits  07/25/2022 2129 by Jacklyn Shell, RN  Outcome: Progressing  07/25/2022 0919 by Moreen Fowler, RN  Outcome: Progressing  07/25/2022 0918 by Moreen Fowler, RN  Outcome: Not Progressing  Goal: Glucose maintained within prescribed range  07/25/2022 2129 by Jacklyn Shell, RN  Outcome: Progressing  07/25/2022 0919 by Moreen Fowler, RN  Outcome: Progressing  07/25/2022 0918 by Moreen Fowler, RN  Outcome: Not Progressing     Problem: Chronic Conditions and Co-morbidities  Goal: Patient's chronic conditions and co-morbidity symptoms are monitored and maintained or improved  07/25/2022  2129 by Jacklyn Shell, RN  Outcome: Progressing  07/25/2022 0919 by Moreen Fowler, RN  Outcome: Progressing  07/25/2022 0918 by Moreen Fowler, RN  Outcome: Not Progressing     Problem: Pain  Goal: Verbalizes/displays adequate comfort level or baseline comfort level  07/25/2022 2129 by Jacklyn Shell, RN  Outcome: Progressing  07/25/2022 0919 by Moreen Fowler, RN  Outcome: Progressing  07/25/2022 0918 by Moreen Fowler, RN  Outcome: Not Progressing

## 2022-07-25 NOTE — Consults (Signed)
CARDIOLOGY CONSULT NOTE   Reason for consultation: Preoperative risk assessed    Referring physician:  Hulan Saas, MD     Primary care physician: Oval Linsey, APRN - CNP      Dear  Dr.  Hulan Saas, MD   Thanks for the consult.    Chief Complaints :  Chief Complaint   Patient presents with    Neck Pain     States he needs emergency surgery for pain that he has had for 20 years, sent by Dr. Luther Redo        History of present illness:Ricardo Foley is a 62 y.o.year old who presents with ongoing shortness of breath and neck pain he is planned for C3-C4 and C4-C5 surgery cardiology was asked to evaluate him.  He does have mild renal sufficiency previous history of coronary artery disease he says he had a cardiac cath several years ago which showed extensive disease but did not go undergo intervention he also has severe peripheral vascular disease used to be on antiplatelet and medical management but his meds were stopped about 8 to 9 months ago when he went to [place] currently reports some shortness of breath with exertion he has significant peripheral vascular pain and leg pain he has had several peripheral interventions smokes a pack a day    Past medical history:    has a past medical history of CAD (coronary artery disease), Chronic back pain, Diabetes (HCC), H/O cardiac catheterization, H/O cardiovascular stress test, H/O Doppler lower arterial ultrasound, H/O echocardiogram, Hyperlipidemia, Hypertension, IBS (irritable bowel syndrome), Nerve pain, Pancreatitis, PVD (peripheral vascular disease) (HCC), and Scoliosis.  Past surgical history:   has a past surgical history that includes chg US guidance needle placement img s&i.  Social History:   reports that he has been smoking cigarettes. He started smoking about 50 years ago. He has a 50.5 pack-year smoking history. He has never used smokeless tobacco. He reports current drug use. Frequency: 14.00 times per week. Drug: Marijuana Sheran Fava). He reports that  he does not drink alcohol.  Family history:   no family history of CAD, STROKE of DM at early age    No Known Allergies    oxyCODONE (ROXICODONE) immediate release tablet 5 mg, Q4H PRN   Or  oxyCODONE HCl (OXY-IR) immediate release tablet 10 mg, Q4H PRN  acetaminophen (TYLENOL) tablet 650 mg, 4 times per day  albuterol sulfate HFA (PROVENTIL;VENTOLIN;PROAIR) 108 (90 Base) MCG/ACT inhaler 2 puff, Q6H PRN  [Held by provider] aspirin chewable tablet 81 mg, Daily  atorvastatin (LIPITOR) tablet 40 mg, Nightly  [Held by provider] carvedilol (COREG) tablet 12.5 mg, BID  [Held by provider] clopidogrel (PLAVIX) tablet 75 mg, Daily  DULoxetine (CYMBALTA) extended release capsule 60 mg, Daily  ferrous sulfate (IRON 325) tablet 325 mg, Daily  gabapentin (NEURONTIN) capsule 400 mg, TID  hydrOXYzine pamoate (VISTARIL) capsule 50 mg, TID PRN  lactobacillus (CULTURELLE) capsule 1 capsule, Daily  linaclotide (LINZESS) capsule 145 mcg  ++NON FORMULARY++ (Patient Supplied), Daily  pantoprazole (PROTONIX) tablet 40 mg, BID AC  tamsulosin (FLOMAX) capsule 0.4 mg, Daily  tiZANidine (ZANAFLEX) tablet 4 mg, TID  insulin lispro (HUMALOG,ADMELOG) injection vial 0-4 Units, TID WC  insulin lispro (HUMALOG,ADMELOG) injection vial 0-4 Units, Nightly  glucose chewable tablet 16 g, PRN  dextrose bolus 10% 125 mL, PRN   Or  dextrose bolus 10% 250 mL, PRN  glucagon injection 1 mg, PRN  dextrose 10 % infusion, Continuous PRN  sodium chloride flush 0.9 % injection  5-40 mL, 2 times per day  sodium chloride flush 0.9 % injection 5-40 mL, PRN  0.9 % sodium chloride infusion, PRN  potassium chloride 10 mEq/100 mL IVPB (Peripheral Line), PRN  magnesium sulfate 2000 mg in 50 mL IVPB premix, PRN  ondansetron (ZOFRAN-ODT) disintegrating tablet 4 mg, Q8H PRN   Or  ondansetron (ZOFRAN) injection 4 mg, Q6H PRN  melatonin tablet 3 mg, Nightly PRN  polyethylene glycol (GLYCOLAX) packet 17 g, Daily PRN  nicotine (NICODERM CQ) 21 MG/24HR 1 patch, Daily PRN  heparin  (porcine) injection 5,000 Units, 3 times per day  traZODone (DESYREL) tablet 150 mg, Nightly      Current Facility-Administered Medications   Medication Dose Route Frequency Provider Last Rate Last Admin    oxyCODONE (ROXICODONE) immediate release tablet 5 mg  5 mg Oral Q4H PRN Marty Heck, APRN - CNP        Or    oxyCODONE HCl (OXY-IR) immediate release tablet 10 mg  10 mg Oral Q4H PRN Marty Heck, APRN - CNP   10 mg at 07/25/22 1712    acetaminophen (TYLENOL) tablet 650 mg  650 mg Oral 4 times per day Hulan Saas, MD   650 mg at 07/25/22 1539    albuterol sulfate HFA (PROVENTIL;VENTOLIN;PROAIR) 108 (90 Base) MCG/ACT inhaler 2 puff  2 puff Inhalation Q6H PRN Hulan Saas, MD        [Held by provider] aspirin chewable tablet 81 mg  81 mg Oral Daily Almusawi, Hussain, MD        atorvastatin (LIPITOR) tablet 40 mg  40 mg Oral Nightly Almusawi, Hussain, MD   40 mg at 07/24/22 2046    [Held by provider] carvedilol (COREG) tablet 12.5 mg  12.5 mg Oral BID Almusawi, Hussain, MD   12.5 mg at 07/20/22 2220    [Held by provider] clopidogrel (PLAVIX) tablet 75 mg  75 mg Oral Daily Almusawi, Hussain, MD        DULoxetine (CYMBALTA) extended release capsule 60 mg  60 mg Oral Daily Almusawi, Hussain, MD   60 mg at 07/25/22 0916    ferrous sulfate (IRON 325) tablet 325 mg  325 mg Oral Daily Almusawi, Hussain, MD   325 mg at 07/25/22 0917    gabapentin (NEURONTIN) capsule 400 mg  400 mg Oral TID Hulan Saas, MD   400 mg at 07/25/22 1540    hydrOXYzine pamoate (VISTARIL) capsule 50 mg  50 mg Oral TID PRN Hulan Saas, MD   50 mg at 07/20/22 2219    lactobacillus (CULTURELLE) capsule 1 capsule  1 capsule Oral Daily Hulan Saas, MD   1 capsule at 07/25/22 0916    linaclotide (LINZESS) capsule 145 mcg  ++NON FORMULARY++ (Patient Supplied)  145 mcg Oral Daily Almusawi, Hussain, MD        pantoprazole (PROTONIX) tablet 40 mg  40 mg Oral BID AC Almusawi, Hussain, MD   40 mg at 07/25/22 1539     tamsulosin (FLOMAX) capsule 0.4 mg  0.4 mg Oral Daily Almusawi, Hussain, MD   0.4 mg at 07/25/22 0916    tiZANidine (ZANAFLEX) tablet 4 mg  4 mg Oral TID Hulan Saas, MD   4 mg at 07/25/22 1540    insulin lispro (HUMALOG,ADMELOG) injection vial 0-4 Units  0-4 Units SubCUTAneous TID WC Almusawi, Hussain, MD        insulin lispro (HUMALOG,ADMELOG) injection vial 0-4 Units  0-4 Units SubCUTAneous Nightly Almusawi, Hussain, MD        glucose chewable  tablet 16 g  4 tablet Oral PRN Almusawi, Eula Listen, MD        dextrose bolus 10% 125 mL  125 mL IntraVENous PRN Almusawi, Hussain, MD        Or    dextrose bolus 10% 250 mL  250 mL IntraVENous PRN Almusawi, Hussain, MD        glucagon injection 1 mg  1 mg SubCUTAneous PRN Almusawi, Hussain, MD        dextrose 10 % infusion   IntraVENous Continuous PRN Almusawi, Hussain, MD        sodium chloride flush 0.9 % injection 5-40 mL  5-40 mL IntraVENous 2 times per day Hulan Saas, MD   10 mL at 07/25/22 0917    sodium chloride flush 0.9 % injection 5-40 mL  5-40 mL IntraVENous PRN Almusawi, Hussain, MD        0.9 % sodium chloride infusion   IntraVENous PRN Almusawi, Hussain, MD        potassium chloride 10 mEq/100 mL IVPB (Peripheral Line)  10 mEq IntraVENous PRN Almusawi, Eula Listen, MD        magnesium sulfate 2000 mg in 50 mL IVPB premix  2,000 mg IntraVENous PRN Almusawi, Eula Listen, MD        ondansetron (ZOFRAN-ODT) disintegrating tablet 4 mg  4 mg Oral Q8H PRN Almusawi, Hussain, MD        Or    ondansetron (ZOFRAN) injection 4 mg  4 mg IntraVENous Q6H PRN Almusawi, Hussain, MD        melatonin tablet 3 mg  3 mg Oral Nightly PRN Almusawi, Hussain, MD        polyethylene glycol (GLYCOLAX) packet 17 g  17 g Oral Daily PRN Almusawi, Eula Listen, MD        nicotine (NICODERM CQ) 21 MG/24HR 1 patch  1 patch TransDERmal Daily PRN Almusawi, Eula Listen, MD        heparin (porcine) injection 5,000 Units  5,000 Units SubCUTAneous 3 times per day Hulan Saas, MD   5,000 Units at  07/25/22 1539    traZODone (DESYREL) tablet 150 mg  150 mg Oral Nightly Marty Heck, APRN - CNP   150 mg at 07/24/22 2046     Review of Systems:   Constitutional: No Fever or Weight Loss   Eyes: No Decreased Vision  ENT: No Headaches, Hearing Loss or Vertigo  Cardiovascular: As per HPI  Respiratory: As per HPI  Gastrointestinal: No abdominal pain, appetite loss, blood in stools, constipation, diarrhea or heartburn  Genitourinary: No dysuria, trouble voiding, or hematuria  Musculoskeletal:  No gait disturbance, weakness or joint complaints  Integumentary: No rash or pruritis  Neurological: No TIA or stroke symptoms  Psychiatric: No anxiety or depression  Endocrine: No malaise, fatigue or temperature intolerance  Hematologic/Lymphatic: No bleeding problems, blood clots or swollen lymph nodes  Allergic/Immunologic: No nasal congestion or hives  All systems negative except as marked.        Physical Examination:    Vitals:    07/25/22 1328 07/25/22 1350 07/25/22 1712 07/25/22 1742   BP: 128/77 128/77 131/70    Pulse: 80 80 71    Resp: 17  19 17    Temp: 98.2 F (36.8 C)  97.9 F (36.6 C)    TempSrc: Oral  Oral    SpO2: 97%  100%    Weight:  70.8 kg (156 lb)     Height:  1.651 m (5\' 5" )  General Appearance:  No distress, conversant    Constitutional:  Well developed, Well nourished, No acute distress, Non-toxic appearance.   HENT:  Normocephalic, Atraumatic, Bilateral external ears normal, Oropharynx moist, No oral exudates, Nose normal. Neck- Normal range of motion, No tenderness, Supple, No stridor,no apical-carotid delay  Lymphatics : no palpable lymph nodes  Eyes:  PERRL, EOMI, Conjunctiva normal, No discharge.   Respiratory:  Normal breath sounds, No respiratory distress, No wheezing, No chest tenderness.,no use of accessory muscles, crackles Absent   Cardiovascular: (PMI) apex non displaced,no lifts no thrills, ankle swelling Absent  , 1+, s1 and s2 audible,Murmur.Absent , JVD not noted    Abdomen /GI:   Bowel sounds normal, Soft, No tenderness, No masses, No gross visceromegaly   GU:  No costovertebral angle tenderness   Musculoskeletal:   no tenderness, no deformities. Back- no tenderness  Integument:  Well hydrated, no rash   Lymphatic:  No lymphadenopathy noted   Neurologic:  Alert & oriented x 3, CN 2-12 normal, normal motor function, normal sensory function, no focal deficits noted           Medical decision making and Data review:    Lab Review   Recent Labs     07/25/22  0151   WBC 15.1*   HGB 12.7*   HCT 38.9*   PLT 283      Recent Labs     07/25/22  0151   NA 140   K 4.8   CL 102   CO2 25   BUN 15   CREATININE 1.0     Recent Labs     07/25/22  0151   AST 11*   ALT 11   BILITOT 0.2   ALKPHOS 64     No results for input(s): "TROPONINT" in the last 72 hours.    No results for input(s): "PROBNP" in the last 72 hours.  Lab Results   Component Value Date    INR 0.9 07/25/2022    PROTIME 12.4 07/25/2022       EKG: (reviewed and interpreted by myself)    ECHO:(reviewed and interpreted by myself)    Chest Xray:(reviewed and interpreted by myself)  Echo (TTE) complete (PRN contrast/bubble/strain/3D)    Result Date: 07/25/2022    Left Ventricle: Normal left ventricular systolic function with a visually estimated EF of 55 - 60%. Left ventricle size is normal. Increased wall thickness. Normal wall motion. Normal diastolic function.   No significant valvular disease noted.   Pericardium: There is evidence of epicardial fat. No pericardial effusion.   Image quality is fair.     CTA CHEST ABDOMEN PELVIS W CONTRAST    Result Date: 07/20/2022  EXAMINATION: CTA CHEST ABDOMEN PELVIS W CONTRAST 07/20/2022 5:44 PM ACCESSION NUMBER: [id number] COMPARISON: None available INDICATION: abdominal pain, back pain, elevaed d dimer, hypotension TECHNIQUE: Dedicated CT angiographic images were obtained from the lung apices to the symphysis pubis following administration Iso-vue .  Additional delayed images were obtained through the  abdomen and pelvis.  Multiplanar reformats and 3-D reprocessing was utilized for optimal assessment of the images. Radiation dose reduction techniques were used for this study. Our CT scanners use one or all of the following: Automated exposure control, adjustment of the mA and/or kV according to patient size, iterative reconstruction. FINDINGS: AIRWAYS: The central airways are patent. LUNGS: The lungs are clear bilaterally.  No suspicious pulmonary nodules. PLEURA: No pleural effusion or pneumothorax. HEART: The heart is not enlarged. No calcified coronary  atherosclerosis.  No pericardial effusion. THORACIC AORTA: The aorta is normal in caliber.  No evidence of aortic dissection, intramural hematoma, or atherosclerotic ulcer. PULMONARY ARTERY: No pulmonary embolus to the segmental level. The main pulmonary artery is normal in caliber. MEDIASTINUM/HILA: No mediastinal mass or lymphadenopathy. CHEST WALL: No mass or axillary lymphadenopathy. LIVER: The liver contour is normal. No suspicious liver lesion. BILIARY TREE: The gallbladder is within normal limits. No biliary dilation. SPLEEN: Normal. PANCREAS: No pancreatic mass or ductal dilation. ADRENALS: Normal. KIDNEYS/BLADDER: The kidneys are symmetric in size. No renal calculus or hydronephrosis. No renal mass. The urinary bladder is unremarkable. BOWEL: The colon is unremarkable. The small bowel is normal in caliber. No bowel wall thickening. PERITONEUM/RETROPERITONEUM: No ascites or free air. No pelvic or retroperitoneal lymphadenopathy. VESSELS: No abdominal aortic aneurysm or dissection. Mild atherosclerotic disease in the aorta. The celiac, SMA, and IMA are patent without significant stenosis. The renal arteries are patent without flow-limiting stenosis. The bilateral iliac arteries are patent without flow-limiting stenosis. ABDOMINAL WALL: No hernia or mass. REPRODUCTIVE: Unremarkable. BONES: No suspicious osseous lesion.     No aortic dissection or aneurysm.  Electronically signed by Alycia Rossetti    MRI THORACIC SPINE WO CONTRAST    Result Date: 07/20/2022  EXAMINATION: MRI THORACIC SPINE WO CONTRAST 07/20/2022 5:38 PM ACCESSION NUMBER: [id number] COMPARISON: None available INDICATION: Evaluate myelopathy TECHNIQUE: Multisequence, multiplanar MR images were obtained of the thoracic spine without the administration of intravenous contrast. FINDINGS: The bone marrow has normal signal characteristics with scattered areas of fatty marrow replacement. Spinal canal is patent. Spinal cord has normal signal. No epidural mass or hematoma. The disc spaces and facet joints are grossly intact throughout the thoracic spine. The surrounding soft tissues are unremarkable.     No acute findings in the thoracic spine. Electronically signed by Alycia Rossetti    XR CHEST PORTABLE    Result Date: 07/20/2022  Chest X-ray INDICATION: Hypotension COMPARISON: November 07, 2018 TECHNIQUE: AP/PA view of the chest was obtained. FINDINGS: The lungs are clear. There are no infiltrates or effusions.  The heart size is normal.  The bony thorax is intact.      No acute findings in the chest Electronically signed by Berdine Addison    MRI CERVICAL SPINE WO CONTRAST    Result Date: 07/12/2022  DICTATING PHYSICIAN:  Virgina Jock, M.D. EXAMINATION:   Cervical spine MRI dated 07/12/2022. COMPARISON: Cervical spine MRI dated 01/14/2019. HISTORY: Cervical spondylosis. PROCEDURE: Utilizing a     [employer] imaging system, sagittal and axial images were acquired of the cervical spine. Sequences include sagittal T1, sagittal T2, sagittal STIR, axial T1, and axial T2. FINDINGS: Vertebral body height and marrow signal are normal in the cervical and visualized thoracic spinal column. Grade 1 spondylolisthesis at C3-C4, C4-C5, C5-C6, and C6-C7. There is approximately 1-2 mm of posterior translocation of C4 compared to C3. There is approximately 2-3 mm of posterior translocation of C5 compared to C4. There is approximately 2 mm of posterior  translocation of C5 compared to C6 and C6 compared to C7. These findings are most likely degenerative in nature. Alignment is maintained in the remainder the visualized vertebral column. The size, signal characteristics, and morphology of the cervical and visualized thoracic spinal cord are normal. No intramedullary masses or abnormal signal. No cord compression. No syrinx. The visualized intracranial contents are unremarkable. Cerebellar tonsils located above the foramen magnum. Disc height and signal intensity are are severely decreased at C3-C4, C4-C5, C5-C6, and C6-C7. Findings compatible  with severe disc desiccation and degeneration. Mild loss of disc height and signal intensity at C2-C3. Findings compatible with mild disc desiccation and degeneration. The C2-C3 intervertebral disc is unremarkable. No central canal stenosis or exit neural foraminal encroachment. The C3-C4 intervertebral disc demonstrates diffuse broad-based posterior disc bulging and posterior spurring. Bulging disc material and posterior spurs distort the ventral margin of the thecal sac and the anterior aspect of the cervical cord. No abnormal intramedullary signal to suggest cord injury. Moderate thickening of the ligamentum flavum bilaterally. Degenerative changes combine with grade 1 spondylolisthesis to cause severe central canal stenosis. Severe bilateral uncovertebral spurring. Mild to moderate bilateral hypertrophic arthropathy. Severe bilateral exit neural foraminal encroachment secondary to uncovertebral spurs and hypertrophic facets. The C4-C5 intervertebral disc demonstrates diffuse broad-based posterior disc bulging and posterior spurring. Bulging disc material and posterior spurs distort the ventral margin of the thecal sac and the anterior aspect of the cervical cord. No abnormal intramedullary signal to suggest cord injury. Moderate thickening of the ligamentum flavum bilaterally. Degenerative changes combine with grade 1  spondylolisthesis to cause severe central canal stenosis. Severe bilateral uncovertebral spurring. Mild bilateral hypertrophic arthropathy. Severe bilateral exit neural foraminal encroachment secondary to uncovertebral spurs and hypertrophic facets. The C4-C5 intervertebral disc demonstrates diffuse broad-based posterior disc bulging and posterior spurring. Bulging disc material and posterior spurs distort the ventral margin of the thecal sac and the anterior aspect of the cervical cord. No abnormal intramedullary signal to suggest cord injury. Moderate thickening of the ligamentum flavum bilaterally. Degenerative changes combine with grade 1 spondylolisthesis to cause severe central canal stenosis. Severe bilateral uncovertebral spurring. Severe bilateral exit neural foraminal encroachment secondary to uncovertebral spurs. The C6-C7 intervertebral disc demonstrates diffuse broad-based posterior disc bulging and posterior spurring. Bulging disc material and posterior spurs indent the ventral margin of the thecal sac, but do not touch the anterior aspect of the cervical cord. No central canal stenosis. Severe bilateral uncovertebral spurring. Severe bilateral exit neural foraminal Cortiment secondary to uncovertebral spurs. C7-T1 intervertebral disc is unremarkable. No central canal stenosis. Moderate right uncovertebral spurring. Moderate right exit neural foraminal encroachment secondary to uncovertebral spurs. The left exit neural foramen is patent.     1. Severe central canal stenosis at C3-C4 and C4-C5. 2. Severe multilevel disc degeneration and spondylosis with severe bilateral exit neural foraminal encroachment at C3-C4, C4-C5, C5-C6, and C6-C7. Electronically signed by Virgina Jock    MRI LUMBAR SPINE WO CONTRAST    Result Date: 07/12/2022  DICTATING PHYSICIAN: Virgina Jock, M.D. EXAMINATION:  Lumbar spine MRI dated 07/12/2022 COMPARISON: No prior lumbar spine MRI from this institution available for comparison.  HISTORY:   Lumbar spondylosis PROCEDURE: Using a 1.5 Tesla imaging system, sagittal and axial images were acquired of the lumbar spine. Sequences include sagittal T1, sagittal T2, sagittal STIR, axial T1, and axial T2. FINDINGS:  Vertebral body height, alignment, and marrow signal are normal in the lumbar and visualized thoracic spinal column. The conus medullaris terminates at the L2 level and appears morphologically normal. No abnormal signal within the conus medullaris or cauda equina nerve roots. No intramedullary or paravertebral masses. No compression of the conus medullaris or visualized thoracic spinal cord. Disc height and signal intensity are maintained in the lumbar and visualized thoracic intervertebral discs. The T12-L1 and L1-L2 intervertebral discs are unremarkable. No central canal stenosis or exit neural foraminal encroachment. The L2-L3 intervertebral disc is unremarkable. No central canal stenosis or exit neural foraminal encroachment. Mild bilateral hypertrophic arthropathy with low-grade thickening of  ligamentum flavum bilaterally. The L3-L4 intervertebral disc is unremarkable. No central canal stenosis or exit neural foraminal encroachment. Mild bilateral hypertrophic arthropathy with low-grade thickening of the ligamentum flavum bilaterally and small bilateral facet effusions. The L4-L5 intervertebral disc is unremarkable. No central canal stenosis or exit neural foraminal encroachment. Mild bilateral hypertrophic arthropathy with low-grade thickening of ligamentum flavum bilaterally and small bilateral facet effusions. The L5-S1 intervertebral disc demonstrates is unremarkable. No central canal stenosis or exit neural foraminal encroachment. Mild bilateral hypertrophic send arthropathy with small bilateral facet effusions.     1. Mild multilevel facet degeneration. Details above. Electronically signed by Virgina Jock      All labs, medications and tests reviewed by myself including data  from  outside source , patient and available family .  Continue all other medications of all above medical condition listed as is.     Impression:  Principal Problem:    Cervical myelopathy (HCC)  Active Problems:    PAD (peripheral artery disease) (HCC)    ASCVD (arteriosclerotic cardiovascular disease)  Resolved Problems:    * No resolved hospital problems. *      Assessment: 62 y.o.year old with PMH of  has a past medical history of CAD (coronary artery disease), Chronic back pain, Diabetes (HCC), H/O cardiac catheterization, H/O cardiovascular stress test, H/O Doppler lower arterial ultrasound, H/O echocardiogram, Hyperlipidemia, Hypertension, IBS (irritable bowel syndrome), Nerve pain, Pancreatitis, PVD (peripheral vascular disease) (HCC), and Scoliosis.      Plan and Recommendations:    He has known severe peripheral vascular disease and coronary artery disease currently appears euvolemic but has shortness of breath with exertion will get stress test for restratification purposes  Get echo     Peripheral vascular disease currently on Pletal continue conservative management  Cervical myelopathy as per primary team and neurosurgery discussed with orthospine  DVT prophylaxis if no contraindication  6.   Dyslipidemia: continue statins   Discussed with primary team, hospitalist service ,bedside staff ,nursing staff, patient and family        Thank you  much for consult and giving [place] the opportunity in contributing in the care of this patient. Please feel free to call me for any questions.       Catalina Antigua, MD, 07/25/2022 6:02 PM     The above note is prepared with the intention to serve as communication with trained medical care practitioners only. Some of the information is provided by secondary sources as well as from our patient and/or family member(s) recollection, thus inaccuracies may occur. In addition, other professionals integrate data into the electronic medical record, and the [facility] MD and NPs are  not responsible for these. Any errors will be corrected upon verification with documented reports.

## 2022-07-25 NOTE — Progress Notes (Signed)
Patient had breakfast and 2 cups of Coffee patient can be started for stress on 07/26/2022. Nuclear Medicine informed the nurse and the nurse expressed understanding.

## 2022-07-25 NOTE — Progress Notes (Signed)
V2.0    [facility] Progress Note      Name:  Ricardo Foley DOB/Age/Sex: 06-14-1960  (62 y.o. male)   MRN & CSN:  [id number] & [id number] Encounter Date/Time: 07/25/2022 7:28 AM EDT   Location:  3018/3018-A PCP: Oval Linsey, APRN - CNP     Attending:Corine Solorio, MD       Hospital Day: 6    Assessment and Recommendations       Plan:   Patient is a 61 year old male who presented with neck pain.      # Cervical myelopathy   - Endorsed neck pain with bilateral arm (L>R) and leg numbness. Ambulates with cane, but unsteady gait. No falls or recent injury/trauma. No saddle anesthesia or B/B incontinence. Recent MRI C/L-spine showed severe central canal stenosis of C3-C4 and C4-C5 with DDD and spondylosis. Sent from NSGY clinic for potential decompression.   - MRI T-spine nonacute. CTA nonacute.   - Plan for surgical intervention on 7/3. Continue holding Plavix. Supportive care.      # AKI, mild  - Clinically hypovolemic. Cr 1.4 on admission, baseline ~ 0.8. UA negative.  - Will challenge with IVF. Strict I/O's. Bladder scan if decreased voiding. Avoid NSAIDs.     # CAD  # Non-MI troponin elevation  # PVD  - Denied any typical CP. LHC in 07/2017 showed severe dLCx disease, not amenable to PCI.   - Initial Tn mildly elevated, repeat flat. ECG without acute ischemic changes.  - Likely secondary to decreased renal clearance. Hold ASA and Plavix for surgery, continue Lipitor.      # Essential hypertension  - Hold Coreg in setting of borderline hypotension.     # Hyperkalemia  - Mild, <5.6.  - Follow-up repeat labs.     # Hypomagnesemia  - Mild, repleted.  - Follow-up repeat labs.     # Leukocytosis  - Chronic, no fevers, LA normal.   - Monitor labs.      # Elevated lipase  - Denied any typical abdominal pain.   - CT non-acute.   - Not consistent with pancreatitis.      # T2DM with hyperglycemia  - Last A1c 6.4% on admission.   - Held Metformin.  - LCSI.     # Tobacco abuse  - Hx of smoking 1.5 PPD for over 40 years.  - Advised  on importance of complete cessation.  - Nicotine patches prn.    Diet Diet NPO Exceptions are: Ice Chips, Sips of Water with Meds  ADULT DIET; Regular; 3 carb choices (45 gm/meal)  Diet NPO Exceptions are: Ice Chips, Sips of Water with Meds   DVT Prophylaxis []  Lovenox, []   Heparin, []  SCDs, []  Ambulation,  []  Eliquis, []  Xarelto  []  Coumadin   Code Status Full Code   Disposition From: Home  Expected Disposition: TBD  Estimated Date of Discharge: TBD  Patient requires continued admission due to surgical intervention on 7/3   Surrogate Decision Maker/ POA       Personally reviewed Lab Studies and Imaging           Subjective:     Chief Complaint:     The patient was seen at bedside. All questions answered at bedside. NAEON. Tolerating diet. No BM last night. Good UOP.  N.p.o. at midnight for consideration of intervention tomorrow    Review of Systems:      Pertinent positives and negatives discussed in HPI    Objective:  Intake/Output Summary (Last 24 hours) at 07/25/2022 1057  Last data filed at 07/25/2022 0900  Gross per 24 hour   Intake 360 ml   Output 1750 ml   Net -1390 ml        Vitals:   Vitals:    07/25/22 0541 07/25/22 0600 07/25/22 0611 07/25/22 0745   BP:    (!) 142/86   Pulse:    65   Resp: 18  17 18    Temp:    97.7 F (36.5 C)   TempSrc:    Oral   SpO2:    97%   Weight:  71 kg (156 lb 8.4 oz)     Height:             Physical Exam:      BMI: There is no height or weight on file to calculate BMI.  General: Awake.     HEENT: PERRLA. Vision grossly intact. Normal hearing. Oropharynx clear.   Neck: Supple. No JVD.   CV: RRR. NL S1/S2. No BLE edema.   Pulm: NL effort on RA. CTAB.   GI: +BS x4. Soft. NT/ND.  GU: No CVA tenderness. No Foley catheter.  Skin: Intact, warm and dry.  MSK: No gross joint deformities. Full ROM.   Neuro: AAOx3. CNs grossly intact. Normal speech. No focal deficit. 5/5 strength. Right hand clonus. Normal sensation.   Psych: Good judgement and reason.        Medications:   Medications:     acetaminophen  650 mg Oral 4 times per day    [Held by provider] aspirin  81 mg Oral Daily    atorvastatin  40 mg Oral Nightly    [Held by provider] carvedilol  12.5 mg Oral BID    [Held by provider] clopidogrel  75 mg Oral Daily    DULoxetine  60 mg Oral Daily    ferrous sulfate  325 mg Oral Daily    gabapentin  400 mg Oral TID    lactobacillus  1 capsule Oral Daily    linaclotide  145 mcg Oral Daily    pantoprazole  40 mg Oral BID AC    tamsulosin  0.4 mg Oral Daily    tiZANidine  4 mg Oral TID    insulin lispro  0-4 Units SubCUTAneous TID WC    insulin lispro  0-4 Units SubCUTAneous Nightly    sodium chloride flush  5-40 mL IntraVENous 2 times per day    heparin (porcine)  5,000 Units SubCUTAneous 3 times per day    traZODone  150 mg Oral Nightly      Infusions:    dextrose      sodium chloride       PRN Meds: oxyCODONE, 5 mg, Q4H PRN   Or  oxyCODONE, 10 mg, Q4H PRN  albuterol sulfate HFA, 2 puff, Q6H PRN  hydrOXYzine pamoate, 50 mg, TID PRN  glucose, 4 tablet, PRN  dextrose bolus, 125 mL, PRN   Or  dextrose bolus, 250 mL, PRN  glucagon (rDNA), 1 mg, PRN  dextrose, , Continuous PRN  sodium chloride flush, 5-40 mL, PRN  sodium chloride, , PRN  potassium chloride, 10 mEq, PRN  magnesium sulfate, 2,000 mg, PRN  ondansetron, 4 mg, Q8H PRN   Or  ondansetron, 4 mg, Q6H PRN  melatonin, 3 mg, Nightly PRN  polyethylene glycol, 17 g, Daily PRN  nicotine, 1 patch, Daily PRN        Labs and Imaging   CTA CHEST  ABDOMEN PELVIS W CONTRAST    Result Date: 07/20/2022  EXAMINATION: CTA CHEST ABDOMEN PELVIS W CONTRAST 07/20/2022 5:44 PM ACCESSION NUMBER: [id number] COMPARISON: None available INDICATION: abdominal pain, back pain, elevaed d dimer, hypotension TECHNIQUE: Dedicated CT angiographic images were obtained from the lung apices to the symphysis pubis following administration Iso-vue .  Additional delayed images were obtained through the abdomen and pelvis.  Multiplanar reformats and 3-D reprocessing was utilized for  optimal assessment of the images. Radiation dose reduction techniques were used for this study. Our CT scanners use one or all of the following: Automated exposure control, adjustment of the mA and/or kV according to patient size, iterative reconstruction. FINDINGS: AIRWAYS: The central airways are patent. LUNGS: The lungs are clear bilaterally.  No suspicious pulmonary nodules. PLEURA: No pleural effusion or pneumothorax. HEART: The heart is not enlarged. No calcified coronary atherosclerosis.  No pericardial effusion. THORACIC AORTA: The aorta is normal in caliber.  No evidence of aortic dissection, intramural hematoma, or atherosclerotic ulcer. PULMONARY ARTERY: No pulmonary embolus to the segmental level. The main pulmonary artery is normal in caliber. MEDIASTINUM/HILA: No mediastinal mass or lymphadenopathy. CHEST WALL: No mass or axillary lymphadenopathy. LIVER: The liver contour is normal. No suspicious liver lesion. BILIARY TREE: The gallbladder is within normal limits. No biliary dilation. SPLEEN: Normal. PANCREAS: No pancreatic mass or ductal dilation. ADRENALS: Normal. KIDNEYS/BLADDER: The kidneys are symmetric in size. No renal calculus or hydronephrosis. No renal mass. The urinary bladder is unremarkable. BOWEL: The colon is unremarkable. The small bowel is normal in caliber. No bowel wall thickening. PERITONEUM/RETROPERITONEUM: No ascites or free air. No pelvic or retroperitoneal lymphadenopathy. VESSELS: No abdominal aortic aneurysm or dissection. Mild atherosclerotic disease in the aorta. The celiac, SMA, and IMA are patent without significant stenosis. The renal arteries are patent without flow-limiting stenosis. The bilateral iliac arteries are patent without flow-limiting stenosis. ABDOMINAL WALL: No hernia or mass. REPRODUCTIVE: Unremarkable. BONES: No suspicious osseous lesion.     No aortic dissection or aneurysm. Electronically signed by Alycia Rossetti    MRI THORACIC SPINE WO CONTRAST    Result  Date: 07/20/2022  EXAMINATION: MRI THORACIC SPINE WO CONTRAST 07/20/2022 5:38 PM ACCESSION NUMBER: [id number] COMPARISON: None available INDICATION: Evaluate myelopathy TECHNIQUE: Multisequence, multiplanar MR images were obtained of the thoracic spine without the administration of intravenous contrast. FINDINGS: The bone marrow has normal signal characteristics with scattered areas of fatty marrow replacement. Spinal canal is patent. Spinal cord has normal signal. No epidural mass or hematoma. The disc spaces and facet joints are grossly intact throughout the thoracic spine. The surrounding soft tissues are unremarkable.     No acute findings in the thoracic spine. Electronically signed by Alycia Rossetti    XR CHEST PORTABLE    Result Date: 07/20/2022  Chest X-ray INDICATION: Hypotension COMPARISON: November 07, 2018 TECHNIQUE: AP/PA view of the chest was obtained. FINDINGS: The lungs are clear. There are no infiltrates or effusions.  The heart size is normal.  The bony thorax is intact.      No acute findings in the chest Electronically signed by Berdine Addison      CBC:   Recent Labs     07/25/22  0151   WBC 15.1*   HGB 12.7*   PLT 283       BMP:    Recent Labs     07/25/22  0151   NA 140   K 4.8   CL 102   CO2 25   BUN 15  CREATININE 1.0   GLUCOSE 164*       Hepatic:   Recent Labs     07/25/22  0151   AST 11*   ALT 11   BILITOT 0.2   ALKPHOS 64       Lipids:   Lab Results   Component Value Date/Time    CHOL 90 06/11/2018 02:19 AM    HDL 56 07/12/2022 04:07 PM    TRIG 158 06/11/2018 02:19 AM     Hemoglobin A1C:   Lab Results   Component Value Date/Time    LABA1C 6.4 07/20/2022 04:39 PM     TSH:   Lab Results   Component Value Date/Time    TSH 1.42 03/12/2017 03:00 PM     Troponin:   Lab Results   Component Value Date/Time    TROPONINT <0.010 11/07/2018 10:19 AM    TROPONINT <0.010 03/18/2018 05:14 PM    TROPONINT <0.010 09/08/2017 06:43 PM     Lactic Acid: No results for input(s): "LACTA" in the last 72 hours.  BNP: No  results for input(s): "PROBNP" in the last 72 hours.  UA:  Lab Results   Component Value Date/Time    NITRU NEGATIVE 07/24/2022 03:56 PM    COLORU YELLOW 07/24/2022 03:56 PM    PHUR 6.5 07/24/2022 03:56 PM    WBCUA NO CELLS SEEN 05/10/2019 01:46 PM    RBCUA 6 05/10/2019 01:46 PM    MUCUS RARE 06/10/2018 06:50 PM    TRICHOMONAS NONE SEEN 06/10/2018 06:50 PM    BACTERIA NEGATIVE 05/10/2019 01:46 PM    CLARITYU CLEAR 07/24/2022 03:56 PM    LEUKOCYTESUR NEGATIVE 07/24/2022 03:56 PM    UROBILINOGEN 0.2 07/24/2022 03:56 PM    BILIRUBINUR NEGATIVE 07/24/2022 03:56 PM    BLOODU NEGATIVE 07/24/2022 03:56 PM    GLUCOSEU NEGATIVE 07/24/2022 03:56 PM    KETUA NEGATIVE 07/24/2022 03:56 PM     Urine Cultures: No results found for: "LABURIN"  Blood Cultures: No results found for: "BC"  No results found for: "BLOODCULT2"  Organism: No results found for: "ORG"      Electronically signed by Sandy Salaam, MD on 07/25/2022 at 10:57 AM

## 2022-07-26 ENCOUNTER — Inpatient Hospital Stay: Admit: 2022-07-26 | Payer: PRIVATE HEALTH INSURANCE | Primary: Family

## 2022-07-26 ENCOUNTER — Inpatient Hospital Stay: Admit: 2022-07-27 | Payer: PRIVATE HEALTH INSURANCE | Primary: Family

## 2022-07-26 ENCOUNTER — Inpatient Hospital Stay: Admit: 2022-07-26 | Discharge: 2022-07-31 | Payer: PRIVATE HEALTH INSURANCE | Primary: Family

## 2022-07-26 LAB — NM STRESS TEST WITH MYOCARDIAL PERFUSION
Baseline Diastolic BP: 69 mmHg
Baseline HR: 80 {beats}/min
Baseline Systolic BP: 149 mmHg
Body Surface Area: 1.8 m2
Nuc Stress EF: 45 %
Stress Diastolic BP: 68 mmHg
Stress Estimated Workload: 1 METS
Stress Peak HR: 88 {beats}/min
Stress Percent HR Achieved: 55 %
Stress Rate Pressure Product: 14608 BPM*mmHg
Stress Systolic BP: 166 mmHg
Stress Target HR: 160 {beats}/min

## 2022-07-26 LAB — POCT GLUCOSE
POC Glucose: 207 MG/DL — ABNORMAL HIGH (ref 70–99)
POC Glucose: 138 MG/DL — ABNORMAL HIGH (ref 70–99)
POC Glucose: 142 MG/DL — ABNORMAL HIGH (ref 70–99)
POC Glucose: 128 MG/DL — ABNORMAL HIGH (ref 70–99)

## 2022-07-26 LAB — TYPE AND SCREEN
ABO/Rh: B POS
Antibody Screen: NEGATIVE

## 2022-07-26 MED ORDER — SODIUM CHLORIDE (PF) 0.9 % IJ SOLN
0.9 % | INTRAMUSCULAR | Status: DC | PRN
Start: 2022-07-26 — End: 2022-07-26

## 2022-07-26 MED ORDER — HYDROMORPHONE HCL 1 MG/ML IJ SOLN: 1 MG/ML | INTRAMUSCULAR | Status: AC

## 2022-07-26 MED ORDER — GLYCOPYRROLATE 1 MG/5ML IJ SOLN: 1 MG/5ML | INTRAMUSCULAR | Status: AC

## 2022-07-26 MED ORDER — SUCCINYLCHOLINE CHLORIDE 100 MG/5ML IV SOSY
100 | INTRAVENOUS | Status: DC | PRN
Start: 2022-07-26 — End: 2022-07-26
  Administered 2022-07-26: 18:00:00 120 via INTRAVENOUS

## 2022-07-26 MED ORDER — REGADENOSON 0.4 MG/5ML IV SOLN: 0.4 MG/5ML | INTRAVENOUS | Status: AC

## 2022-07-26 MED ORDER — PROPOFOL 200 MG/20ML IV EMUL
200 | INTRAVENOUS | Status: DC | PRN
Start: 2022-07-26 — End: 2022-07-26
  Administered 2022-07-26: 18:00:00 50 via INTRAVENOUS
  Administered 2022-07-26: 18:00:00 100 via INTRAVENOUS

## 2022-07-26 MED ORDER — MIDAZOLAM HCL 2 MG/2ML IJ SOLN: 2 MG/2ML | INTRAMUSCULAR | Status: AC

## 2022-07-26 MED ORDER — KETAMINE HCL 50 MG/ML IJ SOSY: 50 MG/ML | INTRAMUSCULAR | Status: AC

## 2022-07-26 MED ORDER — FENTANYL CITRATE (PF) 100 MCG/2ML IJ SOLN
100 | INTRAMUSCULAR | Status: DC | PRN
Start: 2022-07-26 — End: 2022-07-26
  Administered 2022-07-26 (×2): 50 via INTRAVENOUS

## 2022-07-26 MED ORDER — HYDROMORPHONE HCL 1 MG/ML IJ SOLN
1 | INTRAMUSCULAR | Status: AC | PRN
Start: 2022-07-26 — End: 2022-07-27
  Administered 2022-07-26 – 2022-07-27 (×4): 0.5 mg via INTRAVENOUS

## 2022-07-26 MED ORDER — NORMAL SALINE FLUSH 0.9 % IV SOLN
0.9 | Freq: Two times a day (BID) | INTRAVENOUS | Status: DC
Start: 2022-07-26 — End: 2022-07-26

## 2022-07-26 MED ORDER — DROPERIDOL 2.5 MG/ML IJ SOLN
2.5 | Freq: Once | INTRAMUSCULAR | Status: DC | PRN
Start: 2022-07-26 — End: 2022-07-26

## 2022-07-26 MED ORDER — ONDANSETRON HCL 4 MG/2ML IJ SOLN: 4 MG/2ML | INTRAMUSCULAR | Status: AC

## 2022-07-26 MED ORDER — ONDANSETRON 4 MG PO TBDP
4 | Freq: Three times a day (TID) | ORAL | Status: DC | PRN
Start: 2022-07-26 — End: 2022-07-27

## 2022-07-26 MED ORDER — SODIUM CHLORIDE 0.9 % IV SOLN
0.9 | INTRAVENOUS | Status: DC | PRN
Start: 2022-07-26 — End: 2022-07-26

## 2022-07-26 MED ORDER — REMIFENTANIL HCL 2 MG IV SOLR
2 MG | INTRAVENOUS | Status: DC | PRN
Start: 2022-07-26 — End: 2022-07-26
  Administered 2022-07-26: 18:00:00 .2 via INTRAVENOUS

## 2022-07-26 MED ORDER — HYDROMORPHONE HCL 1 MG/ML IJ SOLN
1 | INTRAMUSCULAR | Status: DC | PRN
Start: 2022-07-26 — End: 2022-07-26
  Administered 2022-07-26 (×2): .25 via INTRAVENOUS

## 2022-07-26 MED ORDER — HYDRALAZINE HCL 20 MG/ML IJ SOLN
20 | INTRAMUSCULAR | Status: DC | PRN
Start: 2022-07-26 — End: 2022-07-26

## 2022-07-26 MED ORDER — REMIFENTANIL HCL 1 MG IV SOLR: 1 MG | INTRAVENOUS | Status: AC

## 2022-07-26 MED ORDER — LIDOCAINE HCL (CARDIAC) PF 100 MG/5ML IV SOLN: 100 MG/5ML | INTRAVENOUS | Status: AC

## 2022-07-26 MED ORDER — ONDANSETRON HCL 4 MG/2ML IJ SOLN
4 | Freq: Four times a day (QID) | INTRAMUSCULAR | Status: DC | PRN
Start: 2022-07-26 — End: 2022-07-27

## 2022-07-26 MED ORDER — LABETALOL HCL 5 MG/ML IV SOLN
5 | INTRAVENOUS | Status: DC | PRN
Start: 2022-07-26 — End: 2022-07-26
  Administered 2022-07-26: 21:00:00 10 mg via INTRAVENOUS

## 2022-07-26 MED ORDER — HYDROMORPHONE HCL 1 MG/ML IJ SOLN
1 | INTRAMUSCULAR | Status: AC | PRN
Start: 2022-07-26 — End: 2022-07-26
  Administered 2022-07-26 (×2): 0.25 mg via INTRAVENOUS

## 2022-07-26 MED ORDER — ONDANSETRON HCL 4 MG/2ML IJ SOLN
4 | INTRAMUSCULAR | Status: DC | PRN
Start: 2022-07-26 — End: 2022-07-26
  Administered 2022-07-26: 20:00:00 4 via INTRAVENOUS

## 2022-07-26 MED ORDER — TECHNETIUM TC 99M SESTAMIBI IV KIT
Freq: Once | INTRAVENOUS | Status: AC | PRN
Start: 2022-07-26 — End: 2022-07-26
  Administered 2022-07-26: 12:00:00 33 via INTRAVENOUS

## 2022-07-26 MED ORDER — PHENYLEPHRINE HCL 10 MG/ML SOLN (MIXTURES ONLY)
10 | Status: DC | PRN
Start: 2022-07-26 — End: 2022-07-26
  Administered 2022-07-26: 18:00:00 300 via INTRAVENOUS
  Administered 2022-07-26 (×2): 100 via INTRAVENOUS

## 2022-07-26 MED ORDER — NORMAL SALINE FLUSH 0.9 % IV SOLN
0.9 | INTRAVENOUS | Status: DC | PRN
Start: 2022-07-26 — End: 2022-07-26

## 2022-07-26 MED ORDER — ACETAMINOPHEN 325 MG PO TABS
325 | Freq: Four times a day (QID) | ORAL | Status: DC
Start: 2022-07-26 — End: 2022-07-27
  Administered 2022-07-27 (×2): 650 mg via ORAL

## 2022-07-26 MED ORDER — SUCCINYLCHOLINE CHLORIDE 100 MG/5ML IV SOSY: 100 MG/5ML | INTRAVENOUS | Status: AC

## 2022-07-26 MED ORDER — CEFAZOLIN SODIUM 2 G SOLR (MIXTURES ONLY)
2 g | Status: AC
Start: 2022-07-26 — End: 2022-07-26
  Administered 2022-07-26: 18:00:00 2000 mg via INTRAVENOUS

## 2022-07-26 MED ORDER — GLYCOPYRROLATE 1 MG/5ML IJ SOLN
1 | INTRAMUSCULAR | Status: DC | PRN
Start: 2022-07-26 — End: 2022-07-26
  Administered 2022-07-26: 18:00:00 .4 via INTRAVENOUS

## 2022-07-26 MED ORDER — TECHNETIUM TC 99M SESTAMIBI IV KIT
Freq: Once | INTRAVENOUS | Status: AC | PRN
Start: 2022-07-26 — End: 2022-07-26
  Administered 2022-07-26: 11:00:00 11 via INTRAVENOUS

## 2022-07-26 MED ORDER — PROPOFOL 200 MG/20ML IV EMUL: 200 MG/20ML | INTRAVENOUS | Status: AC

## 2022-07-26 MED ORDER — STERILE WATER FOR INJECTION (MIXTURES ONLY)
2 g | Freq: Three times a day (TID) | Status: AC
Start: 2022-07-26 — End: 2022-07-27
  Administered 2022-07-27 (×2): 2000 mg via INTRAVENOUS

## 2022-07-26 MED ORDER — NOREPINEPHRINE BITARTRATE 1 MG/ML IV SOLN
1 | INTRAVENOUS | Status: DC | PRN
Start: 2022-07-26 — End: 2022-07-26
  Administered 2022-07-26: 18:00:00 .04 via INTRAVENOUS

## 2022-07-26 MED ORDER — NORMAL SALINE FLUSH 0.9 % IV SOLN
0.9 | INTRAVENOUS | Status: DC | PRN
Start: 2022-07-26 — End: 2022-07-27

## 2022-07-26 MED ORDER — PROPOFOL 1000 MG/100ML IV EMUL
1000 | INTRAVENOUS | Status: DC | PRN
Start: 2022-07-26 — End: 2022-07-26
  Administered 2022-07-26: 18:00:00 150 via INTRAVENOUS

## 2022-07-26 MED ORDER — PROPOFOL 1000 MG/100ML IV EMUL: 1000 MG/100ML | INTRAVENOUS | Status: AC

## 2022-07-26 MED ORDER — DEXAMETHASONE SODIUM PHOSPHATE 4 MG/ML IJ SOLN: 4 MG/ML | INTRAMUSCULAR | Status: AC

## 2022-07-26 MED ORDER — OXYCODONE HCL 5 MG PO TABS
5 | Freq: Once | ORAL | Status: DC | PRN
Start: 2022-07-26 — End: 2022-07-26

## 2022-07-26 MED ORDER — FENTANYL CITRATE (PF) 100 MCG/2ML IJ SOLN
100 | INTRAMUSCULAR | Status: AC | PRN
Start: 2022-07-26 — End: 2022-07-26
  Administered 2022-07-26 (×4): 50 ug via INTRAVENOUS

## 2022-07-26 MED ORDER — MIDAZOLAM HCL 2 MG/2ML IJ SOLN
2 | INTRAMUSCULAR | Status: DC | PRN
Start: 2022-07-26 — End: 2022-07-26
  Administered 2022-07-26: 18:00:00 2 via INTRAVENOUS

## 2022-07-26 MED ORDER — SODIUM CHLORIDE 0.9 % IV SOLN
0.9 | INTRAVENOUS | Status: DC | PRN
Start: 2022-07-26 — End: 2022-07-27

## 2022-07-26 MED ORDER — MEPERIDINE HCL 25 MG/ML IJ SOLN
25 | INTRAMUSCULAR | Status: DC | PRN
Start: 2022-07-26 — End: 2022-07-26

## 2022-07-26 MED ORDER — LIDOCAINE HCL (CARDIAC) PF 100 MG/5ML IV SOLN
100 | INTRAVENOUS | Status: DC | PRN
Start: 2022-07-26 — End: 2022-07-26
  Administered 2022-07-26: 18:00:00 100 via INTRAVENOUS

## 2022-07-26 MED ORDER — ONDANSETRON HCL 4 MG/2ML IJ SOLN
4 | Freq: Once | INTRAMUSCULAR | Status: DC | PRN
Start: 2022-07-26 — End: 2022-07-26

## 2022-07-26 MED ORDER — LACTATED RINGERS IV SOLN
INTRAVENOUS | Status: DC
Start: 2022-07-26 — End: 2022-07-26
  Administered 2022-07-26: 17:00:00 via INTRAVENOUS

## 2022-07-26 MED ORDER — KETAMINE HCL 50 MG/ML IJ SOSY
50 | INTRAMUSCULAR | Status: DC | PRN
Start: 2022-07-26 — End: 2022-07-26
  Administered 2022-07-26: 18:00:00 20 via INTRAVENOUS
  Administered 2022-07-26: 19:00:00 10 via INTRAVENOUS
  Administered 2022-07-26: 20:00:00 20 via INTRAVENOUS

## 2022-07-26 MED ORDER — NORMAL SALINE FLUSH 0.9 % IV SOLN
0.9 | Freq: Two times a day (BID) | INTRAVENOUS | Status: DC
Start: 2022-07-26 — End: 2022-07-27
  Administered 2022-07-27 (×2): 10 mL via INTRAVENOUS

## 2022-07-26 MED ORDER — FENTANYL CITRATE (PF) 100 MCG/2ML IJ SOLN: 100 MCG/2ML | INTRAMUSCULAR | Status: AC

## 2022-07-26 MED ORDER — LACTATED RINGERS IV SOLN
INTRAVENOUS | Status: DC | PRN
Start: 2022-07-26 — End: 2022-07-26
  Administered 2022-07-26: 18:00:00 via INTRAVENOUS

## 2022-07-26 MED ORDER — REGADENOSON 0.4 MG/5ML IV SOLN
0.4 | Freq: Once | INTRAVENOUS | Status: AC | PRN
Start: 2022-07-26 — End: 2022-07-26
  Administered 2022-07-26: 12:00:00 0.4 mg via INTRAVENOUS

## 2022-07-26 MED ORDER — DEXAMETHASONE SODIUM PHOSPHATE 4 MG/ML IJ SOLN
4 | INTRAMUSCULAR | Status: DC | PRN
Start: 2022-07-26 — End: 2022-07-26
  Administered 2022-07-26: 18:00:00 12 via INTRAVENOUS

## 2022-07-26 MED FILL — LEXISCAN 0.4 MG/5ML IV SOLN: 0.4 MG/5ML | INTRAVENOUS | Qty: 5

## 2022-07-26 MED FILL — SUCCINYLCHOLINE CHLORIDE 100 MG/5ML IV SOSY: 100 MG/5ML | INTRAVENOUS | Qty: 5

## 2022-07-26 MED FILL — OXYCODONE HCL 5 MG PO TABS: 5 MG | ORAL | Qty: 1

## 2022-07-26 MED FILL — CEFAZOLIN SODIUM 2 G IJ SOLR: 2 g | INTRAMUSCULAR | Qty: 2000

## 2022-07-26 MED FILL — OXYCODONE HCL 10 MG PO TABS: 10 MG | ORAL | Qty: 1

## 2022-07-26 MED FILL — DIPRIVAN 1000 MG/100ML IV EMUL: 1000 MG/100ML | INTRAVENOUS | Qty: 200

## 2022-07-26 MED FILL — DILAUDID 1 MG/ML IJ SOLN: 1 MG/ML | INTRAMUSCULAR | Qty: 0.5

## 2022-07-26 MED FILL — ACETAMINOPHEN 325 MG PO TABS: 325 MG | ORAL | Qty: 2

## 2022-07-26 MED FILL — BD POSIFLUSH 0.9 % IV SOLN: 0.9 % | INTRAVENOUS | Qty: 40

## 2022-07-26 MED FILL — LACTATED RINGERS IV SOLN: INTRAVENOUS | Qty: 500

## 2022-07-26 MED FILL — LIDOCAINE HCL (CARDIAC) PF 100 MG/5ML IV SOLN: 100 MG/5ML | INTRAVENOUS | Qty: 5

## 2022-07-26 MED FILL — DEXAMETHASONE SODIUM PHOSPHATE 4 MG/ML IJ SOLN: 4 MG/ML | INTRAMUSCULAR | Qty: 1

## 2022-07-26 MED FILL — DIPRIVAN 200 MG/20ML IV EMUL: 200 MG/20ML | INTRAVENOUS | Qty: 20

## 2022-07-26 MED FILL — FENTANYL CITRATE (PF) 100 MCG/2ML IJ SOLN: 100 MCG/2ML | INTRAMUSCULAR | Qty: 2

## 2022-07-26 MED FILL — GLYCOPYRROLATE 1 MG/5ML IJ SOLN: 1 MG/5ML | INTRAMUSCULAR | Qty: 5

## 2022-07-26 MED FILL — ONDANSETRON HCL 4 MG/2ML IJ SOLN: 4 MG/2ML | INTRAMUSCULAR | Qty: 2

## 2022-07-26 MED FILL — KETAMINE HCL 50 MG/ML IJ SOSY: 50 MG/ML | INTRAMUSCULAR | Qty: 1

## 2022-07-26 MED FILL — GABAPENTIN 400 MG PO CAPS: 400 MG | ORAL | Qty: 1

## 2022-07-26 MED FILL — ULTIVA 1 MG IV SOLR: 1 MG | INTRAVENOUS | Qty: 2000

## 2022-07-26 MED FILL — MIDAZOLAM HCL 2 MG/2ML IJ SOLN: 2 MG/ML | INTRAMUSCULAR | Qty: 2

## 2022-07-26 MED FILL — PANTOPRAZOLE SODIUM 40 MG PO TBEC: 40 MG | ORAL | Qty: 1

## 2022-07-26 MED FILL — TRAZODONE HCL 50 MG PO TABS: 50 MG | ORAL | Qty: 3

## 2022-07-26 MED FILL — LABETALOL HCL 5 MG/ML IV SOLN: 5 MG/ML | INTRAVENOUS | Qty: 4

## 2022-07-26 MED FILL — TIZANIDINE HCL 4 MG PO TABS: 4 MG | ORAL | Qty: 1

## 2022-07-26 MED FILL — LIPITOR 40 MG PO TABS: 40 MG | ORAL | Qty: 1

## 2022-07-26 NOTE — Plan of Care (Signed)
Problem: Discharge Planning  Goal: Discharge to home or other facility with appropriate resources  Outcome: Progressing     Problem: Safety - Adult  Goal: Free from fall injury  Outcome: Progressing     Problem: Musculoskeletal - Adult  Goal: Return mobility to safest level of function  Outcome: Progressing  Goal: Return ADL status to a safe level of function  Outcome: Progressing     Problem: Metabolic/Fluid and Electrolytes - Adult  Goal: Electrolytes maintained within normal limits  Outcome: Progressing  Goal: Glucose maintained within prescribed range  Outcome: Progressing     Problem: Chronic Conditions and Co-morbidities  Goal: Patient's chronic conditions and co-morbidity symptoms are monitored and maintained or improved  Outcome: Progressing     Problem: Pain  Goal: Verbalizes/displays adequate comfort level or baseline comfort level  Outcome: Progressing  Flowsheets (Taken 07/26/2022 1710 by Donato Schultz, RN)  Verbalizes/displays adequate comfort level or baseline comfort level: Encourage patient to monitor pain and request assistance

## 2022-07-26 NOTE — Anesthesia Post-Procedure Evaluation (Signed)
Department of Anesthesiology  Postprocedure Note    Patient: Ricardo Foley  MRN: [id number]  Birthdate: January 25, 1960  Date of evaluation: 07/26/2022    Procedure Summary       Date: 07/26/22 Room / Location: [facility] OR 03 / [facility]    Anesthesia Start: 1332 Anesthesia Stop: 1609    Procedure: C3-C6 CERVICAL DISCECTOMY FUSION ANTERIOR THREE+ LEVELS (Neck) Diagnosis:       Myelopathy concurrent with and due to spinal stenosis of cervical region [facility])      (Myelopathy concurrent with and due to spinal stenosis of cervical region [facility]) [X91.47, G99.2])    Surgeons: Sandria Manly, MD Responsible Provider: Sabra Heck, MD    Anesthesia Type: general ASA Status: 3            Anesthesia Type: No value filed.    Aldrete Phase I: Aldrete Score: 10    Aldrete Phase II:      Anesthesia Post Evaluation    Patient location during evaluation: PACU  Patient participation: complete - patient participated  Level of consciousness: awake  Pain score: 1  Airway patency: patent  Nausea & Vomiting: no nausea and no vomiting  Cardiovascular status: hemodynamically stable  Respiratory status: nasal cannula  Hydration status: euvolemic  Multimodal analgesia pain management approach    No notable events documented.

## 2022-07-26 NOTE — Progress Notes (Signed)
Pt bp 189/101 and c/o pain 10/10 this nurse notified hospitalist. Nurse gave pain meds per order.

## 2022-07-26 NOTE — Anesthesia Pre-Procedure Evaluation (Signed)
Department of Anesthesiology  Preprocedure Note       Name:  Ricardo Foley   Age:  62 y.o.  DOB:  June 30, 1960                                          MRN:  [id number]         Date:  07/26/2022      Surgeon: [name]):  Sandria Manly, MD    Procedure: Procedure(s):  C3-C6 CERVICAL DISCECTOMY FUSION ANTERIOR THREE+ LEVELS    Medications prior to admission:   Prior to Admission medications    Medication Sig Start Date End Date Taking? Authorizing Provider   traZODone (DESYREL) 150 MG tablet Take 1 tablet by mouth nightly   Yes [provider]   oxyCODONE-acetaminophen (PERCOCET) 7.5-325 MG per tablet Take 1 tablet by mouth every 6 hours as needed for Pain.  Patient not taking: Reported on 07/20/2022    [provider]   ondansetron (ZOFRAN) 4 MG tablet Take 1 tablet by mouth daily as needed for Nausea or Vomiting 05/10/19   Iran Ouch, MD   carvedilol (COREG) 12.5 MG tablet Take 1 tablet by mouth 2 times daily 10/01/18   [provider]   FEROSUL 325 (65 Fe) MG tablet Take 325 mg by mouth daily  Patient not taking: Reported on 07/20/2022 10/07/18   [provider]   lactobacillus (CULTURELLE) CAPS capsule Take 1 capsule by mouth daily  Patient not taking: Reported on 07/20/2022 10/01/18   [provider]   LINZESS 145 MCG capsule Take 145 mcg by mouth daily  Patient not taking: Reported on 07/20/2022 09/05/18   [provider]   clopidogrel (PLAVIX) 75 MG tablet Take 75 mg by mouth daily    [provider]   dicyclomine (BENTYL) 10 MG capsule Take 1 capsule by mouth every 6 hours as needed (Bowel spasms) 03/18/18 07/20/22  Alveria Apley B, DO   aspirin 81 MG chewable tablet Take 1 tablet by mouth daily 03/05/18   Laurita Quint, APRN - CNP   DULoxetine (CYMBALTA) 60 MG extended release capsule Take 1 capsule by mouth daily    [provider]   gabapentin (NEURONTIN) 400 MG capsule Take 1 capsule by mouth 3 times daily.    [provider]    pantoprazole (PROTONIX) 40 MG tablet Take 1 tablet by mouth 2 times daily    [provider]   B Complex-C-Folic Acid (DIALYVITE TABLET) TABS Take 1 tablet by mouth daily  Patient not taking: Reported on 07/20/2022    [provider]   tamsulosin (FLOMAX) 0.4 MG capsule Take 1 capsule by mouth daily    [provider]   albuterol sulfate HFA 108 (90 Base) MCG/ACT inhaler Inhale 2 puffs into the lungs every 6 hours as needed for Wheezing    [provider]   tiZANidine (ZANAFLEX) 4 MG tablet Take 1 tablet by mouth every 8 hours as needed    [provider]   lisinopril (PRINIVIL;ZESTRIL) 20 MG tablet Take 20 mg by mouth daily  Patient not taking: Reported on 07/20/2022    [provider]   metFORMIN (GLUCOPHAGE) 1000 MG tablet Take 1 tablet by mouth 2 times daily (with meals)    [provider]   hydrOXYzine (VISTARIL) 50 MG capsule Take 1 capsule by mouth 3 times daily as  needed for Itching    [provider]   atorvastatin (LIPITOR) 40 MG tablet Take 1 tablet by mouth nightly    [provider]       Current medications:    Current Facility-Administered Medications   Medication Dose Route Frequency Provider Last Rate Last Admin   . lactated ringers IV soln infusion   IntraVENous Continuous Sandria Manly, MD       . ceFAZolin (ANCEF) 2,000 mg in sterile water 20 mL IV syringe  2,000 mg IntraVENous On Call to OR Sandria Manly, MD       . oxyCODONE (ROXICODONE) immediate release tablet 5 mg  5 mg Oral Q4H PRN Marty Heck, APRN - CNP   5 mg at 07/26/22 0532    Or   . oxyCODONE HCl (OXY-IR) immediate release tablet 10 mg  10 mg Oral Q4H PRN Marty Heck, APRN - CNP   10 mg at 07/26/22 0954   . acetaminophen (TYLENOL) tablet 650 mg  650 mg Oral 4 times per day Hulan Saas, MD   650 mg at 07/25/22 2115   . albuterol sulfate HFA (PROVENTIL;VENTOLIN;PROAIR) 108 (90 Base) MCG/ACT inhaler 2 puff  2 puff Inhalation Q6H PRN  Hulan Saas, MD       . Lisbeth Ply by provider] aspirin chewable tablet 81 mg  81 mg Oral Daily Almusawi, Hussain, MD       . atorvastatin (LIPITOR) tablet 40 mg  40 mg Oral Nightly Hulan Saas, MD   40 mg at 07/25/22 2115   . [Held by provider] carvedilol (COREG) tablet 12.5 mg  12.5 mg Oral BID Hulan Saas, MD   12.5 mg at 07/20/22 2220   . [Held by provider] clopidogrel (PLAVIX) tablet 75 mg  75 mg Oral Daily Almusawi, Hussain, MD       . DULoxetine (CYMBALTA) extended release capsule 60 mg  60 mg Oral Daily Hulan Saas, MD   60 mg at 07/25/22 0916   . ferrous sulfate (IRON 325) tablet 325 mg  325 mg Oral Daily Hulan Saas, MD   325 mg at 07/25/22 0917   . gabapentin (NEURONTIN) capsule 400 mg  400 mg Oral TID Hulan Saas, MD   400 mg at 07/25/22 2117   . hydrOXYzine pamoate (VISTARIL) capsule 50 mg  50 mg Oral TID PRN Hulan Saas, MD   50 mg at 07/20/22 2219   . lactobacillus (CULTURELLE) capsule 1 capsule  1 capsule Oral Daily Hulan Saas, MD   1 capsule at 07/25/22 0916   . linaclotide (LINZESS) capsule 145 mcg  ++NON FORMULARY++ (Patient Supplied)  145 mcg Oral Daily Almusawi, Hussain, MD       . pantoprazole (PROTONIX) tablet 40 mg  40 mg Oral BID AC Almusawi, Hussain, MD   40 mg at 07/26/22 0531   . tamsulosin (FLOMAX) capsule 0.4 mg  0.4 mg Oral Daily Almusawi, Eula Listen, MD   0.4 mg at 07/25/22 0916   . tiZANidine (ZANAFLEX) tablet 4 mg  4 mg Oral TID Hulan Saas, MD   4 mg at 07/25/22 2115   . insulin lispro (HUMALOG,ADMELOG) injection vial 0-4 Units  0-4 Units SubCUTAneous TID WC Almusawi, Hussain, MD       . insulin lispro (HUMALOG,ADMELOG) injection vial 0-4 Units  0-4 Units SubCUTAneous Nightly Almusawi, Hussain, MD       . glucose chewable tablet 16 g  4 tablet Oral PRN Almusawi, Hussain, MD       . dextrose bolus 10% 125 mL  125 mL IntraVENous PRN Almusawi, Eula Listen, MD        Or   . dextrose bolus 10% 250 mL  250 mL IntraVENous PRN Almusawi, Hussain,  MD       . glucagon injection 1 mg  1 mg SubCUTAneous PRN Almusawi, Hussain, MD       . dextrose 10 % infusion   IntraVENous Continuous PRN Almusawi, Hussain, MD       . sodium chloride flush 0.9 % injection 5-40 mL  5-40 mL IntraVENous 2 times per day Hulan Saas, MD   10 mL at 07/25/22 2117   . sodium chloride flush 0.9 % injection 5-40 mL  5-40 mL IntraVENous PRN Almusawi, Hussain, MD       . 0.9 % sodium chloride infusion   IntraVENous PRN Almusawi, Hussain, MD       . potassium chloride 10 mEq/100 mL IVPB (Peripheral Line)  10 mEq IntraVENous PRN Almusawi, Hussain, MD       . magnesium sulfate 2000 mg in 50 mL IVPB premix  2,000 mg IntraVENous PRN Almusawi, Eula Listen, MD       . ondansetron (ZOFRAN-ODT) disintegrating tablet 4 mg  4 mg Oral Q8H PRN Almusawi, Hussain, MD        Or   . ondansetron (ZOFRAN) injection 4 mg  4 mg IntraVENous Q6H PRN Almusawi, Hussain, MD       . melatonin tablet 3 mg  3 mg Oral Nightly PRN Almusawi, Hussain, MD       . polyethylene glycol (GLYCOLAX) packet 17 g  17 g Oral Daily PRN Almusawi, Hussain, MD       . nicotine (NICODERM CQ) 21 MG/24HR 1 patch  1 patch TransDERmal Daily PRN Hulan Saas, MD       . Lisbeth Ply by provider] heparin (porcine) injection 5,000 Units  5,000 Units SubCUTAneous 3 times per day Hulan Saas, MD   5,000 Units at 07/25/22 1539   . traZODone (DESYREL) tablet 150 mg  150 mg Oral Nightly Marty Heck, APRN - CNP   150 mg at 07/25/22 2115       Allergies:  No Known Allergies    Problem List:    Patient Active Problem List   Diagnosis Code   . Chest pain R07.9   . Lower abdominal pain R10.30   . Anemia D64.9   . Abnormal stress test R94.39   . PAD (peripheral artery disease) (HCC) I73.9   . AKI (acute kidney injury) (HCC) N17.9   . Leukocytosis - chronic - patient reports CLL D72.829   . Pancreatitis, unspecified pancreatitis type K85.90   . Acute pancreatitis K85.90   . Pain of upper abdomen R10.10   . Paresthesia R20.2   . Cervical myelopathy  (HCC) G95.9   . ASCVD (arteriosclerotic cardiovascular disease) I25.10       Past Medical History:        Diagnosis Date   . CAD (coronary artery disease)    . Chronic back pain     Had shots August 2019   . Diabetes (HCC)     Diagnosed about 2009   . H/O cardiac catheterization 08/02/2017     severe single vessel CAD of the distal LCX which is small caliber and not amenable to PCI.   Marland Kitchen H/O cardiovascular stress test 07/03/2017    scan shows moderate size, severe intensity, non reversible perfusion defect in inferolateral wall, normal LVEF.   Marland Kitchen H/O Doppler lower arterial ultrasound 07/11/2017  The Right Proximal and mid SFA exhibits an occlusion. The Left SFA exhibits an occlusion between the mid and distal portions. Right ABIs show Severe peripheral arterial disease.   . H/O echocardiogram 05/30/2017    Left ventricular systolic function is normal. Ejection fraction is visually estimated at >55%.   Marland Kitchen Hyperlipidemia    . Hypertension    . IBS (irritable bowel syndrome)    . Nerve pain    . Pancreatitis    . PVD (peripheral vascular disease) (HCC) 08/02/2017     Severe lower extremity PAD. See abn Peripherial Angio. ( ABN Arterial US.07/11/17)   . Scoliosis        Past Surgical History:        Procedure Laterality Date   . CHG US GUIDANCE NEEDLE PLACEMENT IMG S&I         Social History:    Social History     Tobacco Use   . Smoking status: Every Day     Current packs/day: 1.00     Average packs/day: 1 pack/day for 50.5 years (50.5 ttl pk-yrs)     Types: Cigarettes     Start date: 18   . Smokeless tobacco: Never   Substance Use Topics   . Alcohol use: No     Comment: caffeine 1 cup of coffee a day                                Ready to quit: Not Answered  Counseling given: Not Answered      Vital Signs (Current):   Vitals:    07/26/22 0547 07/26/22 0602 07/26/22 0955 07/26/22 1104   BP:   (!) 171/88 (!) 141/67   Pulse:   67 64   Resp:  17 16 18    Temp:   97.7 F (36.5 C) (!) 96.5 F (35.8 C)   TempSrc:   Oral  Temporal   SpO2:   99% 95%   Weight: 70.5 kg (155 lb 7.9 oz)      Height:                                                  BP Readings from Last 3 Encounters:   07/26/22 (!) 141/67   05/19/19 99/66   05/10/19 123/82       NPO Status: Time of last liquid consumption: 0800                        Time of last solid consumption: 2000                        Date of last liquid consumption: 07/26/22                        Date of last solid food consumption: 07/25/22    BMI:   Wt Readings from Last 3 Encounters:   07/26/22 70.5 kg (155 lb 7.9 oz)   07/20/22 74.4 kg (164 lb)   05/19/19 79.9 kg (176 lb 3.2 oz)     Body mass index is 25.88 kg/m.    CBC:   Lab Results   Component Value Date/Time    WBC 15.1 07/25/2022 01:51 AM  RBC 4.47 07/25/2022 01:51 AM    HGB 12.7 07/25/2022 01:51 AM    HCT 38.9 07/25/2022 01:51 AM    MCV 87.0 07/25/2022 01:51 AM    RDW 17.2 07/25/2022 01:51 AM    PLT 283 07/25/2022 01:51 AM       CMP:   Lab Results   Component Value Date/Time    NA 140 07/25/2022 01:51 AM    K 4.8 07/25/2022 01:51 AM    CL 102 07/25/2022 01:51 AM    CO2 25 07/25/2022 01:51 AM    BUN 15 07/25/2022 01:51 AM    CREATININE 1.0 07/25/2022 01:51 AM    GFRAA >60 05/19/2019 02:16 PM    LABGLOM 86 07/25/2022 01:51 AM    GLUCOSE 164 07/25/2022 01:51 AM    CALCIUM 9.7 07/25/2022 01:51 AM    BILITOT 0.2 07/25/2022 01:51 AM    ALKPHOS 64 07/25/2022 01:51 AM    AST 11 07/25/2022 01:51 AM    ALT 11 07/25/2022 01:51 AM       POC Tests:   Recent Labs     07/26/22  0747   POCGLU 138*       Coags:   Lab Results   Component Value Date/Time    PROTIME 12.4 07/25/2022 01:51 AM    INR 0.9 07/25/2022 01:51 AM    APTT 34.6 07/25/2022 01:51 AM       HCG (If Applicable): No results found for: "PREGTESTUR", "PREGSERUM", "HCG", "HCGQUANT"     ABGs: No results found for: "PHART", "PO2ART", "PCO2ART", "HCO3ART", "BEART", "O2SATART"     Type & Screen (If Applicable):  No results found for: "LABABO"    Drug/Infectious Status (If Applicable):  No  results found for: "HIV", "HEPCAB"    COVID-19 Screening (If Applicable): No results found for: "COVID19"        Anesthesia Evaluation  Patient summary reviewed  Airway: Mallampati: II  TM distance: >3 FB   Neck ROM: full  Mouth opening: > = 3 FB   Dental:    (+) edentulous      Pulmonary:normal exam    (+)           current smoker (thc)                           Cardiovascular:    (+) hypertension:, CAD:                  Neuro/Psych:               GI/Hepatic/Renal:             Endo/Other:    (+) Diabetes.                 Abdominal: normal exam            Vascular:   + PVD, aortic or cerebral (ac held 6/27).       Other Findings:       Anesthesia Plan      general     ASA 3       Induction: intravenous.      Anesthetic plan and risks discussed with patient.    Use of blood products discussed with patient whom consented to blood products.    Plan discussed with CRNA.    Attending anesthesiologist reviewed and agrees with Preprocedure content            Sabra Heck, MD   07/26/2022

## 2022-07-26 NOTE — Progress Notes (Signed)
Pt in surgery.

## 2022-07-26 NOTE — Progress Notes (Signed)
Updated H&P    Procedure:  Anterior C3-4, C4-5, and C5-6 cervical discectomy and fusion    The patient's History and Physical of July 20, 2022 was reviewed with the patient. No significant changes in medical or surgical history are present. The surgical site was confirmed by the patient and me. Original H&P is located in media tab in chart. Patient cleared by cardiology with moderate risk.     Alert and oriented x4, speech clear and coherent  Upper extremities: bilateral upper extremities able to resist gravity, hoffman's on the right, no clonus bilaterally  Lower extremities: bilateral lower extremities 5/5 throughout, no clonus bilaterally, right patellar DTR 3+.     Plan: The risks, benefits, expected outcome, and alternative to the recommended procedure have been discussed with the patient. Patient understands and wants to proceed with the procedure.       Electronically signed by Jonetta Speak, PA-C on 07/26/2022 at 1:00 PM

## 2022-07-26 NOTE — Progress Notes (Signed)
DO NOT start heparin until 48 hours post op.  Thank you.

## 2022-07-26 NOTE — Progress Notes (Signed)
Chaplain Vickie at bedside

## 2022-07-26 NOTE — Progress Notes (Signed)
Neurosurgery Post-op Note    PROCEDURE: C3-4, C4-5, and C5-6 ACDF    07/26/2022 3:16 PM    Patient seen in PACU  Alert and oriented to self, place, time  Able to move all extremities at baseline  Incision intact, dermabond in place    BP (!) 141/67   Pulse 64   Temp (!) 96.5 F (35.8 C) (Temporal)   Resp 18   Ht 1.651 m (5\' 5" )   Wt 70.5 kg (155 lb 7.9 oz)   SpO2 95%   BMI 25.88 kg/m       Plan:  Admit for observation and pain control  Neuro checks every 4 hours  Activity as tolerated  Advance diet as tolerated  Post-operative xrays ordered  Incentive spirometer use every hour while awake  Ok to start DVT prophylaxis 24 hours after procedure  Please call our service if there are any changes in neuro exam    Electronically signed by Jonetta Speak, PA-C on 07/26/2022 at 3:16 PM

## 2022-07-26 NOTE — Progress Notes (Signed)
Cardiology Progress Note     Admit Date:  07/20/2022    Consult reason/ Seen today for :       Subjective and  Overnight Events :  stress test shows lateral wall infarct with no significant  ischemia       Chief complain on admission : 62 y.o.year old who is admitted for  Chief Complaint   Patient presents with    Neck Pain     States he needs emergency surgery for pain that he has had for 20 years, sent by Dr. Luther Redo      Assessment / Plan:  ASCVD:Known occluded Circ he has no active angina   No evidence of heart failure or ACS , proceed with surgery HE is moderate risk   PVD : stable restrt anti platelets post surgery once cleared   HTN: stable, continue To titrate up medication as needed  DVT Prophylaxis if no contraindication  Discussed with primary team, hospitalist service, bedside nursing staff and family  Past medical history:    has a past medical history of CAD (coronary artery disease), Chronic back pain, Diabetes (HCC), H/O cardiac catheterization, H/O cardiovascular stress test, H/O Doppler lower arterial ultrasound, H/O echocardiogram, Hyperlipidemia, Hypertension, IBS (irritable bowel syndrome), Nerve pain, Pancreatitis, PVD (peripheral vascular disease) (HCC), and Scoliosis.  Past surgical history:   has a past surgical history that includes chg US guidance needle placement img s&i.  Social History:   reports that he has been smoking cigarettes. He started smoking about 50 years ago. He has a 50.5 pack-year smoking history. He has never used smokeless tobacco. He reports current drug use. Frequency: 14.00 times per week. Drug: Marijuana Sheran Fava). He reports that he does not drink alcohol.  Family history:  family history includes Heart Attack in his brother; Stroke in his sister.    No Known Allergies    Review of Systems:    All 14 systems were reviewed and are negative  Except for the positive findings  which as documented      BP (!) 171/88   Pulse 67   Temp 97.7 F (36.5 C) (Oral)   Resp 16   Ht 1.651 m (5\' 5" )   Wt 70.5 kg (155 lb 7.9 oz)   SpO2 99%   BMI 25.88 kg/m     Intake/Output Summary (Last 24 hours) at 07/26/2022 1056  Last data filed at 07/26/2022 0142  Gross per 24 hour   Intake 390 ml   Output 900 ml   Net -510 ml     Physical Exam:  Constitutional:  Well developed, Well nourished, No acute distress, Non-toxic appearance.   HENT:  Normocephalic, Atraumatic, Bilateral external ears normal, Oropharynx moist, No oral exudates, Nose normal. Neck-  Supple, No stridor.   Eyes:  PERRL, EOMI, Conjunctiva normal, No discharge.   Respiratory:  Normal breath sounds, No respiratory distress, No wheezing, No chest tenderness.   Cardiovascular:  Normal heart rate, Normal rhythm, No murmurs, No rubs, No gallops, JVP not elevated  Abdomen/GI:  Bowel sounds normal, Soft, No tenderness, No masses, No pulsatile masses.     Musculoskeletal:  No edema, No tenderness, No cyanosis, No clubbing. Good range of motion in all major joints. No tenderness to palpation   Integument:  Warm, Dry, No erythema, No rash.   Lymphatic:  No lymphadenopathy noted.   Neurologic:  Alert & oriented x 3, Normal motor function, Normal sensory function, No focal deficits noted.   Psychiatric:  Affect  and  Mood :no change    Medications:    acetaminophen  650 mg Oral 4 times per day    [Held by provider] aspirin  81 mg Oral Daily    atorvastatin  40 mg Oral Nightly    [Held by provider] carvedilol  12.5 mg Oral BID    [Held by provider] clopidogrel  75 mg Oral Daily    DULoxetine  60 mg Oral Daily    ferrous sulfate  325 mg Oral Daily    gabapentin  400 mg Oral TID    lactobacillus  1 capsule Oral Daily    linaclotide  145 mcg Oral Daily    pantoprazole  40 mg Oral BID AC    tamsulosin  0.4 mg Oral Daily    tiZANidine  4 mg Oral TID    insulin lispro  0-4 Units SubCUTAneous TID WC    insulin lispro  0-4 Units SubCUTAneous Nightly    sodium chloride flush  5-40  mL IntraVENous 2 times per day    [Held by provider] heparin (porcine)  5,000 Units SubCUTAneous 3 times per day    traZODone  150 mg Oral Nightly      dextrose      sodium chloride       oxyCODONE **OR** oxyCODONE, albuterol sulfate HFA, hydrOXYzine pamoate, glucose, dextrose bolus **OR** dextrose bolus, glucagon (rDNA), dextrose, sodium chloride flush, sodium chloride, potassium chloride, magnesium sulfate, ondansetron **OR** ondansetron, melatonin, polyethylene glycol, nicotine    Lab Data:  CBC:   Recent Labs     07/25/22  0151   WBC 15.1*   HGB 12.7*   HCT 38.9*   MCV 87.0   PLT 283     BMP:   Recent Labs     07/25/22  0151   NA 140   K 4.8   CL 102   CO2 25   BUN 15   CREATININE 1.0     PT/INR:   Recent Labs     07/25/22  0151   PROTIME 12.4   INR 0.9     BNP:  No results for input(s): "PROBNP" in the last 72 hours.  TROPONIN: No results for input(s): "TROPONINT" in the last 72 hours.     ECHO : (interpreted by myself)  echocardiogram     Assessment:  62 y.o.year old who is admitted for  Chief Complaint   Patient presents with    Neck Pain     States he needs emergency surgery for pain that he has had for 20 years, sent by Dr. Luther Redo    , active issues as noted below:  Impression:  Principal Problem:    Cervical myelopathy (HCC)  Active Problems:    PAD (peripheral artery disease) (HCC)    ASCVD (arteriosclerotic cardiovascular disease)  Resolved Problems:    * No resolved hospital problems. *            All labs, medications and tests reviewed by myself , continue all other medications of all above medical condition listed as is except for changes mentioned above.    Thank you very much for consult , please call with questions.    Catalina Antigua, MD, MD 07/26/2022 10:56 AM     The above note is prepared with the intention to serve as communication with trained medical care practitioners only. Some of the information is provided by secondary sources as well as from our patient and/or family member(s)  recollection, thus inaccuracies may occur. In addition,  other professionals integrate data into the electronic medical record, and the [facility] MD and NPs are not responsible for these. Any errors will be corrected upon verification with documented reports.

## 2022-07-26 NOTE — Progress Notes (Signed)
07/26/22 4132   Encounter Summary   Encounter Overview/Reason Pre-Procedural   Service Provided For Patient and family together   Referral/Consult From Rounding   Support System Family members   Last Encounter  07/26/22  (PT has surgery, wants prayer, PT wants me to come back right before surgery, will follow-up at that time, continue support.)   Complexity of Encounter Low   Begin Time 0900   End Time  0908   Total Time Calculated 8 min   Spiritual/Emotional needs   Type Spiritual Support   Grief, Loss, and Adjustments   Type Adjustment to illness   Assessment/Intervention/Outcome   Assessment Concerns with suffering   Intervention Active listening   Outcome Coping   Plan and Referrals   Plan/Referrals Continue Support (comment)

## 2022-07-26 NOTE — Progress Notes (Signed)
V2.0    [facility] Progress Note      Name:  Ricardo Foley DOB/Age/Sex: 1960-01-28  (62 y.o. male)   MRN & CSN:  [id number] & [id number] Encounter Date/Time: 07/26/2022 7:28 AM EDT   Location:  OR/NONE PCP: Oval Linsey, APRN - CNP     Attending:Corah Willeford, MD       Hospital Day: 7    Assessment and Recommendations       Plan:   Patient is a 62 year old male who presented with neck pain.      # Cervical myelopathy   - Endorsed neck pain with bilateral arm (L>R) and leg numbness. Ambulates with cane, but unsteady gait. No falls or recent injury/trauma. No saddle anesthesia or B/B incontinence. Recent MRI C/L-spine showed severe central canal stenosis of C3-C4 and C4-C5 with DDD and spondylosis. Sent from NSGY clinic for potential decompression.   - MRI T-spine nonacute. CTA nonacute.   - Plan for surgical intervention on 7/3. Continue holding Plavix. Supportive care.   - Stress test was done that is fixed abnormal cardiologist with the patient to go to the OR     # AKI, mild  - Clinically hypovolemic. Cr 1.4 on admission, baseline ~ 0.8. UA negative.  - Will challenge with IVF. Strict I/O's. Bladder scan if decreased voiding. Avoid NSAIDs.     # CAD  # Non-MI troponin elevation  # PVD  - Denied any typical CP. LHC in 07/2017 showed severe dLCx disease, not amenable to PCI.   - Initial Tn mildly elevated, repeat flat. ECG without acute ischemic changes.  - Likely secondary to decreased renal clearance. Hold ASA and Plavix for surgery, continue Lipitor.      # Essential hypertension  - Hold Coreg in setting of borderline hypotension.     # Hyperkalemia  - Mild, <5.6.  - Follow-up repeat labs.     # Hypomagnesemia  - Mild, repleted.  - Follow-up repeat labs.     # Leukocytosis  - Chronic, no fevers, LA normal.   - Monitor labs.      # Elevated lipase  - Denied any typical abdominal pain.   - CT non-acute.   - Not consistent with pancreatitis.      # T2DM with hyperglycemia  - Last A1c 6.4% on admission.   - Held  Metformin.  - LCSI.     # Tobacco abuse  - Hx of smoking 1.5 PPD for over 40 years.  - Advised on importance of complete cessation.  - Nicotine patches prn.    Diet Diet NPO Exceptions are: Sips of Water with Meds   DVT Prophylaxis []  Lovenox, []   Heparin, []  SCDs, []  Ambulation,  []  Eliquis, []  Xarelto  []  Coumadin   Code Status Full Code   Disposition From: Home  Expected Disposition: TBD  Estimated Date of Discharge: TBD  Patient requires continued admission due to surgical intervention on 7/3   Surrogate Decision Maker/ POA       Personally reviewed Lab Studies and Imaging           Subjective:     Chief Complaint:     The patient was seen at bedside. All questions answered at bedside. NAEON. Tolerating diet. No BM last night. Good UOP.  N.p.o. at midnight for consideration of intervention today patient went for stress test after the patient was going to the OR all questions and concerns addressed    Review of Systems:  Pertinent positives and negatives discussed in HPI    Objective:     Intake/Output Summary (Last 24 hours) at 07/26/2022 1114  Last data filed at 07/26/2022 0142  Gross per 24 hour   Intake 390 ml   Output 900 ml   Net -510 ml        Vitals:   Vitals:    07/26/22 0547 07/26/22 0602 07/26/22 0955 07/26/22 1104   BP:   (!) 171/88 (!) 141/67   Pulse:   67 64   Resp:  17 16 18    Temp:   97.7 F (36.5 C) (!) 96.5 F (35.8 C)   TempSrc:   Oral Temporal   SpO2:   99% 95%   Weight: 70.5 kg (155 lb 7.9 oz)      Height:             Physical Exam:      BMI: There is no height or weight on file to calculate BMI.  General: Awake.     HEENT: PERRLA. Vision grossly intact. Normal hearing. Oropharynx clear.   Neck: Supple. No JVD.   CV: RRR. NL S1/S2. No BLE edema.   Pulm: NL effort on RA. CTAB.   GI: +BS x4. Soft. NT/ND.  GU: No CVA tenderness. No Foley catheter.  Skin: Intact, warm and dry.  MSK: No gross joint deformities. Full ROM.   Neuro: AAOx3. CNs grossly intact. Normal speech. No focal deficit. 5/5  strength. Right hand clonus. Normal sensation.   Psych: Good judgement and reason.        Medications:   Medications:    ceFAZolin (ANCEF) IVPB  2,000 mg IntraVENous On Call to OR    acetaminophen  650 mg Oral 4 times per day    [Held by provider] aspirin  81 mg Oral Daily    atorvastatin  40 mg Oral Nightly    [Held by provider] carvedilol  12.5 mg Oral BID    [Held by provider] clopidogrel  75 mg Oral Daily    DULoxetine  60 mg Oral Daily    ferrous sulfate  325 mg Oral Daily    gabapentin  400 mg Oral TID    lactobacillus  1 capsule Oral Daily    linaclotide  145 mcg Oral Daily    pantoprazole  40 mg Oral BID AC    tamsulosin  0.4 mg Oral Daily    tiZANidine  4 mg Oral TID    insulin lispro  0-4 Units SubCUTAneous TID WC    insulin lispro  0-4 Units SubCUTAneous Nightly    sodium chloride flush  5-40 mL IntraVENous 2 times per day    [Held by provider] heparin (porcine)  5,000 Units SubCUTAneous 3 times per day    traZODone  150 mg Oral Nightly      Infusions:    lactated ringers IV soln      dextrose      sodium chloride       PRN Meds: oxyCODONE, 5 mg, Q4H PRN   Or  oxyCODONE, 10 mg, Q4H PRN  albuterol sulfate HFA, 2 puff, Q6H PRN  hydrOXYzine pamoate, 50 mg, TID PRN  glucose, 4 tablet, PRN  dextrose bolus, 125 mL, PRN   Or  dextrose bolus, 250 mL, PRN  glucagon (rDNA), 1 mg, PRN  dextrose, , Continuous PRN  sodium chloride flush, 5-40 mL, PRN  sodium chloride, , PRN  potassium chloride, 10 mEq, PRN  magnesium sulfate, 2,000 mg, PRN  ondansetron,  4 mg, Q8H PRN   Or  ondansetron, 4 mg, Q6H PRN  melatonin, 3 mg, Nightly PRN  polyethylene glycol, 17 g, Daily PRN  nicotine, 1 patch, Daily PRN        Labs and Imaging   CTA CHEST ABDOMEN PELVIS W CONTRAST    Result Date: 07/20/2022  EXAMINATION: CTA CHEST ABDOMEN PELVIS W CONTRAST 07/20/2022 5:44 PM ACCESSION NUMBER: [id number] COMPARISON: None available INDICATION: abdominal pain, back pain, elevaed d dimer, hypotension TECHNIQUE: Dedicated CT angiographic images  were obtained from the lung apices to the symphysis pubis following administration Iso-vue .  Additional delayed images were obtained through the abdomen and pelvis.  Multiplanar reformats and 3-D reprocessing was utilized for optimal assessment of the images. Radiation dose reduction techniques were used for this study. Our CT scanners use one or all of the following: Automated exposure control, adjustment of the mA and/or kV according to patient size, iterative reconstruction. FINDINGS: AIRWAYS: The central airways are patent. LUNGS: The lungs are clear bilaterally.  No suspicious pulmonary nodules. PLEURA: No pleural effusion or pneumothorax. HEART: The heart is not enlarged. No calcified coronary atherosclerosis.  No pericardial effusion. THORACIC AORTA: The aorta is normal in caliber.  No evidence of aortic dissection, intramural hematoma, or atherosclerotic ulcer. PULMONARY ARTERY: No pulmonary embolus to the segmental level. The main pulmonary artery is normal in caliber. MEDIASTINUM/HILA: No mediastinal mass or lymphadenopathy. CHEST WALL: No mass or axillary lymphadenopathy. LIVER: The liver contour is normal. No suspicious liver lesion. BILIARY TREE: The gallbladder is within normal limits. No biliary dilation. SPLEEN: Normal. PANCREAS: No pancreatic mass or ductal dilation. ADRENALS: Normal. KIDNEYS/BLADDER: The kidneys are symmetric in size. No renal calculus or hydronephrosis. No renal mass. The urinary bladder is unremarkable. BOWEL: The colon is unremarkable. The small bowel is normal in caliber. No bowel wall thickening. PERITONEUM/RETROPERITONEUM: No ascites or free air. No pelvic or retroperitoneal lymphadenopathy. VESSELS: No abdominal aortic aneurysm or dissection. Mild atherosclerotic disease in the aorta. The celiac, SMA, and IMA are patent without significant stenosis. The renal arteries are patent without flow-limiting stenosis. The bilateral iliac arteries are patent without  flow-limiting stenosis. ABDOMINAL WALL: No hernia or mass. REPRODUCTIVE: Unremarkable. BONES: No suspicious osseous lesion.     No aortic dissection or aneurysm. Electronically signed by Alycia Rossetti    MRI THORACIC SPINE WO CONTRAST    Result Date: 07/20/2022  EXAMINATION: MRI THORACIC SPINE WO CONTRAST 07/20/2022 5:38 PM ACCESSION NUMBER: [id number] COMPARISON: None available INDICATION: Evaluate myelopathy TECHNIQUE: Multisequence, multiplanar MR images were obtained of the thoracic spine without the administration of intravenous contrast. FINDINGS: The bone marrow has normal signal characteristics with scattered areas of fatty marrow replacement. Spinal canal is patent. Spinal cord has normal signal. No epidural mass or hematoma. The disc spaces and facet joints are grossly intact throughout the thoracic spine. The surrounding soft tissues are unremarkable.     No acute findings in the thoracic spine. Electronically signed by Alycia Rossetti    XR CHEST PORTABLE    Result Date: 07/20/2022  Chest X-ray INDICATION: Hypotension COMPARISON: November 07, 2018 TECHNIQUE: AP/PA view of the chest was obtained. FINDINGS: The lungs are clear. There are no infiltrates or effusions.  The heart size is normal.  The bony thorax is intact.      No acute findings in the chest Electronically signed by Berdine Addison      CBC:   Recent Labs     07/25/22  0151   WBC 15.1*  HGB 12.7*   PLT 283       BMP:    Recent Labs     07/25/22  0151   NA 140   K 4.8   CL 102   CO2 25   BUN 15   CREATININE 1.0   GLUCOSE 164*       Hepatic:   Recent Labs     07/25/22  0151   AST 11*   ALT 11   BILITOT 0.2   ALKPHOS 64       Lipids:   Lab Results   Component Value Date/Time    CHOL 90 06/11/2018 02:19 AM    HDL 56 07/12/2022 04:07 PM    TRIG 158 06/11/2018 02:19 AM     Hemoglobin A1C:   Lab Results   Component Value Date/Time    LABA1C 6.4 07/20/2022 04:39 PM     TSH:   Lab Results   Component Value Date/Time    TSH 1.42 03/12/2017 03:00 PM     Troponin:   Lab  Results   Component Value Date/Time    TROPONINT <0.010 11/07/2018 10:19 AM    TROPONINT <0.010 03/18/2018 05:14 PM    TROPONINT <0.010 09/08/2017 06:43 PM     Lactic Acid: No results for input(s): "LACTA" in the last 72 hours.  BNP: No results for input(s): "PROBNP" in the last 72 hours.  UA:  Lab Results   Component Value Date/Time    NITRU NEGATIVE 07/24/2022 03:56 PM    COLORU YELLOW 07/24/2022 03:56 PM    PHUR 6.5 07/24/2022 03:56 PM    WBCUA NO CELLS SEEN 05/10/2019 01:46 PM    RBCUA 6 05/10/2019 01:46 PM    MUCUS RARE 06/10/2018 06:50 PM    TRICHOMONAS NONE SEEN 06/10/2018 06:50 PM    BACTERIA NEGATIVE 05/10/2019 01:46 PM    CLARITYU CLEAR 07/24/2022 03:56 PM    LEUKOCYTESUR NEGATIVE 07/24/2022 03:56 PM    UROBILINOGEN 0.2 07/24/2022 03:56 PM    BILIRUBINUR NEGATIVE 07/24/2022 03:56 PM    BLOODU NEGATIVE 07/24/2022 03:56 PM    GLUCOSEU NEGATIVE 07/24/2022 03:56 PM    KETUA NEGATIVE 07/24/2022 03:56 PM     Urine Cultures: No results found for: "LABURIN"  Blood Cultures: No results found for: "BC"  No results found for: "BLOODCULT2"  Organism: No results found for: "ORG"      Electronically signed by Sandy Salaam, MD on 07/26/2022 at 11:14 AM

## 2022-07-26 NOTE — Progress Notes (Signed)
1603 Pt arrived from OR to PACU. Monitors applied and alarms in place. Report rec'd from [employer] and Investment banker, corporate. Pt able to move all 4 extremities  1612 Pt medicated for pain  1617 Pt medicated for pain  1624 Pt medicated for pain  1626 BG 128  1630 Pt medicated for pain  1634 pt medicated for pain  1641 pt medicated for pain  1645 Pt tolerating ice chips appropriately   1647 Pt medicated for HTN  1700 Report called to Katie RN  1720 Transport at bedside

## 2022-07-27 LAB — CBC
Hematocrit: 39.8 % — ABNORMAL LOW (ref 42–52)
Hemoglobin: 12.4 GM/DL — ABNORMAL LOW (ref 13.5–18.0)
MCH: 28.2 PG (ref 27–31)
MCHC: 31.2 % — ABNORMAL LOW (ref 32.0–36.0)
MCV: 90.5 FL (ref 78–100)
MPV: 9.6 FL (ref 7.5–11.1)
Platelets: 295 10*3/uL (ref 140–440)
RBC: 4.4 10*6/uL — ABNORMAL LOW (ref 4.6–6.2)
RDW: 17.6 % — ABNORMAL HIGH (ref 11.7–14.9)
WBC: 19 10*3/uL — ABNORMAL HIGH (ref 4.0–10.5)

## 2022-07-27 LAB — BASIC METABOLIC PANEL
Anion Gap: 15 (ref 7–16)
BUN: 19 MG/DL (ref 6–23)
CO2: 22 MMOL/L (ref 21–32)
Calcium: 10.1 MG/DL (ref 8.3–10.6)
Chloride: 96 mMol/L — ABNORMAL LOW (ref 99–110)
Creatinine: 1 MG/DL (ref 0.9–1.3)
Est, Glom Filt Rate: 86 mL/min/{1.73_m2} (ref >60)
Glucose: 228 MG/DL — ABNORMAL HIGH (ref 70–99)
Potassium: 4.5 MMOL/L (ref 3.5–5.1)
Sodium: 133 MMOL/L — ABNORMAL LOW (ref 135–145)

## 2022-07-27 LAB — POCT GLUCOSE
POC Glucose: 299 MG/DL — ABNORMAL HIGH (ref 70–99)
POC Glucose: 194 MG/DL — ABNORMAL HIGH (ref 70–99)
POC Glucose: 221 MG/DL — ABNORMAL HIGH (ref 70–99)

## 2022-07-27 MED ORDER — CALCIUM CARBONATE ANTACID 500 MG PO CHEW
500 | Freq: Three times a day (TID) | ORAL | Status: DC | PRN
Start: 2022-07-27 — End: 2022-07-27
  Administered 2022-07-27: 03:00:00 500 mg via ORAL

## 2022-07-27 MED ORDER — OXYCODONE-ACETAMINOPHEN 7.5-325 MG PO TABS
ORAL_TABLET | Freq: Four times a day (QID) | ORAL | 0 refills | Status: AC | PRN
Start: 2022-07-27 — End: 2022-07-30

## 2022-07-27 MED ORDER — HYDRALAZINE HCL 20 MG/ML IJ SOLN
20 | Freq: Four times a day (QID) | INTRAMUSCULAR | Status: DC | PRN
Start: 2022-07-27 — End: 2022-07-27

## 2022-07-27 MED FILL — GABAPENTIN 400 MG PO CAPS: 400 MG | ORAL | Qty: 1

## 2022-07-27 MED FILL — CULTURELLE PO CAPS: ORAL | Qty: 1

## 2022-07-27 MED FILL — LIPITOR 40 MG PO TABS: 40 MG | ORAL | Qty: 1

## 2022-07-27 MED FILL — TIZANIDINE HCL 4 MG PO TABS: 4 MG | ORAL | Qty: 1

## 2022-07-27 MED FILL — FERROUS SULFATE 325 (65 FE) MG PO TABS: 325 (65 Fe) MG | ORAL | Qty: 1

## 2022-07-27 MED FILL — OXYCODONE HCL 10 MG PO TABS: 10 MG | ORAL | Qty: 1

## 2022-07-27 MED FILL — MELATONIN 3 MG PO TABS: 3 MG | ORAL | Qty: 1

## 2022-07-27 MED FILL — ACETAMINOPHEN 325 MG PO TABS: 325 MG | ORAL | Qty: 2

## 2022-07-27 MED FILL — CEFAZOLIN SODIUM 2 G IJ SOLR: 2 g | INTRAMUSCULAR | Qty: 2000

## 2022-07-27 MED FILL — PANTOPRAZOLE SODIUM 40 MG PO TBEC: 40 MG | ORAL | Qty: 1

## 2022-07-27 MED FILL — DILAUDID 1 MG/ML IJ SOLN: 1 MG/ML | INTRAMUSCULAR | Qty: 0.5

## 2022-07-27 MED FILL — CYMBALTA 30 MG PO CPEP: 30 MG | ORAL | Qty: 2

## 2022-07-27 MED FILL — ANTACID CALCIUM 500 MG PO CHEW: 500 MG | ORAL | Qty: 1

## 2022-07-27 MED FILL — CARVEDILOL 6.25 MG PO TABS: 6.25 MG | ORAL | Qty: 2

## 2022-07-27 MED FILL — TAMSULOSIN HCL 0.4 MG PO CAPS: 0.4 MG | ORAL | Qty: 1

## 2022-07-27 MED FILL — TRAZODONE HCL 50 MG PO TABS: 50 MG | ORAL | Qty: 3

## 2022-07-27 NOTE — Plan of Care (Signed)
Patient ready to go home

## 2022-07-27 NOTE — Discharge Instructions (Signed)
As tolerated

## 2022-07-27 NOTE — Discharge Summary (Signed)
V2.0  Discharge Summary    Name:  Ricardo Foley DOB/Age/Sex: 1960/01/28 (62 y.o. male)   Admit Date: 07/20/2022  Discharge Date: 07/27/22    MRN & CSN:  [id number] & [id number] Encounter Date and Time 07/27/22 11:49 AM EDT    Attending:  Sandy Salaam, MD Discharging Provider: Sandy Salaam, MD       Hospital Course:     Brief HPI: Patient is a 62 year old male who presented with neck pain.      # Cervical myelopathy   - Endorsed neck pain with bilateral arm (L>R) and leg numbness. Ambulates with cane, but unsteady gait. No falls or recent injury/trauma. No saddle anesthesia or B/B incontinence. Recent MRI C/L-spine showed severe central canal stenosis of C3-C4 and C4-C5 with DDD and spondylosis. Sent from NSGY clinic for potential decompression.   - MRI T-spine nonacute. CTA nonacute.   - Plan for surgical intervention on 7/3. Continue holding Plavix. Supportive care.   - Stress test was done that is fixed abnormal cardiologist cleared the patient for surgery  Status post cervical fusion on 07/26/2022 tolerated the procedure well discharged today with neurosurgery follow-up along with pain medications for 3 days     # AKI, mild improved  - Clinically hypovolemic. Cr 1.4 on admission, baseline ~ 0.8. UA negative.  - Will challenge with IVF. Strict I/O's. Bladder scan if decreased voiding. Avoid NSAIDs.     # CAD  # Non-MI troponin elevation  # PVD  - Denied any typical CP. LHC in 07/2017 showed severe dLCx disease, not amenable to PCI.   - Initial Tn mildly elevated, repeat flat. ECG without acute ischemic changes.  - Likely secondary to decreased renal clearance. Held ASA and Plavix for surgery, continue Lipitor.  Resume all on discharge     # Essential hypertension  -Resume Coreg in setting of borderline hypotension.     # Hyperkalemia  - Mild, <5.6.  - Follow-up repeat labs.     # Hypomagnesemia  - Mild, repleted.  - Follow-up repeat labs.     # Leukocytosis  - Chronic, no fevers, LA normal.   - Monitor labs.      #  Elevated lipase  - Denied any typical abdominal pain.   - CT non-acute.   - Not consistent with pancreatitis.      # T2DM with hyperglycemia  - Last A1c 6.4% on admission.   - Held Metformin.  - LCSI.     # Tobacco abuse  - Hx of smoking 1.5 PPD for over 40 years.  - Advised on importance of complete cessation.  - Nicotine patches prn.     Medical Decision Making:  The following items were considered in medical decision making:  Discussion of patient care with other providers  Reviewed clinical lab tests if any  Reviewed radiology tests if any  Reviewed other diagnostic tests/interventions  Independent review of radiologic images if any  Microbiology cultures and other micro tests if any    The patient expressed appropriate understanding of, and agreement with the discharge recommendations, medications, and plan.     Consults this admission:  IP CONSULT TO ORTHOPEDIC SURGERY  IP CONSULT TO CARDIOLOGY  IP CONSULT TO SPIRITUAL SERVICES  IP CONSULT TO SPIRITUAL SERVICES    Discharge Diagnosis:   Cervical myelopathy [facility])        Discharge Instruction:   Follow up appointments: PCP, neurosurgery, cardiology  Primary care physician: Oval Linsey, APRN - CNP within 2 weeks  Diet: cardiac diet   Activity: activity as tolerated  Disposition: Discharged to:   [x] Home, [] HHC, [] SNF, [] Acute Rehab, [] Hospice   Condition on discharge: Stable  Labs and Tests to be Followed up as an outpatient by PCP or Specialist:     Discharge Medications:        Medication List        CONTINUE taking these medications      albuterol sulfate HFA 108 (90 Base) MCG/ACT inhaler  Commonly known as: PROVENTIL;VENTOLIN;PROAIR     aspirin 81 MG chewable tablet  Take 1 tablet by mouth daily     atorvastatin 40 MG tablet  Commonly known as: LIPITOR     carvedilol 12.5 MG tablet  Commonly known as: COREG     clopidogrel 75 MG tablet  Commonly known as: PLAVIX     dicyclomine 10 MG capsule  Commonly known as: BENTYL  Take 1 capsule by mouth every 6 hours  as needed (Bowel spasms)     DULoxetine 60 MG extended release capsule  Commonly known as: CYMBALTA     gabapentin 400 MG capsule  Commonly known as: NEURONTIN     hydrOXYzine pamoate 50 MG capsule  Commonly known as: VISTARIL     metFORMIN 1000 MG tablet  Commonly known as: GLUCOPHAGE     ondansetron 4 MG tablet  Commonly known as: ZOFRAN  Take 1 tablet by mouth daily as needed for Nausea or Vomiting     oxyCODONE-acetaminophen 7.5-325 MG per tablet  Commonly known as: PERCOCET  Take 1 tablet by mouth every 6 hours as needed for Pain for up to 3 days. Max Daily Amount: 4 tablets     pantoprazole 40 MG tablet  Commonly known as: PROTONIX     tamsulosin 0.4 MG capsule  Commonly known as: FLOMAX     traZODone 150 MG tablet  Commonly known as: DESYREL     Zanaflex 4 MG tablet  Generic drug: tiZANidine            STOP taking these medications      DIALYVITE TABLET Tabs     FeroSul 325 (65 Fe) MG tablet  Generic drug: ferrous sulfate     lactobacillus Caps capsule     Linzess 145 MCG capsule  Generic drug: linaclotide     lisinopril 20 MG tablet  Commonly known as: PRINIVIL;ZESTRIL               Where to Get Your Medications        These medications were sent to [id number], [place] - [address] - [phone] Carmon Ginsberg [phone]  [address], [place] [address]      Phone: [phone]   oxyCODONE-acetaminophen 7.5-325 MG per tablet        Objective Findings at Discharge:   BP (!) 178/87   Pulse 74   Temp 97.6 F (36.4 C) (Temporal)   Resp 19   Ht 1.651 m (5\' 5" )   Wt 71 kg (156 lb 8.4 oz)   SpO2 96%   BMI 26.05 kg/m       Physical Exam:   Physical Exam  Vitals reviewed.   Constitutional:       Appearance: Normal appearance. He is normal weight.   HENT:      Head: Normocephalic.      Nose: Nose normal.      Mouth/Throat:      Mouth: Mucous membranes are moist.  Eyes:      Conjunctiva/sclera: Conjunctivae normal.      Pupils: Pupils are equal, round, and reactive  to light.   Cardiovascular:      Rate and Rhythm: Normal rate and regular rhythm.      Pulses: Normal pulses.      Heart sounds: Normal heart sounds. No murmur heard.  Pulmonary:      Effort: Pulmonary effort is normal.      Breath sounds: Normal breath sounds. No wheezing, rhonchi or rales.   Abdominal:      General: Abdomen is flat. Bowel sounds are normal. There is no distension.      Palpations: Abdomen is soft.      Tenderness: There is no abdominal tenderness.   Musculoskeletal:         General: No deformity. Normal range of motion.      Cervical back: Normal range of motion and neck supple.      Right lower leg: No edema.      Left lower leg: No edema.   Skin:     Coloration: Skin is not jaundiced or pale.   Neurological:      General: No focal deficit present.      Mental Status: He is alert and oriented to person, place, and time. Mental status is at baseline.              Labs and Imaging   XR CERVICAL SPINE (2-3 VIEWS)    Result Date: 07/27/2022  EXAM: XR CERVICAL SPINE (2-3 VIEWS) INDICATION: post op COMPARISON: None. TECHNIQUE: Cervical Spine AP/LAT images were obtained. FINDINGS: Alignment is within normal limits. Vertebral body heights are preserved. No radiographic evidence of fracture. Intervertebral disc spaces are maintained. Bone mineralization and soft tissues are within normal limits. Anterior cervical fusion and spacers from the C3-C6 levels     No acute radiographic abnormality. No evidence of hardware failure status post cervical fusion. Electronically signed by Narda Amber    FL LESS THAN 1 HOUR    Result Date: 07/26/2022  Radiology exam is complete. No Radiologist dictation. Please follow up with ordering provider.     Nuclear stress test with myocardial perfusion    Result Date: 07/26/2022    : LV perfusion is abnormal.   Perfusion Defect: There is a left ventricular stress perfusion defect that is medium to large in size that is predominantly fixed.Mild reversibitlity   Resting ECG  demonstrates normal sinus rhythm.   The stress ECG was negative for ischemia.   A pharmacological stress test was performed using regadenoson Eugenie Birks).   Abnormal stress test Proceed with surgery        CBC:   Recent Labs     07/25/22  0151 07/27/22  1125   WBC 15.1* 19.0*   HGB 12.7* 12.4*   PLT 283 295     BMP:    Recent Labs     07/25/22  0151   NA 140   K 4.8   CL 102   CO2 25   BUN 15   CREATININE 1.0   GLUCOSE 164*     Hepatic:   Recent Labs     07/25/22  0151   AST 11*   ALT 11   BILITOT 0.2   ALKPHOS 64     Lipids:   Lab Results   Component Value Date/Time    CHOL 90 06/11/2018 02:19 AM    HDL 56 07/12/2022 04:07 PM    TRIG  158 06/11/2018 02:19 AM     Hemoglobin A1C:   Lab Results   Component Value Date/Time    LABA1C 6.4 07/20/2022 04:39 PM     TSH:   Lab Results   Component Value Date/Time    TSH 1.42 03/12/2017 03:00 PM     Troponin:   Lab Results   Component Value Date/Time    TROPONINT <0.010 11/07/2018 10:19 AM    TROPONINT <0.010 03/18/2018 05:14 PM    TROPONINT <0.010 09/08/2017 06:43 PM     Lactic Acid: No results for input(s): "LACTA" in the last 72 hours.  BNP: No results for input(s): "PROBNP" in the last 72 hours.  UA:  Lab Results   Component Value Date/Time    NITRU NEGATIVE 07/24/2022 03:56 PM    COLORU YELLOW 07/24/2022 03:56 PM    PHUR 6.5 07/24/2022 03:56 PM    WBCUA NO CELLS SEEN 05/10/2019 01:46 PM    RBCUA 6 05/10/2019 01:46 PM    MUCUS RARE 06/10/2018 06:50 PM    TRICHOMONAS NONE SEEN 06/10/2018 06:50 PM    BACTERIA NEGATIVE 05/10/2019 01:46 PM    CLARITYU CLEAR 07/24/2022 03:56 PM    LEUKOCYTESUR NEGATIVE 07/24/2022 03:56 PM    UROBILINOGEN 0.2 07/24/2022 03:56 PM    BILIRUBINUR NEGATIVE 07/24/2022 03:56 PM    BLOODU NEGATIVE 07/24/2022 03:56 PM    GLUCOSEU NEGATIVE 07/24/2022 03:56 PM    KETUA NEGATIVE 07/24/2022 03:56 PM     Urine Cultures: No results found for: "LABURIN"  Blood Cultures: No results found for: "BC"  No results found for: "BLOODCULT2"  Organism: No results found  for: "ORG"    Time Spent Discharging patient :    Estimated time spent for medical decision-making encompassing complexity of the case, history taking, medication review, physical examination, communication with family, RN, case Production designer, theatre/television/film, discussion with specialists, and ancillary staff members to provide accurate care for the patient was around 35 minutes.    The billing in this case is level [id number] due to the combination of above and specifically because of complexity.    Electronically signed by Sandy Salaam, MD on 07/27/2022 at 11:49 AM

## 2022-07-27 NOTE — Op Note (Signed)
Operative Note      Patient: Ricardo Foley  Date of Birth: 10/02/1960  MRN: [id number]    Date of Procedure: 08/02/22    Pre-Op Diagnosis Codes:     * Myelopathy concurrent with and due to spinal stenosis of cervical region (HCC) [M48.02, G99.2]    Post-Op Diagnosis: Same       Procedure(s):  C3-C6 CERVICAL DISCECTOMY FUSION ANTERIOR THREE+ LEVELS    Surgeon(s):  Sandria Manly, MD  Luis Abed, MD    Assistant:   * No surgical staff found *    Anesthesia: General    Estimated Blood Loss (mL): Minimal    Complications: None    Specimens:   * No specimens in log *    Implants:  Implant Name Type Inv. Item Serial No. Manufacturer Lot No. LRB No. Used Action   PLATE SPNL 3 LEVEL 42 MM CERV ADMIRAL [id number] - [id number]  PLATE SPNL 3 LEVEL 42 MM CERV ADMIRAL [id number]  [employer]  N/A 1 Implanted   SCREW SPNL SD 4X18 MM VA ADMIRAL [id number] - [id number]  SCREW SPNL SD 4X18 MM VA ADMIRAL [id number]  [employer]  N/A 1 Implanted   SCREW SPNL SD 4X16 MM VA ADMIRAL [id number] - [id number]  SCREW SPNL SD 4X16 MM VA ADMIRAL [id number]  [employer]  N/A 7 Implanted   CAGE SPNL 10 DEG 16X14X7 MM 3D - [id number]  CAGE SPNL 10 DEG [id number] MM 3D  [employer] [id number] N/A 1 Implanted   CAGE SPNL 10 DEG 16X14X6 MM 3D - [id number]  CAGE SPNL 10 DEG 16X14X6 MM 3D  [employer] [id number] N/A 1 Implanted   CAGE SPNL 10 DEG 16X14X6 MM 3D - [id number]  CAGE SPNL 10 DEG [id number] MM 3D  [employer] [id number] N/A 1 Implanted         Drains: * No LDAs found *    Findings:  Infection Present At Time Of Surgery (PATOS) (choose all levels that have infection present):  No infection present  Other Findings: None    Detailed Description of Procedure:   Patient was seen in the preoperative area marked and consented.  He was taken back to the operating room placed on a regular bed and placed under general anesthesia.  Arms were wrapped by the side ulnar areas were padded.  Shoulder roll was placed  underneath the shoulder.  Neuromonitoring placed her leads.  Patient was given antibiotics 30 minutes prior to incision.  We performed timeout everybody was in agreement.  Left-sided neck was prepped and draped in a sterile fashion.  We brought in fluoroscopy in the lateral plane to localize her levels made incision with a 15 blade and dissected down to the prevertebral fascia and localize her levels via fluoroscopy in the lateral plane.  I elevated the longus coli bilaterally from C3-C6.  Placed retractors and then placed a Caspar pin from C3-C4 performed a discectomy with pituitary rongeurs used Kerrisons to release the posterior longitudinal ligament and perform bilateral foraminotomies.  We placed trial guides and then placed a cage packed with allograft.  I then remove the Caspar pin at C3 placed at C4 and C5 and perform the above-stated discectomy pituitary rongeurs and curettes and released posterior longitudinal ligament with Kerrison rongeurs.  We performed bilateral foraminotomies.  Placed trial guides and then placed a cage packed with allograft.  I then remove the Caspar pin at C5 and placed at C6.  We then performed  discectomy with pituitary rongeurs and curettes and used a matchstick bur to fashion the endplates at C5-6.  I released posterior longitudinal ligament Kerrison rongeurs to perform bilateral foraminotomies Kerrison rongeurs placed trial guides and then placed a cage packed with allograft.  I then placed the plate separate from the cage over the disc spaces of C3-C6.    I then placed screws into the vertebral bodies of C3-C4-C5 and C6.  Locked the screws into the plate we had run motors after every cage placement and final plate placement all motors were noted to be at baseline.  We copiously irrigated the wound with normal saline and closed the platysma level 2-0 running Vicryl suture and the skin with 3-0 subcuticular Monocryl stitch.  Placed Dermabond sterile dressings.  Patient was awoken  from general anesthesia and taken to the PACU for recovery.  All counts correct x 2.        Electronically signed by Sandria Manly, MD on 07/29/2022 at 8:42 AM

## 2022-07-27 NOTE — Discharge Instructions (Signed)
Ricardo Molina IV, MD  Orthopaedic Spine Surgeon  [facility]  Office: [phone]  Patient Two-Way Txt: [phone]  Email: [email or web address]  Post-Operative Discharge Instructions  Activity  ? Rest at home for the first 24 hours after surgery. It is recommended that someone stay with  you during this time.  ? Inhale and exhale deeply and cough frequently for the first 24 hours after surgery to fill your  lungs and prevent breathing complications.  ? If you have been given a brace use as directed by Dr. Molina  ? Walking is encouraged daily.  ? Wear your compressive stockings until you are ambulating well.  ? No bending, twisting, or lifting objects greater than 5 pounds until seen in the office for your  follow up appointment.  ? No driving until cleared by Dr. Molina. Each patient is different. Do not drink alcohol,  operate heavy machinery, drive, or sign legally binding documents while taking prescription  pain medications or muscle relaxers.  ? Smoking and other tobacco use is strongly discouraged as it prevents healing and may  jeopardize your surgery.  ? You may sleep however is most comfortable to you. Typically, back or side sleeping is better  tolerated than stomach sleeping. Pillows placed under your neck and back and/or between  your legs while sleeping may provide additional comfort  ? No sexual activity for 2 weeks.  ? Physical therapy, if needed, will be arranged at your first follow up appointment.  ? Resume your regular diet as tolerated.  ? If you had neck surgery, sore throat and swallowing difficulty are normal post-operatively.  o Moisten foods to help with swallowing.  o Gentle massage of the neck and throat will help break up scar tissue.  Pain Control  ? Take only pain medications prescribed by the office.  ? Do not take non-steroidal anti-inflammatory medications (e.g. ibuprofen), aspirin, or blood  thinning products until approved by  Dr. Molina.  ? Monitor your need for medication refills closely. Contact the office or use the patient twoway  txt number during normal business hours at least 2 days before you anticipate a refill  will be required.  ? A recurrence of your pre-operative pain is not abnormal.  ? Nerve healing is often accompanied by a temporary increase in pain.  ? Call the office or use the two-way txt line is pain persists.  ? An oral steroid medication may be prescribed to help reduce inflammation and pain.  Incision Care  ? Keep the dressing clean and dry.  ? The dressing should be changed at day 7 and then a new dressing placed. The second  dressing will be removed during your post op visit.  ? You may shower after surgery as long as the dressing can be kept dry.  o Use shower shields.  ? Do not apply ointments, creams, or lotions to your incision.  ? No submerging your incision in water (e.g., baths, hot tubs, swimming pools) for the first 6  weeks after surgery.  When to Call Dr. Molina?  ? Fever of 101.3 degrees or greater.  ? Clear, white, or excessive drainage from the incision.  ? Spreading skin redness about the incision.  ? Increase in pain which is not controlled with pain medications.  ? Difficulty urinating or trouble having bowel movements despite stool softeners.  ? Loss of bowel or bladder control.  ? New numbness about your genitals.  ? New weakness in your   arms or legs.  ? New calf pain or swelling.  ? Headache that worsens when standing and resolves when lying flat.  ? Chest pain or difficulty breathing.  ? Sudden, severe headache.  ? Call [phone] or text [phone] to discuss any issues. If you need immediate  attention, call 911.  Follow Up Appointment  ? A follow up appointment with Dr. Molina has been scheduled for approximately 2 weeks  after surgery.  ? If you are unsure of the date, time, or location please call or text us in advanced to confirm.

## 2022-07-27 NOTE — Progress Notes (Signed)
Cardiology Progress Note     Admit Date:  07/20/2022    Consult reason/ Seen today for :       Subjective and  Overnight Events :  He under went surgery did ok  BP is high   stress test shows lateral wall infarct with no significant  ischemia       Chief complain on admission : 62 y.o.year old who is admitted for  Chief Complaint   Patient presents with    Neck Pain     States he needs emergency surgery for pain that he has had for 20 years, sent by Dr. Luther Redo      Assessment / Plan:  ASCVD:Known occluded Circ he has no active angina   Restart coreg ( on hold )  PVD : stable restrt anti platelets post surgery once cleared   HTN: stable, continue To titrate up medication as needed  DVT Prophylaxis if no contraindication  Discussed with primary team, hospitalist service, bedside nursing staff and family  Past medical history:    has a past medical history of CAD (coronary artery disease), Chronic back pain, Diabetes (HCC), H/O cardiac catheterization, H/O cardiovascular stress test, H/O Doppler lower arterial ultrasound, H/O echocardiogram, Hyperlipidemia, Hypertension, IBS (irritable bowel syndrome), Nerve pain, Pancreatitis, PVD (peripheral vascular disease) (HCC), and Scoliosis.  Past surgical history:   has a past surgical history that includes chg US guidance needle placement img s&i.  Social History:   reports that he has been smoking cigarettes. He started smoking about 50 years ago. He has a 50.5 pack-year smoking history. He has never used smokeless tobacco. He reports current drug use. Frequency: 14.00 times per week. Drug: Marijuana Sheran Fava). He reports that he does not drink alcohol.  Family history:  family history includes Heart Attack in his brother; Stroke in his sister.    No Known Allergies    Review of Systems:    All 14 systems were reviewed and are negative  Except for the positive findings  which as documented     BP (!)  178/87   Pulse 74   Temp 97.6 F (36.4 C) (Temporal)   Resp 16   Ht 1.651 m (5\' 5" )   Wt 71 kg (156 lb 8.4 oz)   SpO2 96%   BMI 26.05 kg/m     Intake/Output Summary (Last 24 hours) at 07/27/2022 1008  Last data filed at 07/27/2022 0740  Gross per 24 hour   Intake 1830.17 ml   Output 2400 ml   Net -569.83 ml     Physical Exam:  Constitutional:  Well developed, Well nourished, No acute distress, Non-toxic appearance.   HENT:  Normocephalic, Atraumatic, Bilateral external ears normal, Oropharynx moist, No oral exudates, Nose normal. Neck-  Supple, No stridor.   Eyes:  PERRL, EOMI, Conjunctiva normal, No discharge.   Respiratory:  Normal breath sounds, No respiratory distress, No wheezing, No chest tenderness.   Cardiovascular:  Normal heart rate, Normal rhythm, No murmurs, No rubs, No gallops, JVP not elevated  Abdomen/GI:  Bowel sounds normal, Soft, No tenderness, No masses, No pulsatile masses.     Musculoskeletal:  No edema, No tenderness, No cyanosis, No clubbing. Good range of motion in all major joints. No tenderness to palpation   Integument:  Warm, Dry, No erythema, No rash.   Lymphatic:  No lymphadenopathy noted.   Neurologic:  Alert & oriented x 3, Normal motor function, Normal sensory function, No focal deficits noted.   Psychiatric:  Affect  and  Mood :no change    Medications:    sodium chloride flush  5-40 mL IntraVENous 2 times per day    acetaminophen  650 mg Oral Q6H    [Held by provider] aspirin  81 mg Oral Daily    atorvastatin  40 mg Oral Nightly    carvedilol  12.5 mg Oral BID    [Held by provider] clopidogrel  75 mg Oral Daily    DULoxetine  60 mg Oral Daily    ferrous sulfate  325 mg Oral Daily    gabapentin  400 mg Oral TID    lactobacillus  1 capsule Oral Daily    linaclotide  145 mcg Oral Daily    pantoprazole  40 mg Oral BID AC    tamsulosin  0.4 mg Oral Daily    tiZANidine  4 mg Oral TID    insulin lispro  0-4 Units SubCUTAneous TID WC    insulin lispro  0-4 Units SubCUTAneous Nightly     sodium chloride flush  5-40 mL IntraVENous 2 times per day    [Held by provider] heparin (porcine)  5,000 Units SubCUTAneous 3 times per day    traZODone  150 mg Oral Nightly      sodium chloride      dextrose      sodium chloride       hydrALAZINE, sodium chloride flush, sodium chloride, ondansetron **OR** ondansetron, calcium carbonate, oxyCODONE **OR** oxyCODONE, albuterol sulfate HFA, hydrOXYzine pamoate, glucose, dextrose bolus **OR** dextrose bolus, glucagon (rDNA), dextrose, sodium chloride flush, sodium chloride, potassium chloride, magnesium sulfate, melatonin, polyethylene glycol, nicotine    Lab Data:  CBC:   Recent Labs     07/25/22  0151   WBC 15.1*   HGB 12.7*   HCT 38.9*   MCV 87.0   PLT 283     BMP:   Recent Labs     07/25/22  0151   NA 140   K 4.8   CL 102   CO2 25   BUN 15   CREATININE 1.0     PT/INR:   Recent Labs     07/25/22  0151   PROTIME 12.4   INR 0.9     BNP:  No results for input(s): "PROBNP" in the last 72 hours.  TROPONIN: No results for input(s): "TROPONINT" in the last 72 hours.     ECHO : (interpreted by myself)  echocardiogram     Assessment:  62 y.o.year old who is admitted for  Chief Complaint   Patient presents with    Neck Pain     States he needs emergency surgery for pain that he has had for 20 years, sent by Dr. Luther Redo    , active issues as noted below:  Impression:  Principal Problem:    Cervical myelopathy (HCC)  Active Problems:    PAD (peripheral artery disease) (HCC)    ASCVD (arteriosclerotic cardiovascular disease)  Resolved Problems:    * No resolved hospital problems. *            All labs, medications and tests reviewed by myself , continue all other medications of all above medical condition listed as is except for changes mentioned above.    Thank you very much for consult , please call with questions.    Catalina Antigua, MD, MD 07/27/2022 10:08 AM     The above note is prepared with the intention to serve as communication with trained medical care practitioners  only.  Some of the information is provided by secondary sources as well as from our patient and/or family member(s) recollection, thus inaccuracies may occur. In addition, other professionals integrate data into the electronic medical record, and the Heart Team MD and NPs are not responsible for these. Any errors will be corrected upon verification with documented reports.

## 2022-07-27 NOTE — Progress Notes (Signed)
Neurosurgery Progress Note  07/27/2022 9:25 AM      POD:     Assessment and Plan:   S/p C3-C6 ACDF  Patient can go home from my standpoint.  I will see him back in two weeks for repeat evaluation.  Patient is doing well.          Subjective:   Admit Date: 07/20/2022  PCP: Oval Linsey, APRN - CNP    Patient states he is doing well.  He denies any issues.    Data:   Scheduled Meds:   sodium chloride flush  5-40 mL IntraVENous 2 times per day    acetaminophen  650 mg Oral Q6H    [Held by provider] aspirin  81 mg Oral Daily    atorvastatin  40 mg Oral Nightly    [Held by provider] carvedilol  12.5 mg Oral BID    [Held by provider] clopidogrel  75 mg Oral Daily    DULoxetine  60 mg Oral Daily    ferrous sulfate  325 mg Oral Daily    gabapentin  400 mg Oral TID    lactobacillus  1 capsule Oral Daily    linaclotide  145 mcg Oral Daily    pantoprazole  40 mg Oral BID AC    tamsulosin  0.4 mg Oral Daily    tiZANidine  4 mg Oral TID    insulin lispro  0-4 Units SubCUTAneous TID WC    insulin lispro  0-4 Units SubCUTAneous Nightly    sodium chloride flush  5-40 mL IntraVENous 2 times per day    [Held by provider] heparin (porcine)  5,000 Units SubCUTAneous 3 times per day    traZODone  150 mg Oral Nightly         I/O last 3 completed shifts:  In: 1980.2 [P.O.:150; I.V.:1830.2]  Out: 2400 [Urine:2400]  I/O this shift:  In: -   Out: 900 [Urine:900]    Intake/Output Summary (Last 24 hours) at 07/27/2022 0925  Last data filed at 07/27/2022 0740  Gross per 24 hour   Intake 1830.17 ml   Output 2400 ml   Net -569.83 ml     CULTURE results: Invalid input(s): "BLOOD CULTURE", "URINE CULTURE", "SURGICAL CULTURE"  CBC:   Recent Labs     07/25/22  0151   WBC 15.1*   HGB 12.7*   PLT 283     BMP:    Recent Labs     07/25/22  0151   NA 140   K 4.8   CL 102   CO2 25   BUN 15   CREATININE 1.0   GLUCOSE 164*     Hepatic:   Recent Labs     07/25/22  0151   AST 11*   ALT 11   BILITOT 0.2   ALKPHOS 64         IMAGING: available xray, CT, and MRI  results reviewed    Objective:   Vitals: BP (!) 178/87   Pulse 74   Temp 97.6 F (36.4 C) (Temporal)   Resp 16   Ht 1.651 m (5\' 5" )   Wt 71 kg (156 lb 8.4 oz)   SpO2 96%   BMI 26.05 kg/m   General appearance:   HEENT: Head: Normocephalic, no lesions, without obvious abnormality.  DERM: normal  Abdomen: soft, non-tender; bowel sounds normal; no masses,  no organomegaly  Neurologic: Mental status: Alert, oriented, thought content appropriate   Extremities: extremities normal, atraumatic, no cyanosis or edema;  spine: 5/5 strength C5-T1, SILT, 2+ biceps reflex, 2+ radial pulse  Incision: c/d/i        Electronically signed by Sandria Manly, MD on 07/27/2022 at 9:25 AM

## 2022-07-27 NOTE — Consults (Signed)
Introduced myself to patient with AIDET.  Discussed case.  Patient states he is leaving hospital imminently, awaiting physician discharge.  States he is moving independently, safe for home.  Wife agrees.  Stood and walked about room.  Patient asked for pain meds, willing to redirect to nurse.  D/w nurse after.  Removed PT consult per patient request.    Oneal Deputy, PT [id number]  11:03 AM 07/27/2022

## 2022-07-27 NOTE — Discharge Instructions (Addendum)
Good nutrition is important when healing from an illness, injury, or surgery.  Follow any nutrition recommendations given to you during your hospital stay.   If you were given an oral nutrition supplement while in the hospital, continue to take this supplement at home.  You can take it with meals, in-between meals, and/or before bedtime. These supplements can be purchased at most local grocery stores, pharmacies, and chain super-stores.   If you have any questions about your diet or nutrition, call the hospital and ask for the dietitian.

## 2022-10-03 NOTE — Progress Notes (Signed)
 Patient no-showed his 09/11/2022 follow up and did not return call to reschedule. Will be discharged at this time.

## 2022-10-11 NOTE — Telephone Encounter (Signed)
 Patient called and stated that he had surgery on his back in July and would like a referral to a pain clinic.  Please advise

## 2022-10-12 NOTE — Telephone Encounter (Signed)
No answer, no voicemail set up.

## 2022-10-12 NOTE — Telephone Encounter (Signed)
 No answer, will try to call pt again later

## 2022-10-16 NOTE — Telephone Encounter (Signed)
 No answer

## 2022-11-08 ENCOUNTER — Inpatient Hospital Stay: Payer: PRIVATE HEALTH INSURANCE | Primary: Family

## 2022-11-08 ENCOUNTER — Inpatient Hospital Stay: Admit: 2022-11-08 | Payer: PRIVATE HEALTH INSURANCE | Primary: Family

## 2022-11-08 ENCOUNTER — Encounter

## 2022-11-08 DIAGNOSIS — M47816 Spondylosis without myelopathy or radiculopathy, lumbar region: Secondary | ICD-10-CM

## 2022-11-22 NOTE — Telephone Encounter (Signed)
Pt called and l/m on my vm stating that he had a referral to be seen in our office.  Called pt back, no answer. Informed pt that we don't have a referral here at our office and to call the referring provider and have the office fax the referral to [place]. Gave our fax # and return phone call # if he has any questions.

## 2023-01-08 ENCOUNTER — Encounter: Payer: PRIVATE HEALTH INSURANCE | Attending: Internal Medicine | Primary: Family

## 2023-01-08 ENCOUNTER — Encounter: Payer: PRIVATE HEALTH INSURANCE | Primary: Family

## 2023-02-07 ENCOUNTER — Inpatient Hospital Stay: Admit: 2023-02-07 | Discharge: 2023-02-07 | Payer: PRIVATE HEALTH INSURANCE | Primary: Family

## 2023-02-07 ENCOUNTER — Institutional Professional Consult (permissible substitution)
Admit: 2023-02-07 | Discharge: 2023-02-07 | Payer: PRIVATE HEALTH INSURANCE | Attending: Internal Medicine | Primary: Family

## 2023-02-07 ENCOUNTER — Encounter

## 2023-02-07 VITALS — BP 114/94 | HR 77 | Temp 97.70000°F | Resp 16 | Ht 65.0 in | Wt 173.4 lb

## 2023-02-07 DIAGNOSIS — C9110 Chronic lymphocytic leukemia of B-cell type not having achieved remission: Secondary | ICD-10-CM

## 2023-02-07 LAB — CBC WITH AUTO DIFFERENTIAL
Basophils %: 1 % (ref 0–1)
Basophils Absolute: 0.11 10*3/uL
Eosinophils %: 21 % — ABNORMAL HIGH (ref 0–3)
Eosinophils Absolute: 3.61 10*3/uL
Hematocrit: 32.1 % — ABNORMAL LOW (ref 42.0–52.0)
Hemoglobin: 10.6 g/dL — ABNORMAL LOW (ref 13.5–18.0)
Lymphocytes %: 33 % (ref 24–44)
Lymphocytes Absolute: 5.79 10*3/uL
MCH: 27.2 pg (ref 27.0–31.0)
MCHC: 33 g/dL (ref 32.0–36.0)
MCV: 82.5 fL (ref 78.0–100.0)
MPV: 8.8 fL (ref 7.5–11.1)
Monocytes %: 4 % (ref 0–4)
Monocytes Absolute: 0.74 10*3/uL
Neutrophils %: 41 % (ref 36–66)
Neutrophils Absolute: 7.13 10*3/uL
Platelets: 325 10*3/uL (ref 140–440)
RBC: 3.89 m/uL — ABNORMAL LOW (ref 4.60–6.20)
RDW: 17.2 % — ABNORMAL HIGH (ref 11.7–14.9)
WBC: 17.4 10*3/uL — ABNORMAL HIGH (ref 4.0–10.5)

## 2023-02-07 LAB — COMPREHENSIVE METABOLIC PANEL
ALT: 14 U/L (ref 10–40)
AST: 13 U/L — ABNORMAL LOW (ref 15–37)
Albumin/Globulin Ratio: 1.9 (ref 1.1–2.2)
Albumin: 4.3 g/dL (ref 3.4–5.0)
Alkaline Phosphatase: 64 U/L (ref 40–129)
Anion Gap: 10 mmol/L (ref 9–17)
BUN: 15 mg/dL (ref 7–20)
CO2: 26 mmol/L (ref 21–32)
Calcium: 9.4 mg/dL (ref 8.3–10.6)
Chloride: 100 mmol/L (ref 99–110)
Creatinine: 1.2 mg/dL (ref 0.8–1.3)
Est, Glom Filt Rate: 62 mL/min/{1.73_m2} (ref >60)
Glucose: 116 mg/dL — ABNORMAL HIGH (ref 74–99)
Potassium: 5 mmol/L (ref 3.5–5.1)
Sodium: 136 mmol/L (ref 136–145)
Total Bilirubin: 0.2 mg/dL (ref 0.0–1.0)
Total Protein: 6.5 g/dL (ref 6.4–8.2)

## 2023-02-07 LAB — LACTATE DEHYDROGENASE: LD: 133 U/L (ref 100–190)

## 2023-02-07 NOTE — Progress Notes (Signed)
MA Rooming Questions  Patient: Ricardo Foley  MRN: [id number]    Date: 02/07/2023        New Patient        5. Did the patient have a depression screening completed today? Yes    PHQ-9 Total Score: 0 (02/07/2023 11:40 AM)       PHQ-9 Given to (if applicable):               PHQ-9 Score (if applicable):                     []  Positive     []   Negative              Does question #9 need addressed (if applicable)                     []  Yes    []   No               Driscilla Moats, MA

## 2023-02-07 NOTE — Progress Notes (Signed)
Patient Name:  Ricardo Foley  Patient DOB:  21-Jun-1960  Patient MRN:  [id number]     Primary Oncologist: Joana Reamer, MD  Referring Provider: Frederich Balding, APRN     Date of Service: 02/07/2023      Reason for Consult:  CLL      Chief Complaint:    Chief Complaint   Patient presents with    New Patient       Encounter Diagnosis   Name Primary?    CLL (chronic lymphocytic leukemia) (HCC) Yes        HPI:   Initial Visit:06/11/2017 He arrived with his partner to the clinic today. Denied any frequent infections. Reported intermittent left chest pain associated shortness of breath on exertion. Denied any lower extremity edema. But reported ankle tenderness last week. Reported claudication pain in the calves. Reported that it feels like charley horses. Reported intermittent palpitations and dizziness. Reported it feels like "body rush" head to feet and also unbalanced gait. Reported dysphagia and odynophagia and sometimes feels neck swelling No fevers or any lymphadenopathy. Reported 10 pounds weight loss in the past 5 weeks and poor appetite. Reported abdominal pain which is lower no radiation especially after bowel movements and urination. Nausea and vomiting. Reported loose stools. Reported some nodular rash on the body. Denied any GU GI bleeding. Denied any night sweats. Reported that she had a biopsy of the thyroid nodule but not enough tissue.    05/11/2017 to the CT scan of the abdomen with IV contrast:IMPRESSION: No acute abdominopelvic abnormality detected.    05/30/17: CT scan of the abdomen pelvis with IV contrast:IMPRESSION: 1. No acute abdominopelvic abnormality. 2. Small hiatal hernia. 3. Minimal sigmoid diverticulosis.    05/31/2017 CT angiogram of the abdomen pelvis:IMPRESSION: 1. Suspected moderate to high-grade stenosis at the origin of the inferior mesenteric artery. The celiac and superior mesenteric arteries are widely patent. 2. High-grade short-segment stenosis at the origin of the right external  iliac artery. Beyond the stenosis, the right external iliac artery is diffusely narrowed by plaque. Bartosz, Strenger (AO#[id number]) Printed by [employer] [[id number]] Page 2 of 3 [email or web address][email or web address].org/epiccarelink/[email or web address]... 06/08/2017 3. 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    05/31/2017 echocardiogram:Findings Left Ventricle Left ventricular systolic function is normal. Ejection fraction is visually estimated at >55%. Mild concentric left ventricular hypertrophy. Left Atrium Essentially normal left atrium. Right Atrium Essentially normal right atrium. Right Ventricle Right ventricular systolic pressure of 37 mm Hg consistent with mild pulmonary hypertension. Aortic Valve Normal aortic valve structure and function. Mitral Valve Mild mitral regurgitation is present. Tricuspid Valve Mild tricuspid regurgitation. Pulmonic Valve Trivial pulmonic regurgitation present.  03/12/2017 CBC with WBC of 19.4 hemoglobin of 12.5 hematocrit of 39.2 MCV of 87 platelet count of 495 ANC of 7000 absolute lymphocyte count of 10,000.    05/11/2017 CBC WBC of 19.5 hemoglobin of 12.8 hematocrit of 41.4 MCV of 86.4 and platelet count of 45.7 absolute lymphocyte count of 7 8700.    06/02/2017 CBC WBC of 21 hemoglobin of 12.4 hematocrit of 39.4 MCV of 85.7 platelets of 4416 absolute lymphocyte count of 9200.    05/31/2017 transglutaminase IgA antibody 0. IgA 245.    06/11/2017 flow cytometry revealed monoclonal B-cell palpation with chronic lymphocytic leukemia/small lymphocytic lymphoma compromises approximately 20% of the nucleated cells. Cells were positive for CD5 CD19 and CD20 negative for CD10 and CD103 CD38 was negative was expressed and only 6% of the CD19 and CD5 positive cells  EGD and colonoscopy reports: 11/17/16, moderate or severe gastritis, but improved. (biopies were taken, but the path report said that there was no specimens). 08/18/16, EGD: severe gastritis  with erosions, 08/18/16, colonoscopy normal colon, ? Proctitis Biopsies on 7/27: mild chronic gastritis with NO H Pylori, rectal biopsies "no significant histopathologic changes"    07/02/17:FISH del 13  IgVH mutated 3.4%    07/11/17: ultrasound:    07/13/17: Ct chest: IMPRESSION:  1. No acute cardiopulmonary disease.  2. No pathologic lymphadenopathy.  3. Nonspecific 3 mm right upper lobe pulmonary nodule. Recommend repeat CT  in 12 months.  Past Medical History  Peripheral artery disease. Next present. Lumbar scoliosis. Hyperlipidemia. Hypertension. Irritable bowel syndrome. Diabetes mellitus. Depression. Thyroid nodule. Fatty liver. Chronic kidney disease. Pancreatitis.    09/08/17: Ct abdomen and pelvis:IMPRESSION: No acute findings within the abdomen or pelvis. No evidence of obstructive uropathy or appendicitis. Colonic diverticulosis with no acute features.      09/08/17: CT cervical spine:IMPRESSION: No acute abnormality of the cervical spine. Multilevel degenerative disc disease and facet arthropathy as outlined above.    09/17/17:MRI L spine:IMPRESSION: 1. No evidence of high grade spinal canal or foraminal narrowing in the lumbar spine. Minimal right foraminal disc bulge at L4-5 results in minor right foramina. Mild facet hypertrophy.    11/10/17: CT abdomen and pelvis:IMPRESSION: 1. No bowel obstruction. No free air or free fluid 2. No urolithiasis or hydronephrosis 3. Patchy pneumonitis in the lung 4. Atherosclerotic calcification    01/31/2018 CBC with WBC of 17.1 hemoglobin of 11.3 hematocrit 33.9 MCV of 79.4 and platelets of 358 LDH 121 CMP with sodium 135 potassium of 5.1 BUN of 14 creatinine of 1 calcium 9 glucose of 226 LFTs within normal limits     03/10/18: Ct abdomen and pelvis:  No acute inflammatory process or bowel obstruction.         Colonic diverticulosis.       03/18/18; Ct abdomen and pelvis:  Impression    1. Fluid-filled small bowel loops with mild mucosal enhancement, possibly a    mild  enteritis.  No evidence of obstruction.  The appendix is unremarkable.    2. Otherwise unremarkable CT of the abdomen and pelvis.            2/24/20CXR normal     06/07/18: Ct abdomen and pelvis:  1. No acute intra-abdominal abnormality to account for the patient's symptoms.    2. Moderate to severe atherosclerosis.       06/10/18: Ct abdomen and pelvis:  Patchy layering hyperdensity in the gastric lumen could be seen with contrast    extravasation in the setting of active bleeding, but no definite gastric    abnormality is identified.  However, the appearance could also be related to    ingested material.  Consider endoscopy.            11/07/18: Ct abdomen and pelvis:  1. No acute abnormality in the abdomen/pelvis.    2. Severe atherosclerosis.    3. Small hiatal hernia.            11/07/18; CXR normal    11/14/19: Venous dopplers:  1. The ankle brachial index suggests mild peripheral arterial disease on the   right and moderate peripheral arterial disease on the left.   2. Patent right fem-pop bypass graft.  No hemodynamically significant   stenosis in the arteries of the right lower extremity.      01/14/20; MRI CS:  Multilevel degenerative changes,  particularly involving the lower cervical   spine, as detailed above.      05/10/19: Ct abdomen and pelvis:  Mild sigmoid diverticulosis without acute diverticulitis.  Possible mild   infectious/inflammatory colitis.      11/06/2020 CT scan of the abdomen pelvis:   IMPRESSION:   1. There is liquid stool in the colon and distal small bowel as can   be seen in diarrheal illness. No evidence of obstruction or   inflammation.   2. The urinary bladder is mildly thick-walled. Recommend correlation   with urinalysis to exclude cystitis.     07/12/2022 MRI of the cervical spine with severe central canal stenosis at C3-C4 and C4-C5.  Severe multilevel disc degeneration and spondylosis with severe bilateral exit neural foraminal encroachment at C3-C4, C4-C5, C5-C6 and C6-C7 MRI of  the lumbar spine with multilevel facet degeneration.    07/20/2022 MRI of the thoracic spine with no acute findings in the thoracic spine    02/07/2023, he arrived alone to the clinic today.  Reported that he had neck surgery few months ago.  Surgeon putting few cages.  Continues to have pain.  Reported that he has left lower abdominal pain after donation and also bowel movements.  CT scan of the abdomen and pelvis was done in 2022 which did not reveal any pathology.  He is due for colonoscopy.  Stool is floppy.  No bleeding.  Reported nausea and emesis.  No weight loss actually gained weight.  Denied any chest pain reported increased shortness of breath lately.  No fever or cough.  Denied any drenching night sweats or lymphadenopathy.  No frequent infections.    Past Medical History:     Peripheral artery disease. Next present. Lumbar scoliosis. Hyperlipidemia. Hypertension. Irritable bowel syndrome. Diabetes mellitus. Depression. Thyroid nodule. Fatty liver. Chronic kidney disease. Pancreatitis.                                                              Past Surgery History:    Neck surgery                                                                    Social History:    Has 3 children. Smokes 1 pack/day for the past 42 years. Has sometimes also smoked 1-1/2 to 2 packs. Reported 1 24 case beers per weekend. Smokes marijuana.                                                                                                           Family History:     No history of any  leukemia lymphoma or any blood dyscrasias. Denied any malignancy in the family.                                                                                             No Known Allergies    Current Outpatient Medications on File Prior to Visit   Medication Sig Dispense Refill    traZODone (DESYREL) 150 MG tablet Take 1 tablet by mouth nightly      ondansetron (ZOFRAN) 4 MG tablet Take 1 tablet by mouth daily as needed for Nausea or Vomiting 30  tablet 0    carvedilol (COREG) 12.5 MG tablet Take 1 tablet by mouth 2 times daily      clopidogrel (PLAVIX) 75 MG tablet Take 1 tablet by mouth daily      dicyclomine (BENTYL) 10 MG capsule Take 1 capsule by mouth every 6 hours as needed (Bowel spasms) 20 capsule 0    aspirin 81 MG chewable tablet Take 1 tablet by mouth daily 90 tablet 3    DULoxetine (CYMBALTA) 60 MG extended release capsule Take 1 capsule by mouth daily      gabapentin (NEURONTIN) 400 MG capsule Take 1 capsule by mouth 3 times daily.      pantoprazole (PROTONIX) 40 MG tablet Take 1 tablet by mouth 2 times daily      tamsulosin (FLOMAX) 0.4 MG capsule Take 1 capsule by mouth daily      albuterol sulfate HFA 108 (90 Base) MCG/ACT inhaler Inhale 2 puffs into the lungs every 6 hours as needed for Wheezing      tiZANidine (ZANAFLEX) 4 MG tablet Take 1 tablet by mouth every 8 hours as needed      metFORMIN (GLUCOPHAGE) 1000 MG tablet Take 1 tablet by mouth 2 times daily (with meals)      hydrOXYzine (VISTARIL) 50 MG capsule Take 1 capsule by mouth 3 times daily as needed for Itching      atorvastatin (LIPITOR) 40 MG tablet Take 1 tablet by mouth nightly       No current facility-administered medications on file prior to visit.        Review of Systems:    Constitutional:  No weight loss, No fever, No chills, No night sweats.  Energy level poor  Eyes:  No diplopia, No transient or permanent loss of vision, No scotomata.  ENT / Mouth:  No epistaxis, No dysphagia, No hoarseness, No oral ulcers, No gingival bleeding.  No sore throat, No postnasal drip, No nasal drip, No mouth pain, No sinus pain, No tinnitus, Normal hearing.  Cardiovascular:  No chest pain, No palpitations, No syncope, No upper extremity edema, No lower extremity edema, No calf discomfort.  Respiratory: As per HPI  Gastrointestinal: As per HPI  Musculoskeletal: Back and neck pain  Skin:  No rash, No nodules, No pruritus, No lesions.  Neurologic:  No confusion, No seizures, No syncope, No  tremor, No speech change, No headache, No hiccups, No abnormal gait, No sensory changes, No weakness.  Hematologic:  No epistaxis, No gingival bleeding, No petechiae, No ecchymosis.  Lymphatic:  No lymphadenopathy, No lymphedema.  Allergy / Immunologic:  No eczema, No frequent mucous infections, No frequent respiratory infections, No recurrent urticarial, No frequent skin infections.     Vital Signs: BP (!) 114/94 (Site: Left Upper Arm, Position: Sitting, Cuff Size: Medium Adult)   Pulse 77   Temp 97.7 F (36.5 C) (Infrared)   Resp 16   Ht 1.651 m (5\' 5" )   Wt 78.7 kg (173 lb 6.4 oz)   SpO2 99%   BMI 28.86 kg/m      CONSTITUTIONAL: awake, alert, ambulates with the help of a cane  EYES: PEARL, No pallor or any icterus  ENT: ATNC  NECK: No JVD  HEMATOLOGIC/LYMPHATIC: no cervical, supraclavicular or axillary lymphadenopathy   LUNGS: CTAB  CARDIOVASCULAR: s1s2 rrr no murmurs  ABDOMEN: soft ttp left lower quadrant  NEUROLOGIC: GI  SKIN: No rash  EXTREMITIES: no LE edema bilaterally     Labs:  Hematology:  Lab Results   Component Value Date    WBC 19.0 (H) 07/27/2022    RBC 4.40 (L) 07/27/2022    HGB 12.4 (L) 07/27/2022    HCT 39.8 (L) 07/27/2022    MCV 90.5 07/27/2022    MCH 28.2 07/27/2022    MCHC 31.2 (L) 07/27/2022    RDW 17.6 (H) 07/27/2022    PLT 295 07/27/2022    MPV 9.6 07/27/2022    BANDSPCT 1 (L) 10/23/2018    BASOPCT 0.7 07/21/2022    LYMPHOPCT 49.1 (H) 07/21/2022    MONOPCT 5.2 (H) 07/21/2022    BANDABS 0.19 10/23/2018    EOSABS 1.5 07/21/2022    BASOSABS 0.1 07/21/2022    LYMPHSABS 5.9 07/21/2022    MONOSABS 0.6 07/21/2022    DIFFTYPE AUTOMATED DIFFERENTIAL 07/21/2022    ANISOCYTOSIS 1+ 07/20/2022    POLYCHROM 1+ 06/07/2018    WBCMORP NOT REPORTED 03/12/2017    PLTM PLATELETS APPEAR NORMAL 10/23/2018     No results found for: "ESR"  Chemistry:  Lab Results   Component Value Date    NA 133 (L) 07/27/2022    K 4.5 07/27/2022    CL 96 (L) 07/27/2022    CO2 22 07/27/2022    BUN 19 07/27/2022     CREATININE 1.0 07/27/2022    GLUCOSE 228 (H) 07/27/2022    CALCIUM 10.1 07/27/2022    BILITOT 0.2 07/25/2022    ALKPHOS 64 07/25/2022    AST 11 (L) 07/25/2022    ALT 11 07/25/2022    LABGLOM 86 07/27/2022    GFRAA >60 05/19/2019    PHOS 3.2 10/23/2018    MG 1.4 (L) 07/20/2022    POCGLU 221 (H) 07/27/2022     Lab Results   Component Value Date    LDH 162 05/19/2019     No results found for: "LD"  Lab Results   Component Value Date    TSHHS 2.370 07/21/2022     Immunology:  No results found for: "SPEP", "ALBUMINELP", "LABALPH", "LABBETA", "GAMGLOB"  No results found for: "KAPPAUVOL", "LAMBDAUVOL", "KLFLCR"  Lab Results   Component Value Date    B2M 2.3 07/02/2017    B2M  07/02/2017     (NOTE)  Performed by [facility],  [address], [place] [address] [phone]  [email or web address], Julio Delgado, MD, Lab. Director       Coagulation Panel:  Lab Results   Component Value Date    PROTIME 12.4 07/25/2022    INR 0.9 07/25/2022    APTT 34.6 07/25/2022    DDIMER 0.53 (H) 07/20/2022  Anemia Panel:  No results found for: "VITAMINB12", "FOLATE"  Tumor Markers:  Lab Results   Component Value Date    PSA 1.3 07/12/2022        Observations:  ECOG:  PHQ-9 Total Score: 0 (02/07/2023 11:40 AM)       Treatment History:  C1D1  C2D1  C3D1  C4D1  C5D1  C6D1      CARIS:  GAURDANT:    Assessment & Plan:                                                           LKG:MWNU cytometry consistent with CLL. No B symptoms.  No frequent infections. B2 M normal. LDH nomal. FISH with del 13. IgVH mutated. ZAP 70 not available,. All features with good prognosis per available data. CBC pending jan 2025,  No clinical indications for treatment. If labs normal, Recommend observation. Otherwise will order further investigation.     PAD: is being followed by CTS.     Thyroid nodule. Biopsy benign July 2019, Follows with Dr. Wandra Scot.    Abdominal pain: is being followed by GI, recommend colonoscopy     Continue other medical care.    Thank you for  letting us be part of the care and will follow along.    Discussed above findings and plan with him and he voiced understanding.  Answered all his questions.    Discussed healthy lifestyle including healthy diet, regular exercise as tolerated.  Also discussed importance of being up-to-date with age-appropriate screening tools.    Recommend follow-up with primary care physician and other specialists.    Please do not hesitate to contact us if you need further information.    Return to clinic 44M  or earlier if new Sx    This note is created with the assistance of a speech-recognition program. While intending to generate a document that accurately reflects the content of the visit, no guarantee can be provided that every mistake has been identified and corrected by editing.    Portions of this note are copied forward from previous clinic note.  Interval history, ROS, physical exam, assessment and plan has been reviewed and updated for accuracy by this provider.    Time Spent with patient,  for face to face, exam, education, discussing treatment options, reviewing imaging, reviewing labs, decision making, Pre-charting, counseling and documenting today's visit, >45   mins.     JC

## 2023-02-07 NOTE — Progress Notes (Deleted)
Patient Name: Ricardo Foley  Patient DOB: 1960/05/15  Patient MRN: [id number]     Primary Oncologist: Joana Reamer, MD  UJW:[id number] Regis Bill, APRN - CNP  GI: Dr Guss Bunde  Cardiology: Dr Tim Lair       Date of Service: 02/07/2023      Chief Complaint:   Chief Complaint   Patient presents with    New Patient        Active Problem list  No diagnosis found.         HPI:        06/11/2017 He arrived with his partner to the clinic today. Denied any frequent infections. Reported intermittent left chest pain associated shortness of breath on exertion. Denied any lower extremity edema. But reported ankle tenderness last week. Reported claudication pain in the calves. Reported that it feels like charley horses. Reported intermittent palpitations and dizziness. Reported it feels like "body rush" head to feet and also unbalanced gait. Reported dysphagia and odynophagia and sometimes feels neck swelling No fevers or any lymphadenopathy. Reported 10 pounds weight loss in the past 5 weeks and poor appetite. Reported abdominal pain which is lower no radiation especially after bowel movements and urination. Nausea and vomiting. Reported loose stools. Reported some nodular rash on the body. Denied any GU GI bleeding. Denied any night sweats. Reported that she had a biopsy of the thyroid nodule but not enough tissue.    05/11/2017 to the CT scan of the abdomen with IV contrast:IMPRESSION: No acute abdominopelvic abnormality detected.    05/30/17: CT scan of the abdomen pelvis with IV contrast:IMPRESSION: 1. No acute abdominopelvic abnormality. 2. Small hiatal hernia. 3. Minimal sigmoid diverticulosis.    05/31/2017 CT angiogram of the abdomen pelvis:IMPRESSION: 1. Suspected moderate to high-grade stenosis at the origin of the inferior mesenteric artery. The celiac and superior mesenteric arteries are widely patent. 2. High-grade short-segment stenosis at the origin of the right external iliac artery. Beyond the stenosis, the right external  iliac artery is diffusely narrowed by plaque. Brysyn, Howren (WG#[id number]) Printed by [employer] [[id number]] Page 2 of 3 [email or web address]://carelink.[email or web address].org/epiccarelink/[email or web address]/[email or web address]?ReportFr... 06/08/2017 3. 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    05/31/2017 echocardiogram:Findings Left Ventricle Left ventricular systolic function is normal. Ejection fraction is visually estimated at >55%. Mild concentric left ventricular hypertrophy. Left Atrium Essentially normal left atrium. Right Atrium Essentially normal right atrium. Right Ventricle Right ventricular systolic pressure of 37 mm Hg consistent with mild pulmonary hypertension. Aortic Valve Normal aortic valve structure and function. Mitral Valve Mild mitral regurgitation is present. Tricuspid Valve Mild tricuspid regurgitation. Pulmonic Valve Trivial pulmonic regurgitation present.  03/12/2017 CBC with WBC of 19.4 hemoglobin of 12.5 hematocrit of 39.2 MCV of 87 platelet count of 495 ANC of 7000 absolute lymphocyte count of 10,000.    05/11/2017 CBC WBC of 19.5 hemoglobin of 12.8 hematocrit of 41.4 MCV of 86.4 and platelet count of 45.7 absolute lymphocyte count of 7 8700.    06/02/2017 CBC WBC of 21 hemoglobin of 12.4 hematocrit of 39.4 MCV of 85.7 platelets of 4416 absolute lymphocyte count of 9200.    05/31/2017 transglutaminase IgA antibody 0. IgA 245.    06/11/2017 flow cytometry revealed monoclonal B-cell palpation with chronic lymphocytic leukemia/small lymphocytic lymphoma compromises approximately 20% of the nucleated cells. Cells were positive for CD5 CD19 and CD20 negative for CD10 and CD103 CD38 was negative was expressed and only 6% of the CD19 and CD5 positive cells    EGD and colonoscopy  reports: 11/17/16, moderate or severe gastritis, but improved. (biopies were taken, but the path report said that there was no specimens). 08/18/16, EGD: severe gastritis with erosions, 08/18/16, colonoscopy normal colon, ?  Proctitis Biopsies on 7/27: mild chronic gastritis with NO H Pylori, rectal biopsies "no significant histopathologic changes"    07/02/17:FISH del 13  IgVH mutated 3.4%    07/11/17: ultrasound:    07/13/17: Ct chest: IMPRESSION:  1. No acute cardiopulmonary disease.  2. No pathologic lymphadenopathy.  3. Nonspecific 3 mm right upper lobe pulmonary nodule. Recommend repeat CT  in 12 months.  Past Medical History  Peripheral artery disease. Next present. Lumbar scoliosis. Hyperlipidemia. Hypertension. Irritable bowel syndrome. Diabetes mellitus. Depression. Thyroid nodule. Fatty liver. Chronic kidney disease. Pancreatitis.    09/08/17: Ct abdomen and pelvis:IMPRESSION: No acute findings within the abdomen or pelvis. No evidence of obstructive uropathy or appendicitis. Colonic diverticulosis with no acute features.      09/08/17: CT cervical spine:IMPRESSION: No acute abnormality of the cervical spine. Multilevel degenerative disc disease and facet arthropathy as outlined above.    09/17/17:MRI L spine:IMPRESSION: 1. No evidence of high grade spinal canal or foraminal narrowing in the lumbar spine. Minimal right foraminal disc bulge at L4-5 results in minor right foramina. Mild facet hypertrophy.    11/10/17: CT abdomen and pelvis:IMPRESSION: 1. No bowel obstruction. No free air or free fluid 2. No urolithiasis or hydronephrosis 3. Patchy pneumonitis in the lung 4. Atherosclerotic calcification    01/31/2018 CBC with WBC of 17.1 hemoglobin of 11.3 hematocrit 33.9 MCV of 79.4 and platelets of 358 LDH 121 CMP with sodium 135 potassium of 5.1 BUN of 14 creatinine of 1 calcium 9 glucose of 226 LFTs within normal limits    03/10/18: Ct abdomen and pelvis:  No acute inflammatory process or bowel obstruction.         Colonic diverticulosis.      03/18/18; Ct abdomen and pelvis:  Impression    1. Fluid-filled small bowel loops with mild mucosal enhancement, possibly a    mild enteritis.  No evidence of obstruction.  The appendix is  unremarkable.    2. Otherwise unremarkable CT of the abdomen and pelvis.           2/24/20CXR normal    06/07/18: Ct abdomen and pelvis:  1. No acute intra-abdominal abnormality to account for the patient's symptoms.    2. Moderate to severe atherosclerosis.      06/10/18: Ct abdomen and pelvis:  Patchy layering hyperdensity in the gastric lumen could be seen with contrast    extravasation in the setting of active bleeding, but no definite gastric    abnormality is identified.  However, the appearance could also be related to    ingested material.  Consider endoscopy.           11/07/18: Ct abdomen and pelvis:  1. No acute abnormality in the abdomen/pelvis.    2. Severe atherosclerosis.    3. Small hiatal hernia.           11/07/18; CXR normal    11/14/19: Venous dopplers:  1. The ankle brachial index suggests mild peripheral arterial disease on the   right and moderate peripheral arterial disease on the left.   2. Patent right fem-pop bypass graft.  No hemodynamically significant   stenosis in the arteries of the right lower extremity.     01/14/20; MRI CS:  Multilevel degenerative changes, particularly involving the lower cervical   spine, as detailed above.  05/10/19: Ct abdomen and pelvis:  Mild sigmoid diverticulosis without acute diverticulitis.  Possible mild   infectious/inflammatory colitis.       Past Medical History  Peripheral artery disease. Next present. Lumbar scoliosis. Hyperlipidemia. Hypertension. Irritable bowel syndrome. Diabetes mellitus. Depression. Thyroid nodule. Fatty liver. Chronic kidney disease. Pancreatitis.    Surgical History  None        Allergies  No known medication allergies    Medications  Per Chart      Social History  Lives with friend. Has 3 children. Smokes 1 pack/day for the past 42 years. Has sometimes also smoked 1-1/2 to 2 packs. Reported 1 24 case beers per weekend. Smokes marijuana.      Family History  No history of any leukemia lymphoma or any blood dyscrasias. Denied  any malignancy in the family.    Interval History  02/07/2023: Arrived alone to the clinic today. Reported LLQ abdominal pain. Diagnosed with colitis. Has been prescribed antibiotics for that. No diarrhea. No Overt bleeding. No fever, but reported cold chills. No night sweats, lad. Appetite was poor sec to the pain, has lost 6 lbs. GI working with insurance for bentyl. No chest pain, increased sob, palpitation sir ant dizziness.     Review of Systems   Per interval history; otherwise 10 point ROS is negative              Vital Signs:  BP (!) 114/94 (Site: Left Upper Arm, Position: Sitting, Cuff Size: Medium Adult)   Pulse 77   Temp 97.7 F (36.5 C) (Infrared)   Resp 16   Ht 1.651 m (5\' 5" )   Wt 78.7 kg (173 lb 6.4 oz)   SpO2 99%   BMI 28.86 kg/m     Physical Exam:    CONSTITUTIONAL: awake, alert, overweight, ambulates with the help of a cane, in distress  EYES: No pallor or icterus   ENT: ATNC   NECK: No JVD, neck fullness but did not find any lad  HEMATOLOGIC/LYMPHATIC: no cervical, supraclavicular or axillary lymphadenopathy   LUNGS: bilateral wheeze  CARDIOVASCULAR: S1-S2 no murmurs   ABDOMEN:Soft nondistended ttt left lower quadrant, no hepatosplenomegaly bowel sounds positive  NEUROLOGIC: GI  SKIN: Diffuse lipomas in the trunk right upper extremity. RLE healed scar   EXTREMITIES: Mild lower extremity edema. Varicose veins      Labs:    Hematology:  Lab Results   Component Value Date    WBC 19.0 (H) 07/27/2022    RBC 4.40 (L) 07/27/2022    HGB 12.4 (L) 07/27/2022    HCT 39.8 (L) 07/27/2022    MCV 90.5 07/27/2022    MCH 28.2 07/27/2022    MCHC 31.2 (L) 07/27/2022    RDW 17.6 (H) 07/27/2022    PLT 295 07/27/2022    MPV 9.6 07/27/2022    BANDSPCT 1 (L) 10/23/2018    BASOPCT 0.7 07/21/2022    LYMPHOPCT 49.1 (H) 07/21/2022    MONOPCT 5.2 (H) 07/21/2022    BANDABS 0.19 10/23/2018    EOSABS 1.5 07/21/2022    BASOSABS 0.1 07/21/2022    LYMPHSABS 5.9 07/21/2022    MONOSABS 0.6 07/21/2022    DIFFTYPE AUTOMATED  DIFFERENTIAL 07/21/2022    ANISOCYTOSIS 1+ 07/20/2022    POLYCHROM 1+ 06/07/2018    WBCMORP NOT REPORTED 03/12/2017    PLTM PLATELETS APPEAR NORMAL 10/23/2018     No results found for: "ESR"      Chemistry:  Lab Results   Component Value Date  NA 133 (L) 07/27/2022    K 4.5 07/27/2022    CL 96 (L) 07/27/2022    CO2 22 07/27/2022    BUN 19 07/27/2022    CREATININE 1.0 07/27/2022    GLUCOSE 228 (H) 07/27/2022    CALCIUM 10.1 07/27/2022    BILITOT 0.2 07/25/2022    ALKPHOS 64 07/25/2022    AST 11 (L) 07/25/2022    ALT 11 07/25/2022    LABGLOM 86 07/27/2022    GFRAA >60 05/19/2019    PHOS 3.2 10/23/2018    MG 1.4 (L) 07/20/2022    POCGLU 221 (H) 07/27/2022     Lab Results   Component Value Date    LDH 162 05/19/2019     No results found for: "LD"  Lab Results   Component Value Date    TSHHS 2.370 07/21/2022       Immunology:  No results found for: "SPEP", "ALBUMINELP", "LABALPH", "LABBETA", "GAMGLOB"    No results found for: "KAPPAUVOL", "LAMBDAUVOL", "KLFLCR"  Lab Results   Component Value Date    B2M 2.3 07/02/2017    B2M  07/02/2017     (NOTE)  Performed by [facility],  [address] [place] [address] [phone]  [email or web address], Julio Delgado, MD, Lab. Director         Coagulation Panel:  Lab Results   Component Value Date    PROTIME 12.4 07/25/2022    INR 0.9 07/25/2022    APTT 34.6 07/25/2022    DDIMER 0.53 (H) 07/20/2022       Anemia Panel:  No results found for: "VITAMINB12", "FOLATE"    Tumor Markers:  Lab Results   Component Value Date    PSA 1.3 07/12/2022         Imaging: Reviewed     Pathology:Reviewed     Observations:  Performance Status: ECOG 0  Depression Status: PHQ-9 Total Score: 0 (02/07/2023 11:40 AM)          Assessment & Plan:  Absolute Leukocytosis QVZ:DGLO cytometry consistent with CLL. No B symptoms.  Normal immunoglobulin panel, no frequent infections. B2 M normal. LDH nomal. FISH with del 13. IgVH mutated. ZAP 70 not available,  at this point good prognosis per available data.  Leukocytosis most probably sec to colitis. No indications for treatment. Recommend observation.    Colitis: is on antibiotics.     PAD: is being followed by CTS.     Mild thrombocytosis: Resolved    Thyroid nodule. Biopsy benign July 2019, Follows with Dr. Wandra Scot.    Abdominal pain: is being followed by GI, colitis and IBS    Continue other medical care.    Discussed the above findings and plan with Mr. Malhi and he verbalized understanding.    Discussed smoking cessation and marijuana cessation. Discussed healthy lifestyle including regular exercise, diet control, weight loss. Also discussed importance of being up-to-date with age-appropriate screening tools.    Recommend follow-up with primary care physician and other specialists.    Return to clinic October 2021 or earlier if new symptoms.    JC           Electronically signed by Joana Reamer, MD on 02/07/2023 at 11:45 AM

## 2023-02-08 LAB — VITAMIN B12 & FOLATE
Folate: 4.6 ng/mL — ABNORMAL LOW (ref 4.8–24.2)
Vitamin B-12: 492 pg/mL (ref 211–911)

## 2023-02-08 LAB — IGG: IgG: 785 mg/dL (ref 700–1600)

## 2023-02-08 LAB — IRON AND TIBC
Iron % Saturation: 6 % — ABNORMAL LOW (ref 15–50)
Iron: 26 ug/dL — ABNORMAL LOW (ref 59–158)
TIBC: 413 ug/dL (ref 260–445)
UIBC: 387 ug/dL — ABNORMAL HIGH (ref 110–370)

## 2023-02-08 LAB — FERRITIN: Ferritin: 13 ng/mL — ABNORMAL LOW (ref 30–400)

## 2023-02-08 LAB — HAPTOGLOBIN: Haptoglobin: 259 mg/dL — ABNORMAL HIGH (ref 30–200)

## 2023-02-08 MED ORDER — FOLIC ACID 1 MG PO TABS
1 MG | ORAL_TABLET | Freq: Every day | ORAL | 1 refills | Status: DC
Start: 2023-02-08 — End: 2023-04-18

## 2023-02-08 NOTE — Progress Notes (Signed)
Lab results from 02/07/2023 reviewed. Folate low (4.6). RX for folic acid 1 mg daily e-scribed to [facility] on [address] per physician order. Called patient @ [phone] to notify. Voices understanding. No further needs identified at this time.

## 2023-02-15 LAB — MISCELLANEOUS SENDOUT 4: Test Name: 50640

## 2023-03-16 ENCOUNTER — Encounter

## 2023-03-28 ENCOUNTER — Inpatient Hospital Stay: Admit: 2023-03-28 | Payer: PRIVATE HEALTH INSURANCE | Attending: Orthopaedic Surgery of the Spine | Primary: Family

## 2023-03-28 DIAGNOSIS — M960 Pseudarthrosis after fusion or arthrodesis: Secondary | ICD-10-CM

## 2023-04-18 MED ORDER — FOLIC ACID 1 MG PO TABS
1 | ORAL_TABLET | Freq: Every day | ORAL | 1 refills | Status: AC
Start: 2023-04-18 — End: ?

## 2023-07-25 ENCOUNTER — Encounter

## 2023-08-08 ENCOUNTER — Encounter

## 2023-08-15 ENCOUNTER — Ambulatory Visit: Payer: Medicaid (Managed Care) | Primary: Family

## 2023-08-17 NOTE — Telephone Encounter (Signed)
 Called patient to reschedule their no show LAB appointment on 7/23 with LAB.  UNABLE TO LEAVE message for patient to return call to reschedule appointment.

## 2023-08-22 ENCOUNTER — Ambulatory Visit: Payer: Medicaid (Managed Care) | Primary: Family

## 2023-08-22 ENCOUNTER — Encounter: Payer: Medicaid (Managed Care) | Attending: Internal Medicine | Primary: Family

## 2023-08-22 NOTE — Telephone Encounter (Signed)
 Attempted to call pt regarding no show this morning with Dr.Challa, unable to reach pt or leave a msg. Sending front desk msg to mail no show letter.

## 2023-11-02 ENCOUNTER — Ambulatory Visit: Payer: Medicaid (Managed Care) | Primary: Family

## 2024-01-15 ENCOUNTER — Inpatient Hospital Stay: Admit: 2024-01-15 | Payer: Medicaid (Managed Care) | Primary: Family

## 2024-01-15 ENCOUNTER — Encounter

## 2024-01-15 DIAGNOSIS — R52 Pain, unspecified: Secondary | ICD-10-CM
